# Patient Record
Sex: Male | Born: 1975 | Race: White | Hispanic: No | Marital: Single | Smoking: Current every day smoker
Health system: Southern US, Community
[De-identification: ages and names within clinical notes are randomized; demographics above are authoritative.]

## PROBLEM LIST (undated history)

## (undated) DIAGNOSIS — B1920 Unspecified viral hepatitis C without hepatic coma: Secondary | ICD-10-CM

## (undated) DIAGNOSIS — N280 Ischemia and infarction of kidney: Secondary | ICD-10-CM

## (undated) DIAGNOSIS — I7410 Embolism and thrombosis of unspecified parts of aorta: Secondary | ICD-10-CM

## (undated) DIAGNOSIS — I330 Acute and subacute infective endocarditis: Secondary | ICD-10-CM

## (undated) DIAGNOSIS — B1910 Unspecified viral hepatitis B without hepatic coma: Secondary | ICD-10-CM

## (undated) DIAGNOSIS — D735 Infarction of spleen: Secondary | ICD-10-CM

## (undated) DIAGNOSIS — B955 Unspecified streptococcus as the cause of diseases classified elsewhere: Secondary | ICD-10-CM

## (undated) HISTORY — PX: APPENDECTOMY: SHX54

---

## 2015-04-30 ENCOUNTER — Emergency Department (HOSPITAL_COMMUNITY)
Admission: EM | Admit: 2015-04-30 | Discharge: 2015-04-30 | Disposition: A | Discharge status: Home / Self Care | Payer: Medicaid Other | Attending: Emergency Medicine | Admitting: Emergency Medicine

## 2015-04-30 ENCOUNTER — Encounter (HOSPITAL_COMMUNITY): Payer: Self-pay | Admitting: Emergency Medicine

## 2015-04-30 DIAGNOSIS — Z720 Tobacco use: Secondary | ICD-10-CM | POA: Diagnosis not present

## 2015-04-30 DIAGNOSIS — S3992XD Unspecified injury of lower back, subsequent encounter: Secondary | ICD-10-CM | POA: Insufficient documentation

## 2015-04-30 DIAGNOSIS — M545 Low back pain: Secondary | ICD-10-CM

## 2015-04-30 DIAGNOSIS — G8929 Other chronic pain: Secondary | ICD-10-CM | POA: Insufficient documentation

## 2015-04-30 DIAGNOSIS — W1839XD Other fall on same level, subsequent encounter: Secondary | ICD-10-CM | POA: Insufficient documentation

## 2015-04-30 DIAGNOSIS — Z8619 Personal history of other infectious and parasitic diseases: Secondary | ICD-10-CM | POA: Insufficient documentation

## 2015-04-30 DIAGNOSIS — M5440 Lumbago with sciatica, unspecified side: Secondary | ICD-10-CM | POA: Diagnosis not present

## 2015-04-30 DIAGNOSIS — W19XXXD Unspecified fall, subsequent encounter: Secondary | ICD-10-CM

## 2015-04-30 DIAGNOSIS — M549 Dorsalgia, unspecified: Secondary | ICD-10-CM

## 2015-04-30 HISTORY — DX: Unspecified viral hepatitis C without hepatic coma: B19.20

## 2015-04-30 MED ORDER — IBUPROFEN 800 MG PO TABS: 800.0000 mg | ORAL_TABLET | Freq: Three times a day (TID) | ORAL | Status: DC | PRN

## 2015-04-30 MED ORDER — KETOROLAC TROMETHAMINE 60 MG/2ML IM SOLN
60.0000 mg | Freq: Once | INTRAMUSCULAR | Status: AC
  Administered 2015-04-30: 60 mg via INTRAMUSCULAR
  Filled 2015-04-30: qty 2

## 2015-04-30 MED ORDER — METHOCARBAMOL 500 MG PO TABS: 500.0000 mg | ORAL_TABLET | Freq: Four times a day (QID) | ORAL | Status: DC | PRN

## 2015-04-30 MED ORDER — NAPROXEN 500 MG PO TABS: 500.0000 mg | ORAL_TABLET | Freq: Two times a day (BID) | ORAL | Status: DC

## 2015-04-30 NOTE — ED Notes (Signed)
Pt c/o right lower back pain after falling down stairs; pt sts chronic issues x 6 months

## 2015-04-30 NOTE — ED Notes (Signed)
Declined W/C at D/C and was escorted to lobby by RN. 

## 2015-04-30 NOTE — ED Notes (Signed)
Pt informed the medication toradol burns and recommended Pt receive the injection in G/V location instead of arm. Pt stated he did not like needles in his arm. Once again PT was recommended to receive injection in V/G region. Pt agreed to receive injection in V/G area.

## 2015-04-30 NOTE — ED Notes (Signed)
Patient states mechanical fall on Thursday, but states has chronic back pain.   Patient states he has had non stop pain for 6 months.  Patient states he was seen at [facility] and he had lumbar xrays done then.   Patient states he received roxicodone there, but he still can't sleep.

## 2015-04-30 NOTE — Discharge Instructions (Signed)
Read the information below.  Use the prescribed medication as directed.  Please discuss all new medications with your pharmacist.  You may return to the Emergency Department at any time for worsening condition or any new symptoms that concern you.   Do not take additional ibuprofen while taking naproxen.   If you develop fevers, loss of control of bowel or bladder, weakness or numbness in your legs, or are unable to walk, return to the ER for a recheck.    Back Pain, Adult Low back pain is very common. About 1 in 5 people have back pain.The cause of low back pain is rarely dangerous. The pain often gets better over time.About half of people with a sudden onset of back pain feel better in just 2 weeks. About 8 in 10 people feel better by 6 weeks.  CAUSES Some common causes of back pain include:  Strain of the muscles or ligaments supporting the spine.  Wear and tear (degeneration) of the spinal discs.  Arthritis.  Direct injury to the back. DIAGNOSIS Most of the time, the direct cause of low back pain is not known.However, back pain can be treated effectively even when the exact cause of the pain is unknown.Answering your caregiver's questions about your overall health and symptoms is one of the most accurate ways to make sure the cause of your pain is not dangerous. If your caregiver needs more information, he or she may order lab work or imaging tests (X-rays or MRIs).However, even if imaging tests show changes in your back, this usually does not require surgery. HOME CARE INSTRUCTIONS For many people, back pain returns.Since low back pain is rarely dangerous, it is often a condition that people can learn to [facility] their own.   Remain active. It is stressful on the back to sit or stand in one place. Do not sit, drive, or stand in one place for more than 30 minutes at a time. Take short walks on level surfaces as soon as pain allows.Try to increase the length of time you walk each  day.  Do not stay in bed.Resting more than 1 or 2 days can delay your recovery.  Do not avoid exercise or work.Your body is made to move.It is not dangerous to be active, even though your back may hurt.Your back will likely heal faster if you return to being active before your pain is gone.  Pay attention to your body when you bend and lift. Many people have less discomfortwhen lifting if they bend their knees, keep the load close to their bodies,and avoid twisting. Often, the most comfortable positions are those that put less stress on your recovering back.  Find a comfortable position to sleep. Use a firm mattress and lie on your side with your knees slightly bent. If you lie on your back, put a pillow under your knees.  Only take over-the-counter or prescription medicines as directed by your caregiver. Over-the-counter medicines to reduce pain and inflammation are often the most helpful.Your caregiver may prescribe muscle relaxant drugs.These medicines help dull your pain so you can more quickly return to your normal activities and healthy exercise.  Put ice on the injured area.  Put ice in a plastic bag.  Place a towel between your skin and the bag.  Leave the ice on for 15-20 minutes, 03-04 times a day for the first 2 to 3 days. After that, ice and heat may be alternated to reduce pain and spasms.  Ask your caregiver about trying  back exercises and gentle massage. This may be of some benefit.  Avoid feeling anxious or stressed.Stress increases muscle tension and can worsen back pain.It is important to recognize when you are anxious or stressed and learn ways to manage it.Exercise is a great option. SEEK MEDICAL CARE IF:  You have pain that is not relieved with rest or medicine.  You have pain that does not improve in 1 week.  You have new symptoms.  You are generally not feeling well. SEEK IMMEDIATE MEDICAL CARE IF:   You have pain that radiates from your back into  your legs.  You develop new bowel or bladder control problems.  You have unusual weakness or numbness in your arms or legs.  You develop nausea or vomiting.  You develop abdominal pain.  You feel faint. Document Released: 08/18/2005 Document Revised: 02/17/2012 Document Reviewed: 12/20/2013 [facility] Patient Information 2015 [place], [place]. This information is not intended to replace advice given to you by your health care provider. Make sure you discuss any questions you have with your health care provider.    Emergency Department Resource Guide 1) Find a Doctor and Pay Out of Pocket Although you won't have to find out who is covered by your insurance plan, it is a good idea to ask around and get recommendations. You will then need to call the office and see if the doctor you have chosen will accept you as a new patient and what types of options they offer for patients who are self-pay. Some doctors offer discounts or will set up payment plans for their patients who do not have insurance, but you will need to ask so you aren't surprised when you get to your appointment.  2) Contact Your Local Health Department Not all health departments have doctors that can see patients for sick visits, but many do, so it is worth a call to see if yours does. If you don't know where your local health department is, you can check in your phone book. The CDC also has a tool to help you locate your state's health department, and many state websites also have listings of all of their local health departments.  3) Find a Walk-in Clinic If your illness is not likely to be very severe or complicated, you may want to try a walk in clinic. These are popping up all over the country in pharmacies, drugstores, and shopping centers. They're usually staffed by nurse practitioners or physician assistants that have been trained to treat common illnesses and complaints. They're usually fairly quick and inexpensive. However,  if you have serious medical issues or chronic medical problems, these are probably not your best option.  No Primary Care Doctor: - Call Health Connect at  [phone] - they can help you locate a primary care doctor that  accepts your insurance, provides certain services, etc. - Physician Referral Service- [phone]  Chronic Pain Problems: Organization         Address  Phone   Notes  [facility]  [phone] Patients need to be referred by their primary care doctor.   Medication Assistance: Organization         Address  Phone   Notes  [facility] Medication [facility] [address] [place] [place], [place] [address] [phone] --Must be a resident of [facility] -- Must have NO insurance coverage whatsoever (no Medicaid/ Medicare, etc.) -- The pt. MUST have a primary care doctor that directs their care regularly and  follows them in the community   [facility]ist  [phone]   [employer]  [phone]    Agencies that provide inexpensive medical care: Organization         Address  Phone   Notes  [facility]  [phone]   [facility]    [phone]   [facility] [address] [place], [place] [address] [phone]   [facility] [address] [place] [place] [phone]   Planned Parenthood    [phone]   [facility]    [phone]   [facility]  [address], [place] Phone:  [phone], Fax:  [phone] Hours of Operation:  9 am - 6 pm, M-F.  Also accepts Medicaid/Medicare and self-pay.  [facility]  [address], [place] Phone: [phone], Fax: [phone]. Hours of Operation:  8:30 am - 5:30 pm, M-F.  Also accepts Medicaid and self-pay.  [facility] [place] [address] [place] Phone: [phone]   [facility] [address] Natasha Bence [place], [place] [phone], Ext. [phone] Mondays & Thursdays: 7-9 AM.  First 15 patients are seen on a first come, first serve basis.    Medicaid-accepting [facility] Providers:  Organization         Address  Phone   Notes  [facility] [address], [place] [phone] Also accepts self-pay patients.  [facility] [address] Laurell Josephs [place], [place]  [phone]   [facility] [address], [place] [phone]   [facility] [address] [place] [phone]   Renaye Rakers [address], [place]   [phone] Only accepts [place] Access [place] patients after they have their name applied to their card.   Self-Pay (no insurance) in [facility]:  Organization         Address  Phone   Notes  Sickle Cell Patients, [facility] Internal [facility] [address] [place], [place] [phone]   [facility] [address] [place], [place] [phone]   [facility] [place]  [address], [place] [phone]   [facility]/Dr. Osei-Bonsu  [address], [place] or [address], [place] [phone] Phone number for both [place] and [place] locations is the same.  [facility] [address] [place] [phone]   [facility] [address] [place] or [address] [phone]   [facility] [address] [place] [phone], phone; [phone], fax Sees patients 1st and 3rd Saturday of every month.  Must not qualify for public or private insurance (i.e. Medicaid, Medicare, [place] Health Choice, Veterans' Benefits)  Household income should be no more than 200% of the poverty level The clinic cannot treat you if you are pregnant or think you are  pregnant  Sexually transmitted diseases are not treated at the clinic.    Dental Care: Organization         Address  Phone  Notes  [facility] Department of [facility] [address] [place], [place] [phone] Accepts children up to age 23 who are  enrolled in Medicaid or [place] Health Choice; pregnant women with a Medicaid card; and children who have applied for Medicaid or [place] Health Choice, but were declined, whose parents can pay a reduced fee at time of service.  [facility] of [facility]  [address], [place] [phone] Accepts children up to age 39 who are enrolled in [place] or [place] Health Choice; pregnant women with a Medicaid card; and children who have applied for Medicaid or [place] Health Choice, but were declined, whose parents can pay a reduced fee at time of service.  [facility]  [address] [place], [place] [phone] Patients are seen by appointment only. Walk-ins are not accepted. [facility] will see patients 32 years of age and older. Monday - Tuesday (8am-5pm) Most Wednesdays (8:30-5pm) $30 per visit, cash only  [facility] Adult Dental Access PROGRAM  [address], [facility] [phone] Patients are seen by appointment only. Walk-ins are not accepted. [facility] will see patients 5 years of age and older. One Wednesday Evening (Monthly: Volunteer Based).  $30 per visit, cash only  [employer]  [phone] for adults; Children under age 89, call Graduate Pediatric Dentistry at [phone]. Children aged 44-14, please call [phone] to request a pediatric application.  Dental services are provided in all areas of dental care including fillings, crowns and bridges, complete and partial dentures, implants, gum treatment, root canals, and extractions. Preventive care is also provided. Treatment is provided to both adults and  children. Patients are selected via a lottery and there is often a waiting list.   [facility] [address], [place]  [phone] [email or web address]   [facility] [address] [place], [place] [phone], Ext. [phone] Second and Fourth Thursday of each month, opens at 6:30 AM; Clinic ends at 9 AM.  Patients are seen on a first-come first-served basis, and a limited number are seen during each clinic.   [facility]  [address] Ether Griffins [place], [place] [phone]   Eligibility Requirements You must have lived in [place], [place], or [place] counties for at least the last three months.   You cannot be eligible for state or federal sponsored [place], including [employer], [place], or [employer].   You generally cannot be eligible for healthcare insurance through your employer.    How to apply: Eligibility screenings are held every Tuesday and Wednesday afternoon from 1:00 pm until 4:00 pm. You do not need an appointment for the interview!  [facility] [address] [place], [place] [phone]   [facility] Health Department  [phone]   [facility]  [phone]   [facility] Health Department  [phone]    Behavioral Health Resources in the Community: Intensive Outpatient Programs Organization         Address  Phone  Notes  [facility] Services [address] [place], [place] [phone]   [facility] Outpatient [address], [place], [place] [phone]   [facility] [address] [place], [place]  [phone]   [facility] Mental Health [address]  [place], [place] [phone] or [phone]   Substance Abuse Resources Organization         Address  Phone  Notes  [facility] Services  [phone]   [facility]  [phone]  [facility]  [phone]    Floydene Flock  [phone]   Residential & Outpatient Substance Abuse Program  [phone]   Psychological Services Organization         Address  Phone  Notes  [facility] Behavioral Health  [phone]   [facility] Services  [phone]   [facility] Mental Health [address] [place] [phone] or [phone]    Mobile Crisis Teams Organization         Address  Phone  Notes  [facility], Mobile Crisis Care Unit  [phone]   [facility]  [address] [place], [place] [phone]   Doristine Locks [address] [place] [phone]    Self-Help/Support Groups Organization         Address  Phone             Notes  Mental Health Assoc. of  - variety of support groups  336- I7437963 Call for more information  Narcotics Anonymous (NA), Caring [facility] [address], [employer] Social Circle  2 meetings at this location   Statistician         Address  Phone  Notes  [facility] [address],    [place]  [phone]   [facility]  [address] [place] [address], [place], [place] [phone]   [facility] [address] [place], [place] [phone] Admissions: 8am-3pm M-F  [facility] [address],    [place], [place] [phone]   [facility] [address] [place],  [place], [place] [phone]   [facility] [address]  [place], [place] [phone]   [facility] - Intensive Outpatient [address]ure[address], [place], [place] [phone]   [facility] (Addiction Recovery Care [facility]oc.) [address] [place]  [place], [place] [phone] or [phone]   [facility] ([facility]) [address] [place], [place] [phone] Accepts Medicaid  [employer] [address]  [place] [phone] Substance Abuse/Addiction Treatment   [facility] Organization         Address  Phone  Notes  [facility]  [phone]   Angie Fava, PhD [address] Ervin Knack [employer], [place]   [phone] or 6696083858   [facility] Behavioral   [address] [place], [place] [phone]   [facility] [address], [place], [place] [phone] Insurance/Medicaid/sponsorship through [facility] [address]                                    [place], [place] [phone] Therapy/tele-psych/case  [facility] [address] [place], [place] [phone]    Dr. Lolly Mustache  [phone]   [facility]  [employer] [facility] Dept. 1) [address] [place] 2) [address], [place] 3)  [address], [place] [phone]  [phone]   [facility] Child Abuse Hotline 828-342-6656 or [phone] (After Hours)

## 2015-04-30 NOTE — ED Notes (Signed)
AS Pt was  Leaving the exam room Pt reports I feel worse than when I came in. Pt then reports I did not know that shot would burn. I feel swelling where I got the med. . On observation no swelling or change in color at injection site.

## 2015-04-30 NOTE — ED Provider Notes (Signed)
CSN: [id number]     Arrival date & time 04/30/15  1348 History  This chart was scribed for non-physician practitioner Trixie Dredge, PA-C working with Marily Memos, MD by Leone Payor, ED Scribe. This patient was seen in room TR06C/TR06C and the patient's care was started at 3:13 PM.    Chief Complaint  Patient presents with  . Back Pain   The history is provided by the patient and the spouse. No language interpreter was used.     HPI Comments: Marcus [name] is a 39 y.o. male who presents to the Emergency Department complaining of 6 months of chronic, constant right lower back pain that worsened after a fall 3 days ago. He reports a slip and fall, striking his lower back on the steps and falling down 5-6 steps. He denies LOC or head injury during the fall. He reports having a back injury in February 2016 while snowboarding which initially caused his back pain. He also reports intermittent numbness to lower extremities since the initial injury in February. He describes the pain as sharp and shooting with certain movements. He has been taking ibuprofen at home without relief. He was seen in the [place] ED 04/28/15 and had a negative lumbar xray; he was diagnosed with a contusion, given oxycodone and flexeril in the ED and discharged home with oxycodone 5 mg. He denies weakness.    Past Medical History  Diagnosis Date  . Hepatitis C    History reviewed. No pertinent past surgical history. History reviewed. No pertinent family history. Social History  Substance Use Topics  . Smoking status: Current Every Day Smoker  . Smokeless tobacco: None  . Alcohol Use: Yes    Review of Systems  Constitutional: Negative for fever.  Cardiovascular: Negative for leg swelling.  Musculoskeletal: Positive for back pain. Negative for gait problem and neck pain.  Skin: Negative for color change and wound.  Neurological: Positive for numbness. Negative for syncope, weakness and headaches.  Hematological: Does  not bruise/bleed easily.  Psychiatric/Behavioral: Negative for self-injury (accidental).   Allergies  Tylenol  Home Medications   Prior to Admission medications   Not on File   BP 120/71 mmHg  Pulse 98  Temp(Src) 98.8 F (37.1 C) (Oral)  Resp 18  SpO2 99% Physical Exam  Constitutional: He appears well-developed and well-nourished. No distress.  HENT:  Head: Normocephalic and atraumatic.  Neck: Neck supple.  Pulmonary/Chest: Effort normal.  Abdominal: Soft. He exhibits no distension. There is no tenderness. There is no rebound and no guarding.  Musculoskeletal: He exhibits tenderness.  Tenderness to lumbar spine and tenderness to right lumbar paraspinal muscles. SLR + No crepitus, or stepoffs. Lower extremities:  Strength 5/5, sensation intact, distal pulses intact.     Neurological: He is alert.  Skin: He is not diaphoretic.  Nursing note and vitals reviewed.   ED Course  Procedures (including critical care time)  DIAGNOSTIC STUDIES: Oxygen Saturation is 99% on RA, normal by my interpretation.    COORDINATION OF CARE: 3:20 PM Discussed treatment plan with pt at bedside and pt agreed to plan.   Labs Review Labs Reviewed - No data to display  Imaging Review No results found. I have personally reviewed and evaluated these images and lab results as part of my medical decision-making.   EKG Interpretation None      MDM   Final diagnoses:  Right low back pain, with sciatica presence unspecified  Fall, subsequent encounter  Chronic back pain greater than 3 months duration  Afebrile, nontoxic patient with exacerbation of chronic back pain after fall a few days ago.  No red flags with history or exam.  Neurovascularly intact.  Xray performed two days ago after fall.  Emergent imaging not indicated at this time.  Pt prescribed oxycodone by outside ED two days ago.  D/C home with motrin, robaxin, neurosurgery follow up.  PCP follow up.  Discussed result, findings,  treatment, and follow up  with patient.  Pt given return precautions.  Pt verbalizes understanding and agrees with plan.        I personally performed the services described in this documentation, which was scribed in my presence. The recorded information has been reviewed and is accurate.   Trixie Dredge, PA-C 04/30/15 1725  Marily Memos, MD 05/03/15 [phone]

## 2015-05-23 ENCOUNTER — Ambulatory Visit: Payer: Medicaid Other | Admitting: Rehabilitation

## 2016-07-02 ENCOUNTER — Encounter (HOSPITAL_COMMUNITY): Payer: Self-pay | Admitting: *Deleted

## 2016-07-02 ENCOUNTER — Inpatient Hospital Stay (HOSPITAL_COMMUNITY)
Admission: RE | Admit: 2016-07-02 | Discharge: 2016-07-09 | DRG: 881 | Disposition: A | Discharge status: Home / Self Care | Payer: Medicaid Other | Attending: Psychiatry | Admitting: Psychiatry

## 2016-07-02 ENCOUNTER — Encounter (HOSPITAL_COMMUNITY): Payer: Self-pay

## 2016-07-02 ENCOUNTER — Emergency Department (HOSPITAL_COMMUNITY)
Admission: EM | Admit: 2016-07-02 | Discharge: 2016-07-02 | Disposition: A | Discharge status: Other Acute Inpatient Hospital | Payer: Medicaid Other | Attending: Emergency Medicine | Admitting: Emergency Medicine

## 2016-07-02 ENCOUNTER — Encounter (HOSPITAL_COMMUNITY): Payer: Self-pay | Admitting: Behavioral Health

## 2016-07-02 DIAGNOSIS — F419 Anxiety disorder, unspecified: Secondary | ICD-10-CM | POA: Diagnosis present

## 2016-07-02 DIAGNOSIS — F1920 Other psychoactive substance dependence, uncomplicated: Secondary | ICD-10-CM | POA: Diagnosis present

## 2016-07-02 DIAGNOSIS — F17210 Nicotine dependence, cigarettes, uncomplicated: Secondary | ICD-10-CM | POA: Diagnosis not present

## 2016-07-02 DIAGNOSIS — G8929 Other chronic pain: Secondary | ICD-10-CM | POA: Diagnosis present

## 2016-07-02 DIAGNOSIS — F17200 Nicotine dependence, unspecified, uncomplicated: Secondary | ICD-10-CM | POA: Diagnosis present

## 2016-07-02 DIAGNOSIS — R110 Nausea: Secondary | ICD-10-CM

## 2016-07-02 DIAGNOSIS — Z79899 Other long term (current) drug therapy: Secondary | ICD-10-CM | POA: Diagnosis not present

## 2016-07-02 DIAGNOSIS — F329 Major depressive disorder, single episode, unspecified: Secondary | ICD-10-CM | POA: Diagnosis present

## 2016-07-02 DIAGNOSIS — R197 Diarrhea, unspecified: Secondary | ICD-10-CM | POA: Diagnosis present

## 2016-07-02 DIAGNOSIS — Z5181 Encounter for therapeutic drug level monitoring: Secondary | ICD-10-CM | POA: Insufficient documentation

## 2016-07-02 DIAGNOSIS — F1120 Opioid dependence, uncomplicated: Secondary | ICD-10-CM | POA: Diagnosis present

## 2016-07-02 DIAGNOSIS — F1590 Other stimulant use, unspecified, uncomplicated: Secondary | ICD-10-CM | POA: Diagnosis present

## 2016-07-02 DIAGNOSIS — R1110 Vomiting, unspecified: Secondary | ICD-10-CM

## 2016-07-02 DIAGNOSIS — L299 Pruritus, unspecified: Secondary | ICD-10-CM | POA: Diagnosis present

## 2016-07-02 DIAGNOSIS — R45851 Suicidal ideations: Secondary | ICD-10-CM | POA: Diagnosis present

## 2016-07-02 DIAGNOSIS — Z23 Encounter for immunization: Secondary | ICD-10-CM | POA: Diagnosis not present

## 2016-07-02 DIAGNOSIS — R112 Nausea with vomiting, unspecified: Secondary | ICD-10-CM | POA: Insufficient documentation

## 2016-07-02 DIAGNOSIS — B1910 Unspecified viral hepatitis B without hepatic coma: Secondary | ICD-10-CM | POA: Diagnosis present

## 2016-07-02 DIAGNOSIS — F11220 Opioid dependence with intoxication, uncomplicated: Secondary | ICD-10-CM | POA: Diagnosis not present

## 2016-07-02 LAB — COMPREHENSIVE METABOLIC PANEL
ALT: 26 U/L (ref 17–63)
AST: 21 U/L (ref 15–41)
Albumin: 3.7 g/dL (ref 3.5–5.0)
Alkaline Phosphatase: 69 U/L (ref 38–126)
Anion gap: 5 (ref 5–15)
BILIRUBIN TOTAL: 0.6 mg/dL (ref 0.3–1.2)
BUN: 7 mg/dL (ref 6–20)
CHLORIDE: 110 mmol/L (ref 101–111)
CO2: 24 mmol/L (ref 22–32)
Calcium: 8.8 mg/dL — ABNORMAL LOW (ref 8.9–10.3)
Creatinine, Ser: 0.8 mg/dL (ref 0.61–1.24)
Glucose, Bld: 94 mg/dL (ref 65–99)
POTASSIUM: 3.9 mmol/L (ref 3.5–5.1)
Sodium: 139 mmol/L (ref 135–145)
TOTAL PROTEIN: 6.8 g/dL (ref 6.5–8.1)

## 2016-07-02 LAB — RAPID URINE DRUG SCREEN, HOSP PERFORMED
Amphetamines: NOT DETECTED
Barbiturates: NOT DETECTED
Benzodiazepines: NOT DETECTED
Cocaine: NOT DETECTED
OPIATES: NOT DETECTED
Tetrahydrocannabinol: POSITIVE — AB

## 2016-07-02 LAB — CBC
HCT: 40.7 % (ref 39.0–52.0)
Hemoglobin: 13.7 g/dL (ref 13.0–17.0)
MCH: 30.6 pg (ref 26.0–34.0)
MCHC: 33.7 g/dL (ref 30.0–36.0)
MCV: 90.8 fL (ref 78.0–100.0)
PLATELETS: 282 10*3/uL (ref 150–400)
RBC: 4.48 MIL/uL (ref 4.22–5.81)
RDW: 13.6 % (ref 11.5–15.5)
WBC: 10.4 10*3/uL (ref 4.0–10.5)

## 2016-07-02 LAB — ETHANOL

## 2016-07-02 LAB — SALICYLATE LEVEL

## 2016-07-02 LAB — ACETAMINOPHEN LEVEL: Acetaminophen (Tylenol), Serum: <10 ug/mL — ABNORMAL LOW (ref 10–30)

## 2016-07-02 MED ORDER — ONDANSETRON HCL 4 MG PO TABS: 4.0000 mg | ORAL_TABLET | Freq: Four times a day (QID) | ORAL | 0 refills | Status: DC

## 2016-07-02 MED ORDER — HYDROXYZINE HCL 25 MG PO TABS: 25.0000 mg | ORAL_TABLET | Freq: Four times a day (QID) | ORAL | Status: DC | PRN

## 2016-07-02 MED ORDER — TRAZODONE HCL 50 MG PO TABS
50.0000 mg | ORAL_TABLET | Freq: Every evening | ORAL | Status: DC | PRN
  Administered 2016-07-02 – 2016-07-08 (×7): 50 mg via ORAL
  Filled 2016-07-02 (×2): qty 1
  Filled 2016-07-02: qty 21
  Filled 2016-07-02 (×5): qty 1

## 2016-07-02 MED ORDER — MAGNESIUM HYDROXIDE 400 MG/5ML PO SUSP: 30.0000 mL | Freq: Every day | ORAL | Status: DC | PRN

## 2016-07-02 MED ORDER — ALUM & MAG HYDROXIDE-SIMETH 200-200-20 MG/5ML PO SUSP
30.0000 mL | ORAL | Status: DC | PRN
  Administered 2016-07-04 (×2): 30 mL via ORAL
  Filled 2016-07-02 (×2): qty 30

## 2016-07-02 MED ORDER — NICOTINE POLACRILEX 2 MG MT GUM
2.0000 mg | CHEWING_GUM | OROMUCOSAL | Status: DC | PRN
  Administered 2016-07-02: 2 mg via ORAL

## 2016-07-02 NOTE — ED Notes (Signed)
EMTALA completed, [facility].  [place] here to transport patient.

## 2016-07-02 NOTE — B[facility] Counselor (Signed)
[facility]  Accepting is L. Arville CareParks, NP; attending is Dr. Mckinley Jewelates.

## 2016-07-02 NOTE — ED Triage Notes (Signed)
[facility]. Pt complains of abdominal pain and diarrhea for the past 48 hours. Pt states he has been eating food out of cans that were "questionable".   [facility] suicidal ideation that became worse recently since being laid off recently.

## 2016-07-02 NOTE — ED Notes (Signed)
Bed: WA27 Expected date:  Expected time:  Means of arrival:  Comments: 

## 2016-07-02 NOTE — Discharge Instructions (Signed)
There does not appear to be an emergent cause for your symptoms at this time. Your exam, labs are very reassuring. Please remember to drink at least 8, 8 ounce glasses of water per day or other electrolyte balance solution. You may take your Zofran as needed for nausea. Follow-up with your doctor next week as needed. Return to ED for any new or worsening symptoms as we discussed
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
## 2016-07-02 NOTE — H&P (Signed)
Behavioral Health Medical Screening Exam  Marcus [name] is an 40 y.o. male.  Total Time spent with patient: 20 minutes  Psychiatric Specialty Exam: Physical Exam  Constitutional: He is oriented to person, place, and time. He appears well-developed and well-nourished.  HENT:  Head: Normocephalic.  Eyes: Pupils are equal, round, and reactive to light.  Neck: Normal range of motion.  Cardiovascular: Normal rate, regular rhythm and normal heart sounds.   Respiratory: Effort normal and breath sounds normal.  GI: Soft. Bowel sounds are normal.  Musculoskeletal: Normal range of motion.  Neurological: He is alert and oriented to person, place, and time.  Skin: Skin is warm and dry.    Review of Systems  Psychiatric/Behavioral: Positive for depression, hallucinations, substance abuse (methamphetamines, heroin) and suicidal ideas. Negative for memory loss. The patient is nervous/anxious and has insomnia.     BP 121/78 (BP Location: Left Arm)   Pulse 100   Temp 98.5 F (36.9 C) (Oral)   Resp 18   SpO2 100%    General Appearance: Disheveled  Eye Contact:  Good  Speech:  Clear and Coherent and Normal Rate  Volume:  Decreased  Mood:  Anxious, Depressed, Hopeless and Worthless  Affect:  Congruent, Depressed and Flat  Thought Process:  Coherent, Goal Directed and Linear  Orientation:  Full (Time, Place, and Person)  Thought Content:  Logical and Hallucinations: Auditory Visual hears people talking to each other, see shadows  Suicidal Thoughts:  Yes.  without intent/plan two weeks ago formulated a plan to jump off a bridge    Homicidal Thoughts:  No  Memory:  Immediate;   Good Recent;   Fair Remote;   Fair  Judgement:  Fair  Insight:  Good  Psychomotor Activity:  Normal  Concentration: Concentration: Good and Attention Span: Good  Recall:  Good  Fund of Knowledge:Good  Language: Good  Akathisia:  No  Handed:  Right  AIMS (if indicated):     Assets:  Communication Skills Desire  for Improvement Financial Resources/Insurance Physical Health Resilience Vocational/Educational  Sleep:       Musculoskeletal: Strength & Muscle Tone: within normal limits Gait & Station: normal Patient leans: N/A BP 121/78 (BP Location: Left Arm)   Pulse 100   Temp 98.5 F (36.9 C) (Oral)   Resp 18   SpO2 100%    Recommendations:  Based on my evaluation the patient appears to have an emergency medical condition for which I recommend the patient be transferred to the emergency department for further evaluation.    Laveda AbbeLaurie Britton Parks, NP 07/02/2016, 2:53 PM

## 2016-07-02 NOTE — ED Notes (Signed)
Pt signed Consent for Transfer.

## 2016-07-02 NOTE — ED Notes (Signed)
Pelahm here [facility].  1 personal belonging bag given to Pelham.

## 2016-07-02 NOTE — ED Notes (Signed)
Bed: [id number] Expected date:  Expected time:  Means of arrival:  Comments: [facility]

## 2016-07-02 NOTE — ED Provider Notes (Signed)
[facility]-EMERGENCY DEPT Provider Note   CSN: [id number] Arrival date & time: 07/02/16  1511     History   Chief Complaint Chief Complaint  Patient presents with  . Suicidal  . Abdominal Pain  . Diarrhea    HPI Marcus [name] is a 40 y.o. male.  HPI patient here from behavioral health for evaluation of abdominal discomfort with nausea, vomiting and diarrhea. Patient reports he checked into behavioral yesterday for suicidal ideations. He denies any suicidal or homicidal ideations now, since he is feeling much better. No auditory or visual hallucinations. He reports intermittently over the past 3 months he has had intense nausea, vomiting with diarrhea. Most recent episode started 48 hours ago after eating "some questionable can food". He denies any fevers, chills, urinary symptoms, recent hospitalizations, travel or antibiotic use. No nausea or vomiting now.  Past Medical History:  Diagnosis Date  . Hepatitis C     There are no active problems to display for this patient.   History reviewed. No pertinent surgical history.     Home Medications    Prior to Admission medications   Not on File    Family History No family history on file.  Social History Social History  Substance Use Topics  . Smoking status: Current Every Day Smoker  . Smokeless tobacco: Never Used  . Alcohol use No     Allergies   Bee venom; Grapefruit oil; Naproxen; and Tylenol [acetaminophen]   Review of Systems Review of Systems A 10 point review of systems was completed and was negative except for pertinent positives and negatives as mentioned in the history of present illness    Physical Exam Updated Vital Signs BP 122/77 (BP Location: Right Arm)   Pulse 87   Temp 98.4 F (36.9 C) (Oral)   Resp 18   SpO2 99%   Physical Exam  Constitutional: He appears well-developed. No distress.  Awake, alert and nontoxic in appearance  HENT:  Head: Normocephalic and atraumatic.  Right Ear:  External ear normal.  Left Ear: External ear normal.  Mouth/Throat: Oropharynx is clear and moist.  Eyes: Conjunctivae and EOM are normal. Pupils are equal, round, and reactive to light.  Neck: Normal range of motion. No JVD present.  Cardiovascular: Normal rate, regular rhythm and normal heart sounds.   Pulmonary/Chest: Effort normal and breath sounds normal. No stridor.  Abdominal: Soft. There is no tenderness.  Musculoskeletal: Normal range of motion.  Neurological:  Awake, alert, cooperative and aware of situation; motor strength bilaterally; sensation normal to light touch bilaterally; no facial asymmetry; tongue midline; major cranial nerves appear intact;  baseline gait without new ataxia.  Skin: No rash noted. He is not diaphoretic.  Psychiatric: He has a normal mood and affect. His behavior is normal. Thought content normal.  Nursing note and vitals reviewed.    ED Treatments / Results  Labs (all labs ordered are listed, but only abnormal results are displayed) Labs Reviewed  COMPREHENSIVE METABOLIC PANEL  ETHANOL  SALICYLATE LEVEL  ACETAMINOPHEN LEVEL  CBC  RAPID URINE DRUG SCREEN, HOSP PERFORMED    EKG  EKG Interpretation None       Radiology No results found.  Procedures Procedures (including critical care time)  Medications Ordered in ED Medications - No data to display   Initial Impression / Assessment and Plan / ED Course  I have reviewed the triage vital signs and the nursing notes.  Pertinent labs & imaging results that were available during my care of the  patient were reviewed by me and considered in my medical decision making (see chart for details).  Clinical Course    Patient with nonspecific nausea, vomiting and diarrhea. No risk factors for C. difficile. Exam was reassuring. He is hemodynamically stable and afebrile. Overall appears well. Pending screening labs. Anticipate discharge with prescription for Zofran Discussed aggressive oral  rehydration at home. Return precautions discussed. Upon discharge, patient reports he is suicidal to nursing staff. Maintains no plan. TTS consulted. Patient is medically cleared for their evaluation. Disposition per their recommendations.  Final Clinical Impressions(s) / ED Diagnoses   Final diagnoses:  Nausea  Diarrhea, unspecified type  Non-intractable vomiting, presence of nausea not specified, unspecified vomiting type  Suicidal ideation    New Prescriptions New Prescriptions   No medications on file     Joycie PeekBenjamin Jarrett Albor, PA-C 07/02/16 1942    Rolland PorterMark James, MD 07/12/16 [phone]

## 2016-07-02 NOTE — BH Assessment (Signed)
Assessment Note  Marcus [name] is a 40 y.o. male who presented as a voluntary walk-in to [facility] with complaint of suicidal ideation and other depressive symptoms.  Pt reported as follows:  He stated that recently he has been in 'a downward spiral' of substance use (most recently heroin, meth, opioids, but "I've used everything") and depression.  He endorsed suicidal ideation without current plan (but recently had a plan to jump off a bridge), despondency, decreased grooming and staying in bed, isolation, anxiety, fair to poor sleep, and a history of auditory hallucination (hearing voices when he is alone) and visual hallucination (seeing shadows on the wall).  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  Pt endorsed IV use.  Pt also endorsed significant psychosocial stressors, including loss of work about a week ago, homelessness, and social isolation.  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  Pt said he is positive for Hepatitis C and has stomach issues.  During assessment, Pt was calm and cooperative.  He had good eye contact.  Pt was dressed in street clothes and appeared somewhat disheveled.  Pt endorsed suicidal ideation and other depressive symptoms (see above).  Pt also endorsed ongoing substance use (denied current withdrawal symptoms).  Pt denied homicidal ideation or self-injury.  Pt's speech was normal in rate, rhythm, and volume.  Memory and concentration were intact.  Thought processes were within normal limits; thought content was goal-oriented.  Insight and judgment were fair; impulse control was poor as evidenced by ongoing substance use and suicidal ideation.  Consulted with Irving Burton, NP, who recommended Pt be treated inpatient.  Pt to be sent to [facility] for medical clearance.   Diagnosis: Major Depressive Disorder, Recurrent, Severe; Polysubstance Use  Disorder; r/o Substance-induced mood disorder  Past Medical History:  Past Medical History:  Diagnosis Date  . Hepatitis C     No past surgical history on file.  Family History: No family history on file.  Social History:  reports that he has been smoking.  He does not have any smokeless tobacco history on file. He reports that he uses drugs, including Methamphetamines and Heroin, about 7 times per week. He reports that he does not drink alcohol.  Additional Social History:  Alcohol / Drug Use Pain Medications: See PTA Prescriptions: See PTA Over the Counter: See PTA History of alcohol / drug use?: Yes Substance #1 Name of Substance 1: Meth 1 - Frequency: Daily 1 - Duration: Ongoing 1 - Last Use / Amount: 06/23/16 Substance #2 Name of Substance 2: Heroin 2 - Amount (size/oz): Varied 2 - Frequency: Daily where possible 2 - Duration: Ongoing 2 - Last Use / Amount: 06/01/16 Substance #3 Name of Substance 3: Opioids  CIWA:   COWS:    Allergies:  Allergies  Allergen Reactions  . Naproxen Nausea And Vomiting    PT refused to take med .  Marland Kitchen Tylenol [Acetaminophen]     Home Medications:  (Not in a hospital admission)  OB/GYN Status:  No LMP for male patient.  General Assessment Data Location of Assessment: [facility] Assessment Services TTS Assessment: In system Is this a Tele or Face-to-Face Assessment?: Face-to-Face Is this an Initial Assessment or a Re-assessment for this encounter?: Initial Assessment Marital status: Single Is patient pregnant?: No Pregnancy Status: No Living Arrangements: Alone (No fixed address) Can pt return to current  living arrangement?: Yes Admission Status: Voluntary Is patient capable of signing voluntary admission?: Yes Referral Source: Self/Family/Friend  Medical Screening Exam [facility] Walk[facility] ONLY) Medical Exam completed: Yes  Crisis Care Plan Living Arrangements: Alone (No fixed address) Name of Psychiatrist: None Name of Therapist:  None  Education Status Is patient currently in school?: No  Risk to self with the past 6 months Suicidal Ideation: Yes-Currently Present Has patient been a risk to self within the past 6 months prior to admission? : No Suicidal Intent: No Has patient had any suicidal intent within the past 6 months prior to admission? : No Is patient at risk for suicide?: Yes Suicidal Plan?: No Has patient had any suicidal plan within the past 6 months prior to admission? : No Access to Means: No What has been your use of drugs/alcohol within the last 12 months?: Methamphetamine, heroin, opioids ("I've used everything") Previous Attempts/Gestures: No (But Pt endorsed very reckless behavior w/o caring if lived) Other Self Harm Risks: Drug use Intentional Self Injurious Behavior: None Family Suicide History: Unknown Recent stressful life event(s): Other (Comment) (Kicked out of home, fired) Persecutory voices/beliefs?: No Depression: Yes Depression Symptoms: Despondent, Insomnia, Isolating, Fatigue, Feeling worthless/self pity Substance abuse history and/or treatment for substance abuse?: Yes Suicide prevention information given to non-admitted patients: Not applicable  Risk to Others within the past 6 months Homicidal Ideation: No Does patient have any lifetime risk of violence toward others beyond the six months prior to admission? : No Thoughts of Harm to Others: No Current Homicidal Intent: No Current Homicidal Plan: No Access to Homicidal Means: No History of harm to others?: No Assessment of Violence: None Noted Does patient have access to weapons?: No Criminal Charges Pending?: No Does patient have a court date: No Is patient on probation?: Unknown  Psychosis Hallucinations: Auditory, Visual (Pt endorsed hx of auditory/visual hallucination) Delusions: None noted  Mental Status Report Appearance/Hygiene: Disheveled, Poor hygiene, Other (Comment) (Street clothes) Eye Contact:  Good Motor Activity: Freedom of movement, Unremarkable Speech: Unremarkable Level of Consciousness: Alert Mood: Sad Affect: Blunted Anxiety Level: Moderate Thought Processes: Coherent, Circumstantial Judgement: Partial Orientation: Person, Place, Time, Situation Obsessive Compulsive Thoughts/Behaviors: None  Cognitive Functioning Concentration: Good Memory: Remote Intact, Recent Intact IQ: Average Insight: Fair Impulse Control: Poor Appetite: Fair Sleep: Decreased Total Hours of Sleep: 4 Vegetative Symptoms: Staying in bed, Decreased grooming  ADLScreening [facility] Assessment Services) Patient's cognitive ability adequate to safely complete daily activities?: Yes Patient able to express need for assistance with ADLs?: Yes Independently performs ADLs?: Yes (appropriate for developmental age)  Prior Inpatient Therapy Prior Inpatient Therapy: No  Prior Outpatient Therapy Prior Outpatient Therapy: No Does patient have an ACCT team?: No Does patient have Intensive In-House Services?  : No Does patient have [facility] services? : No Does patient have P4CC services?: No  ADL Screening (condition at time of admission) Patient's cognitive ability adequate to safely complete daily activities?: Yes Is the patient deaf or have difficulty hearing?: No Does the patient have difficulty seeing, even when wearing glasses/contacts?: No Does the patient have difficulty concentrating, remembering, or making decisions?: No Patient able to express need for assistance with ADLs?: Yes Does the patient have difficulty dressing or bathing?: No Independently performs ADLs?: Yes (appropriate for developmental age) Does the patient have difficulty walking or climbing stairs?: No Weakness of Legs: None Weakness of Arms/Hands: None  Home Assistive Devices/Equipment Home Assistive Devices/Equipment: None  Therapy Consults (therapy consults require a physician order) PT Evaluation Needed: No OT  Evalulation Needed: No SLP Evaluation Needed: No Abuse/Neglect Assessment (Assessment to be complete while patient is alone) Physical Abuse: Denies Verbal Abuse: Denies Sexual Abuse: Denies Exploitation of patient/patient's resources: Denies Self-Neglect: Denies Values / Beliefs Cultural Requests During Hospitalization: None Spiritual Requests During Hospitalization: None Consults Spiritual Care Consult Needed: No Social Work Consult Needed: No Merchant navy officerAdvance Directives (For Healthcare) Does patient have an advance directive?: No Would patient like information on creating an advanced directive?: No - patient declined information    Additional Information 1:1 In Past 12 Months?: No CIRT Risk: No Elopement Risk: No Does patient have medical clearance?: Yes     Disposition:  Disposition Initial Assessment Completed for this Encounter: Yes Disposition of Patient: Inpatient treatment program Type of inpatient treatment program: Adult (Per L. Arville CareParks, NP, Pt meets inpt criteria pending med clearan)  On Site Evaluation by:   Reviewed with Physician:    Dorris FetchEugene T Sunita Demond 07/02/2016 2:49 PM

## 2016-07-02 NOTE — Tx Team (Signed)
Initial Treatment Plan 07/02/2016 10:21 PM Marcus ReidDana [name] ZOX:[id number]    PATIENT STRESSORS: Financial difficulties Marital or family conflict Occupational concerns Substance abuse   PATIENT STRENGTHS: Ability for insight Communication skills Motivation for treatment/growth Supportive family/friends   PATIENT IDENTIFIED PROBLEMS: At risk for suicide  Substance Abuse  "Social skills"  "My health"               DISCHARGE CRITERIA:  Ability to meet basic life and health needs Improved stabilization in mood, thinking, and/or behavior Motivation to continue treatment in a less acute level of care Need for constant or close observation no longer present Withdrawal symptoms are absent or subacute and managed without 24-hour nursing intervention  PRELIMINARY DISCHARGE PLAN: Attend 12-step recovery group Outpatient therapy  PATIENT/FAMILY INVOLVEMENT: This treatment plan has been presented to and reviewed with the patient, Marcus [name].  The patient and family have been given the opportunity to ask questions and make suggestions.  Carleene OverlieMiddleton, Simone Tuckey P, RN 07/02/2016, 10:21 PM

## 2016-07-03 MED ORDER — HYDROXYZINE HCL 25 MG PO TABS
25.0000 mg | ORAL_TABLET | ORAL | Status: DC | PRN
  Administered 2016-07-03 – 2016-07-05 (×6): 25 mg via ORAL
  Filled 2016-07-03 (×6): qty 1

## 2016-07-03 MED ORDER — NICOTINE 21 MG/24HR TD PT24
21.0000 mg | MEDICATED_PATCH | Freq: Every day | TRANSDERMAL | Status: DC
  Administered 2016-07-03 – 2016-07-09 (×7): 21 mg via TRANSDERMAL
  Filled 2016-07-03 (×8): qty 1

## 2016-07-03 MED ORDER — IBUPROFEN 400 MG PO TABS
400.0000 mg | ORAL_TABLET | Freq: Four times a day (QID) | ORAL | Status: DC | PRN
  Administered 2016-07-03 – 2016-07-09 (×14): 400 mg via ORAL
  Filled 2016-07-03 (×13): qty 1

## 2016-07-03 MED ORDER — INFLUENZA VAC SPLIT QUAD 0.5 ML IM SUSY
0.5000 mL | PREFILLED_SYRINGE | INTRAMUSCULAR | Status: AC
  Administered 2016-07-06: 0.5 mL via INTRAMUSCULAR
  Filled 2016-07-03: qty 0.5

## 2016-07-03 NOTE — Progress Notes (Signed)
Patient ID: Marcus ReidDana Nethery, male   DOB: July 24, 1976, 40 y.o.   MRN: [id number]  Pt currently presents with a flat affect and cooperative behavior. Pt reports to writer that their goal is to "go to bed early." Pt attends group tonight. Pt reports good sleep with current medication regimen. Pt states that he has had increased anxiety today but that the medications have been effective.   Pt provided with medications per providers orders. Pt's labs and vitals were monitored throughout the night. Pt supported emotionally and encouraged to express concerns and questions. Pt educated on medications and anxiety.   Pt's safety ensured with 15 minute and environmental checks. Pt endorses some AVH today, does not specify. Pt currently denies SI/HI. Pt verbally agrees to seek staff if SI/HI occurs and to consult with staff before acting on any harmful thoughts. Will continue POC.

## 2016-07-03 NOTE — Progress Notes (Signed)
DAR NOTE: Pt present with flat affect and depressed mood in the unit. Pt has been isolating himself and has been bed most of the time. Pt complained of headache, took all his meds as scheduled. As per self inventory, pt had a good night sleep, good appetite, low energy, and good concentration. Pt rate depression at 6, hopeless ness at 4, and anxiety a 5. Pt's safety ensured with 15 minute and environmental checks. Pt currently denies SI/HI and A/V hallucinations. Pt verbally agrees to seek staff if SI/HI or A/VH occurs and to consult with staff before acting on these thoughts. Will continue POC.

## 2016-07-03 NOTE — [facility] Group Notes (Signed)
[facility] LCSW Group Therapy  07/03/2016 3:48 PM  Type of Therapy:  Group Therapy  Participation Level:  Active  Participation Quality:  Attentive  Affect:  Blunted  Cognitive:  Alert  Insight:  Engaged  Engagement in Therapy:  Engaged  Modes of Intervention:  Confrontation, Discussion, Education, Exploration, Problem-solving, Rapport Building, Socialization and Support  Summary of Progress/Problems: Emotion Regulation: This group focused on both positive and negative emotion identification and allowed group members to process ways to identify feelings, regulate negative emotions, and find healthy ways to manage internal/external emotions. Group members were asked to reflect on a time when their reaction to an emotion led to a negative outcome and explored how alternative responses using emotion regulation would have benefited them. Group members were also asked to discuss a time when emotion regulation was utilized when a negative emotion was experienced. Marcus [name] was attentive and engaged during today's processing group.He shared that he struggles with anxiety. "I use drugs to help me cope with it but I've lost so much in the that process." Marcus [name] shared that he wants a combination of effective coping skills and medication to help.   Ledell PeoplesHeather N Smart 07/03/2016, 3:48 PM
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
## 2016-07-03 NOTE — Tx Team (Signed)
Interdisciplinary Treatment and Diagnostic Plan Update  07/03/2016 Time of Session: 9:30AM Lorrene ReidDana Penafiel MRN: [id number]  Principal Diagnosis: Polysubstance dependence Secondary Diagnoses: Active Problems:   Polysubstance dependence including opioid type drug without complication, episodic abuse ([facility])   Current Medications:  Current Facility-Administered Medications  Medication Dose Route Frequency Provider Last Rate Last Dose  . alum & mag hydroxide-simeth (MAALOX/MYLANTA) 200-200-20 MG/5ML suspension 30 mL  30 mL Oral Q4H PRN Laveda AbbeLaurie Britton Parks, NP      . hydrOXYzine (ATARAX/VISTARIL) tablet 25 mg  25 mg Oral Q6H PRN Laveda AbbeLaurie Britton Parks, NP      . magnesium hydroxide (MILK OF MAGNESIA) suspension 30 mL  30 mL Oral Daily PRN Laveda AbbeLaurie Britton Parks, NP      . nicotine (NICODERM CQ - dosed in mg/24 hours) patch 21 mg  21 mg Transdermal Daily Kerry HoughSpencer E Simon, PA-C   21 mg at 07/03/16 1037  . traZODone (DESYREL) tablet 50 mg  50 mg Oral QHS PRN Laveda AbbeLaurie Britton Parks, NP   50 mg at 07/02/16 2250   PTA Medications: No prescriptions prior to admission.    Patient Stressors: Financial difficulties Marital or family conflict Occupational concerns Substance abuse  Patient Strengths: Ability for Warden/rangerinsight Communication skills Motivation for treatment/growth Supportive family/friends  Treatment Modalities: Medication Management, Group therapy, Case management,  1 to 1 session with clinician, Psychoeducation, Recreational therapy.   Physician Treatment Plan for Primary Diagnosis: Polysubstance dependence Long Term Goal(s):     Short Term Goals:    Medication Management: Evaluate patient's response, side effects, and tolerance of medication regimen.  Therapeutic Interventions: 1 to 1 sessions, Unit Group sessions and Medication administration.  Evaluation of Outcomes: Progressing  Physician Treatment Plan for Secondary Diagnosis: Active Problems:   Polysubstance dependence including  opioid type drug without complication, episodic abuse ([facility])  Long Term Goal(s):     Short Term Goals:       Medication Management: Evaluate patient's response, side effects, and tolerance of medication regimen.  Therapeutic Interventions: 1 to 1 sessions, Unit Group sessions and Medication administration.  Evaluation of Outcomes: Progressing   RN Treatment Plan for Primary Diagnosis: Polysubstance dependence Long Term Goal(s): Knowledge of disease and therapeutic regimen to maintain health will improve  Short Term Goals: Ability to remain free from injury will improve, Ability to disclose and discuss suicidal ideas and Ability to identify and develop effective coping behaviors will improve  Medication Management: RN will administer medications as ordered by provider, will assess and evaluate patient's response and provide education to patient for prescribed medication. RN will report any adverse and/or side effects to prescribing provider.  Therapeutic Interventions: 1 on 1 counseling sessions, Psychoeducation, Medication administration, Evaluate responses to treatment, Monitor vital signs and CBGs as ordered, Perform/monitor CIWA, COWS, AIMS and Fall Risk screenings as ordered, Perform wound care treatments as ordered.  Evaluation of Outcomes: Progressing   LCSW Treatment Plan for Primary Diagnosis: Polysubstance dependence Long Term Goal(s): Safe transition to appropriate next level of care at discharge, Engage patient in therapeutic group addressing interpersonal concerns.  Short Term Goals: Engage patient in aftercare planning with referrals and resources, Facilitate patient progression through stages of change regarding substance use diagnoses and concerns and Identify triggers associated with mental health/substance abuse issues  Therapeutic Interventions: Assess for all discharge needs, 1 to 1 time with Social worker, Explore available resources and support systems, Assess for  adequacy in community support network, Educate family and significant other(s) on suicide prevention, Complete Psychosocial Assessment, Interpersonal group  therapy.  Evaluation of Outcomes: Progressing   Progress in Treatment: Attending groups: No. Participating in groups: No. new to unit. Continuing to assess.  Taking medication as prescribed: Yes. Toleration medication: Yes. Family/Significant other contact made: No, will contact:  family member if patient consents Patient understands diagnosis: Yes. Discussing patient identified problems/goals with staff: Yes. Medical problems stabilized or resolved: Yes. Denies suicidal/homicidal ideation: Yes, per self report.  Issues/concerns per patient self-inventory: No.  Other: n/a   New problem(s) identified: No, Describe:  n/a  New Short Term/Long Term Goal(s): medication stabilization, detox, referral to appropriate aftercare.   Discharge Plan or Barriers: CSW assessing for appropriate referrals. No current providers per pt.   Reason for Continuation of Hospitalization: Depression Medication stabilization Withdrawal symptoms  Estimated Length of Stay: 3-5 days   Attendees: Patient: 07/03/2016 2:34 PM  Physician: Dr. Vallery RidgeElizabeth Oates MD 07/03/2016 2:34 PM  Nursing: Ralph LeydenJan, Jane RN 07/03/2016 2:34 PM  RN Care Manager:Jennifer Chestine Sporelark CM 07/03/2016 2:34 PM  Social Worker: Trula SladeHeather Smart, LCSW 07/03/2016 2:34 PM  Recreational Therapist:  07/03/2016 2:34 PM  Other:  07/03/2016 2:34 PM  Other:  07/03/2016 2:34 PM  Other: 07/03/2016 2:34 PM    Scribe for Treatment Team: Ledell PeoplesHeather N Smart, LCSW 07/03/2016 2:34 PM

## 2016-07-03 NOTE — Progress Notes (Addendum)
Patient ID: Marcus [name], male [name]  DOB: 1976-04-11, 40 y.o.   MRN: [id number]    Report accepted from admitting nurse, Otto Herbreka RN. Pt currently presents with a blunted affect and depressed behavior. Pt supported emotionally and encouraged to express concerns and questions. Pt's safety ensured with 15 minute and environmental checks. Pt currently denies SI/HI and A/V hallucinations. Pt verbally agrees to seek staff if SI/HI or A/VH occurs and to consult with staff before acting on any harmful thoughts. Pt given medications, see MAR. Pt reports generalized pain with a concentration in his lower back 8/10. Refuses any interventions. Will continue POC.  Pt denies being allergic to Naproxen.
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
## 2016-07-03 NOTE — [facility] Suicide Risk Assessment (Signed)
[facility] INPATIENT:  Family/Significant Other Suicide Prevention Education  Suicide Prevention Education:  Patient Refusal for Family/Significant Other Suicide Prevention Education: The patient Lorrene ReidDana Biskup has refused to provide written consent for family/significant other to be provided Family/Significant Other Suicide Prevention Education during admission and/or prior to discharge.  Physician notified.  Baldo DaubJolan Bran Aldridge 07/03/2016, 11:07 AM
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
## 2016-07-03 NOTE — [facility] Group Notes (Signed)
[facility] Group Notes:  (Nursing/MHT/Case Management/Adjunct)  Date:  07/03/2016  Time:  4:03 PM   Type of Therapy:  Psychoeducational Skills  Participation Level:  Did Not Attend  Participation Quality:  DID NOT ATTEND  Affect:  DID NOT ATTEND  Cognitive:  DID NOT ATTEND  Insight:  None  Engagement in Group:  DID NOT ATTEND  Modes of Intervention:  DID NOT ATTEND  Summary of Progress/Problems: Pt did not attend patient self inventory group.   Mareta Chesnut O Lokelani Lutes 07/03/2016, 4:03 PM 

## 2016-07-04 MED ORDER — ONDANSETRON 4 MG PO TBDP
4.0000 mg | ORAL_TABLET | Freq: Three times a day (TID) | ORAL | Status: DC | PRN
  Administered 2016-07-04: 4 mg via ORAL
  Filled 2016-07-04: qty 1

## 2016-07-04 NOTE — Progress Notes (Signed)
D Marcus [name] has been pretty much uncomfortable most of the day today. To begin with he demonstrates a depressed, flat and blunted affect. He says " I guess I'm ok..ibuprofen don't really know...." e completes his daily assessment and on it he writes he deneis SI today and he rates his depression, hopelessness and  Anxiety  " 5/3/8", respectively. AHe has endorsed multiple complaints of abdominal discomfort / distress /  Nausea and heartburn. When questioned by this Clinical research associatewriter, he reports he has been experiencing these symtoms for " over 2 months now", he deneis knowing the etiology and states " guess I'm going to have  ti have it looked at when I lleave". A HE is seen watching TV in the day room. He sleeps off and on through out the day  And is lowly motivated and / or engaged in his recovery. R Safety in place.

## 2016-07-04 NOTE — Progress Notes (Signed)
Pt attended the evening AA speaker meeting. Pt was engaged and participated. 07/04/16 10:01 PM Ashden Sonnenberg, Iziah C, NT 

## 2016-07-04 NOTE — Progress Notes (Signed)
[facility] MD Progress Note  07/04/2016 1:22 PM  Patient Active Problem List   Diagnosis Date Noted  . Polysubstance dependence including opioid type drug without complication, episodic abuse ([place]) 07/02/2016    Diagnosis: Polysubstance use disorder  Subjective: Patient reports stomach is a bit upset and will take Maalox today denies any current suicidal or homicidal ideation, plan or intent denies any psychotic symptoms. Plan is to monitor patient over the weekend for stability and patient is in agreement with this plan.  Objective: Well-developed well-nourished slightly disheveled man in no apparent distress pleasant and appropriate speech and motor within normal limits mood is "alright" affect is unremarkable thought processes linear and goal-directed thought content denies current suicidal or homicidal ideation, plan or intent, alert and oriented 3 insight and judgment are somewhat limited IQ appears in average range         Current Facility-Administered Medications (Analgesics):  .  ibuprofen (ADVIL,MOTRIN) tablet 400 mg     Current Facility-Administered Medications (Other):  .  alum & mag hydroxide-simeth (MAALOX/MYLANTA) 200-200-20 MG/5ML suspension 30 mL .  hydrOXYzine (ATARAX/VISTARIL) tablet 25 mg .  Influenza vac split quadrivalent PF (FLUARIX) injection 0.5 mL .  magnesium hydroxide (MILK OF MAGNESIA) suspension 30 mL .  nicotine (NICODERM CQ - dosed in mg/24 hours) patch 21 mg .  traZODone (DESYREL) tablet 50 mg  No current outpatient prescriptions on file.  Vital Signs:Blood pressure 108/69, pulse 92, temperature 97.8 F (36.6 C), temperature source Oral, resp. rate 20, height _0  (1.803 m), weight 70.8 kg (156 lb), SpO2 100 %.    Lab Results:  Results for orders placed or performed during the hospital encounter of 07/02/16 (from the past 48 hour(s))  Comprehensive metabolic panel     Status: Abnormal   Collection Time: 07/02/16  4:16 PM  Result Value Ref  Range   Sodium 139 135 - 145 mmol/L   Potassium 3.9 3.5 - 5.1 mmol/L   Chloride 110 101 - 111 mmol/L   CO2 24 22 - 32 mmol/L   Glucose, Bld 94 65 - 99 mg/dL   BUN 7 6 - 20 mg/dL   Creatinine, Ser 0.80 0.61 - 1.24 mg/dL   Calcium 8.8 (L) 8.9 - 10.3 mg/dL   Total Protein 6.8 6.5 - 8.1 g/dL   Albumin 3.7 3.5 - 5.0 g/dL   AST 21 15 - 41 U/L   ALT 26 17 - 63 U/L   Alkaline Phosphatase 69 38 - 126 U/L   Total Bilirubin 0.6 0.3 - 1.2 mg/dL   GFR calc non Af Amer >60 >60 mL/min   GFR calc Af Amer >60 >60 mL/min    Comment: (NOTE) The eGFR has been calculated using the CKD EPI equation. This calculation has not been validated in all clinical situations. eGFR's persistently <60 mL/min signify possible Chronic Kidney Disease.    Anion gap 5 5 - 15  Ethanol     Status: None   Collection Time: 07/02/16  4:16 PM  Result Value Ref Range   Alcohol, Ethyl (B) <5 <5 mg/dL    Comment:        LOWEST DETECTABLE LIMIT FOR SERUM ALCOHOL IS 5 mg/dL FOR MEDICAL PURPOSES ONLY   Salicylate level     Status: None   Collection Time: 07/02/16  4:16 PM  Result Value Ref Range   Salicylate Lvl <7.8 2.8 - 30.0 mg/dL  Acetaminophen level     Status: Abnormal   Collection Time: 07/02/16  4:16 PM  Result  Value Ref Range   Acetaminophen (Tylenol), Serum <10 (L) 10 - 30 ug/mL    Comment:        THERAPEUTIC CONCENTRATIONS VARY SIGNIFICANTLY. A RANGE OF 10-30 ug/mL MAY BE AN EFFECTIVE CONCENTRATION FOR MANY PATIENTS. HOWEVER, SOME ARE BEST TREATED AT CONCENTRATIONS OUTSIDE THIS RANGE. ACETAMINOPHEN CONCENTRATIONS >150 ug/mL AT 4 HOURS AFTER INGESTION AND >50 ug/mL AT 12 HOURS AFTER INGESTION ARE OFTEN ASSOCIATED WITH TOXIC REACTIONS.   cbc     Status: None   Collection Time: 07/02/16  4:16 PM  Result Value Ref Range   WBC 10.4 4.0 - 10.5 K/uL   RBC 4.48 4.22 - 5.81 MIL/uL   Hemoglobin 13.7 13.0 - 17.0 g/dL   HCT 40.7 39.0 - 52.0 %   MCV 90.8 78.0 - 100.0 fL   MCH 30.6 26.0 - 34.0 pg   MCHC  33.7 30.0 - 36.0 g/dL   RDW 13.6 11.5 - 15.5 %   Platelets 282 150 - 400 K/uL  Rapid urine drug screen (hospital performed)     Status: Abnormal   Collection Time: 07/02/16  4:16 PM  Result Value Ref Range   Opiates NONE DETECTED NONE DETECTED   Cocaine NONE DETECTED NONE DETECTED   Benzodiazepines NONE DETECTED NONE DETECTED   Amphetamines NONE DETECTED NONE DETECTED   Tetrahydrocannabinol POSITIVE (A) NONE DETECTED   Barbiturates NONE DETECTED NONE DETECTED    Comment:        DRUG SCREEN FOR MEDICAL PURPOSES ONLY.  IF CONFIRMATION IS NEEDED FOR ANY PURPOSE, NOTIFY LAB WITHIN 5 DAYS.        LOWEST DETECTABLE LIMITS FOR URINE DRUG SCREEN Drug Class       Cutoff (ng/mL) Amphetamine      1000 Barbiturate      200 Benzodiazepine   528 Tricyclics       413 Opiates          300 Cocaine          300 THC              50     Physical Findings: AIMS: Facial and Oral Movements Muscles of Facial Expression: None, normal Lips and Perioral Area: None, normal Jaw: None, normal Tongue: None, normal,Extremity Movements Upper (arms, wrists, hands, fingers): None, normal Lower (legs, knees, ankles, toes): None, normal, Trunk Movements Neck, shoulders, hips: None, normal, Overall Severity Severity of abnormal movements (highest score from questions above): None, normal Incapacitation due to abnormal movements: None, normal Patient's awareness of abnormal movements (rate only patient's report): No Awareness, Dental Status Current problems with teeth and/or dentures?: No Does patient usually wear dentures?: No  CIWA:    COWS:      Assessment/Plan: Patient does not appear to be experiencing any significant withdrawal symptoms set he does complain of some mild GI distress and will take Maalox. He is using Vistaril when necessary for complaints of itching and/or anxiety as well. We will continue to monitor.  Linard Millers, MD 07/04/2016, 1:22 PM

## 2016-07-04 NOTE — Progress Notes (Signed)
Patient ID: Lorrene ReidDana Pusey, male   DOB: [birth year], 40 y.o.   MRN: [id number]  Pt currently presents with an increased anxious affect and behavior. Pt reports to writer that their goal is to "go to sleep early so I can not be tired tomorrow." Pt forwards little to Albertson'swriter tonight. Pt reports good sleep with current medication regimen.   Pt provided with medications per providers orders. Pt's labs and vitals were monitored throughout the night. Pt supported emotionally and encouraged to express concerns and questions. Pt educated on medications.  Pt's safety ensured with 15 minute and environmental checks. Pt currently denies SI/HI and A/V hallucinations. Pt verbally agrees to seek staff if SI/HI or A/VH occurs and to consult with staff before acting on any harmful thoughts. Will continue POC.

## 2016-07-04 NOTE — Progress Notes (Signed)
Recreation Therapy Notes  Date: 07/04/16 Time: 0930 Location: 300 Hall Group Room  Group Topic: Stress Management  Goal Area(s) Addresses:  Patient will verbalize importance of using healthy stress management.  Patient will identify positive emotions associated with healthy stress management.   Intervention: Stress Management  Activity :  Progressive Muscle Relaxation. LRT introduced that stress management technique of progressive muscle relaxation.  LRT read a script to allow patients to engage in the activity.  Patients were to follow along as LRT read script.  Education:  Stress Management, Discharge Planning.   Education Outcome: Acknowledges edcuation/In group clarification offered/Needs additional education  Clinical Observations/Feedback: Pt did not attend group.   Caroll RancherMarjette Siham Bucaro, LRT/CTRS       Caroll RancherLindsay, Zaylin Runco A 07/04/2016 11:55 AM

## 2016-07-05 MED ORDER — HYDROXYZINE HCL 50 MG PO TABS
50.0000 mg | ORAL_TABLET | ORAL | Status: DC | PRN
  Administered 2016-07-05 – 2016-07-08 (×6): 50 mg via ORAL
  Filled 2016-07-05 (×6): qty 1

## 2016-07-05 MED ORDER — ARIPIPRAZOLE 2 MG PO TABS
2.0000 mg | ORAL_TABLET | Freq: Every day | ORAL | Status: DC
  Administered 2016-07-05 – 2016-07-09 (×5): 2 mg via ORAL
  Filled 2016-07-05 (×5): qty 1
  Filled 2016-07-05: qty 21
  Filled 2016-07-05 (×2): qty 1

## 2016-07-05 MED ORDER — CITALOPRAM HYDROBROMIDE 10 MG PO TABS
10.0000 mg | ORAL_TABLET | Freq: Every day | ORAL | Status: DC
  Administered 2016-07-05 – 2016-07-08 (×4): 10 mg via ORAL
  Filled 2016-07-05 (×6): qty 1

## 2016-07-05 MED ORDER — GABAPENTIN 100 MG PO CAPS
200.0000 mg | ORAL_CAPSULE | Freq: Three times a day (TID) | ORAL | Status: DC
  Administered 2016-07-05 – 2016-07-08 (×8): 200 mg via ORAL
  Filled 2016-07-05 (×12): qty 2

## 2016-07-05 MED ORDER — GABAPENTIN 100 MG PO CAPS: 100.0000 mg | ORAL_CAPSULE | Freq: Three times a day (TID) | ORAL | Status: DC

## 2016-07-05 NOTE — Progress Notes (Deleted)
[facility]  07/05/2016 12:40 PM  Patient Active Problem List   Diagnosis Date Noted  . Polysubstance dependence including opioid type drug without complication, episodic abuse (HCC) 07/02/2016    Diagnosis: Polysubstance use disorder  Subjective: Denies any current suicidal or homicidal ideation, plan or intent denies any psychotic symptoms. Plan is to monitor patient over the weekend for stability and patient is in agreement with this plan.  He is interested in seeking longer detox rehab program.  Objective:  He is pleasant, calm and cooperative.  He recently lost his job and felt "down and out".  [place] he states that he can remember being depressed since he was 40 years old.  He had never been on prescription psychotropics but admitted to using Zoloft and Prozac back in the late 1990's ( a friend's RX, "made me feel numb").    Vital Signs:Blood pressure 120/84, pulse (!) 113, temperature 97.8 F (36.6 C), temperature source Oral, resp. rate 18, height 5\' 11"  (1.803 m), weight 70.8 kg (156 lb), SpO2 100 %.    Lab Results:  No results found for this or any previous visit (from the past 48 hour(s)).  Physical Findings: AIMS: Facial and Oral Movements Muscles of Facial Expression: None, normal Lips and Perioral Area: None, normal Jaw: None, normal Tongue: None, normal,Extremity Movements Upper (arms, wrists, hands, fingers): None, normal Lower (legs, knees, ankles, toes): None, normal, Trunk Movements Neck, shoulders, hips: None, normal, Overall Severity Severity of abnormal movements (highest score from questions above): None, normal Incapacitation due to abnormal movements: None, normal Patient's awareness of abnormal movements (rate only patient's report): No Awareness, Dental Status Current problems with teeth and/or dentures?: No Does patient usually wear dentures?: No  CIWA:  CIWA-Ar Total: 3 COWS:  COWS Total Score: 0   Assessment/Plan: Patient does  not appear to be experiencing any significant withdrawal symptoms set he does complain of some mild GI distress and will take Maalox. He is using Vistaril but is not effective.  Start Abilify trial 2 mg for anxiety and depression.  Celexa 10 mg depression.  We will continue to monitor.  Lindwood QuaSheila May Ronnika Collett, NP [facility] 07/05/2016, 12:40 PM

## 2016-07-05 NOTE — Plan of Care (Signed)
Problem: Activity: Goal: Sleeping patterns will improve Outcome: Progressing Pt. had 6.75 hrs. of sleep last night.

## 2016-07-05 NOTE — [facility] Group Notes (Signed)
Adult Group Therapy Note  Date:  07/05/2016  Time: 10:00AM-11:15AM  Group Topic/Focus: Today's group focused on the topic of fear and healthy coping skills.  An exercise was performed which elicited sources of fear that various patients feel, giving an opportunity for other patients to identify with that fear.  After a discussion of each, a coping skill was named that could be attempted in the future when such a fear arises.    Participation Level:  Active  Participation Quality:  Attentive and Sharing  Affect:  Anxious and Blunted  Cognitive:  Appropriate  Insight: Good  Engagement in Group:  Engaged  Modes of Intervention:  Activity, Discussion and Support  Additional Comments:  The patient expressed support for others throughout group, and shared that he had fears in common with them.  He did not talk very much, but when he did it was cogent and appropriate.  Carloyn JaegerMareida J Grossman-Orr 07/05/2016 , 1:09 PM

## 2016-07-05 NOTE — [facility] Group Notes (Signed)
[facility] Group Notes:  (Nursing/MHT/Case Management/Adjunct)  Date:  07/05/2016  Time:  1315  Type of Therapy:  Nurse Education  /  Life SKills:  The group focuses on teaching patients how to identify their needs and then how to identify their needs in a healthy way.  Participation Level:  Minimal  Participation Quality:  Appropriate  Affect:  Appropriate  Cognitive:  Alert  Insight:  Improving  Engagement in Group:  Engaged  Modes of Intervention:  Education  Summary of Progress/Problems:  Marcus [name], Marcus [name] 07/05/2016, 6:31 PM

## 2016-07-05 NOTE — Progress Notes (Signed)
Pt attended the evening AA speaker meeting. Pt was engaged.  Marcus [name], Ransome C, NT 07/05/16 8:28 PM  

## 2016-07-05 NOTE — Progress Notes (Signed)
D: Pt. is up and visible in the milieu, seen watching TV. Denies having any SI/HI/AVH at this time. Rates pain 4/10, headache. Pt. presents with an anxious affect and mood. Pt. states " I had a good day". Pt. was cooperative and pleasant with interaction.   A: Encouragement and support given. Meds. ordered and given. PRNs Ibuprofen and Trazodone requested and given. Will re-eval as necessary.   R: 1:1 interaction in private. Safety maintained with Q 15 checks. Continues to follow treatment plan and will monitor closely. No questions/concerns at this time.

## 2016-07-05 NOTE — [facility] Group Notes (Signed)
Late entry from 07/04/16:      [facility]  1:15 PM   Type of Therapy: Group Therapy  Participation Level: Did Not Attend. Patient invited to participate but declined.   Samuella BruinKristin Jameel Quant, MSW, LCSW Clinical Social Worker [facility] [phone]

## 2016-07-06 DIAGNOSIS — F17210 Nicotine dependence, cigarettes, uncomplicated: Secondary | ICD-10-CM

## 2016-07-06 DIAGNOSIS — Z79899 Other long term (current) drug therapy: Secondary | ICD-10-CM

## 2016-07-06 DIAGNOSIS — F11220 Opioid dependence with intoxication, uncomplicated: Secondary | ICD-10-CM

## 2016-07-06 LAB — TSH: TSH: 1.213 u[IU]/mL (ref 0.350–4.500)

## 2016-07-06 LAB — HEPATIC FUNCTION PANEL
ALBUMIN: 3.9 g/dL (ref 3.5–5.0)
ALT: 49 U/L (ref 17–63)
AST: 42 U/L — ABNORMAL HIGH (ref 15–41)
Alkaline Phosphatase: 59 U/L (ref 38–126)
Bilirubin, Direct: <0.1 mg/dL — ABNORMAL LOW (ref 0.1–0.5)
TOTAL PROTEIN: 7.3 g/dL (ref 6.5–8.1)
Total Bilirubin: 0.5 mg/dL (ref 0.3–1.2)

## 2016-07-06 LAB — LIPID PANEL
CHOL/HDL RATIO: 2.7 ratio
Cholesterol: 162 mg/dL (ref 0–200)
HDL: 59 mg/dL (ref >40)
LDL Cholesterol: 87 mg/dL (ref 0–99)
Triglycerides: 82 mg/dL (ref <150)
VLDL: 16 mg/dL (ref 0–40)

## 2016-07-06 MED ORDER — ENSURE ENLIVE PO LIQD
237.0000 mL | Freq: Two times a day (BID) | ORAL | Status: DC
  Administered 2016-07-06 – 2016-07-08 (×4): 237 mL via ORAL

## 2016-07-06 NOTE — Progress Notes (Signed)
D) Pt has attended the groups and interacts with his peers appropriately. Pt denies SI and HI. Rates his depression at a 4 hoplessness at a 3 and his anxiety at a 3. States that the new regime that he is on with his medications are "really working for me. I haven't heard any voices at all since I started on them. Pt. Has been positive and upbeat all shift. A) given support, reassurance and praise. Provided with a 1:1.  R) given support reassurance and praise. Denies SI and HI.

## 2016-07-06 NOTE — [facility] Group Notes (Signed)
Adult Therapy Group Note  Date: 07/06/2016  Time:  10:00-11:00AM  Group Topic/Focus: Healthy Press photographerupport Systems   Building Self Esteem:   The focus of this group was to assist patients in identifying their current healthy supports as well as to list and discuss other supports that can be put in place to help with achieving life goals.  An activity allowed small groups to discuss different supports, their pros and cons, and how to get started with that type of support.  These items included supports such as 12-step groups, individual therapy, psychiatrists, children, faith activities, accountability partner, sponsor, group therapy, support groups, classes on mental health, phone apps/YouTube, gym/exercise, and more.  Songs were  played at the beginning and at the end of group with a short discussion about the emotions evoked as well as how to use music as an additional support/coping mechanism.   Participation Level:  Active  Participation Quality:  Attentive and Sharing  Affect:  Appropriate  Cognitive:  Appropriate  Insight: Good  Engagement in Group:  Engaged  Modes of Intervention:  Activity, Discussion and Support  Additional Comments:  The patient expressed that a current health support is not something he is sure he has currently, but stated he is actively trying to figure that out now.  He participated fully in the small group and large group discussions.  Lynnell ChadMareida J Grossman-Orr, LCSW 07/06/2016  1:03 PM

## 2016-07-06 NOTE — Progress Notes (Signed)
D: Pt denies SI/HI. Pt is pleasant and cooperative. Pt goal for today is to work on getting through the program. A: Pt was offered support and encouragement. Pt was given scheduled medications. Pt was encourage to attend groups. Q 15 minute checks were done for safety.  R:Pt attends groups and interacts well with peers and staff. Pt is taking medication. Pt has no complaints.Pt receptive to treatment and safety maintained on unit.

## 2016-07-06 NOTE — Progress Notes (Signed)
Pt attended the evening AA speaker meeting. Pt was appropriate and engaged.  Allicia Culley, Haralambos C, NT 07/06/16 8:27 PM 

## 2016-07-06 NOTE — Progress Notes (Signed)
[facility]  07/06/2016 9:22 AM Marcus [name]  MRN:  [id number] Subjective:  Patient reports " I am feeling better than yesterday after my medication adjustment."  Objective: Marcus [name] is awake, alert and oriented *4 Seen resting in dayroom interacting with peers and staff.  Denies suicidal or homicidal ideation. Denies auditory or visual hallucination and does not appear to be responding to internal stimuli.  Patient reports he is medication compliant without mediation side effects. Report a recent medication adjustment states" I know the medication hasn't had time to be effective but, I feel better than yesterday." Patient denies depression or depressive symptoms 0/10 Reports good appetite states he was experiencing some GI symptoms on yesterday, however symptoms has resolved.  And reports  resting well. Support, encouragement and reassurance was provided.   Principal Problem: Polysubstance dependence including opioid type drug without complication, episodic abuse (HCC) Diagnosis:   Patient Active Problem List   Diagnosis Date Noted  . Polysubstance dependence including opioid type drug without complication, episodic abuse [facility](HCC) [F11.220] 07/02/2016   Total Time spent with patient: 30 minutes  Past Psychiatric History:   Past Medical History:  Past Medical History:  Diagnosis Date  . Hepatitis C    History reviewed. No pertinent surgical history. Family History: History reviewed. No pertinent family history. Family Psychiatric  History:  Social History:  History  Alcohol Use No     History  Drug Use  . Frequency: 7.0 times per week  . Types: Methamphetamines, Heroin    Comment: Daily use where possible    Social History   Social History  . Marital status: Married    Spouse name: N/A  . Number of children: N/A  . Years of education: N/A   Social History Main Topics  . Smoking status: Current Every Day Smoker  . Smokeless tobacco: Never Used  . Alcohol use No   . Drug use:     Frequency: 7.0 times per week    Types: Methamphetamines, Heroin     Comment: Daily use where possible  . Sexual activity: Not Currently   Other Topics Concern  . None   Social History Narrative  . None   Additional Social History:    Pain Medications: See PTA Prescriptions: See PTA Over the Counter: See PTA History of alcohol / drug use?: Yes Name of Substance 1: Meth 1 - Frequency: Daily 1 - Duration: Ongoing 1 - Last Use / Amount: 06/23/16 Name of Substance 2: Heroin 2 - Amount (size/oz): Varied 2 - Frequency: Daily where possible 2 - Duration: Ongoing 2 - Last Use / Amount: 06/01/16 Name of Substance 3: Opioids              Sleep: Fair  Appetite:  Fair  Current Medications: Current Facility-Administered Medications  Medication Dose Route Frequency Provider Last Rate Last Dose  . alum & mag hydroxide-simeth (MAALOX/MYLANTA) 200-200-20 MG/5ML suspension 30 mL  30 mL Oral Q4H PRN Laveda AbbeLaurie Britton Parks, NP   30 mL at 07/04/16 1829  . ARIPiprazole (ABILIFY) tablet 2 mg  2 mg Oral Daily Adonis BrookSheila Agustin, NP   2 mg at 07/06/16 0748  . citalopram (CELEXA) tablet 10 mg  10 mg Oral Daily Adonis BrookSheila Agustin, NP   10 mg at 07/06/16 0748  . gabapentin (NEURONTIN) capsule 200 mg  200 mg Oral TID Adonis BrookSheila Agustin, NP   200 mg at 07/06/16 0749  . hydrOXYzine (ATARAX/VISTARIL) tablet 50 mg  50 mg Oral Q4H PRN Adonis BrookSheila Agustin, NP  50 mg at 07/05/16 1557  . ibuprofen (ADVIL,MOTRIN) tablet 400 mg  400 mg Oral Q6H PRN Acquanetta SitElizabeth Woods Oates, MD   400 mg at 07/05/16 2133  . Influenza vac split quadrivalent PF (FLUARIX) injection 0.5 mL  0.5 mL Intramuscular Tomorrow-1000 Jackelyn PolingJason A Berry, NP      . magnesium hydroxide (MILK OF MAGNESIA) suspension 30 mL  30 mL Oral Daily PRN Laveda AbbeLaurie Britton Parks, NP      . nicotine (NICODERM CQ - dosed in mg/24 hours) patch 21 mg  21 mg Transdermal Daily Kerry HoughSpencer E Simon, PA-C   21 mg at 07/06/16 0749  . ondansetron (ZOFRAN-ODT) disintegrating  tablet 4 mg  4 mg Oral Q8H PRN Acquanetta SitElizabeth Woods Oates, MD   4 mg at 07/04/16 1558  . traZODone (DESYREL) tablet 50 mg  50 mg Oral QHS PRN Laveda AbbeLaurie Britton Parks, NP   50 mg at 07/05/16 2133    Lab Results:  Results for orders placed or performed during the hospital encounter of 07/02/16 (from the past 48 hour(s))  Hepatic function panel     Status: Abnormal   Collection Time: 07/06/16  6:41 AM  Result Value Ref Range   Total Protein 7.3 6.5 - 8.1 g/dL   Albumin 3.9 3.5 - 5.0 g/dL   AST 42 (H) 15 - 41 U/L   ALT 49 17 - 63 U/L   Alkaline Phosphatase 59 38 - 126 U/L   Total Bilirubin 0.5 0.3 - 1.2 mg/dL   Bilirubin, Direct <1.6<0.1 (L) 0.1 - 0.5 mg/dL   Indirect Bilirubin NOT CALCULATED 0.3 - 0.9 mg/dL    Comment: [facility] at [facility]  TSH     Status: None   Collection Time: 07/06/16  6:41 AM  Result Value Ref Range   TSH 1.213 0.350 - 4.500 uIU/mL    Comment: Performed by a 3rd Generation assay with a functional sensitivity of <=0.01 uIU/mL. [facility]     Blood Alcohol level:  Lab Results  Component Value Date   [facility] 07/[facility]2016    Metabolic Disorder Labs: No results found for: HGBA1C, MPG No results found for: PROLACTIN No results found for: CHOL, TRIG, HDL, CHOLHDL, VLDL, LDLCALC  Physical Findings: AIMS: Facial and Oral Movements Muscles of Facial Expression: None, normal Lips and Perioral Area: None, normal Jaw: None, normal Tongue: None, normal,Extremity Movements Upper (arms, wrists, hands, fingers): None, normal Lower (legs, knees, ankles, toes): None, normal, Trunk Movements Neck, shoulders, hips: None, normal, Overall Severity Severity of abnormal movements (highest score from questions above): None, normal Incapacitation due to abnormal movements: None, normal Patient's awareness of abnormal movements (rate only patient's report): No Awareness, Dental Status Current problems with teeth and/or dentures?:  No Does patient usually wear dentures?: No  CIWA:  CIWA-Ar Total: 3 COWS:  COWS Total Score: 1  Musculoskeletal: Strength & Muscle Tone: within normal limits Gait & Station: normal Patient leans: N/A  Psychiatric Specialty Exam: Physical Exam  Vitals reviewed. Constitutional: He appears well-nourished.  Cardiovascular: Regular rhythm.   Skin: Skin is warm.    Review of Systems  Psychiatric/Behavioral: Positive for depression, substance abuse and suicidal ideas. The patient is nervous/anxious.     Blood pressure 122/64, pulse 87, temperature 97.8 F (36.6 C), resp. rate 16, height 5\' 11"  (1.803 m), weight 70.8 kg (156 lb), SpO2 100 %.Body mass index is 21.76 kg/m.  General Appearance: Casual  Eye Contact:  Fair  Speech:  Clear and Coherent  Volume:  Normal  Mood:  Anxious and Euphoric  Affect:  Congruent  Thought Process:  Coherent  Orientation:  Full (Time, Place, and Person)  Thought Content:  Hallucinations: None  Suicidal Thoughts:  No  Homicidal Thoughts:  No  Memory:  Immediate;   Fair Recent;   Fair Remote;   Fair  Judgement:  Fair  Insight:  Fair  Psychomotor Activity:  Normal  Concentration:  Concentration: Fair  Recall:  [employer] of Knowledge:  Fair  Language:  Fair  Akathisia:  No  Handed:  Right  AIMS (if indicated):     Assets:  Communication Skills Intimacy Social Support Talents/Skills  ADL's:  Intact  Cognition:  WNL  Sleep:  Number of Hours: 7.75     I agree with current treatment plan on 07/06/2016, Patient seen face-to-face for psychiatric evaluation follow-up, chart reviewed. Reviewed the information documented and agree with the treatment plan.  Treatment Plan Summary: Daily contact with patient to assess and evaluate symptoms and progress in treatment and Medication management   Continue with Celexa 10 mg and Abilify 2 mg, Neurontin 200 mg for mood stabilization. Continue with Trazodone 50 mg for insomnia Will continue to monitor  vitals ,medication compliance and treatment side effects while patient is here.  Reviewed labs: elevated ,BAL - , UDS - positive for [facility] CSW will start working on disposition.  Patient to participate in therapeutic milieu   Oneta Rack, NP 07/06/2016, 9:22 AM

## 2016-07-07 LAB — HCV RNA QUANT
HCV QUANT LOG: 5.36 {Log_IU}/mL (ref >1.70)
HCV QUANT: 229000 [IU]/mL (ref >50)

## 2016-07-07 LAB — HEMOGLOBIN A1C
Hgb A1c MFr Bld: 5 % (ref 4.8–5.6)
MEAN PLASMA GLUCOSE: 97 mg/dL

## 2016-07-07 LAB — PROLACTIN: Prolactin: 4.2 ng/mL (ref 4.0–15.2)

## 2016-07-07 NOTE — [facility] Group Notes (Signed)
[facility] LCSW Group Therapy  07/07/2016 4:35 PM  Type of Therapy:  Group Therapy  Participation Level:  Active  Participation Quality:  Attentive  Affect:  Appropriate  Cognitive:  Alert and Oriented  Insight:  Improving  Engagement in Therapy:  Improving  Modes of Intervention:  Confrontation, Discussion, Education, Problem-solving, Rapport Building, Socialization and Support  Summary of Progress/Problems: Today's Topic: Overcoming Obstacles. Patients identified one short term goal and potential obstacles in reaching this goal. Patients processed barriers involved in overcoming these obstacles. Patients identified steps necessary for overcoming these obstacles and explored motivation (internal and external) for facing these difficulties head on. [name] was attentive and engaged during today's processing group. He shared that his biggest obstacle is finding a safe discharge plan "[facility] Patient was open to learning about [facility] and [facility] from CSW and other patients. He reported "[facility]'m really [facility] than next [facility]."   Pulte HomesHeather N Smart LCSW 07/07/2016, 4:35 PM

## 2016-07-07 NOTE — Plan of Care (Signed)
Problem: Education: Goal: Ability to make informed decisions regarding treatment will improve Outcome: Progressing Nurse discussed suicidal thoughts/depression/coping skills with patient.        

## 2016-07-07 NOTE — Progress Notes (Signed)
Patient attended AA group meeting tonight.  

## 2016-07-07 NOTE — Progress Notes (Signed)
Recreation Therapy Notes  Date: 07/07/16 Time: 0930 Location: [address] Group   Group Topic: Stress Management  Goal Area(s) Addresses:  Patient will verbalize importance of using healthy stress management.  Patient will identify positive emotions associated with healthy stress management.   Behavioral Response: Engaged  Intervention: Stress Management  Activity :  Imagery to Connect with Feelings.  LRT introduced the stress management technique of guided imagery.  LRT read a script to allow the patients to examine and connect with their inner feelings.  Patients were to follow along and engaged as LRT read script.  Education:  Stress Management, Discharge Planning.   Education Outcome: Acknowledges edcuation/In group clarification offered/Needs additional education  Clinical Observations/Feedback: Pt attended group.    Caroll RancherMarjette Eman Morimoto, LRT/CTRS     Lillia AbedLindsay, Violet Seabury A 07/07/2016 11:59 AM

## 2016-07-07 NOTE — Progress Notes (Signed)
D:  Patient's self inventory sheet, patient sleeps good, sleep medication is helpful.  Good appetite, normal energy level, good concentration.  Rated depression  4, hopeless 2, anxiety 5.  Denied withdrawals.  Denied, sometimes SI.  Physical problems, headaches, pain, worst pain  6-7  In past 24 hours, head, back, neck, joints.  Pain medication is helpful.  Goal is to stick to program, not smoking.  Plans to work with staff and work on self.   A:  Medications administered per MD orders.  Emotional support and encouragement given patient. R:  Denied SI and HI, contracts for safety.  Denied A/V hallucinations.  Safety maintained with 15 minute checks.

## 2016-07-07 NOTE — Progress Notes (Signed)
[facility]  07/07/2016 2:11 PM  Patient Active Problem List   Diagnosis Date Noted  . Polysubstance dependence including opioid type drug without complication, episodic abuse (HCC) 07/02/2016    Diagnosis: Polysubstance dependence  Subjective: Patient was take placed on several medications over the weekend and states they are helpful. He denies any current side effects he denies any current suicidal or homicidal ideation, plan or intent. Patient was informed that he should be discharging in the next day or so and hopefully will have some follow-up plans available for him. Patient is in agreement with this plan  Objective: Well-developed well-nourished man in no apparent distress pleasant and cooperative mood is okay affect is congruent speech and motor are within normal limits thought processes linear and goal-directed thought content denies current auditory hallucinations suicidal or homicidal ideation, plan or intent alert and oriented 3 IQ appears an average range insight and judgment are fair         Current Facility-Administered Medications (Analgesics):  .  ibuprofen (ADVIL,MOTRIN) tablet 400 mg     Current Facility-Administered Medications (Other):  .  alum & mag hydroxide-simeth (MAALOX/MYLANTA) 200-200-20 MG/5ML suspension 30 mL .  ARIPiprazole (ABILIFY) tablet 2 mg .  citalopram (CELEXA) tablet 10 mg .  feeding supplement (ENSURE ENLIVE) (ENSURE ENLIVE) liquid 237 mL .  gabapentin (NEURONTIN) capsule 200 mg .  hydrOXYzine (ATARAX/VISTARIL) tablet 50 mg .  magnesium hydroxide (MILK OF MAGNESIA) suspension 30 mL .  nicotine (NICODERM CQ - dosed in mg/24 hours) patch 21 mg .  ondansetron (ZOFRAN-ODT) disintegrating tablet 4 mg .  traZODone (DESYREL) tablet 50 mg  No current outpatient prescriptions on file.  Vital Signs:Blood pressure 101/63, pulse 91, temperature 99.1 F (37.3 C), temperature source Oral, resp. rate 18, height 5\' 11"  (1.803 m), weight 70.8  kg (156 lb), SpO2 100 %.    Lab Results:  Results for orders placed or performed during the hospital encounter of 07/02/16 (from the past 48 hour(s))  Hemoglobin A1c     Status: None   Collection Time: 07/06/16  6:41 AM  Result Value Ref Range   Hgb A1c MFr Bld 5.0 4.8 - 5.6 %    Comment: (NOTE)         Pre-diabetes: 5.7 - 6.4         Diabetes: >6.4         Glycemic control for adults with diabetes: <7.0    Mean Plasma Glucose 97 mg/dL    Comment: (NOTE) Performed At: [facility] [address] [place], [place] [id number] Mila HomerHancock William F MD [id number]:[phone] Performed at [facility]   Prolactin     Status: None   Collection Time: 07/06/16  6:41 AM  Result Value Ref Range   Prolactin 4.2 4.0 - 15.2 ng/mL    Comment: (NOTE) Performed At: [facility] [address] [place], [place] [id number] Mila HomerHancock William F MD [id number]:[phone] Performed at [facility]   Lipid panel     Status: None   Collection Time: 07/06/16  6:41 AM  Result Value Ref Range   Cholesterol 162 0 - 200 mg/dL   Triglycerides 82 <413<150 mg/dL   HDL 59 >24>40 mg/dL   Total CHOL/HDL Ratio 2.7 RATIO   VLDL 16 0 - 40 mg/dL   LDL Cholesterol 87 0 - 99 mg/dL    Comment:        Total Cholesterol/HDL:CHD Risk Coronary Heart Disease Risk Table  Men   Women  1/2 Average Risk   3.4   3.3  Average Risk       5.0   4.4  2 X Average Risk   9.6   7.1  3 X Average Risk  23.4   11.0        Use the calculated Patient Ratio above and the CHD Risk Table to determine the patient's CHD Risk.        ATP III CLASSIFICATION (LDL):  <100     mg/dL   Optimal  [id number]  mg/dL   Near or Above                    Optimal  130-159  mg/dL   Borderline  [phone]  mg/dL   High  >956>190     mg/dL   Very High Performed at [facility]   Hepatic function panel     Status: Abnormal   Collection Time: 07/06/16  6:41 AM  Result Value Ref Range   Total Protein 7.3  6.5 - 8.1 g/dL   Albumin 3.9 3.5 - 5.0 g/dL   AST 42 (H) 15 - 41 U/L   ALT 49 17 - 63 U/L   Alkaline Phosphatase 59 38 - 126 U/L   Total Bilirubin 0.5 0.3 - 1.2 mg/dL   Bilirubin, Direct <2.1<0.1 (L) 0.1 - 0.5 mg/dL   Indirect Bilirubin NOT CALCULATED 0.3 - 0.9 mg/dL    Comment: Performed at [facility]  TSH     Status: None   Collection Time: 07/06/16  6:41 AM  Result Value Ref Range   TSH 1.213 0.350 - 4.500 uIU/mL    Comment: Performed by a 3rd Generation assay with a functional sensitivity of <=0.01 uIU/mL. Performed at [facility]     Physical Findings: AIMS: Facial and Oral Movements Muscles of Facial Expression: None, normal Lips and Perioral Area: None, normal Jaw: None, normal Tongue: None, normal,Extremity Movements Upper (arms, wrists, hands, fingers): None, normal Lower (legs, knees, ankles, toes): None, normal, Trunk Movements Neck, shoulders, hips: None, normal, Overall Severity Severity of abnormal movements (highest score from questions above): None, normal Incapacitation due to abnormal movements: None, normal Patient's awareness of abnormal movements (rate only patient's report): No Awareness, Dental Status Current problems with teeth and/or dentures?: No Does patient usually wear dentures?: No  CIWA:  CIWA-Ar Total: 3 COWS:  COWS Total Score: 1   Assessment/Plan: Patient has not shown any issues with any detoxification or decompensation and denies any suicidal or homicidal ideation, plan or intent. He was started on some medications over the weekend and feels that they are helpful so we will continue them. Plan is for patient to discharge in the next 1-2 days if present course continuous.  Acquanetta SitElizabeth Woods Oates, MD 07/07/2016, 2:11 PM

## 2016-07-08 MED ORDER — GABAPENTIN 400 MG PO CAPS
400.0000 mg | ORAL_CAPSULE | Freq: Three times a day (TID) | ORAL | Status: DC
  Administered 2016-07-08 – 2016-07-09 (×3): 400 mg via ORAL
  Filled 2016-07-08: qty 1
  Filled 2016-07-08: qty 63
  Filled 2016-07-08 (×5): qty 1
  Filled 2016-07-08 (×2): qty 63

## 2016-07-08 MED ORDER — TRAZODONE HCL 50 MG PO TABS: 50.0000 mg | ORAL_TABLET | Freq: Every evening | ORAL | 0 refills | Status: DC | PRN

## 2016-07-08 MED ORDER — NICOTINE 21 MG/24HR TD PT24: 21.0000 mg | MEDICATED_PATCH | Freq: Every day | TRANSDERMAL | 0 refills | Status: DC

## 2016-07-08 MED ORDER — CITALOPRAM HYDROBROMIDE 20 MG PO TABS
20.0000 mg | ORAL_TABLET | Freq: Every day | ORAL | Status: DC
  Administered 2016-07-09: 20 mg via ORAL
  Filled 2016-07-08: qty 1
  Filled 2016-07-08: qty 21
  Filled 2016-07-08: qty 1

## 2016-07-08 MED ORDER — GABAPENTIN 400 MG PO CAPS: 400.0000 mg | ORAL_CAPSULE | Freq: Three times a day (TID) | ORAL | 0 refills | Status: DC

## 2016-07-08 MED ORDER — CITALOPRAM HYDROBROMIDE 20 MG PO TABS: 20.0000 mg | ORAL_TABLET | Freq: Every day | ORAL | 0 refills | Status: DC

## 2016-07-08 MED ORDER — ENSURE ENLIVE PO LIQD
237.0000 mL | Freq: Three times a day (TID) | ORAL | Status: DC
  Administered 2016-07-08 – 2016-07-09 (×4): 237 mL via ORAL

## 2016-07-08 MED ORDER — ARIPIPRAZOLE 2 MG PO TABS: 2.0000 mg | ORAL_TABLET | Freq: Every day | ORAL | 0 refills | Status: DC

## 2016-07-08 NOTE — Discharge Summary (Signed)
Physician Discharge Summary Note  Patient:  Marcus [name] is an 40 y.o., male MRN:  [id number] DOB:  11/14/1975 Patient phone:  [phone] (home)  Patient address:   [address] [place] [id number],  Total Time spent with patient: 30 minutes  Date of Admission:  07/02/2016 Date of Discharge: 07/09/2016  Reason for Admission:  Suicidal ideation  Principal Problem: Polysubstance dependence including opioid type drug without complication, episodic abuse [facility](HCC) Discharge Diagnoses: Patient Active Problem List   Diagnosis Date Noted  . Polysubstance dependence including opioid type drug without complication, episodic abuse [facility](HCC) [F11.220] 07/02/2016    Past Psychiatric History: see HPI  Past Medical History:  Past Medical History:  Diagnosis Date  . Hepatitis C    History reviewed. No pertinent surgical history. Family History: History reviewed. No pertinent family history. Family Psychiatric  History: swee HPI Social History:  History  Alcohol Use No     History  Drug Use  . Frequency: 7.0 times per week  . Types: Methamphetamines, Heroin    Comment: Daily use where possible    Social History   Social History  . Marital status: Married    Spouse name: N/A  . Number of children: N/A  . Years of education: N/A   Social History Main Topics  . Smoking status: Current Every Day Smoker  . Smokeless tobacco: Never Used  . Alcohol use No  . Drug use:     Frequency: 7.0 times per week    Types: Methamphetamines, Heroin     Comment: Daily use where possible  . Sexual activity: Not Currently   Other Topics Concern  . None   Social History Narrative  . None    Hospital Course:  Marcus [name] is a 40 y.o. male who [facility] with complaint of suicidal ideation and other depressive symptoms.  Pt reported as follows:  He stated that recently he has been in 'a downward spiral' of substance use (most recently heroin, meth, opioids, but "I've  used everything") and depression.  He endorsed suicidal ideation without current plan.    Marcus [name] was admitted for Polysubstance dependence including opioid type drug without complication, episodic abuse (HCC) and crisis management.  Patient was treated with medications with their indications listed below in detail under Medication List.  Medical problems were identified and treated as needed.  Home medications were restarted as appropriate.  Improvement was monitored by observation and Marcus [name] daily report of symptom reduction.  Emotional and mental status was monitored by daily self inventory reports completed by Marcus [name] and clinical staff.  Patient reported continued improvement, denied any new concerns.  Patient had been compliant on medications and denied side effects.  Support and encouragement was provided.    Patient encouraged to attend groups to help with recognizing triggers of emotional crises and de-stabilizations.  Patient encouraged to attend group to help identify the positive things in life that would help in dealing with feelings of loss, depression and unhealthy or abusive tendencies.         Marcus [name] was evaluated by the treatment team for stability and plans for continued recovery upon discharge.  Patient was offered further treatment options upon discharge including Residential, Intensive Outpatient and Outpatient treatment. Patient will follow up with agency listed below for medication management and counseling.  Encouraged patient to maintain satisfactory support network and home environment.  Advised to adhere to medication compliance and outpatient treatment follow up.  Prescriptions  provided.       Upon completion of this admission the patient was both mentally and medically stable for discharge denying suicidal/homicidal ideation, auditory/visual/tactile hallucinations, delusional thoughts and paranoia.      Physical Findings: AIMS: Facial and Oral  Movements Muscles of Facial Expression: None, normal Lips and Perioral Area: None, normal Jaw: None, normal Tongue: None, normal,Extremity Movements Upper (arms, wrists, hands, fingers): None, normal Lower (legs, knees, ankles, toes): None, normal, Trunk Movements Neck, shoulders, hips: None, normal, Overall Severity Severity of abnormal movements (highest score from questions above): None, normal Incapacitation due to abnormal movements: None, normal Patient's awareness of abnormal movements (rate only patient's report): No Awareness, Dental Status Current problems with teeth and/or dentures?: No Does patient usually wear dentures?: No  CIWA:  CIWA-Ar Total: 1 COWS:  COWS Total Score: 2  Musculoskeletal: Strength & Muscle Tone: within normal limits Gait & Station: normal Patient leans: N/A  Psychiatric Specialty Exam: Physical Exam  Nursing note and vitals reviewed. Psychiatric: He has a normal mood and affect. His behavior is normal. Judgment and thought content normal. Thought content is not paranoid. He expresses no homicidal and no suicidal ideation.    Review of Systems  All other systems reviewed and are negative.   Blood pressure 107/74, pulse 86, temperature 98.6 F (37 C), temperature source Oral, resp. rate 18, height 5\' 11"  (1.803 m), weight 70.8 kg (156 lb), SpO2 100 %.Body mass index is 21.76 kg/m.    Have you used any form of tobacco in the last 30 days? (Cigarettes, Smokeless Tobacco, Cigars, and/or Pipes): Yes  Has this patient used any form of tobacco in the last 30 days? (Cigarettes, Smokeless Tobacco, Cigars, and/or Pipes) Yes, N/A  Blood Alcohol level:  Lab Results  Component Value Date   ETH <5 07/02/2016    Metabolic Disorder Labs:  Lab Results  Component Value Date   HGBA1C 5.0 07/06/2016   MPG 97 07/06/2016   Lab Results  Component Value Date   PROLACTIN 4.2 07/06/2016   Lab Results  Component Value Date   CHOL 162 07/06/2016   TRIG 82  07/06/2016   HDL 59 07/06/2016   CHOLHDL 2.7 07/06/2016   VLDL 16 07/06/2016   LDLCALC 87 07/06/2016    See Psychiatric Specialty Exam and Suicide Risk Assessment completed by Attending Physician prior to discharge.  Discharge destination:  Home  Is patient on multiple antipsychotic therapies at discharge:  No   Has Patient had three or more failed trials of antipsychotic monotherapy by history:  No  Recommended Plan for Multiple Antipsychotic Therapies: NA     Medication List    TAKE these medications     Indication  ARIPiprazole 2 MG tablet Commonly known as:  ABILIFY Take 1 tablet (2 mg total) by mouth daily. Start taking on:  07/09/2016  Indication:  mood stabilization   citalopram 20 MG tablet Commonly known as:  CELEXA Take 1 tablet (20 mg total) by mouth daily. Start taking on:  07/09/2016  Indication:  Depression   gabapentin 400 MG capsule Commonly known as:  NEURONTIN Take 1 capsule (400 mg total) by mouth 3 (three) times daily.  Indication:  Agitation   nicotine 21 mg/24hr patch Commonly known as:  NICODERM CQ - dosed in mg/24 hours Place 1 patch (21 mg total) onto the skin daily. Start taking on:  07/09/2016  Indication:  Nicotine Addiction   traZODone 50 MG tablet Commonly known as:  DESYREL Take 1 tablet (50 mg total)  by mouth at bedtime as needed for sleep.  Indication:  Trouble Sleeping      Follow-up Information    [facility] Follow up.   Specialty:  Behavioral Health Why:  Walk in between 8am-9am Monday through Friday for hospital follow-up/medication management/assessment for counseling services.  Contact information: [address] [place] [address] [phone]        [facility] Follow up on 07/14/2016.   Why:  Screening for possible admission on this date at 11:45AM. Please bring proof of [place] residency/Medicaid card to this appt. Thank you.  Contact information: [address] [place]  [address] [phone]        [facility] Follow up.   Why:  You have been accepted for admission on this date. Driver from [place] will pick you up at 9:00-9:15AM to transport you directly to facility. 21 day supply needed. Thank you.  Contact information: [address] [place], [place] [address] Phone: [phone] Fax: [phone]; [phone]          Follow-up recommendations:  Activity:  as tol Diet:  as tol  Comments:  1.  Take all your medications as prescribed.   2.  Report any adverse side effects to outpatient provider. 3.  Patient instructed to not use alcohol or illegal drugs while on prescription medicines. 4.  In the event of worsening symptoms, instructed patient to call 911, the crisis hotline or go to nearest emergency room for evaluation of symptoms.  Signed: Lindwood Qua, NP [facility] 07/08/2016, 4:12 PM

## 2016-07-08 NOTE — Progress Notes (Signed)
Pt has been observed in the dayroom most of the evening playing cards with peers.  He reports he has had a good day.  He denies SI/HI/AVH.  [facility].  He makes his needs known to staff.  He voices no needs or concerns at this time, other than being able to get Ibuprofen for his chronic joint pain when it is next available.  Support and encouragement offered.  Discharge plans are in process.  Safety maintained with q15 minute checks.

## 2016-07-08 NOTE — Progress Notes (Signed)
Adult Psychoeducational Group Note  Date:  07/08/2016 Time:  9:38 PM  Group Topic/Focus:  Wrap-Up Group:   The focus of this group is to help patients review their daily goal of treatment and discuss progress on daily workbooks.   Participation Level:  Active  Participation Quality:  Appropriate  Affect:  Appropriate  Cognitive:  Alert  Insight: Appropriate  Engagement in Group:  Engaged  Modes of Intervention:  Discussion  Additional Comments:  Patient states, "I had a good day". On a scale between 1-10, (1=worse, 10=best) patient rated his day a 10.  Karinna Beadles L Kabrina Christiano 07/08/2016, 9:38 PM

## 2016-07-08 NOTE — Plan of Care (Signed)
Problem: Activity: Goal: Interest or engagement in leisure activities will improve Outcome: Progressing Nurse discussed anxiety/depression/coping skills with patient.

## 2016-07-08 NOTE — BHH Group Notes (Signed)
[facility] Group Notes:  (Nursing/MHT/Case Management/Adjunct)  Date:  07/08/2016  Time:  10:46 AM  Type of Therapy:  Psychoeducational Skills  Participation Level:  Active  Participation Quality:  Appropriate  Affect:  Blunted  Cognitive:  Appropriate  Insight:  Appropriate  Engagement in Group:  Engaged  Modes of Intervention:  Discussion and Education  Summary of Progress/Problems: [name] attended group.   Marzetta BoardDopson, Zaida Reiland E 07/08/2016, 10:46 AM

## 2016-07-08 NOTE — [facility] Suicide Risk Assessment (Signed)
[facility]   Principal Problem: Polysubstance dependence including opioid type drug without complication, episodic abuse [facility](HCC) Discharge Diagnoses:  Patient Active Problem List   Diagnosis Date Noted  . Polysubstance dependence including opioid type drug without complication, episodic abuse [facility](HCC) [F11.220] 07/02/2016    Total Time spent with patient: 15 minutes  Musculoskeletal: Strength & Muscle Tone: within normal limits Gait & Station: normal Patient leans: N/A  Psychiatric Specialty Exam: ROS  Blood pressure 107/74, pulse 86, temperature 98.6 F (37 C), temperature source Oral, resp. rate 18, height 5\' 11"  (1.803 m), weight 70.8 kg (156 lb), SpO2 100 %.Body mass index is 21.76 kg/m.  General Appearance: Casual  Eye Contact::  Good  Speech:  Clear and Coherent409  Volume:  Normal  Mood:  Anxious  Affect:  Congruent  Thought Process:  Coherent  Orientation:  Full (Time, Place, and Person)  Thought Content:  Negative  Suicidal Thoughts:  No  Homicidal Thoughts:  No  Memory:  Negative  Judgement:  Fair  Insight:  Fair  Psychomotor Activity:  Normal  Concentration:  Good  Recall:  Good  Fund of Knowledge:Good  Language: Good  Akathisia:  No  Handed:  Right  AIMS (if indicated):     Assets:  Resilience  Sleep:  Number of Hours: 6.75  Cognition: WNL  ADL's:  Intact   Mental Status Per Nursing Assessment::   On Admission:  Suicidal ideation indicated by patient, Suicide plan, Plan includes specific time, place, or method, Self-harm thoughts, Self-harm behaviors  Demographic Factors:  Male, Caucasian and Unemployed  Loss Factors: Decrease in vocational status and Loss of significant relationship  Historical Factors: NA  Risk Reduction Factors:   Positive coping skills or problem solving skills  Continued Clinical Symptoms:  Alcohol/Substance Abuse/Dependencies  Cognitive Features That Contribute To Risk:  None    Suicide  Risk:  Mild:  Suicidal ideation of limited frequency, intensity, duration, and specificity.  There are no identifiable plans, no associated intent, mild dysphoria and related symptoms, good self-control (both objective and subjective assessment), few other risk factors, and identifiable protective factors, including available and accessible social support.  Follow-up Information    [facility] .   Specialty:  Behavioral Health Why:  Walk in between 8am-9am Monday through Friday for hospital follow-up/medication management/assessment for counseling services.  Contact information: [address] [place] [address] [phone]        [facility] Follow up on 07/14/2016.   Why:  Screening for possible admission on this date at 11:45AM. Please bring proof of [place] residency/Medicaid card to this appt. Thank you.  Contact information: [place] [place] [address] [phone]        [facility] .   Contact information: [address] [place], [place] [address] Phone: [phone] Fax: [phone]          Plan Of Care/Follow-up recommendations:  Other:  PT denies any acute suicidal or homicidal ideation and or intent at time of discharge  Marcus SitElizabeth Woods Shaunna Rosetti, MD 07/08/2016, 3:23 PM

## 2016-07-08 NOTE — Progress Notes (Signed)
Recreation Therapy Notes  Animal-Assisted Activity (AAA) Program Checklist/Progress Notes Patient Eligibility Criteria Checklist & Daily Group note for Rec TxIntervention  Date: 11.07.2017 Time: 2:45pm [address]    AAA/T Program Assumption of Risk Form signed by Patient/ or Parent Legal Guardian Yes  Patient is free of allergies or sever asthma Yes  Patient reports no fear of animals Yes  Patient reports no history of cruelty to animals Yes  Patient understands his/her participation is voluntary Yes  Patient washes hands before animal contact Yes  Patient washes hands after animal contact Yes  Behavioral Response: Engaged, Attentive.   Education:Hand Washing, Appropriate Animal Interaction   Education Outcome: Acknowledges education.   Clinical Observations/Feedback: Patient attended session and interacted appropriately with therapy dog and peers. Patient asked appropriate questions about therapy dog and his training. Patient shared stories about their pets at home with group.   Marykay Lexenise L Katisha Shimizu, LRT/CTRS        Telesforo Brosnahan L 07/08/2016 3:00 PM

## 2016-07-08 NOTE — Progress Notes (Signed)
D:  Patient's self inventory sheet, patient sleeps good, sleep medication is helpful.  Good appetite, normal energy level, poor concentration.  Rated depression 5, hopeless 7, anxiety  4.  Denied withdrawals.  Denied SI.  Physical problems, neck, back, headaches. Worst pain   #8 in past 24 hours.  Pain medication is not helpful.  Goal is no smoking, group, me.  Plans not to smoke, do  What is recommended by staff.  Needs clothes.  Does have discharge plans. A:  Medications administered per MD order.  Emotional support and encouragement given patient. R:  Denied SI and HI.  Contracts for safety.  Denied A/V hallucinations.  Safety maintained with 15 minute checks.

## 2016-07-08 NOTE — Progress Notes (Signed)
[facility] Progress [facility]  07/08/2016 12:11 PM  Patient Active Problem List   Diagnosis Date Noted  . Polysubstance dependence including opioid type drug without complication, episodic abuse (HCC) 07/02/2016    Diagnosis: Polysubstance use disorder  Subjective: Patient reports he is not sure the medications he was started on are working. He states that typically SSRIs do not work for him but after discussion he agrees to go ahead and raise the Celexa at this time. We will also increase the gabapentin for complaints of chronic pain to 400 mg by mouth 3 times a day. At this time we will continue the Abilify as before. Patient reports he has received a list of [facility] and has not yet called them although he has underlined or selected the ones that he will call tonight. The patient would wish to stay in the hospital to transition directly to a program but he has declined some programs that it was explained to him that is not always possible to be housed here in total program becomes available.  Objective: Well-developed well-nourished male in no apparent distress he is pleasant but a bit intrusive and tends to move rather close to  personal space, mood is described as somewhat depressed and anxious and affect is mildly anxious, speech and motor are within normal limits, thought processes linear and goal directed, but content denies any current homicidal or suicidal ideation, plan or intent, alert and oriented 3 IQ appears an average range insight and judgment are fair         Current Facility-Administered Medications (Analgesics):  .  ibuprofen (ADVIL,MOTRIN) tablet 400 mg     Current Facility-Administered Medications (Other):  .  alum & mag hydroxide-simeth (MAALOX/MYLANTA) 200-200-20 MG/5ML suspension 30 mL .  ARIPiprazole (ABILIFY) tablet 2 mg .  [START ON 07/09/2016] citalopram (CELEXA) tablet 20 mg .  feeding supplement (ENSURE ENLIVE) (ENSURE ENLIVE) liquid 237 mL .  gabapentin  (NEURONTIN) capsule 400 mg .  hydrOXYzine (ATARAX/VISTARIL) tablet 50 mg .  magnesium hydroxide (MILK OF MAGNESIA) suspension 30 mL .  nicotine (NICODERM CQ - dosed in mg/24 hours) patch 21 mg .  ondansetron (ZOFRAN-ODT) disintegrating tablet 4 mg .  traZODone (DESYREL) tablet 50 mg  No current outpatient prescriptions on file.  Vital Signs:Blood pressure 107/74, pulse 86, temperature 98.6 F (37 C), temperature source Oral, resp. rate 18, height 5\' 11"  (1.803 m), weight 70.8 kg (156 lb), SpO2 100 %.    Lab Results: No results found for this or any previous visit (from the past 48 hour(s)).  Physical Findings: AIMS: Facial and Oral Movements Muscles of Facial Expression: None, normal Lips and Perioral Area: None, normal Jaw: None, normal Tongue: None, normal,Extremity Movements Upper (arms, wrists, hands, fingers): None, normal Lower (legs, knees, ankles, toes): None, normal, Trunk Movements Neck, shoulders, hips: None, normal, Overall Severity Severity of abnormal movements (highest score from questions above): None, normal Incapacitation due to abnormal movements: None, normal Patient's awareness of abnormal movements (rate only patient's report): No Awareness, Dental Status Current problems with teeth and/or dentures?: No Does patient usually wear dentures?: No  CIWA:  CIWA-Ar Total: 1 COWS:  COWS Total Score: 2   Assessment/Plan: Patient's medications adjusted as per above. Patient expresses anxiety about moving out of the hospital and we are working with him to try to make sure there are some supports in place rather than him returning to the same environment which would probably not be conducive to recovery. Plan is for patient to discharge  tomorrow after having a chance to further work on housing and follow-up options and he will be encouraged to stay with outpatient psychiatric follow-up to further adjust his medications.  Acquanetta SitElizabeth Woods Atasha Colebank, MD 07/08/2016, 12:11 PM

## 2016-07-08 NOTE — Progress Notes (Signed)
  [facility] :  Will you be returning to the same living situation after discharge:  No. Pt accepted to [facility].  At discharge, do you have transportation home?: Yes,  [facility] driver will pick him up at 10-10:15AM on Wed 11/8 Do you have the ability to pay for your medications: Yes,  [facility]  Release of information consent forms completed and submitted to medical records by CSW.  Patient to Follow up at: Follow-up Information    [facility] Follow up.   Specialty:  Behavioral Health Why:  Walk in between 8am-9am Monday through Friday for hospital follow-up/medication management/assessment for counseling services.  Contact information: [address] [place] [address] [phone]        [facility] Follow up on 07/14/2016.   Why:  Screening for possible admission on this date at 11:45AM. Please bring proof of [place] residency/Medicaid card to this appt. Thank you.  Contact information: [address] [place] [address] [phone]        [facility] Follow up.   Why:  You have been accepted for admission on this date. [facility] 10:15AM to transport you directly to facility. 21 day supply needed. Thank you.  Contact information: [address] [place], [place] [address] Phone: [phone] Fax: [phone]; [phone]          Next level of care provider has [facility] to [facility] Link:no  Safety Planning and Suicide Prevention discussed: Yes,  SPE completed with pt; pt declined to consent to family contact.   Have you used any form of tobacco in the last 30 days? (Cigarettes, Smokeless Tobacco, Cigars, and/or Pipes): Yes  Has patient been referred to the Quitline?: Patient refused referral  Patient has been referred for addiction treatment: Yes  Larosa Rhines N Smart LCSW 07/08/2016, 4:28 PM

## 2016-07-08 NOTE — Progress Notes (Signed)
[facility]. [facility]. Possible bed available for tomorrow.   Trula SladeHeather Smart, MSW, LCSW Clinical Social Worker 07/08/2016 3:56 PM

## 2016-07-08 NOTE — BHH Group Notes (Signed)
[facility] LCSW Group Therapy  07/08/2016 2:18 PM  Type of Therapy:  Group Therapy  Participation Level:  Active  Participation Quality:  Attentive  Affect:  Appropriate  Cognitive:  Alert and Oriented  Insight:  Improving  Engagement in Therapy:  Improving  Modes of Intervention:  Discussion, Education, Rapport Building, Socialization and Support  Summary of Progress/Problems: [facility] Speaker came to talk about his personal journey with substance abuse and addiction. The pt processed ways by which to relate to the speaker. [facility]HA speaker provided handouts and educational information pertaining to groups and services offered by the [facility].   Abri Vacca N Smart LCSW 07/08/2016, 2:18 PM

## 2016-07-08 NOTE — Tx Team (Signed)
Interdisciplinary Treatment and Diagnostic Plan Update  07/08/2016 Time of Session: 9:30AM Marcus [name] MRN: [id number]  Principal Diagnosis: Polysubstance dependence Secondary Diagnoses: Principal Problem:   Polysubstance dependence including opioid type drug without complication, episodic abuse ([place])   Current Medications:  Current Facility-Administered Medications  Medication Dose Route Frequency Provider Last Rate Last Dose  . alum & mag hydroxide-simeth (MAALOX/MYLANTA) 200-200-20 MG/5ML suspension 30 mL  30 mL Oral Q4H PRN Ethelene Hal, NP   30 mL at 07/04/16 1829  . ARIPiprazole (ABILIFY) tablet 2 mg  2 mg Oral Daily Kerrie Buffalo, NP   2 mg at 07/08/16 0749  . [START ON 07/09/2016] citalopram (CELEXA) tablet 20 mg  20 mg Oral Daily Linard Millers, MD      . feeding supplement (ENSURE ENLIVE) (ENSURE ENLIVE) liquid 237 mL  237 mL Oral TID BM Linard Millers, MD      . gabapentin (NEURONTIN) capsule 400 mg  400 mg Oral TID Linard Millers, MD      . hydrOXYzine (ATARAX/VISTARIL) tablet 50 mg  50 mg Oral Q4H PRN Kerrie Buffalo, NP   50 mg at 07/07/16 2133  . ibuprofen (ADVIL,MOTRIN) tablet 400 mg  400 mg Oral Q6H PRN Linard Millers, MD   400 mg at 07/08/16 0759  . magnesium hydroxide (MILK OF MAGNESIA) suspension 30 mL  30 mL Oral Daily PRN Ethelene Hal, NP      . nicotine (NICODERM CQ - dosed in mg/24 hours) patch 21 mg  21 mg Transdermal Daily Laverle Hobby, PA-C   21 mg at 07/08/16 0750  . ondansetron (ZOFRAN-ODT) disintegrating tablet 4 mg  4 mg Oral Q8H PRN Linard Millers, MD   4 mg at 07/04/16 1558  . traZODone (DESYREL) tablet 50 mg  50 mg Oral QHS PRN Ethelene Hal, NP   50 mg at 07/07/16 2133   PTA Medications: No prescriptions prior to admission.    Patient Stressors: Financial difficulties Marital or family conflict Occupational concerns Substance abuse  Patient Strengths: Ability for Chiropodist for treatment/growth Supportive family/friends  Treatment Modalities: Medication Management, Group therapy, Case management,  1 to 1 session with clinician, Psychoeducation, Recreational therapy.   Physician Treatment Plan for Primary Diagnosis: Polysubstance dependence Long Term Goal(s):     Short Term Goals:    Medication Management: Evaluate patient's response, side effects, and tolerance of medication regimen.  Therapeutic Interventions: 1 to 1 sessions, Unit Group sessions and Medication administration.  Evaluation of Outcomes: Met  Physician Treatment Plan for Secondary Diagnosis: Principal Problem:   Polysubstance dependence including opioid type drug without complication, episodic abuse ([place])  Long Term Goal(s):     Short Term Goals:       Medication Management: Evaluate patient's response, side effects, and tolerance of medication regimen.  Therapeutic Interventions: 1 to 1 sessions, Unit Group sessions and Medication administration.  Evaluation of Outcomes: Met   RN Treatment Plan for Primary Diagnosis: Polysubstance dependence Long Term Goal(s): Knowledge of disease and therapeutic regimen to maintain health will improve  Short Term Goals: Ability to remain free from injury will improve, Ability to disclose and discuss suicidal ideas and Ability to identify and develop effective coping behaviors will improve  Medication Management: RN will administer medications as ordered by provider, will assess and evaluate patient's response and provide education to patient for prescribed medication. RN will report any adverse and/or side effects to prescribing provider.  Therapeutic Interventions: 1 on 1 counseling  sessions, Psychoeducation, Medication administration, Evaluate responses to treatment, Monitor vital signs and CBGs as ordered, Perform/monitor CIWA, COWS, AIMS and Fall Risk screenings as ordered, Perform wound care treatments as  ordered.  Evaluation of Outcomes: Met   LCSW Treatment Plan for Primary Diagnosis: Polysubstance dependence Long Term Goal(s): Safe transition to appropriate next level of care at discharge, Engage patient in therapeutic group addressing interpersonal concerns.  Short Term Goals: Engage patient in aftercare planning with referrals and resources, Facilitate patient progression through stages of change regarding substance use diagnoses and concerns and Identify triggers associated with mental health/substance abuse issues  Therapeutic Interventions: Assess for all discharge needs, 1 to 1 time with Social worker, Explore available resources and support systems, Assess for adequacy in community support network, Educate family and significant other(s) on suicide prevention, Complete Psychosocial Assessment, Interpersonal group therapy.  Evaluation of Outcomes: Met  Progress in Treatment: Attending groups: Yes Participating in groups: Yes Taking medication as prescribed: Yes. Toleration medication: Yes. Family/Significant other contact made: SPE completed with pt; pt declined to consent to family contact.  Patient understands diagnosis: Yes. Discussing patient identified problems/goals with staff: Yes. Medical problems stabilized or resolved: Yes. Denies suicidal/homicidal ideation: Yes, per self report.  Issues/concerns per patient self-inventory: No.  Other: n/a   New problem(s) identified: No, Describe:  n/a  New Short Term/Long Term Goal(s): medication stabilization, detox, referral to appropriate aftercare.   Discharge Plan or Barriers: Pt accepted to [facility] for Wed 11/8 at 10:15AM. 21 day supply of medications required.   Reason for Continuation of Hospitalization: Medication management   Estimated Length of Stay: d/c scheduled for Wed morning. [facility] pickup at 10:15AM.   Attendees: Patient: 07/08/2016 10:05 AM  Physician: Dr. Odelia Gage MD 07/08/2016 10:05 AM  Nursing:  Quintin Alto RN 07/08/2016 10:05 AM  RN Care Manager: 07/08/2016 10:05 AM  Social Worker: Maxie Better, LCSW 07/08/2016 10:05 AM  Recreational Therapist:  07/08/2016 10:05 AM  Other: Catalina Pizza NP; May Augustin NP 07/08/2016 10:05 AM  Other:  07/08/2016 10:05 AM  Other: 07/08/2016 10:05 AM    Scribe for Treatment Team: [place], LCSW 07/08/2016 10:05 AM

## 2016-07-09 NOTE — Progress Notes (Signed)
Received discharge orders.  Reviewed medications, discharge instructions, and follow up with patient.  Patient verbalized understanding.  Denies SI, HI, and AVH.  Agrees to contact someone or 911 if she has thoughts/intent to harm self or others.    Medication samples (21 day supply) and scripts handed to patient.  Paperwork, AVS, SRA, and transition record handed to patient.   Escorted off of unit at 1013. Belongings returned per belongings form.  Discharged to lobby where he was met by transport to take him to [facility].  To follow up per AVS.

## 2016-07-09 NOTE — Progress Notes (Signed)
Recreation Therapy Notes  Date:  07/09/16 Time: 0930 Location: 300 Hall Dayroom  Group Topic: Stress Management  Goal Area(s) Addresses:  Patient will verbalize importance of using healthy stress management.  Patient will identify positive emotions associated with healthy stress management.   Behavioral Response: Engaged  Intervention: Calm App  Activity :  Forgiveness of Self Imagery.  LRT introduced the concept of guided imagery.  LRT played an imagery meditation from the Calm app so patients could engage and participate in the activity.  Patients were to follow along with the recording to participate in the activity.    Education:  Stress Management, Discharge Planning.   Education Outcome: Acknowledges edcuation/In group clarification offered/Needs additional education  Clinical Observations/Feedback: Pt attended group.    Caroll RancherMarjette Jadwiga Faidley, LRT/CTRS         Caroll RancherLindsay, Quantia Grullon A 07/09/2016 11:45 AM

## 2016-07-09 NOTE — Progress Notes (Signed)
[facility]  07/09/2016 2:16 PM Marcus ReidDana [name]  MRN:  [id number]   Subjective: Denies any current suicidal or homicidal ideation, plan or intent denies any psychotic symptoms. Plan is to monitor patient over the weekend for stability and patient is in agreement with this plan.  He is interested in seeking longer detox rehab program.  Objective:  He is pleasant, calm and cooperative.  He recently lost his job and felt "down and out".  [place] he states that he can remember being depressed since he was 40 years old.  He had never been on prescription psychotropics but admitted to using Zoloft and Prozac back in the late 1990's ( a friend's RX, "made me feel numb").    Principal Problem: Polysubstance dependence including opioid type drug without complication, episodic abuse (H[facility]) Diagnosis:   Patient Active Problem List   Diagnosis Date Noted  . Polysubstance dependence including opioid type drug without complication, episodic abuse [facility](HCC) [F11.220] 07/02/2016   Total Time spent with patient: 30 minutes  Past Psychiatric History:   Past Medical History:  Past Medical History:  Diagnosis Date  . Hepatitis C    History reviewed. No pertinent surgical history. Family History: History reviewed. No pertinent family history. Family Psychiatric  History:  Social History:  History  Alcohol Use No     History  Drug Use  . Frequency: 7.0 times per week  . Types: Methamphetamines, Heroin    Comment: Daily use where possible    Social History   Social History  . Marital status: Married    Spouse name: N/A  . Number of children: N/A  . Years of education: N/A   Social History Main Topics  . Smoking status: Current Every Day Smoker  . Smokeless tobacco: Never Used  . Alcohol use No  . Drug use:     Frequency: 7.0 times per week    Types: Methamphetamines, Heroin     Comment: Daily use where possible  . Sexual activity: Not Currently   Other Topics Concern  . None    Social History Narrative  . None   Additional Social History:  Specify valuables returned: see belongings record Pain Medications: See PTA Prescriptions: See PTA Over the Counter: See PTA History of alcohol / drug use?: Yes Name of Substance 1: Meth 1 - Frequency: Daily 1 - Duration: Ongoing 1 - Last Use / Amount: 06/23/16 Name of Substance 2: Heroin 2 - Amount (size/oz): Varied 2 - Frequency: Daily where possible 2 - Duration: Ongoing 2 - Last Use / Amount: 06/01/16 Name of Substance 3: Opioids    Sleep: Fair  Appetite:  Fair  Current Medications: No current facility-administered medications for this encounter.    Current Outpatient Prescriptions  Medication Sig Dispense Refill  . ARIPiprazole (ABILIFY) 2 MG tablet Take 1 tablet (2 mg total) by mouth daily. 30 tablet 0  . citalopram (CELEXA) 20 MG tablet Take 1 tablet (20 mg total) by mouth daily. 30 tablet 0  . gabapentin (NEURONTIN) 400 MG capsule Take 1 capsule (400 mg total) by mouth 3 (three) times daily. 90 capsule 0  . nicotine (NICODERM CQ - DOSED IN MG/24 HOURS) 21 mg/24hr patch Place 1 patch (21 mg total) onto the skin daily. 28 patch 0  . traZODone (DESYREL) 50 MG tablet Take 1 tablet (50 mg total) by mouth at bedtime as needed for sleep. 30 tablet 0    Lab Results:  No results found for this or any previous  visit (from the past 48 hour(s)).  Blood Alcohol level:  Lab Results  Component Value Date   ETH <5 07/02/2016    Metabolic Disorder Labs: Lab Results  Component Value Date   HGBA1C 5.0 07/06/2016   MPG 97 07/06/2016   Lab Results  Component Value Date   PROLACTIN 4.2 07/06/2016   Lab Results  Component Value Date   CHOL 162 07/06/2016   TRIG 82 07/06/2016   HDL 59 07/06/2016   CHOLHDL 2.7 07/06/2016   VLDL 16 07/06/2016   LDLCALC 87 07/06/2016    Physical Findings: AIMS: Facial and Oral Movements Muscles of Facial Expression: None, normal Lips and Perioral Area: None,  normal Jaw: None, normal Tongue: None, normal,Extremity Movements Upper (arms, wrists, hands, fingers): None, normal Lower (legs, knees, ankles, toes): None, normal, Trunk Movements Neck, shoulders, hips: None, normal, Overall Severity Severity of abnormal movements (highest score from questions above): None, normal Incapacitation due to abnormal movements: None, normal Patient's awareness of abnormal movements (rate only patient's report): No Awareness, Dental Status Current problems with teeth and/or dentures?: No Does patient usually wear dentures?: No  CIWA:  CIWA-Ar Total: 1 COWS:  COWS Total Score: 2  Musculoskeletal: Strength & Muscle Tone: within normal limits Gait & Station: normal Patient leans: N/A  Psychiatric Specialty Exam: Physical Exam  Nursing note and vitals reviewed. Constitutional: He appears well-nourished.  Cardiovascular: Regular rhythm.   Skin: Skin is warm.  Psychiatric: He has a normal mood and affect. His behavior is normal. Judgment and thought content normal.    Review of Systems  Psychiatric/Behavioral: Positive for depression. Negative for substance abuse and suicidal ideas. The patient is nervous/anxious.     Blood pressure 110/74, pulse 77, temperature 98 F (36.7 C), temperature source Oral, resp. rate 16, height 5\' 11"  (1.803 m), weight 70.8 kg (156 lb), SpO2 100 %.Body mass index is 21.76 kg/m.  General Appearance: Casual  Eye Contact:  Fair  Speech:  Clear and Coherent  Volume:  Normal  Mood:  Anxious and Euphoric  Affect:  Congruent  Thought Process:  Coherent  Orientation:  Full (Time, Place, and Person)  Thought Content:  Hallucinations: None  Suicidal Thoughts:  No  Homicidal Thoughts:  No  Memory:  Immediate;   Fair Recent;   Fair Remote;   Fair  Judgement:  Fair  Insight:  Fair  Psychomotor Activity:  Normal  Concentration:  Concentration: Fair  Recall:  [name]  Fund of Knowledge:  Fair  Language:  Fair  Akathisia:  No   Handed:  Right  AIMS (if indicated):     Assets:  Communication Skills Intimacy Social Support Talents/Skills  ADL's:  Intact  Cognition:  WNL  Sleep:  Number of Hours: 6.75   I agree with current treatment plan on 07/06/2016, Patient seen face-to-face for psychiatric evaluation follow-up, chart reviewed. Reviewed the information documented and agree with the treatment plan.  Treatment Plan Summary: Daily contact with patient to assess and evaluate symptoms and progress in treatment and Medication management Start Abilify trial 2 mg for anxiety and depression.  Celexa 10 mg depression.  We will continue to monitor. Continue with Trazodone 50 mg for insomnia Will continue to monitor vitals ,medication compliance and treatment side effects while patient is here.  Reviewed labs: elevated ,BAL - , UDS - [facility] will start working on disposition.  Patient to participate in therapeutic milieu  Lindwood QuaSheila May Winslow Verrill, NP [facility] 07/09/2016, 2:16 PM

## 2016-07-09 NOTE — Progress Notes (Signed)
Pt reports he had a good day.  He denies SI/HI/AVH.  He denies any withdrawal symptoms at this time.  He reports that the Ibuprofen is sufficient for his arthritic pain at this time.  [facility].  He has been pleasant and cooperative.  He interacts appropriately with his peers and spends most of his time in the dayroom.  Support and encouragement offered.  Pt makes his needs known to staff.  Discharge plans are in process.  Safety maintained with q15 minute checks.

## 2016-07-11 ENCOUNTER — Encounter: Payer: Self-pay | Admitting: Psychiatry
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
## 2017-01-29 ENCOUNTER — Encounter (HOSPITAL_COMMUNITY): Payer: Self-pay | Admitting: Emergency Medicine

## 2017-01-29 ENCOUNTER — Emergency Department (HOSPITAL_COMMUNITY)
Admission: EM | Admit: 2017-01-29 | Discharge: 2017-01-30 | Disposition: A | Discharge status: Home / Self Care | Payer: Medicaid Other | Attending: Emergency Medicine | Admitting: Emergency Medicine

## 2017-01-29 DIAGNOSIS — R45851 Suicidal ideations: Secondary | ICD-10-CM

## 2017-01-29 DIAGNOSIS — F17200 Nicotine dependence, unspecified, uncomplicated: Secondary | ICD-10-CM | POA: Insufficient documentation

## 2017-01-29 DIAGNOSIS — F1120 Opioid dependence, uncomplicated: Secondary | ICD-10-CM | POA: Insufficient documentation

## 2017-01-29 DIAGNOSIS — F1910 Other psychoactive substance abuse, uncomplicated: Secondary | ICD-10-CM

## 2017-01-29 DIAGNOSIS — F1920 Other psychoactive substance dependence, uncomplicated: Secondary | ICD-10-CM | POA: Diagnosis present

## 2017-01-29 DIAGNOSIS — Z79899 Other long term (current) drug therapy: Secondary | ICD-10-CM | POA: Insufficient documentation

## 2017-01-29 DIAGNOSIS — F1210 Cannabis abuse, uncomplicated: Secondary | ICD-10-CM | POA: Insufficient documentation

## 2017-01-29 DIAGNOSIS — F1190 Opioid use, unspecified, uncomplicated: Secondary | ICD-10-CM | POA: Diagnosis not present

## 2017-01-29 DIAGNOSIS — F1590 Other stimulant use, unspecified, uncomplicated: Secondary | ICD-10-CM | POA: Diagnosis not present

## 2017-01-29 DIAGNOSIS — F1510 Other stimulant abuse, uncomplicated: Secondary | ICD-10-CM | POA: Insufficient documentation

## 2017-01-29 DIAGNOSIS — F17210 Nicotine dependence, cigarettes, uncomplicated: Secondary | ICD-10-CM | POA: Diagnosis not present

## 2017-01-29 LAB — CBC WITH DIFFERENTIAL/PLATELET
BASOS ABS: 0 10*3/uL (ref 0.0–0.1)
BASOS PCT: 0 %
EOS ABS: 0.2 10*3/uL (ref 0.0–0.7)
Eosinophils Relative: 2 %
HCT: 38 % — ABNORMAL LOW (ref 39.0–52.0)
HEMOGLOBIN: 12.7 g/dL — AB (ref 13.0–17.0)
LYMPHS ABS: 2.5 10*3/uL (ref 0.7–4.0)
Lymphocytes Relative: 35 %
MCH: 30.2 pg (ref 26.0–34.0)
MCHC: 33.4 g/dL (ref 30.0–36.0)
MCV: 90.5 fL (ref 78.0–100.0)
Monocytes Absolute: 0.3 10*3/uL (ref 0.1–1.0)
Monocytes Relative: 4 %
NEUTROS PCT: 59 %
Neutro Abs: 4.3 10*3/uL (ref 1.7–7.7)
PLATELETS: 234 10*3/uL (ref 150–400)
RBC: 4.2 MIL/uL — AB (ref 4.22–5.81)
RDW: 13.1 % (ref 11.5–15.5)
WBC: 7.3 10*3/uL (ref 4.0–10.5)

## 2017-01-29 LAB — RAPID URINE DRUG SCREEN, HOSP PERFORMED
AMPHETAMINES: POSITIVE — AB
BARBITURATES: NOT DETECTED
Benzodiazepines: NOT DETECTED
Cocaine: NOT DETECTED
Opiates: POSITIVE — AB
TETRAHYDROCANNABINOL: POSITIVE — AB

## 2017-01-29 LAB — COMPREHENSIVE METABOLIC PANEL
ALT: 22 U/L (ref 17–63)
AST: 19 U/L (ref 15–41)
Albumin: 3.8 g/dL (ref 3.5–5.0)
Alkaline Phosphatase: 61 U/L (ref 38–126)
Anion gap: 6 (ref 5–15)
BUN: 14 mg/dL (ref 6–20)
CHLORIDE: 108 mmol/L (ref 101–111)
CO2: 26 mmol/L (ref 22–32)
CREATININE: 0.86 mg/dL (ref 0.61–1.24)
Calcium: 8.9 mg/dL (ref 8.9–10.3)
GFR calc Af Amer: >60 mL/min (ref >60)
GLUCOSE: 104 mg/dL — AB (ref 65–99)
Potassium: 4 mmol/L (ref 3.5–5.1)
Sodium: 140 mmol/L (ref 135–145)
Total Bilirubin: 0.5 mg/dL (ref 0.3–1.2)
Total Protein: 6.9 g/dL (ref 6.5–8.1)

## 2017-01-29 LAB — ETHANOL

## 2017-01-29 MED ORDER — ARIPIPRAZOLE 2 MG PO TABS
2.0000 mg | ORAL_TABLET | Freq: Every day | ORAL | Status: DC
  Administered 2017-01-29 – 2017-01-30 (×2): 2 mg via ORAL
  Filled 2017-01-29 (×2): qty 1

## 2017-01-29 MED ORDER — GABAPENTIN 400 MG PO CAPS
400.0000 mg | ORAL_CAPSULE | Freq: Three times a day (TID) | ORAL | Status: DC
  Administered 2017-01-29 – 2017-01-30 (×2): 400 mg via ORAL
  Filled 2017-01-29 (×2): qty 1

## 2017-01-29 MED ORDER — IBUPROFEN 200 MG PO TABS: 600.0000 mg | ORAL_TABLET | Freq: Three times a day (TID) | ORAL | Status: DC | PRN

## 2017-01-29 MED ORDER — ZOLPIDEM TARTRATE 5 MG PO TABS: 5.0000 mg | ORAL_TABLET | Freq: Every evening | ORAL | Status: DC | PRN

## 2017-01-29 MED ORDER — NICOTINE 21 MG/24HR TD PT24
21.0000 mg | MEDICATED_PATCH | Freq: Every day | TRANSDERMAL | Status: DC
Start: 2017-01-29 — End: 2017-01-30
  Administered 2017-01-29 – 2017-01-30 (×2): 21 mg via TRANSDERMAL
  Filled 2017-01-29 (×2): qty 1

## 2017-01-29 MED ORDER — CITALOPRAM HYDROBROMIDE 10 MG PO TABS
20.0000 mg | ORAL_TABLET | Freq: Every day | ORAL | Status: DC
  Administered 2017-01-29 – 2017-01-30 (×2): 20 mg via ORAL
  Filled 2017-01-29 (×2): qty 2

## 2017-01-29 MED ORDER — ONDANSETRON HCL 4 MG PO TABS: 4.0000 mg | ORAL_TABLET | Freq: Three times a day (TID) | ORAL | Status: DC | PRN

## 2017-01-29 MED ORDER — TRAZODONE HCL 50 MG PO TABS: 50.0000 mg | ORAL_TABLET | Freq: Every evening | ORAL | Status: DC | PRN

## 2017-01-29 NOTE — ED Notes (Signed)
Gave report to Raschel, RN. Patient will be wanded by security and escorted to room 42. [facility]
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
## 2017-01-29 NOTE — ED Notes (Signed)
Patient educated about search process and term "contraband " and routine search performed. No contraband found. 

## 2017-01-29 NOTE — ED Notes (Signed)
Pt has been wanded, belongings have been wanded and are placed in tcu locker 29

## 2017-01-29 NOTE — ED Triage Notes (Signed)
Per pt, states he has been using heroin and meth on and off for 20 years-states he has been active in a recovery program before-[facility] but states he might not return-states multiple stressors-has been having thoughts of suicide but no plan other than dying from drugs

## 2017-01-29 NOTE — ED Notes (Signed)
Patient reports SI and denies HI and AVH. Plan of care discussed with patient. Encouragement and support provided and safety maintained. Encouragement and support provided and safety maintain. Q 15 min safety checks in place and video monitoring.

## 2017-01-29 NOTE — ED Provider Notes (Signed)
[facility]-EMERGENCY DEPT Provider Note   CSN: [id number] Arrival date & time: 01/29/17  1818     History   Chief Complaint Chief Complaint  Patient presents with  . Suicidal    HPI Marcus [name] is a 41 y.o. male.  HPI Pt comes in with cc of suicidal thoughts and rehab request. Pt has hx of polysubstance abuse, iv heroine / meth. Pt reports that he had to leave the home he was living in because the person living their found out about his addiction. He has been having suicidal thoughts lately, and now wants to seriously stop. He last was in rehab in Oct and stayed sober for 3 months. Pt denies nausea, emesis, fevers, chills, chest pains, shortness of breath, headaches, abdominal pain, uti like symptoms.   Past Medical History:  Diagnosis Date  . Hepatitis C     Patient Active Problem List   Diagnosis Date Noted  . Polysubstance dependence including opioid type drug without complication, episodic abuse (HCC) 07/02/2016    History reviewed. No pertinent surgical history.     Home Medications    Prior to Admission medications   Medication Sig Start Date End Date Taking? Authorizing Provider  ARIPiprazole (ABILIFY) 2 MG tablet Take 1 tablet (2 mg total) by mouth daily. 07/09/16   Adonis BrookAgustin, Sheila, NP  citalopram (CELEXA) 20 MG tablet Take 1 tablet (20 mg total) by mouth daily. 07/09/16   Adonis BrookAgustin, Sheila, NP  gabapentin (NEURONTIN) 400 MG capsule Take 1 capsule (400 mg total) by mouth 3 (three) times daily. 07/08/16   Adonis BrookAgustin, Sheila, NP  nicotine (NICODERM CQ - DOSED IN MG/24 HOURS) 21 mg/24hr patch Place 1 patch (21 mg total) onto the skin daily. 07/09/16   Adonis BrookAgustin, Sheila, NP  traZODone (DESYREL) 50 MG tablet Take 1 tablet (50 mg total) by mouth at bedtime as needed for sleep. 07/08/16   Adonis BrookAgustin, Sheila, NP    Family History No family history on file.  Social History Social History  Substance Use Topics  . Smoking status: Current Every Day Smoker  . Smokeless tobacco: Never  Used  . Alcohol use No     Allergies   Bee venom; Grapefruit oil; Naproxen; and Tylenol [acetaminophen]   Review of Systems Review of Systems  Constitutional: Negative for activity change.  Respiratory: Negative for cough and shortness of breath.   Cardiovascular: Negative for chest pain.  Gastrointestinal: Negative for abdominal pain.  Genitourinary: Negative for dysuria.  Psychiatric/Behavioral: Positive for suicidal ideas. Negative for self-injury.     Physical Exam Updated Vital Signs BP 107/67 (BP Location: Right Arm)   Pulse 80   Temp 98.7 F (37.1 C) (Oral)   Resp 20   Ht 5\' 11"  (1.803 m)   Wt 71.6 kg (157 lb 14.4 oz)   SpO2 98%   BMI 22.02 kg/m   Physical Exam  Constitutional: He is oriented to person, place, and time. He appears well-developed.  HENT:  Head: Atraumatic.  Neck: Neck supple.  Cardiovascular: Normal rate.   Pulmonary/Chest: Effort normal.  Abdominal: Soft.  Neurological: He is alert and oriented to person, place, and time.  Skin: Skin is warm.  Psychiatric: He has a normal mood and affect. His behavior is normal.  Nursing note and vitals reviewed.    ED Treatments / Results  Labs (all labs ordered are listed, but only abnormal results are displayed) Labs Reviewed  COMPREHENSIVE METABOLIC PANEL - Abnormal; Notable for the following:       Result Value  Glucose, Bld 104 (*)    All other components within normal limits  RAPID URINE DRUG SCREEN, HOSP PERFORMED - Abnormal; Notable for the following:    Opiates POSITIVE (*)    Amphetamines POSITIVE (*)    Tetrahydrocannabinol POSITIVE (*)    All other components within normal limits  CBC WITH DIFFERENTIAL/PLATELET - Abnormal; Notable for the following:    RBC 4.20 (*)    Hemoglobin 12.7 (*)    HCT 38.0 (*)    All other components within normal limits  ETHANOL    EKG  EKG Interpretation None       Radiology No results found.  Procedures Procedures (including critical  care time)  Medications Ordered in ED Medications  ondansetron (ZOFRAN) tablet 4 mg (not administered)  ibuprofen (ADVIL,MOTRIN) tablet 600 mg (not administered)  zolpidem (AMBIEN) tablet 5 mg (not administered)  ARIPiprazole (ABILIFY) tablet 2 mg (not administered)  citalopram (CELEXA) tablet 20 mg (not administered)  gabapentin (NEURONTIN) capsule 400 mg (not administered)  nicotine (NICODERM CQ - dosed in mg/24 hours) patch 21 mg (not administered)  traZODone (DESYREL) tablet 50 mg (not administered)     Initial Impression / Assessment and Plan / ED Course  I have reviewed the triage vital signs and the nursing notes.  Pertinent labs & imaging results that were available during my care of the patient were reviewed by me and considered in my medical decision making (see chart for details).     Pt comes in with suicidal thoughts and seeking rehab from polysubstance abuse.  SI - no plans, just thoughts. Rehab: Pt has polysubstance abuse. He is not a frequent  Utilizer, and I think he might benefit from possible inpatient rehab if he qualifies.  TTS consulted. If TTS deems that patient is stable for d/c, I would be fine with that plan. Pt has no serious SI thoughts, or plans.  Final Clinical Impressions(s) / ED Diagnoses   Final diagnoses:  Suicidal ideation  Polysubstance abuse    New Prescriptions New Prescriptions   No medications on file     Derwood Kaplan, MD 01/29/17 2049

## 2017-01-30 DIAGNOSIS — F1190 Opioid use, unspecified, uncomplicated: Secondary | ICD-10-CM | POA: Diagnosis not present

## 2017-01-30 DIAGNOSIS — G4700 Insomnia, unspecified: Secondary | ICD-10-CM | POA: Diagnosis not present

## 2017-01-30 DIAGNOSIS — F1590 Other stimulant use, unspecified, uncomplicated: Secondary | ICD-10-CM | POA: Diagnosis not present

## 2017-01-30 DIAGNOSIS — F17210 Nicotine dependence, cigarettes, uncomplicated: Secondary | ICD-10-CM | POA: Diagnosis not present

## 2017-01-30 DIAGNOSIS — F1920 Other psychoactive substance dependence, uncomplicated: Secondary | ICD-10-CM | POA: Diagnosis not present

## 2017-01-30 NOTE — Discharge Instructions (Signed)
To help you maintain a sober lifestyle, a substance abuse treatment program may be beneficial to you.  Contact one of the following facilities at your earliest opportunity to ask about enrolling: ° °RESIDENTIAL PROGRAMS: ° °     [facility] °     [address] °     [place], [place] [address] °     [phone] ° °     [facility] °     [address] °     [place], [place] [address] °     [phone] ° °     [facility] °     [address] °     [place], [place] [address] °     [phone] ° °OUTPATIENT PROGRAMS: ° °     [facility] °     [address] °     [place], [place] [address] °     [phone] °     New patients are seen at the walk-in clinic every Tuesday from 9:00 am - 12:00 pm. °

## 2017-01-30 NOTE — [facility] Suicide Risk Assessment (Signed)
Suicide Risk Assessment  Discharge Assessment   [facility]   Principal Problem: Polysubstance dependence including opioid type drug without complication, episodic abuse [facility](HCC) Discharge Diagnoses:  Patient Active Problem List   Diagnosis Date Noted  . Polysubstance dependence including opioid type drug without complication, episodic abuse [facility](HCC) [F19.20] 07/02/2016    Priority: High    Total Time spent with patient: 45 minutes   Musculoskeletal: Strength & Muscle Tone: within normal limits Gait & Station: normal Patient leans: N/A  Psychiatric Specialty Exam: Physical Exam  Constitutional: He is oriented to person, place, and time. He appears well-developed and well-nourished.  HENT:  Head: Normocephalic.  Neck: Normal range of motion.  Respiratory: Effort normal.  Musculoskeletal: Normal range of motion.  Neurological: He is alert and oriented to person, place, and time.  Psychiatric: He has a normal mood and affect. His speech is normal and behavior is normal. Judgment and thought content normal. Cognition and memory are normal.    Review of Systems  Psychiatric/Behavioral: Positive for substance abuse.  All other systems reviewed and are negative.   Blood pressure 120/72, pulse (!) 59, temperature 97.7 F (36.5 C), temperature source Oral, resp. rate 16, height 5\' 11"  (1.803 m), weight 71.6 kg (157 lb 14.4 oz), SpO2 100 %.Body mass index is 22.02 kg/m.  General Appearance: Casual  Eye Contact:  Good  Speech:  Normal Rate  Volume:  Normal  Mood:  Euthymic  Affect:  Congruent  Thought Process:  Coherent and Descriptions of Associations: Intact  Orientation:  Full (Time, Place, and Person)  Thought Content:  WDL and Logical  Suicidal Thoughts:  No  Homicidal Thoughts:  No  Memory:  Immediate;   Good Recent;   Good Remote;   Good  Judgement:  Fair  Insight:  Fair  Psychomotor Activity:  Normal  Concentration:  Concentration: Good and  Attention Span: Good  Recall:  Good  Fund of Knowledge:  Fair  Language:  Good  Akathisia:  No  Handed:  Right  AIMS (if indicated):     Assets:  Leisure Time Physical Health Resilience  ADL's:  Intact  Cognition:  WNL  Sleep:       Mental Status Per Nursing Assessment::   On Admission:   substance abuse with suicidal ideations  Demographic Factors:  Male and Caucasian  Loss Factors: NA  Historical Factors: NA  Risk Reduction Factors:   Sense of responsibility to family and Positive social support  Continued Clinical Symptoms:  None  Cognitive Features That Contribute To Risk:  None    Suicide Risk:  Minimal: No identifiable suicidal ideation.  Patients presenting with no risk factors but with morbid ruminations; may be classified as minimal risk based on the severity of the depressive symptoms    Plan Of Care/Follow-up recommendations:  Activity:  as tolerated Diet:  heart healthy diet  Calirose Mccance, NP 01/30/2017, 11:53 AM

## 2017-01-30 NOTE — Consult Note (Signed)
[facility] Face-to-Face Psychiatry Consult   Reason for Consult:  Substance abuse with suicidal ideations Referring Physician:  EDP Patient Identification: Rudie Sermons MRN:  [id number] Principal Diagnosis: Polysubstance dependence including opioid type drug without complication, episodic abuse [facility]) Diagnosis:   Patient Active Problem List   Diagnosis Date Noted  . Polysubstance dependence including opioid type drug without complication, episodic abuse [facility]) [F19.20] 07/02/2016    Priority: High    Total Time spent with patient: 45 minutes  Subjective:   Marcus [name] is a 41 y.o. male patient does not warrant admission.  HPI:  41 yo male who presented to the ED wanting rehab with suicidal ideations initially.  Today on assessment, he denies suicidal ideations or homicidal ideations, hallucinations, or withdrawal symptoms.  He is interested in rehab as he was clean for 3 months after his last detox when he went to [facility] and then an [employer], resources provided.  Stable for discharge.  Past Psychiatric History: substance abuse, depression  Risk to Self:NOne Risk to Others: NOne Prior Inpatient Therapy: Prior Inpatient Therapy: Yes Prior Therapy Dates: 2017 Prior Therapy Facilty/Provider(s): [facility] and [facility] Reason for Treatment: SI and substance use. Prior Outpatient Therapy: Prior Outpatient Therapy: No Prior Therapy Dates: NA Prior Therapy Facilty/Provider(s): NA Reason for Treatment: NA Does patient have an ACCT team?: No Does patient have Intensive In-House Services?  : No Does patient have [facility] services? : No Does patient have P4CC services?: No  Past Medical History:  Past Medical History:  Diagnosis Date  . Hepatitis C    History reviewed. No pertinent surgical history. Family History: No family history on file. Family Psychiatric  History: unknown2 Social History:  History  Alcohol Use No     History  Drug Use  . Frequency: 7.0 times per week  . Types:  Methamphetamines, Heroin    Comment: Daily use where possible    Social History   Social History  . Marital status: Married    Spouse name: N/A  . Number of children: N/A  . Years of education: N/A   Social History Main Topics  . Smoking status: Current Every Day Smoker  . Smokeless tobacco: Never Used  . Alcohol use No  . Drug use: Yes    Frequency: 7.0 times per week    Types: Methamphetamines, Heroin     Comment: Daily use where possible  . Sexual activity: Not Currently   Other Topics Concern  . None   Social History Narrative  . None   Additional Social History:    Allergies:   Allergies  Allergen Reactions  . Bee Venom Anaphylaxis  . Food Hives and Other (See Comments)    Pt is allergic to grapefruit.  . Naproxen Nausea And Vomiting  . Tylenol [Acetaminophen] Other (See Comments)    Pt states that he is not supposed to take this medication because of his Hep C.     Labs:  Results for orders placed or performed during the hospital encounter of 01/29/17 (from the past 48 hour(s))  Urine rapid drug screen (hosp performed)     Status: Abnormal   Collection Time: 01/29/17  6:42 PM  Result Value Ref Range   Opiates POSITIVE (A) NONE DETECTED   Cocaine NONE DETECTED NONE DETECTED   Benzodiazepines NONE DETECTED NONE DETECTED   Amphetamines POSITIVE (A) NONE DETECTED   Tetrahydrocannabinol POSITIVE (A) NONE DETECTED   Barbiturates NONE DETECTED NONE DETECTED    Comment:  DRUG SCREEN FOR MEDICAL PURPOSES ONLY.  IF CONFIRMATION IS NEEDED FOR ANY PURPOSE, NOTIFY LAB WITHIN 5 DAYS.        LOWEST DETECTABLE LIMITS FOR URINE DRUG SCREEN Drug Class       Cutoff (ng/mL) Amphetamine      1000 Barbiturate      200 Benzodiazepine   676 Tricyclics       720 Opiates          300 Cocaine          300 THC              50   Comprehensive metabolic panel     Status: Abnormal   Collection Time: 01/29/17  7:03 PM  Result Value Ref Range   Sodium 140 135 - 145  mmol/L   Potassium 4.0 3.5 - 5.1 mmol/L   Chloride 108 101 - 111 mmol/L   CO2 26 22 - 32 mmol/L   Glucose, Bld 104 (H) 65 - 99 mg/dL   BUN 14 6 - 20 mg/dL   Creatinine, Ser 0.86 0.61 - 1.24 mg/dL   Calcium 8.9 8.9 - 10.3 mg/dL   Total Protein 6.9 6.5 - 8.1 g/dL   Albumin 3.8 3.5 - 5.0 g/dL   AST 19 15 - 41 U/L   ALT 22 17 - 63 U/L   Alkaline Phosphatase 61 38 - 126 U/L   Total Bilirubin 0.5 0.3 - 1.2 mg/dL   GFR calc non Af Amer >60 >60 mL/min   GFR calc Af Amer >60 >60 mL/min    Comment: (NOTE) The eGFR has been calculated using the CKD EPI equation. This calculation has not been validated in all clinical situations. eGFR's persistently <60 mL/min signify possible Chronic Kidney Disease.    Anion gap 6 5 - 15  Ethanol     Status: None   Collection Time: 01/29/17  7:03 PM  Result Value Ref Range   Alcohol, Ethyl (B) <5 <5 mg/dL    Comment:        LOWEST DETECTABLE LIMIT FOR SERUM ALCOHOL IS 5 mg/dL FOR MEDICAL PURPOSES ONLY   CBC with Diff     Status: Abnormal   Collection Time: 01/29/17  7:03 PM  Result Value Ref Range   WBC 7.3 4.0 - 10.5 K/uL   RBC 4.20 (L) 4.22 - 5.81 MIL/uL   Hemoglobin 12.7 (L) 13.0 - 17.0 g/dL   HCT 38.0 (L) 39.0 - 52.0 %   MCV 90.5 78.0 - 100.0 fL   MCH 30.2 26.0 - 34.0 pg   MCHC 33.4 30.0 - 36.0 g/dL   RDW 13.1 11.5 - 15.5 %   Platelets 234 150 - 400 K/uL   Neutrophils Relative % 59 %   Neutro Abs 4.3 1.7 - 7.7 K/uL   Lymphocytes Relative 35 %   Lymphs Abs 2.5 0.7 - 4.0 K/uL   Monocytes Relative 4 %   Monocytes Absolute 0.3 0.1 - 1.0 K/uL   Eosinophils Relative 2 %   Eosinophils Absolute 0.2 0.0 - 0.7 K/uL   Basophils Relative 0 %   Basophils Absolute 0.0 0.0 - 0.1 K/uL    Current Facility-Administered Medications  Medication Dose Route Frequency Provider Last Rate Last Dose  . ARIPiprazole (ABILIFY) tablet 2 mg  2 mg Oral Daily Varney Biles, MD   2 mg at 01/30/17 0928  . citalopram (CELEXA) tablet 20 mg  20 mg Oral Daily  Varney Biles, MD   20 mg at 01/30/17 0928  .  gabapentin (NEURONTIN) capsule 400 mg  400 mg Oral TID Varney Biles, MD   400 mg at 01/30/17 0928  . ibuprofen (ADVIL,MOTRIN) tablet 600 mg  600 mg Oral Q8H PRN Nanavati, Ankit, MD      . nicotine (NICODERM CQ - dosed in mg/24 hours) patch 21 mg  21 mg Transdermal Daily Kathrynn Humble, Ankit, MD   21 mg at 01/30/17 0931  . ondansetron (ZOFRAN) tablet 4 mg  4 mg Oral Q8H PRN Nanavati, Ankit, MD      . traZODone (DESYREL) tablet 50 mg  50 mg Oral QHS PRN Varney Biles, MD       Current Outpatient Prescriptions  Medication Sig Dispense Refill  . ibuprofen (ADVIL,MOTRIN) 200 MG tablet Take 200-400 mg by mouth every 6 (six) hours as needed for headache, mild pain or moderate pain.      Musculoskeletal: Strength & Muscle Tone: within normal limits Gait & Station: normal Patient leans: N/A  Psychiatric Specialty Exam: Physical Exam  Constitutional: He is oriented to person, place, and time. He appears well-developed and well-nourished.  HENT:  Head: Normocephalic.  Neck: Normal range of motion.  Respiratory: Effort normal.  Musculoskeletal: Normal range of motion.  Neurological: He is alert and oriented to person, place, and time.  Psychiatric: He has a normal mood and affect. His speech is normal and behavior is normal. Judgment and thought content normal. Cognition and memory are normal.    Review of Systems  Psychiatric/Behavioral: Positive for substance abuse.  All other systems reviewed and are negative.   Blood pressure 120/72, pulse (!) 59, temperature 97.7 F (36.5 C), temperature source Oral, resp. rate 16, height 5' 11"  (1.803 m), weight 71.6 kg (157 lb 14.4 oz), SpO2 100 %.Body mass index is 22.02 kg/m.  General Appearance: Casual  Eye Contact:  Good  Speech:  Normal Rate  Volume:  Normal  Mood:  Euthymic  Affect:  Congruent  Thought Process:  Coherent and Descriptions of Associations: Intact  Orientation:  Full (Time,  Place, and Person)  Thought Content:  WDL and Logical  Suicidal Thoughts:  No  Homicidal Thoughts:  No  Memory:  Immediate;   Good Recent;   Good Remote;   Good  Judgement:  Fair  Insight:  Fair  Psychomotor Activity:  Normal  Concentration:  Concentration: Good and Attention Span: Good  Recall:  Good  Fund of Knowledge:  Fair  Language:  Good  Akathisia:  No  Handed:  Right  AIMS (if indicated):     Assets:  Leisure Time Physical Health Resilience  ADL's:  Intact  Cognition:  WNL  Sleep:        Treatment Plan Summary: Daily contact with patient to assess and evaluate symptoms and progress in treatment, Medication management and Plan Polysubstance dependence including opioid type drug without complication, episodic abuse ([facility])  -Crisis stabilization -Medication management: Restarted Abilify 2 mg daily for mood stabilization along with Celexa 20 mg daily for depression, Gabapentin 400 mg TID for withdrawal symptoms, and Trazodone 50 mg at bedtime PRN sleep. -Individual and substance abuse counseling  Disposition: No evidence of imminent risk to self or others at present.    Waylan Boga, NP 01/30/2017 11:50 AM  Patient seen face-to-face for psychiatric evaluation, chart reviewed and case discussed with the physician extender and developed treatment plan. Reviewed the information documented and agree with the treatment plan. Corena Pilgrim, MD

## 2017-01-30 NOTE — BH Assessment (Signed)
[facility] Assessment Progress Note  Per Thedore MinsMojeed Akintayo, MD, this pt does not require psychiatric hospitalization at this time.  [facility] information for area substance abuse treatment providers.  This has been included in pt's discharge instructions.  Pt's nurse, Lincoln MaxinOlivette, has been notified.  Doylene Canninghomas Kiara Mcdowell, MA Triage Specialist [phone]

## 2017-06-28 ENCOUNTER — Emergency Department (HOSPITAL_COMMUNITY)
Admission: EM | Admit: 2017-06-28 | Discharge: 2017-06-29 | Disposition: A | Discharge status: Home / Self Care | Payer: Self-pay | Attending: Emergency Medicine | Admitting: Emergency Medicine

## 2017-06-28 ENCOUNTER — Encounter (HOSPITAL_COMMUNITY): Payer: Self-pay | Admitting: Emergency Medicine

## 2017-06-28 DIAGNOSIS — F1920 Other psychoactive substance dependence, uncomplicated: Secondary | ICD-10-CM | POA: Insufficient documentation

## 2017-06-28 DIAGNOSIS — R45851 Suicidal ideations: Secondary | ICD-10-CM | POA: Insufficient documentation

## 2017-06-28 DIAGNOSIS — F332 Major depressive disorder, recurrent severe without psychotic features: Secondary | ICD-10-CM | POA: Insufficient documentation

## 2017-06-28 DIAGNOSIS — F17200 Nicotine dependence, unspecified, uncomplicated: Secondary | ICD-10-CM | POA: Insufficient documentation

## 2017-06-28 LAB — COMPREHENSIVE METABOLIC PANEL
ALBUMIN: 3.9 g/dL (ref 3.5–5.0)
ALK PHOS: 73 U/L (ref 38–126)
ALT: 64 U/L — ABNORMAL HIGH (ref 17–63)
ANION GAP: 10 (ref 5–15)
AST: 41 U/L (ref 15–41)
BILIRUBIN TOTAL: 0.5 mg/dL (ref 0.3–1.2)
BUN: 16 mg/dL (ref 6–20)
CALCIUM: 9.6 mg/dL (ref 8.9–10.3)
CO2: 27 mmol/L (ref 22–32)
Chloride: 102 mmol/L (ref 101–111)
Creatinine, Ser: 0.8 mg/dL (ref 0.61–1.24)
Glucose, Bld: 107 mg/dL — ABNORMAL HIGH (ref 65–99)
POTASSIUM: 3.5 mmol/L (ref 3.5–5.1)
Sodium: 139 mmol/L (ref 135–145)
TOTAL PROTEIN: 8 g/dL (ref 6.5–8.1)

## 2017-06-28 LAB — SALICYLATE LEVEL

## 2017-06-28 LAB — RAPID URINE DRUG SCREEN, HOSP PERFORMED
Amphetamines: NOT DETECTED
Barbiturates: NOT DETECTED
Benzodiazepines: NOT DETECTED
COCAINE: NOT DETECTED
OPIATES: NOT DETECTED
Tetrahydrocannabinol: POSITIVE — AB

## 2017-06-28 LAB — CBC
HCT: 42.5 % (ref 39.0–52.0)
Hemoglobin: 14.2 g/dL (ref 13.0–17.0)
MCH: 31 pg (ref 26.0–34.0)
MCHC: 33.4 g/dL (ref 30.0–36.0)
MCV: 92.8 fL (ref 78.0–100.0)
Platelets: 345 10*3/uL (ref 150–400)
RBC: 4.58 MIL/uL (ref 4.22–5.81)
RDW: 12.8 % (ref 11.5–15.5)
WBC: 13.1 10*3/uL — AB (ref 4.0–10.5)

## 2017-06-28 LAB — ACETAMINOPHEN LEVEL

## 2017-06-28 LAB — ETHANOL

## 2017-06-28 NOTE — ED Notes (Signed)
Staffing notified about sitter need. Scrubs given for pt to change, pt oriented that he will be wand by security.

## 2017-06-28 NOTE — ED Triage Notes (Signed)
Pt states he been having SI with a plan of jumping in front of a car, he states that he is been working a lot and find out today that he will not get pay and this stress him out.

## 2017-06-28 NOTE — ED Notes (Signed)
Pt on BH scrub and wanded by security.  No sitter available at this time.

## 2017-06-28 NOTE — ED Notes (Signed)
Pt given a Malaw[place]urkey sandwich, apple sauce, short bread cookies, lemon cookies and caff free coke for snack

## 2017-06-29 ENCOUNTER — Emergency Department (HOSPITAL_COMMUNITY): Payer: Self-pay

## 2017-06-29 ENCOUNTER — Inpatient Hospital Stay (HOSPITAL_COMMUNITY)
Admission: AD | Admit: 2017-06-29 | Discharge: 2017-07-15 | DRG: 885 | Disposition: A | Discharge status: Home / Self Care | Payer: Medicaid Other | Source: Intra-hospital | Attending: Psychiatry | Admitting: Psychiatry

## 2017-06-29 ENCOUNTER — Encounter (HOSPITAL_COMMUNITY): Payer: Self-pay | Admitting: *Deleted

## 2017-06-29 DIAGNOSIS — G8929 Other chronic pain: Secondary | ICD-10-CM | POA: Diagnosis present

## 2017-06-29 DIAGNOSIS — Z91018 Allergy to other foods: Secondary | ICD-10-CM | POA: Diagnosis not present

## 2017-06-29 DIAGNOSIS — R45851 Suicidal ideations: Secondary | ICD-10-CM | POA: Diagnosis present

## 2017-06-29 DIAGNOSIS — R197 Diarrhea, unspecified: Secondary | ICD-10-CM | POA: Diagnosis not present

## 2017-06-29 DIAGNOSIS — R112 Nausea with vomiting, unspecified: Secondary | ICD-10-CM | POA: Diagnosis not present

## 2017-06-29 DIAGNOSIS — R443 Hallucinations, unspecified: Secondary | ICD-10-CM | POA: Diagnosis not present

## 2017-06-29 DIAGNOSIS — Z598 Other problems related to housing and economic circumstances: Secondary | ICD-10-CM | POA: Diagnosis not present

## 2017-06-29 DIAGNOSIS — F17210 Nicotine dependence, cigarettes, uncomplicated: Secondary | ICD-10-CM | POA: Diagnosis present

## 2017-06-29 DIAGNOSIS — Z880 Allergy status to penicillin: Secondary | ICD-10-CM

## 2017-06-29 DIAGNOSIS — R45850 Homicidal ideations: Secondary | ICD-10-CM | POA: Diagnosis present

## 2017-06-29 DIAGNOSIS — Z814 Family history of other substance abuse and dependence: Secondary | ICD-10-CM | POA: Diagnosis not present

## 2017-06-29 DIAGNOSIS — Z560 Unemployment, unspecified: Secondary | ICD-10-CM

## 2017-06-29 DIAGNOSIS — F1120 Opioid dependence, uncomplicated: Secondary | ICD-10-CM | POA: Diagnosis not present

## 2017-06-29 DIAGNOSIS — Z91030 Bee allergy status: Secondary | ICD-10-CM | POA: Diagnosis not present

## 2017-06-29 DIAGNOSIS — F1210 Cannabis abuse, uncomplicated: Secondary | ICD-10-CM | POA: Diagnosis present

## 2017-06-29 DIAGNOSIS — Z8619 Personal history of other infectious and parasitic diseases: Secondary | ICD-10-CM | POA: Diagnosis not present

## 2017-06-29 DIAGNOSIS — F1920 Other psychoactive substance dependence, uncomplicated: Secondary | ICD-10-CM | POA: Diagnosis not present

## 2017-06-29 DIAGNOSIS — R4582 Worries: Secondary | ICD-10-CM | POA: Diagnosis not present

## 2017-06-29 DIAGNOSIS — Z818 Family history of other mental and behavioral disorders: Secondary | ICD-10-CM | POA: Diagnosis not present

## 2017-06-29 DIAGNOSIS — R451 Restlessness and agitation: Secondary | ICD-10-CM | POA: Diagnosis not present

## 2017-06-29 DIAGNOSIS — F332 Major depressive disorder, recurrent severe without psychotic features: Secondary | ICD-10-CM | POA: Diagnosis not present

## 2017-06-29 DIAGNOSIS — G4700 Insomnia, unspecified: Secondary | ICD-10-CM | POA: Diagnosis present

## 2017-06-29 DIAGNOSIS — R440 Auditory hallucinations: Secondary | ICD-10-CM | POA: Diagnosis not present

## 2017-06-29 DIAGNOSIS — Z886 Allergy status to analgesic agent status: Secondary | ICD-10-CM | POA: Diagnosis not present

## 2017-06-29 DIAGNOSIS — F419 Anxiety disorder, unspecified: Secondary | ICD-10-CM | POA: Diagnosis present

## 2017-06-29 DIAGNOSIS — F4010 Social phobia, unspecified: Secondary | ICD-10-CM | POA: Diagnosis present

## 2017-06-29 DIAGNOSIS — Z62811 Personal history of psychological abuse in childhood: Secondary | ICD-10-CM | POA: Diagnosis not present

## 2017-06-29 DIAGNOSIS — Z23 Encounter for immunization: Secondary | ICD-10-CM | POA: Diagnosis not present

## 2017-06-29 DIAGNOSIS — F3162 Bipolar disorder, current episode mixed, moderate: Secondary | ICD-10-CM | POA: Diagnosis present

## 2017-06-29 DIAGNOSIS — Z564 Discord with boss and workmates: Secondary | ICD-10-CM | POA: Diagnosis not present

## 2017-06-29 DIAGNOSIS — F1910 Other psychoactive substance abuse, uncomplicated: Secondary | ICD-10-CM | POA: Diagnosis not present

## 2017-06-29 DIAGNOSIS — F333 Major depressive disorder, recurrent, severe with psychotic symptoms: Secondary | ICD-10-CM | POA: Diagnosis not present

## 2017-06-29 DIAGNOSIS — M545 Low back pain: Secondary | ICD-10-CM | POA: Diagnosis not present

## 2017-06-29 DIAGNOSIS — F1520 Other stimulant dependence, uncomplicated: Secondary | ICD-10-CM | POA: Diagnosis not present

## 2017-06-29 DIAGNOSIS — F4310 Post-traumatic stress disorder, unspecified: Secondary | ICD-10-CM | POA: Diagnosis not present

## 2017-06-29 DIAGNOSIS — R450 Nervousness: Secondary | ICD-10-CM | POA: Diagnosis not present

## 2017-06-29 DIAGNOSIS — Z62810 Personal history of physical and sexual abuse in childhood: Secondary | ICD-10-CM | POA: Diagnosis not present

## 2017-06-29 MED ORDER — ALBUTEROL SULFATE HFA 108 (90 BASE) MCG/ACT IN AERS: 1.0000 | INHALATION_SPRAY | Freq: Four times a day (QID) | RESPIRATORY_TRACT | Status: DC | PRN

## 2017-06-29 MED ORDER — HYDROXYZINE HCL 25 MG PO TABS
25.0000 mg | ORAL_TABLET | Freq: Three times a day (TID) | ORAL | Status: DC | PRN
  Administered 2017-06-30 – 2017-07-14 (×15): 25 mg via ORAL
  Filled 2017-06-29 (×2): qty 1
  Filled 2017-06-29: qty 20
  Filled 2017-06-29 (×13): qty 1

## 2017-06-29 MED ORDER — ADULT MULTIVITAMIN W/MINERALS CH: 1.0000 | ORAL_TABLET | Freq: Every day | ORAL | Status: DC

## 2017-06-29 MED ORDER — ENSURE ENLIVE PO LIQD
237.0000 mL | Freq: Two times a day (BID) | ORAL | Status: DC
  Administered 2017-06-30 – 2017-07-14 (×27): 237 mL via ORAL

## 2017-06-29 MED ORDER — NICOTINE 21 MG/24HR TD PT24
21.0000 mg | MEDICATED_PATCH | Freq: Every day | TRANSDERMAL | Status: DC
  Administered 2017-06-29: 21 mg via TRANSDERMAL
  Filled 2017-06-29: qty 1

## 2017-06-29 MED ORDER — IBUPROFEN 400 MG PO TABS
400.0000 mg | ORAL_TABLET | Freq: Four times a day (QID) | ORAL | Status: DC | PRN
  Administered 2017-06-29: 400 mg via ORAL
  Filled 2017-06-29: qty 1

## 2017-06-29 MED ORDER — ALUM & MAG HYDROXIDE-SIMETH 200-200-20 MG/5ML PO SUSP: 30.0000 mL | Freq: Four times a day (QID) | ORAL | Status: DC | PRN

## 2017-06-29 MED ORDER — MAGNESIUM HYDROXIDE 400 MG/5ML PO SUSP
30.0000 mL | Freq: Every day | ORAL | Status: DC | PRN
Start: 2017-06-29 — End: 2017-07-15

## 2017-06-29 MED ORDER — INFLUENZA VAC SPLIT QUAD 0.5 ML IM SUSY
0.5000 mL | PREFILLED_SYRINGE | INTRAMUSCULAR | Status: AC
  Administered 2017-06-30: 0.5 mL via INTRAMUSCULAR
  Filled 2017-06-29: qty 0.5

## 2017-06-29 MED ORDER — QUETIAPINE FUMARATE 100 MG PO TABS
100.0000 mg | ORAL_TABLET | Freq: Every evening | ORAL | Status: DC | PRN
  Administered 2017-06-29: 100 mg via ORAL
  Filled 2017-06-29 (×7): qty 1

## 2017-06-29 MED ORDER — GABAPENTIN 400 MG PO CAPS
400.0000 mg | ORAL_CAPSULE | Freq: Three times a day (TID) | ORAL | Status: DC
  Administered 2017-06-29 (×2): 400 mg via ORAL
  Filled 2017-06-29 (×2): qty 1

## 2017-06-29 MED ORDER — IBUPROFEN 200 MG PO TABS
600.0000 mg | ORAL_TABLET | Freq: Four times a day (QID) | ORAL | Status: DC | PRN
  Administered 2017-06-29 – 2017-07-08 (×19): 600 mg via ORAL
  Filled 2017-06-29 (×21): qty 3

## 2017-06-29 MED ORDER — QUETIAPINE FUMARATE ER 50 MG PO TB24: 100.0000 mg | ORAL_TABLET | Freq: Every evening | ORAL | Status: DC | PRN

## 2017-06-29 MED ORDER — TRAZODONE HCL 50 MG PO TABS: 50.0000 mg | ORAL_TABLET | Freq: Every evening | ORAL | Status: DC | PRN

## 2017-06-29 MED ORDER — PNEUMOCOCCAL VAC POLYVALENT 25 MCG/0.5ML IJ INJ
0.5000 mL | INJECTION | INTRAMUSCULAR | Status: AC
  Administered 2017-06-30: 0.5 mL via INTRAMUSCULAR

## 2017-06-29 MED ORDER — ALUM & MAG HYDROXIDE-SIMETH 200-200-20 MG/5ML PO SUSP: 30.0000 mL | ORAL | Status: DC | PRN

## 2017-06-29 MED ORDER — NICOTINE POLACRILEX 2 MG MT GUM
2.0000 mg | CHEWING_GUM | OROMUCOSAL | Status: DC | PRN
  Administered 2017-06-29 – 2017-07-14 (×25): 2 mg via ORAL
  Filled 2017-06-29 (×6): qty 1

## 2017-06-29 MED ORDER — GABAPENTIN 400 MG PO CAPS
400.0000 mg | ORAL_CAPSULE | Freq: Two times a day (BID) | ORAL | Status: DC
  Administered 2017-06-29 – 2017-06-30 (×2): 400 mg via ORAL
  Filled 2017-06-29 (×6): qty 1

## 2017-06-29 MED ORDER — ONDANSETRON HCL 4 MG PO TABS: 4.0000 mg | ORAL_TABLET | Freq: Three times a day (TID) | ORAL | Status: DC | PRN

## 2017-06-29 MED ORDER — NICOTINE 21 MG/24HR TD PT24
21.0000 mg | MEDICATED_PATCH | Freq: Every day | TRANSDERMAL | Status: DC
  Filled 2017-06-29: qty 1

## 2017-06-29 NOTE — ED Notes (Signed)
Declined W/C at D/C and was escorted to lobby by RN. 
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
## 2017-06-29 NOTE — Progress Notes (Signed)
Per Berneice Heinrichina Tate , [facility], [facility] ; Accepting provider is Hillery Jacksanika Lewis, NP; Attending provider is Dr. Jama Flavorsobos.  Patient can arrive at 3:00pm. Number for report is [phone].   Celine AhrAudrey McKeown, RN notified.   Baldo DaubJolan Falisa Lamora MSW, LCSWA CSW Disposition [phone]

## 2017-06-29 NOTE — Tx Team (Signed)
Initial Treatment Plan 06/29/2017 5:44 PM Marcus ReidDana Streight ZOX:[id number]    PATIENT STRESSORS: Financial difficulties Health problems Medication change or noncompliance Occupational concerns Substance abuse   PATIENT STRENGTHS: Average or above average intelligence Communication skills General fund of knowledge Motivation for treatment/growth Work skills   PATIENT IDENTIFIED PROBLEMS:     " I need to get my living situation under control. 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  The patient and family have been given the opportunity to ask questions and make suggestions.  Lawrence MarseillesFriedman, Kainalu Heggs Eakes, RN 06/29/2017, 5:44 PM

## 2017-06-29 NOTE — ED Notes (Signed)
Pt belongings placed on locker #2

## 2017-06-29 NOTE — Progress Notes (Signed)
Patient vol admitted [facility]. Patient presents with SI to jump in front of a car. Also reports AVH however is vague with details and does not appear to be responding to internal stimuli. States he was triggered by his boss who was unable/unwilling to pay him for the work he did. 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 Patient recently at [facility] weeks ago for similar presentation. States while laughing, "yeah, I liked it over there. I had 3 or 4 girlfriends." UDS is +THC though patient states he binges on meth and heroin with last use 3 weeks ago. Reports weight loss of a 50 lbs over the last month. Patient's report incongruent with presentation at times. Patient also manipulative telling this Clinical research associatewriter, while asking for nicorette, he had taken off his nicotine patch that was placed this AM in the ED however upon inspection, patch was in place on R arm. Patient attempted to hide patch under shirt sleeve. PMH includes asthma, Hep C, chronic back pain.   Patient's skin assessed and belongings searched and secured. Level 3 obs initiated. Emotional support offered and orientation given to unit. Medicated per orders. Nicorette prn and prn advil given for pain. Fall prevention plan in place as patient reports hx of frequent falls and currently has a fractured R toe with orthopedic shoe in place. Informed patient of expectation to respect boundaries of other patients and to remain focused on treatment.   Patient verbalizes understanding of POC, fall prevention plan and treatment agreement. Denies SI/HI "now that I am here" but verbally contracts for safety while on unit. Observed to be social with peers. "I was here before. I know the routine."
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
## 2017-06-29 NOTE — ED Triage Notes (Signed)
Pt left via [place] 

## 2017-06-29 NOTE — ED Provider Notes (Signed)
[facility] Provider Note   CSN: [id number] Arrival date & time: 06/28/17  2123     History   Chief Complaint Chief Complaint  Patient presents with  . Suicidal    HPI Marcus [name] is a 41 y.o. male with past medical history of hepatitis C, and polysubstance abuse, presenting to the ED voluntarily for suicidal ideation that began acutely today.  Patient states he had some stressors at work and began feeling suicidal and homicidal.  He states that he had thoughts of harming people at work, and thoughts of harming himself by jumping out in front of a car.  He states instead of it escalating he reported to the ED for some help and "balance."  Denies auditory or visual hallucinations.  Denies any ingestions.  Warts using marijuana yesterday.  Denies alcohol or other illicit drug use recently.  States he used to take medications for depression and anxiety, is unable to list them, however states he has not had them in 2 days. Patient reports that he injured his right foot about 3 weeks ago, with negative x-ray done.  Patient states pain has been improving, however consistent and states that he believes it is fractured even though the x-ray was negative.  Patient requesting follow-up x-ray today.  Reports pain is worse with palpation and certain movement okayed at the base of the third and fourth toes. No Other medical complaints today.  The history is provided by the patient.    Past Medical History:  Diagnosis Date  . Hepatitis C     Patient Active Problem List   Diagnosis Date Noted  . Polysubstance dependence including opioid type drug without complication, episodic abuse (HCC) 07/02/2016    History reviewed. No pertinent surgical history.     Home Medications    Prior to Admission medications   Medication Sig Start Date End Date Taking? Authorizing Provider  ibuprofen (ADVIL,MOTRIN) 200 MG tablet Take 200-400 mg by mouth every 6 (six)  hours as needed for headache, mild pain or moderate pain.    [provider]    Family History No family history on file.  Social History Social History  Substance Use Topics  . Smoking status: Current Every Day Smoker  . Smokeless tobacco: Never Used  . Alcohol use No     Allergies   Bee venom; Food; Naproxen; and Tylenol [acetaminophen]   Review of Systems Review of Systems  Musculoskeletal: Positive for arthralgias.  Psychiatric/Behavioral: Positive for dysphoric mood and suicidal ideas. Negative for hallucinations.  All other systems reviewed and are negative.    Physical Exam Updated Vital Signs BP 124/79 (BP Location: Left Arm)   Pulse 85   Temp 97.8 F (36.6 C) (Oral)   Resp 16   Ht 5\' 11"  (1.803 m)   Wt 77.1 kg (170 lb)   SpO2 100%   BMI 23.71 kg/m   Physical Exam  Constitutional: He is oriented to person, place, and time. He appears well-developed and well-nourished. No distress.  HENT:  Head: Normocephalic and atraumatic.  Eyes: Conjunctivae are normal.  Cardiovascular: Normal rate, regular rhythm, normal heart sounds and intact distal pulses.   Pulmonary/Chest: Effort normal and breath sounds normal.  Abdominal: Soft. Bowel sounds are normal.  Musculoskeletal:  Right foot without edema, ecchymosis, or deformity.  Some tenderness at the base of the third and fourth toes.  Normal range of motion.  NV intact.  Neurological: He is alert and oriented to person, place, and  time.  Mental Status:  Alert, oriented, thought content appropriate, able to give a coherent history. Speech fluent without evidence of aphasia. Able to follow 2 step commands without difficulty.  Cranial Nerves:  II:  Peripheral visual fields grossly normal, pupils equal, round, reactive to light III,IV, VI: ptosis not present, extra-ocular motions intact bilaterally  V,VII: smile symmetric, facial light touch sensation equal VIII: hearing grossly normal to voice  X: uvula  elevates symmetrically  XI: bilateral shoulder shrug symmetric and strong XII: midline tongue extension without fassiculations Motor:  Normal tone. 5/5 in upper and lower extremities bilaterally including strong and equal grip strength and dorsiflexion/plantar flexion Sensory: Pinprick and light touch normal in all extremities.  Deep Tendon Reflexes: 2+ and symmetric in the biceps and patella Cerebellar: normal finger-to-nose with bilateral upper extremities Gait: normal gait and balance CV: distal pulses palpable throughout    Skin: Skin is warm.  Psychiatric: He has a normal mood and affect. His behavior is normal. His speech is rapid and/or pressured. He is not actively hallucinating. He expresses homicidal and suicidal ideation. He expresses suicidal plans. He expresses no homicidal plans.  Nursing note and vitals reviewed.    ED Treatments / Results  Labs (all labs ordered are listed, but only abnormal results are displayed) Labs Reviewed  COMPREHENSIVE METABOLIC PANEL - Abnormal; Notable for the following:       Result Value   Glucose, Bld 107 (*)    ALT 64 (*)    All other components within normal limits  ACETAMINOPHEN LEVEL - Abnormal; Notable for the following:    Acetaminophen (Tylenol), Serum <10 (*)    All other components within normal limits  CBC - Abnormal; Notable for the following:    WBC 13.1 (*)    All other components within normal limits  RAPID URINE DRUG SCREEN, HOSP PERFORMED - Abnormal; Notable for the following:    Tetrahydrocannabinol POSITIVE (*)    All other components within normal limits  ETHANOL  SALICYLATE LEVEL    EKG  EKG Interpretation None       Radiology No results found.  Procedures Procedures (including critical care time)  Medications Ordered in ED Medications - No data to display   Initial Impression / Assessment and Plan / ED Course  I have reviewed the triage vital signs and the nursing notes.  Pertinent labs &  imaging results that were available during my care of the patient were reviewed by me and considered in my medical decision making (see chart for details).     Patient presenting with SI with plan to jump out in front of a car or jump off a bridge and HI, recent stressor at work today.  Denies hallucinations.  Reports recent use of marijuana, however denies alcohol or other illicit drugs.  States he has not taken any antidepressants in 2 days, however is unable to state which medications they are.  Patient complaining of right foot pain after injury 3 weeks ago and believes it is broken.  Exam unremarkable, ssuring, will get xray. Xray showing healing 2nd metatarsal neck fracture, nondisplaced. NV intact; will place in post-op boot for comfort.Labs unremarkable.  Vital signs stable.  Patient is not distress, is calm and cooperative.  Patient is medically cleared for psychiatric admission.  TTS consulted.  Final Clinical Impressions(s) / ED Diagnoses   Final diagnoses:  Suicidal ideation    New Prescriptions New Prescriptions   No medications on file     Lorisa Scheid, [place] N,  PA-C 06/29/17 [id number]    Devoria Albe, MD 06/29/17 [phone]

## 2017-06-30 DIAGNOSIS — R450 Nervousness: Secondary | ICD-10-CM

## 2017-06-30 DIAGNOSIS — R451 Restlessness and agitation: Secondary | ICD-10-CM

## 2017-06-30 DIAGNOSIS — F419 Anxiety disorder, unspecified: Secondary | ICD-10-CM

## 2017-06-30 DIAGNOSIS — F4010 Social phobia, unspecified: Secondary | ICD-10-CM

## 2017-06-30 DIAGNOSIS — G4700 Insomnia, unspecified: Secondary | ICD-10-CM

## 2017-06-30 DIAGNOSIS — Z62811 Personal history of psychological abuse in childhood: Secondary | ICD-10-CM

## 2017-06-30 DIAGNOSIS — F332 Major depressive disorder, recurrent severe without psychotic features: Secondary | ICD-10-CM

## 2017-06-30 DIAGNOSIS — R4582 Worries: Secondary | ICD-10-CM

## 2017-06-30 DIAGNOSIS — F1920 Other psychoactive substance dependence, uncomplicated: Secondary | ICD-10-CM

## 2017-06-30 DIAGNOSIS — F4310 Post-traumatic stress disorder, unspecified: Secondary | ICD-10-CM

## 2017-06-30 DIAGNOSIS — F17210 Nicotine dependence, cigarettes, uncomplicated: Secondary | ICD-10-CM

## 2017-06-30 DIAGNOSIS — R45851 Suicidal ideations: Secondary | ICD-10-CM

## 2017-06-30 DIAGNOSIS — R440 Auditory hallucinations: Secondary | ICD-10-CM

## 2017-06-30 DIAGNOSIS — Z62810 Personal history of physical and sexual abuse in childhood: Secondary | ICD-10-CM

## 2017-06-30 MED ORDER — QUETIAPINE FUMARATE 100 MG PO TABS
100.0000 mg | ORAL_TABLET | Freq: Every day | ORAL | Status: DC
  Administered 2017-06-30: 100 mg via ORAL
  Filled 2017-06-30 (×2): qty 1

## 2017-06-30 MED ORDER — QUETIAPINE FUMARATE 50 MG PO TABS
50.0000 mg | ORAL_TABLET | Freq: Two times a day (BID) | ORAL | Status: DC
  Administered 2017-06-30: 50 mg via ORAL
  Filled 2017-06-30 (×4): qty 1

## 2017-06-30 MED ORDER — GABAPENTIN 400 MG PO CAPS
400.0000 mg | ORAL_CAPSULE | Freq: Three times a day (TID) | ORAL | Status: DC
  Administered 2017-06-30 – 2017-07-14 (×43): 400 mg via ORAL
  Filled 2017-06-30 (×10): qty 1
  Filled 2017-06-30: qty 42
  Filled 2017-06-30 (×27): qty 1
  Filled 2017-06-30: qty 42
  Filled 2017-06-30 (×3): qty 1
  Filled 2017-06-30: qty 42
  Filled 2017-06-30 (×10): qty 1

## 2017-06-30 NOTE — BHH Group Notes (Addendum)
LCSW Group Therapy Note   06/30/2017 1:15pm   Type of Therapy and Topic:  Group Therapy:  Overcoming Obstacles   Participation Level: Pt invited. Did not attend.  Jonathon JordanLynn B Rehan Holness, MSW, LCSWA 06/30/2017 2:35 PM
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
## 2017-06-30 NOTE — Progress Notes (Signed)
Pt has been in the dayroom all evening socializing with his peers.  He denies HI/AVH at this time. but still has some passive suicidal thoughts.  He denies having any withdrawal symptoms.  He voices no needs or concerns to Clinical research associatewriter.  We discussed available meds for tonight.  He has been taking Motrin for his chronic back pain.  Pt makes his needs known to staff.  Support and encouragement offered.  Discharge plans are in process.  Safety maintained with q15 minute checks.

## 2017-06-30 NOTE — Progress Notes (Signed)
DAR NOTE: Patient presents with anxious affect and depressed mood. Pt has been observed in the milieu interacting with both peers and staff. Pt stated he is used to taking street drugs, and constantly been asking for good staff. Pt stated " just give the whole pixes and I promise you, you will not be in trouble "  Pt complained of generalised  pain, auditory and visual hallucinations, ans SI with no plan. Reports good sleep, good appetite, hyper and good concentration. Rates depression at 8, hopelessness at 8, and anxiety at 9.  Maintained on routine safety checks.  Medications given as prescribed.  Support and encouragement offered as needed.  Attended group and participated.  States goal for today is "my living situation."  Patient observed socializing with peers in the dayroom, will continue to monitor.

## 2017-06-30 NOTE — Plan of Care (Signed)
Problem: Medication: Goal: Compliance with prescribed medication regimen will improve Outcome: Progressing Pt is compliant with his medication regimen.

## 2017-06-30 NOTE — [facility] Suicide Risk Assessment (Signed)
[facility]   Nursing information obtained from:    Demographic factors:    Current Mental Status:    Loss Factors:    Historical Factors:    Risk Reduction Factors:     Total Time spent with patient: 30 minutes Principal Problem: MDD (major depressive disorder) Diagnosis:   Patient Active Problem List   Diagnosis Date Noted  . MDD (major depressive disorder) [F32.9] 06/29/2017  . Polysubstance dependence including opioid type drug without complication, episodic abuse [facility]) [F19.20] 07/02/2016   Subjective Data:   -Marcus [name] is a 41 y/o M with history of MDD and polysubstance dependence who was admitted with worsening depression, SI with plan to jump in front of traffic, and worsened, use of multiple illicit substances. Pt explains his reasons for presenting to the ED, stating, "I got into a spot where I didn't get paid, and it was me deciding to break something over his head or hurt myself or go to the hospital - so I called 911." Pt shares that he had SI with plan to jump into traffic but he [facility] be [facility]. He denies HI. He denies AH/VH. Pt endorses PTSD symptoms of hypervigilance, hyperarousal, and flashbacks. He would like to get restarted on medications after recent discharge from inpatient hospitalization at [facility] about 2 weeks ago. Pt shares that seroquel has been helpful for him and he is in agreement to increase nighttime dose and attempt trial of daytime dose of seroquel in addition to his other medications. Pt perseverates on chronic low back pain, and he notes, "I've been on street drugs for this for a long time, so nothing you give me is going to touch it - except maybe Lyrica." Discussed with patient that we would attempt to manage some of his chronic pain with gabapentin and her verbalized good understanding. Pt had no further questions, comments, or concerns.   Continued Clinical Symptoms:  Alcohol Use  Disorder Identification Test Final Score (AUDIT): 1 The "Alcohol Use Disorders Identification Test", Guidelines for Use in Primary Care, Second Edition.  World Science writerHealth Organization [facility](WHO). Score between 0-7:  no or low risk or alcohol related problems. Score between 8-15:  moderate risk of alcohol related problems. Score between 16-19:  high risk of alcohol related problems. Score 20 or above:  warrants further diagnostic evaluation for alcohol dependence and treatment.   CLINICAL FACTORS:   Severe Anxiety and/or Agitation Depression:   Comorbid alcohol abuse/dependence Alcohol/Substance Abuse/Dependencies   Musculoskeletal: Strength & Muscle Tone: within normal limits Gait & Station: normal Patient leans: N/A  Psychiatric Specialty Exam: Physical Exam  Nursing note and vitals reviewed.   Review of Systems  Constitutional: Negative for chills and fever.  Respiratory: Negative for cough and shortness of breath.   Cardiovascular: Negative for chest pain.  Gastrointestinal: Negative for abdominal pain, heartburn, nausea and vomiting.  Musculoskeletal: Positive for back pain.  Psychiatric/Behavioral: Positive for depression. The patient is nervous/anxious.     Blood pressure 108/70, pulse 69, temperature 98.4 F (36.9 C), resp. rate 16, height 5\' 11"  (1.803 m), weight 70.5 kg (155 lb 8 oz).Body mass index is 21.69 kg/m.  General Appearance: Casual  Eye Contact:  Fair  Speech:  Clear and Coherent  Volume:  Normal  Mood:  Anxious and Irritable  Affect:  Congruent and Labile  Thought Process:  Goal Directed  Orientation:  Full (Time, Place, and Person)  Thought Content:  Logical and Rumination  Suicidal Thoughts:  Yes.  with intent/plan  Homicidal Thoughts:  No  Memory:  Immediate;   Good Recent;   Good Remote;   Good  Judgement:  Fair  Insight:  Lacking  Psychomotor Activity:  Normal  Concentration:  Concentration: Good  Recall:  Good  Fund of Knowledge:  Good  Language:   Good  Akathisia:  No  Handed:    AIMS (if indicated):     Assets:  Communication Skills Resilience Social Support  ADL's:  Intact  Cognition:  WNL  Sleep:  Number of Hours: 6.5      COGNITIVE FEATURES THAT CONTRIBUTE TO RISK:  Closed-mindedness, Polarized thinking and Thought constriction (tunnel vision)    SUICIDE RISK:   Moderate:  Frequent suicidal ideation with limited intensity, and duration, some specificity in terms of plans, no associated intent, good self-control, limited dysphoria/symptomatology, some risk factors present, and identifiable protective factors, including available and accessible social support.  PLAN OF CARE:  - Admit to inpatient psychiatry - Major Depressive disorder  -Change seroquel 50mg  qhs to seroquel 100mg  qhs  - Start seroquel 50mg  BID (0800 and 1700) - Chronic pain  - Start gabapentin 400mg  TID - Encourage participation in groups and the therapeutic milieu - Discharge planning will be ongoing  I certify that inpatient services furnished can reasonably be expected to improve the patient's condition.   Micheal Likens, MD 06/30/2017, 3:29 PM
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
## 2017-06-30 NOTE — Progress Notes (Signed)
Pt was up to the NS at the beginning of the shift asking for nic gum, Motrin, and "anything else I can get".  He stated "just leave the pyxis open so I can help myself".  Writer told pt that we give prn medications related to the symptoms they have, but pt should also try to use coping skills instead of relying on medications.  Pt was not interested in writer's advice.  He states he is still having pssive suicidal thoughts, but can contract for safety.  He denies HI/AVH.  He spends his time in the dayroom talking with peers and socializing.  He talks a lot about drugs and alcohol.  He makes his needs known to staff.  Support and encouragement offered.  Discharge plans are in process.  sAfety maintained with q15 minute checks.

## 2017-06-30 NOTE — Progress Notes (Signed)
Nutrition Brief Note  Patient identified on the Malnutrition Screening Tool (MST) Report  Patient reports 50 lb weight loss which is not reflected in chart. Pt [facility]  Wt Readings from Last 15 Encounters:  06/29/17 155 lb 8 oz (70.5 kg)  06/28/17 170 lb (77.1 kg)  01/29/17 157 lb 14.4 oz (71.6 kg)  07/02/16 156 lb (70.8 kg)    Body mass index is 21.69 kg/m. Patient meets criteria for normal based on current BMI.  Labs and medications reviewed.   No nutrition interventions warranted at this time. If nutrition issues arise, please consult RD.   Tilda FrancoLindsey Kura Bethards, MS, RD, LDN [facility] Clinical Dietitian Pager: [phone] After Hours Pager: [phone]

## 2017-06-30 NOTE — Progress Notes (Signed)
Recreation Therapy Notes  Animal-Assisted Activity (AAA) Program Checklist/Progress Notes Patient Eligibility Criteria Checklist & Daily Group note for Rec TxIntervention  Date: 10.30.2018 Time: 2:45pm [facility] [address]   AAA/T Program Assumption of Risk Form signed by Patient/ or Parent Legal Guardian Yes  Patient is free of allergies or sever asthma Yes  Patient reports no fear of animals Yes  Patient reports no history of cruelty to animals Yes  Patient understands his/her participation is voluntary Yes  Behavioral Response: Did not attend.    Marykay Lexenise L Yazmina Pareja, LRT/CTRS         Persephonie Hegwood L 06/30/2017 2:56 PM

## 2017-07-01 MED ORDER — ASENAPINE MALEATE 5 MG SL SUBL
5.0000 mg | SUBLINGUAL_TABLET | Freq: Two times a day (BID) | SUBLINGUAL | Status: DC
  Administered 2017-07-01 – 2017-07-02 (×3): 5 mg via SUBLINGUAL
  Filled 2017-07-01 (×8): qty 1

## 2017-07-01 MED ORDER — TRAZODONE HCL 50 MG PO TABS
50.0000 mg | ORAL_TABLET | Freq: Every evening | ORAL | Status: DC | PRN
  Administered 2017-07-01: 50 mg via ORAL
  Filled 2017-07-01: qty 1

## 2017-07-01 NOTE — [facility] Group Notes (Signed)
LCSW Group Therapy Note  07/01/2017 1:15pm  Type of Therapy/Topic:  Group Therapy:  Emotion Regulation  Participation Level: Pt invited. Did not attend.   Marcus [name], MSW, LCSWA 07/01/2017 2:52 PM  

## 2017-07-01 NOTE — Plan of Care (Signed)
Problem: Education: Goal: Ability to make informed decisions regarding treatment will improve Outcome: Progressing Nurse discussed depression/anxiety/coping skills with patient.    
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
## 2017-07-01 NOTE — [facility] Group Notes (Addendum)
Adult Psychoeducational Group Note  Date:  07/01/2017 Time:  3:52 AM  Group Topic/Focus:  Wrap-Up Group:   The focus of this group is to help patients review their daily goal of treatment and discuss progress on daily workbooks.  Participation Level:  Active  Participation Quality:  Appropriate and Attentive  Affect:  Appropriate  Cognitive:  Alert and Appropriate  Insight: Appropriate  Engagement in Group:  Engaged  Modes of Intervention:  Discussion  Additional Comments: Pt attended and participated in group. Pt rated their day a 7/10. Pt goal was to talk to the Dr and SW about going home. A positive for the pt was that they met someone cool.  Cristi Loron 07/01/2017, 3:52 AM
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
## 2017-07-01 NOTE — Progress Notes (Signed)
D:   Patient stated he does have SI thoughts, contracts for safety.  Denied HI.  Patient stated he does hear voices and has visual hallucinations. A:  Patient refused seroquel this morning.  Emotional support and encouragement given patient. R:  Safety maintained with 15 minute checks. Early this morning, patient stated he needs stronger pain medicine.   Patient refused to fill out self inventory form this morning.

## 2017-07-01 NOTE — Progress Notes (Signed)
Pt up and irritable this morning.  When MHT asked if she could get his VS, he cursed at her, saying Y'all don't give me enough f---g meds to get to sleep, then you don't want to give me enough f---g food to eat!!".  He had asked her for snacks which are not given out in the mornings, but coffee is available to patients before breakfast.  MHT told him that breakfast would be at 0645, and he told her, "I don't f---g care; I want some f---g food!!". Pt rude and disrespectful to staff and then boastful to peers about his behavior.   MHT reported pt's behavior to Clinical research associatewriter.

## 2017-07-02 MED ORDER — LITHIUM CARBONATE ER 300 MG PO TBCR
300.0000 mg | EXTENDED_RELEASE_TABLET | Freq: Two times a day (BID) | ORAL | Status: DC
  Administered 2017-07-02 – 2017-07-05 (×6): 300 mg via ORAL
  Filled 2017-07-02 (×10): qty 1

## 2017-07-02 MED ORDER — TRAZODONE HCL 100 MG PO TABS
100.0000 mg | ORAL_TABLET | Freq: Every evening | ORAL | Status: DC | PRN
  Administered 2017-07-02 – 2017-07-14 (×9): 100 mg via ORAL
  Filled 2017-07-02: qty 2
  Filled 2017-07-02 (×7): qty 1
  Filled 2017-07-02: qty 14
  Filled 2017-07-02: qty 1

## 2017-07-02 NOTE — Progress Notes (Signed)
Pt has been up at the NS arguing with staff about wanting to watch YouTube over the TV this morning.  Staff told pt that the TV is put on the News in the mornings, and we do not allow patients to watch YouTube but he was argumentative with the MHT cursing and swearing at her.  He is being intrusive with staff this morning as staff is at the NS discussing the routine of the morning, and he is hanging around the NS making comments that are disruptive and do not pertain to him.  He is difficult to redirect as he wants to argue and disrupt the milieu.

## 2017-07-02 NOTE — Progress Notes (Signed)
Pt's attitude and behaviors have not changed since yesterday.  He continues irritable and verbally aggressive toward staff.  He is frequently up at the NS demanding meds before they are due.  He also is frequently asking for snacks and meals outside of scheduled times.  He can be demanding and rude when asking for things.  He continues to say he has passive suicidal thoughts.  He also says he has thoughts to hurt the man who cheated him out of money.  Staff tries to redirect when pt's behavior is inappropriate.  Support and encouragement offered.  Discharge plans are in process.  Safety maintained with q15 minute checks.

## 2017-07-02 NOTE — Plan of Care (Signed)
Problem: Safety: Goal: Periods of time without injury will increase Outcome: Progressing Pt has not harmed self or others tonight.  He denies SI/HI and verbally contracts for safety.   

## 2017-07-02 NOTE — Progress Notes (Signed)
[facility] Group Notes:  (Nursing/MHT/Case Management/Adjunct)  Date:  07/02/2017  Time:  2045  Type of Therapy:  wrap up group  Participation Level:  Active  Participation Quality:  Appropriate, Attentive, Sharing and Supportive  Affect:  Appropriate  Cognitive:  Appropriate  Insight:  Improving  Engagement in Group:  Engaged  Modes of Intervention:  Clarification, Education and Support  Summary of Progress/Problems: Pt reported being happy with how his medications were adjusted today. Pt shared that he wants and needs long term treatment and [facility]. Pt shared that he wouldn't change anything in his life and he is grateful for his family.  Marcille BuffyMcNeil, Jonnette Nuon S 07/02/2017, 10:26 PM

## 2017-07-02 NOTE — Progress Notes (Signed)
D: Pt presents irritated and easily agitated on approach. During medication pass, when asked what is the pt's name, pt stated "just call me asshole". Pt stated that he's suicidal with a plan to jump in front of a car. Pt verbally contracts for safety. Pt verbalized to writer that he's depressed, anxious and experiencing AVH. Pt would not elaborate. Staff have reported that pt has been calling the MHT on the hall a racist, being very rude and disrespectful. Pt asked to be respectful of staff but has a hard time doing so.  A: Medications reviewed with pt. Medications administered as ordered per MD. Verbal support provided. Pt encouraged to attend groups. 15 minute checks performed for safety. R: Pt non compliant with tx.

## 2017-07-02 NOTE — Progress Notes (Signed)
[facility] MD Progress Note  07/02/2017 3:33 PM Marcus [name]  MRN:  [id number] Subjective:   Marcus [name] is a 41 y/o M with history of MDD and polysubstance dependence who was admitted with worsening depression, SI, HI towards his boss, psychosis, and illicit substance use. He was started initially on seroquel, which caused daytime sedation and yesterday he was switched to saphris. Today pt reports that he had trouble tolerating saphris as well because it caused him to feel "doped up." Discussed with patient that there is typically a period of adjustment to new medications; however, pt was persistent that he wanted a change to something besides saphris. Pt reports AH of "music" and VH of seeing "patterns." He endorses SI with plan to cut his wrist if discharged and HI towards his boss if pt were to see him now, but pt contracts for safety while at [facility]. Discussed with patient about medication options, and pt suggested option of lithium. Pt also noted some difficulty with initial insomnia and we agreed to increase dose of trazodone at bedtime. Pt was referred for an intake screening at [facility] on next Monday (4 days from now), and he was in agreement with this referral. He had no further questions, comments, or concerns.  Principal Problem: MDD (major depressive disorder) Diagnosis:   Patient Active Problem List   Diagnosis Date Noted  . MDD (major depressive disorder) [F32.9] 06/29/2017  . Polysubstance dependence including opioid type drug without complication, episodic abuse (HCC) [F19.20] 07/02/2016   Total Time spent with patient: 30 minutes  Past Psychiatric History: see H&P  Past Medical History:  Past Medical History:  Diagnosis Date  . Hepatitis C    History reviewed. No pertinent surgical history. Family History: History reviewed. No pertinent family history. Family Psychiatric  History: see H&P Social History:  History  Alcohol Use No     History  Drug Use  . Frequency: 7.0 times per  week  . Types: Methamphetamines, Heroin    Comment: Daily use where possible    Social History   Social History  . Marital status: Married    Spouse name: N/A  . Number of children: N/A  . Years of education: N/A   Social History Main Topics  . Smoking status: Current Every Day Smoker  . Smokeless tobacco: Never Used  . Alcohol use No  . Drug use: Yes    Frequency: 7.0 times per week    Types: Methamphetamines, Heroin     Comment: Daily use where possible  . Sexual activity: Not Currently   Other Topics Concern  . None   Social History Narrative  . None   Additional Social History:                         Sleep: Good  Appetite:  Good  Current Medications: Current Facility-Administered Medications  Medication Dose Route Frequency Provider Last Rate Last Dose  . alum & mag hydroxide-simeth (MAALOX/MYLANTA) 200-200-20 MG/5ML suspension 30 mL  30 mL Oral Q4H PRN Oneta Rack, NP      . feeding supplement (ENSURE ENLIVE) (ENSURE ENLIVE) liquid 237 mL  237 mL Oral BID BM Cobos, Fernando A, MD   237 mL at 07/02/17 1410  . gabapentin (NEURONTIN) capsule 400 mg  400 mg Oral TID Micheal Likens, MD   400 mg at 07/02/17 1214  . hydrOXYzine (ATARAX/VISTARIL) tablet 25 mg  25 mg Oral TID PRN Oneta Rack, NP   25  mg at 07/01/17 2124  . ibuprofen (ADVIL,MOTRIN) tablet 600 mg  600 mg Oral Q6H PRN Oneta Rack, NP   600 mg at 07/02/17 1518  . lithium carbonate (LITHOBID) CR tablet 300 mg  300 mg Oral Q12H Micheal Likens, MD   300 mg at 07/02/17 1517  . magnesium hydroxide (MILK OF MAGNESIA) suspension 30 mL  30 mL Oral Daily PRN Oneta Rack, NP      . nicotine polacrilex (NICORETTE) gum 2 mg  2 mg Oral PRN Cobos, Rockey Situ, MD   2 mg at 07/02/17 1518  . traZODone (DESYREL) tablet 100 mg  100 mg Oral QHS PRN Micheal Likens, MD        Lab Results: No results found for this or any previous visit (from the past 48 hour(s)).  Blood  Alcohol level:  Lab Results  Component Value Date   ETH <10 06/28/2017   ETH <5 01/29/2017    Metabolic Disorder Labs: Lab Results  Component Value Date   HGBA1C 5.0 07/06/2016   MPG 97 07/06/2016   Lab Results  Component Value Date   PROLACTIN 4.2 07/06/2016   Lab Results  Component Value Date   CHOL 162 07/06/2016   TRIG 82 07/06/2016   HDL 59 07/06/2016   CHOLHDL 2.7 07/06/2016   VLDL 16 07/06/2016   LDLCALC 87 07/06/2016    Physical Findings: AIMS: Facial and Oral Movements Muscles of Facial Expression: None, normal Lips and Perioral Area: None, normal Jaw: None, normal Tongue: None, normal,Extremity Movements Upper (arms, wrists, hands, fingers): None, normal Lower (legs, knees, ankles, toes): None, normal, Trunk Movements Neck, shoulders, hips: None, normal, Overall Severity Severity of abnormal movements (highest score from questions above): None, normal Incapacitation due to abnormal movements: None, normal Patient's awareness of abnormal movements (rate only patient's report): No Awareness, Dental Status Current problems with teeth and/or dentures?: No Does patient usually wear dentures?: No  CIWA:  CIWA-Ar Total: 1 COWS:  COWS Total Score: 1  Musculoskeletal: Strength & Muscle Tone: within normal limits Gait & Station: normal Patient leans: N/A  Psychiatric Specialty Exam: Physical Exam  Nursing note and vitals reviewed.   Review of Systems  Constitutional: Negative for chills and fever.  Respiratory: Negative for cough and shortness of breath.   Cardiovascular: Negative for chest pain.  Gastrointestinal: Negative for abdominal pain, heartburn, nausea and vomiting.  Musculoskeletal: Positive for back pain.  Psychiatric/Behavioral: Positive for depression, hallucinations and suicidal ideas. The patient is nervous/anxious.     Blood pressure 109/63, pulse 72, temperature 98.1 F (36.7 C), temperature source Oral, resp. rate 12, height 5\' 11"   (1.803 m), weight 70.5 kg (155 lb 8 oz).Body mass index is 21.69 kg/m.  General Appearance: Casual and Fairly Groomed  Eye Contact:  Good  Speech:  Clear and Coherent and Normal Rate  Volume:  Normal  Mood:  Dysphoric and Irritable  Affect:  Congruent and Constricted  Thought Process:  Coherent  Orientation:  Full (Time, Place, and Person)  Thought Content:  Hallucinations: Auditory Visual  Suicidal Thoughts:  Yes.  with intent/plan  Homicidal Thoughts:  Yes.  with intent/plan  Memory:  Immediate;   Good Recent;   Good Remote;   Good  Judgement:  Impaired  Insight:  Lacking  Psychomotor Activity:  Normal  Concentration:  Concentration: Good  Recall:  Good  Fund of Knowledge:  Good  Language:  Good  Akathisia:  No  Handed:    AIMS (if indicated):  Assets:  Communication Skills Desire for Improvement Leisure Time Physical Health Resilience Social Support  ADL's:  Intact  Cognition:  WNL  Sleep:  Number of Hours: 4.25     Treatment Plan Summary: Daily contact with patient to assess and evaluate symptoms and progress in treatment and Medication management. Pt reports some ongoing anxiety, SI, HI, AH, and VH. He did not tolerate saphris and he agrees to change to lithium. [facility] Monday 07/06[facility]17. - Continue inpatient hospitalization -MDD with psychotic features             - Discontinue saphris             - Start lithium CR 300mg  po BID - Anxiety  - Continue atarax 25mg  q6h prn anxiety  - Continue gabapentin 400mg  TID - Insomnia  - Change trazodone 50mg  qhs to trazodone 100mg  qhs  -Encourage participation in groups and therapeutic milieu -Discharge planning will be ongoing  Micheal Likenshristopher T Hilarie Sinha, MD 07/02/2017, 3:33 PM

## 2017-07-02 NOTE — Progress Notes (Signed)
D: Pt was at the nurse's station upon initial approach.  Pt presents with anxious affect and mood.  He is hyperactive and intrusive with staff and peers.  His goal today was to "adjust my meds correctly, I got that done, put me on Lithium and it is straight up working."  Pt denies SI/HI, reports hallucinations of "seeing patterns whirling around, checkers back and forth" and hearing "light music," complains of R foot pain of 8/10.  Pt attended evening group.    A: Introduced self to pt.  Actively listened to pt and offered support and encouragement.   Pt requested PRN medication before it could be given again.  Medication education provided and pt verbalized understanding.  PRN medication administered for anxiety, pain, and sleep.  Q15 minute safety checks maintained.    R: Pt is safe on the unit.  Pt is compliant with medications.  Pt verbally contracts for safety.  Will continue to monitor and assess.

## 2017-07-02 NOTE — [facility] Group Notes (Signed)
The focus of this group is to educate the patient on the purpose and policies of crisis stabilization and provide a format to answer questions about their admission.  The group details unit policies and expectations of patients while admitted.  Patient attended 0900 nurse education orientation group this morning.  Patient actively participated and had appropriate affect.  Patient was alert.  Patient had appropriate insight and appropriate engagements.  Today patient will work on 3 goals for discharge.   

## 2017-07-03 DIAGNOSIS — F333 Major depressive disorder, recurrent, severe with psychotic symptoms: Secondary | ICD-10-CM

## 2017-07-03 MED ORDER — HYDROCORTISONE 1 % EX CREA
TOPICAL_CREAM | CUTANEOUS | Status: AC
  Filled 2017-07-03: qty 28

## 2017-07-03 NOTE — [facility] Suicide Risk Assessment (Signed)
[facility] INPATIENT:  Family/Significant Other Suicide Prevention Education  Suicide Prevention Education:  Patient Refusal for Family/Significant Other Suicide Prevention Education: The patient Marcus ReidDana Mesenbrink has refused to provide written consent for family/significant other to be provided Family/Significant Other Suicide Prevention Education during admission and/or prior to discharge.  Physician notified.  Pt identifies his mother as his support, however, pt does not have the number for her. SPE discussed with pt.  Jonathon JordanLynn B Jakyria Bleau, MSW, LCSWA  07/03/2017, 4:41 PM

## 2017-07-03 NOTE — Progress Notes (Signed)
[facility] MD Progress Note  07/03/2017 2:40 PM Marcus [name]  MRN:  [id number]  Subjective: Marcus [name] reports, "How do you think I'm doing? I'm doing. I need some joint, blunt, weed. That will make me feel better. The medicines are helping, but not as a joint or weed would. Yes, I'm depressed & suicidal, I'm coping okay. Yes, I still hear voices, but I'm dealing with it".  Objective: Marcus [name] is a 41 y/o M with history of MDD and polysubstance dependence who was admitted with worsening depression, SI, HI towards his boss, psychosis, and illicit substance use. He was started initially on seroquel, which caused daytime sedation and yesterday he was switched to saphris. Today pt reports that he had trouble tolerating saphris as well because it caused him to feel "doped up." Discussed with patient that there is typically a period of adjustment to new medications; however, pt was persistent that he wanted a change to something besides saphris. Pt reports AH of "music" and VH of seeing "patterns." He endorses SI with plan to cut his wrist if discharged and HI towards his boss if pt were to see him now, but pt contracts for safety while at [facility]. Discussed with patient about medication options, and pt suggested option of lithium. Pt also noted some difficulty with initial insomnia and we agreed to increase dose of trazodone at bedtime. Pt was referred for an intake screening at [facility] on next Monday (3 days from now), and he was in agreement with this referral. He had no further questions, comments, or concerns. He gets along with his peers. He does not appear to be res[ponding to any internal stimuli. Staff continue to provide support.  Principal Problem: MDD (major depressive disorder) Diagnosis:   Patient Active Problem List   Diagnosis Date Noted  . MDD (major depressive disorder) [F32.9] 06/29/2017  . Polysubstance dependence including opioid type drug without complication, episodic abuse (HCC) [F19.20] 07/02/2016    Total Time spent with patient: 15 minutes  Past Psychiatric History: See H&P  Past Medical History:  Past Medical History:  Diagnosis Date  . Hepatitis C    History reviewed. No pertinent surgical history.  Family History: History reviewed. No pertinent family history.  Family Psychiatric  History: See H&P  Social History:  History  Alcohol Use No     History  Drug Use  . Frequency: 7.0 times per week  . Types: Methamphetamines, Heroin    Comment: Daily use where possible    Social History   Social History  . Marital status: Married    Spouse name: N/A  . Number of children: N/A  . Years of education: N/A   Social History Main Topics  . Smoking status: Current Every Day Smoker  . Smokeless tobacco: Never Used  . Alcohol use No  . Drug use: Yes    Frequency: 7.0 times per week    Types: Methamphetamines, Heroin     Comment: Daily use where possible  . Sexual activity: Not Currently   Other Topics Concern  . None   Social History Narrative  . None   Additional Social History:   Sleep: Good  Appetite:  Good  Current Medications: Current Facility-Administered Medications  Medication Dose Route Frequency Provider Last Rate Last Dose  . alum & mag hydroxide-simeth (MAALOX/MYLANTA) 200-200-20 MG/5ML suspension 30 mL  30 mL Oral Q4H PRN Oneta Rack, NP      . feeding supplement (ENSURE ENLIVE) (ENSURE ENLIVE) liquid 237 mL  237 mL Oral  BID BM Cobos, Rockey SituFernando A, MD   237 mL at 07/03/17 0759  . gabapentin (NEURONTIN) capsule 400 mg  400 mg Oral TID Jolyne Loaainville, Christopher T, MD   400 mg at 07/03/17 1152  . hydrOXYzine (ATARAX/VISTARIL) tablet 25 mg  25 mg Oral TID PRN Oneta RackLewis, Tanika N, NP   25 mg at 07/02/17 2117  . ibuprofen (ADVIL,MOTRIN) tablet 600 mg  600 mg Oral Q6H PRN Oneta RackLewis, Tanika N, NP   600 mg at 07/03/17 0759  . lithium carbonate (LITHOBID) CR tablet 300 mg  300 mg Oral Q12H Micheal Likensainville, Christopher T, MD   300 mg at 07/03/17 0759  . magnesium  hydroxide (MILK OF MAGNESIA) suspension 30 mL  30 mL Oral Daily PRN Oneta RackLewis, Tanika N, NP      . nicotine polacrilex (NICORETTE) gum 2 mg  2 mg Oral PRN Cobos, Rockey SituFernando A, MD   2 mg at 07/03/17 0759  . traZODone (DESYREL) tablet 100 mg  100 mg Oral QHS PRN Micheal Likensainville, Christopher T, MD   100 mg at 07/02/17 2117    Lab Results: No results found for this or any previous visit (from the past 48 hour(s)).  Blood Alcohol level:  Lab Results  Component Value Date   ETH <10 06/28/2017   ETH <5 01/29/2017    Metabolic Disorder Labs: Lab Results  Component Value Date   HGBA1C 5.0 07/06/2016   MPG 97 07/06/2016   Lab Results  Component Value Date   PROLACTIN 4.2 07/06/2016   Lab Results  Component Value Date   CHOL 162 07/06/2016   TRIG 82 07/06/2016   HDL 59 07/06/2016   CHOLHDL 2.7 07/06/2016   VLDL 16 07/06/2016   LDLCALC 87 07/06/2016    Physical Findings: AIMS: Facial and Oral Movements Muscles of Facial Expression: None, normal Lips and Perioral Area: None, normal Jaw: None, normal Tongue: None, normal,Extremity Movements Upper (arms, wrists, hands, fingers): None, normal Lower (legs, knees, ankles, toes): None, normal, Trunk Movements Neck, shoulders, hips: None, normal, Overall Severity Severity of abnormal movements (highest score from questions above): None, normal Incapacitation due to abnormal movements: None, normal Patient's awareness of abnormal movements (rate only patient's report): No Awareness, Dental Status Current problems with teeth and/or dentures?: No Does patient usually wear dentures?: No  CIWA:  CIWA-Ar Total: 1 COWS:  COWS Total Score: 1  Musculoskeletal: Strength & Muscle Tone: within normal limits Gait & Station: normal Patient leans: N/A  Psychiatric Specialty Exam: Physical Exam  Nursing note and vitals reviewed.   Review of Systems  Constitutional: Negative for chills and fever.  Respiratory: Negative for cough and shortness of breath.    Cardiovascular: Negative for chest pain.  Gastrointestinal: Negative for abdominal pain, heartburn, nausea and vomiting.  Musculoskeletal: Positive for back pain.  Psychiatric/Behavioral: Positive for depression, hallucinations and suicidal ideas. The patient is nervous/anxious.     Blood pressure 130/71, pulse 82, temperature 98.1 F (36.7 C), temperature source Oral, resp. rate 12, height 5\' 11"  (1.803 m), weight 70.5 kg (155 lb 8 oz).Body mass index is 21.69 kg/m.  General Appearance: Casual and Fairly Groomed  Eye Contact:  Good  Speech:  Clear and Coherent and Normal Rate  Volume:  Normal  Mood:  Dysphoric and Irritable  Affect:  Congruent and Constricted  Thought Process:  Coherent  Orientation:  Full (Time, Place, and Person)  Thought Content:  Hallucinations: Auditory Visual  Suicidal Thoughts:  Yes.  with intent/plan  Homicidal Thoughts:  Yes.  with intent/plan  Memory:  Immediate;   Good Recent;   Good Remote;   Good  Judgement:  Impaired  Insight:  Lacking  Psychomotor Activity:  Normal  Concentration:  Concentration: Good  Recall:  Good  Fund of Knowledge:  Good  Language:  Good  Akathisia:  No  Handed:    AIMS (if indicated):     Assets:  Communication Skills Desire for Improvement Leisure Time Physical Health Resilience Social Support  ADL's:  Intact  Cognition:  WNL  Sleep:  Number of Hours: 6   Treatment Plan Summary: Daily contact with patient to assess and evaluate symptoms and progress in treatment and Medication management. Pt reports some ongoing anxiety, SI, HI, AH, and VH. He did not tolerate saphris and he agrees to change to lithium. He was referred to [facility] for substance use intake on Monday 07/06/17.  Will continue today 07/03/17 plan as below except where it is noted.  - Continue inpatient hospitalization   -MDD with psychotic features             - Discontinue saphris             - Continue lithium CR 300mg  po BID.             -  Obtain Lithium level on 11- - Anxiety  - Continue atarax 25mg  q6h prn anxiety  - Continue gabapentin 400mg  TID on Sunday am 07-05-17 - Insomnia  - Continue trazodone 100mg  qhs  -Encourage participation in groups and therapeutic milieu -Discharge planning will be ongoing  Sanjuana Kava, NP, PMHNP, FNP-BC 07/03/2017, 2:40 PMPatient ID: [name], male   DOB: 1975/10/11, 41 y.o.   MRN: [id number]

## 2017-07-03 NOTE — Plan of Care (Signed)
Problem: Activity: Goal: Interest or engagement in activities will improve Outcome: Progressing Patuient is participating in unit activities and unit milieu. Goal: Sleeping patterns will improve Outcome: Not Progressing Reports continued tiredness, even with sleeping, "Well", last evening.  Problem: Education: Goal: Emotional status will improve Outcome: Not Progressing Continues to report SI, but is able to verbally contract for safety.

## 2017-07-03 NOTE — [facility] Group Notes (Signed)
[facility] 07/03/2017 1:15pm  Type of Therapy: Mental Health Association Presentation  Participation Level: Active  Participation Quality: Attentive  Affect: Appropriate  Cognitive: Oriented  Insight: Developing/Improving  Engagement in Therapy: Engaged  Modes of Intervention: Discussion, Education and Socialization  Summary of Progress/Problems: Mental Health Association (MHA) Speaker came to talk about his personal journey with mental health. The pt processed ways by which to relate to the speaker. MHA speaker provided handouts and educational information pertaining to groups and services offered by the [facility]. Pt was engaged in speaker's presentation and was receptive to resources provided.    Pulte HomesHeather N Smart, LCSW 07/03/2017 10:08 AM

## 2017-07-03 NOTE — Tx Team (Signed)
Interdisciplinary Treatment and Diagnostic Plan Update 07/03/2017 Time of Session: 9:30am  Marcus [name]: [id number]  Principal Diagnosis: MDD (major depressive disorder)  Secondary Diagnoses: Principal Problem:   MDD (major depressive disorder)   Current Medications:  Current Facility-Administered Medications  Medication Dose Route Frequency Provider Last Rate Last Dose  . alum & mag hydroxide-simeth (MAALOX/MYLANTA) 200-200-20 MG/5ML suspension 30 mL  30 mL Oral Q4H PRN Oneta RackLewis, Tanika N, NP      . feeding supplement (ENSURE ENLIVE) (ENSURE ENLIVE) liquid 237 mL  237 mL Oral BID BM Cobos, Rockey SituFernando A, MD   237 mL at 07/03/17 0759  . gabapentin (NEURONTIN) capsule 400 mg  400 mg Oral TID Micheal Likensainville, Christopher T, MD   400 mg at 07/03/17 0755  . hydrOXYzine (ATARAX/VISTARIL) tablet 25 mg  25 mg Oral TID PRN Oneta RackLewis, Tanika N, NP   25 mg at 07/02/17 2117  . ibuprofen (ADVIL,MOTRIN) tablet 600 mg  600 mg Oral Q6H PRN Oneta RackLewis, Tanika N, NP   600 mg at 07/03/17 0759  . lithium carbonate (LITHOBID) CR tablet 300 mg  300 mg Oral Q12H Micheal Likensainville, Christopher T, MD   300 mg at 07/03/17 0759  . magnesium hydroxide (MILK OF MAGNESIA) suspension 30 mL  30 mL Oral Daily PRN Oneta RackLewis, Tanika N, NP      . nicotine polacrilex (NICORETTE) gum 2 mg  2 mg Oral PRN Cobos, Rockey SituFernando A, MD   2 mg at 07/03/17 0759  . traZODone (DESYREL) tablet 100 mg  100 mg Oral QHS PRN Micheal Likensainville, Christopher T, MD   100 mg at 07/02/17 2117    PTA Medications: Prescriptions Prior to Admission  Medication Sig Dispense Refill Last Dose  . albuterol (PROVENTIL HFA;VENTOLIN HFA) 108 (90 Base) MCG/ACT inhaler Inhale 1 puff into the lungs every 6 (six) hours as needed for wheezing or shortness of breath.   rescue at rescue  . gabapentin (NEURONTIN) 400 MG capsule Take 400 mg by mouth 3 (three) times daily.   Past Month at Unknown time  . Multiple Vitamin (MULTIVITAMIN) tablet Take 1 tablet by mouth daily.   Past Month at Unknown time     Treatment Modalities: Medication Management, Group therapy, Case management,  1 to 1 session with clinician, Psychoeducation, Recreational therapy.  Patient Stressors: Financial difficulties Health problems Medication change or noncompliance Occupational concerns Substance abuse Patient Strengths: Average or above average intelligence Wellsite geologistCommunication skills General fund of knowledge Motivation for treatment/growth Work Firefighterskills  Physician Treatment Plan for Primary Diagnosis: MDD (major depressive disorder) Long Term Goal(s): Improvement in symptoms so as ready for discharge Short Term Goals: Ability to identify changes in lifestyle to reduce recurrence of condition will improve Ability to disclose and discuss suicidal ideas Ability to demonstrate self-control will improve Ability to disclose and discuss suicidal ideas Ability to identify and develop effective coping behaviors will improve Compliance with prescribed medications will improve  Medication Management: Evaluate patient's response, side effects, and tolerance of medication regimen.  Therapeutic Interventions: 1 to 1 sessions, Unit Group sessions and Medication administration.  Evaluation of Outcomes: Not Progressing  Physician Treatment Plan for Secondary Diagnosis: Principal Problem:   MDD (major depressive disorder)  Long Term Goal(s): Improvement in symptoms so as ready for discharge  Short Term Goals: Ability to identify changes in lifestyle to reduce recurrence of condition will improve Ability to disclose and discuss suicidal ideas Ability to demonstrate self-control will improve Ability to disclose and discuss suicidal ideas Ability to identify and develop effective coping behaviors  will improve Compliance with prescribed medications will improve  Medication Management: Evaluate patient's response, side effects, and tolerance of medication regimen.  Therapeutic Interventions: 1 to 1 sessions, Unit Group  sessions and Medication administration.  Evaluation of Outcomes: Not Progressing  RN Treatment Plan for Primary Diagnosis: MDD (major depressive disorder) Long Term Goal(s): Knowledge of disease and therapeutic regimen to maintain health will improve  Short Term Goals: Ability to identify and develop effective coping behaviors will improve and Compliance with prescribed medications will improve  Medication Management: RN will administer medications as ordered by provider, will assess and evaluate patient's response and provide education to patient for prescribed medication. RN will report any adverse and/or side effects to prescribing provider.  Therapeutic Interventions: 1 on 1 counseling sessions, Psychoeducation, Medication administration, Evaluate responses to treatment, Monitor vital signs and CBGs as ordered, Perform/monitor CIWA, COWS, AIMS and Fall Risk screenings as ordered, Perform wound care treatments as ordered.  Evaluation of Outcomes: Progressing  LCSW Treatment Plan for Primary Diagnosis: MDD (major depressive disorder) Long Term Goal(s): Safe transition to appropriate next level of care at discharge, Engage patient in therapeutic group addressing interpersonal concerns. Short Term Goals: Engage patient in aftercare planning with referrals and resources, Facilitate patient progression through stages of change regarding substance use diagnoses and concerns, Identify triggers associated with mental health/substance abuse issues and Increase skills for wellness and recovery  Therapeutic Interventions: Assess for all discharge needs, 1 to 1 time with Social worker, Explore available resources and support systems, Assess for adequacy in community support network, Educate family and significant other(s) on suicide prevention, Complete Psychosocial Assessment, Interpersonal group therapy.  Evaluation of Outcomes: Progressing  Progress in Treatment: Attending groups: Intermittently    Participating in groups: Yes, when he attends. Taking medication as prescribed: Yes, MD continues to assess for medication changes as needed Toleration medication: Yes, no side effects reported at this time Family/Significant other contact made: No, CSW assessing for appropriate contact Patient understands diagnosis: Limited insight  Discussing patient identified problems/goals with staff: Yes Medical problems stabilized or resolved: Yes Denies suicidal/homicidal ideation: Yes  Issues/concerns per patient self-inventory: None Other: N/A  New problem(s) identified: Pt continues to present with limited insight and minimal engagement in treatment. Pt also remains agitated on the unit.  New Short Term/Long Term Goal(s): "I want to get on meds that work"   Discharge Plan or Barriers: Pt will discharge to a residential treatment facility.   Reason for Continuation of Hospitalization:  Depression Medication stabilization Suicidal ideation Withdrawal symptoms  Estimated Length of Stay: 1-3 days; Estimated discharge date 07/06/17  Attendees: Patient:  07/03/2017 9:46 AM  Physician: Dr. Altamese Waveland 07/03/2017 9:46 AM  Nursing: Boyd Kerbs, RN 07/03/2017 9:46 AM  RN Care Manager:  07/03/2017 9:46 AM  Social Worker: Donnelly Stager, LCSWA 07/03/2017 9:46 AM  Recreational Therapist:  07/03/2017 9:46 AM  Other: Reola Calkins, NP 07/03/2017 9:46 AM  Other:  07/03/2017 9:46 AM  Other: 07/03/2017 9:46 AM  Scribe for Treatment Team: Jonathon Jordan, MSW,LCSWA 07/03/2017 9:46 AM

## 2017-07-03 NOTE — Progress Notes (Signed)
Data.Patient endorses SI, with a plan of "Cutting myself up and just watching the blood flow." When asked how he would achieve that on the unit, patient stated, "See that plastic wrapper? I bet I could put that to good use." Patient also reports AVH. "I see swirls and hear music. It sounds like Satan's music." Patient verbally contracts for safety, is able to verbally contract for safety on the unit and to come to staff if SI becomes overwhelming. Patient told nurse to, "Beg me to contract for safety. I only will if you beg me." Nurse replied that if he was not able to verbally contract for safety, without the nurse, "Begging", then he would have to go one a 1:1 for his safety. Patient  Stated to nurse, "I have found a way to make money and not doing anything. I am not going to tell you what it is, however, 'cause then I will be out of a job." On his self assessment he reports 3/ 10 for depression, anxiety and hopelessness. Action. Emotional support and encouragement offered. Education provided on medication, indications and side effect. Q 15 minute checks done for safety. Response. Safety on the unit maintained through 15 minute checks.  Medications taken as prescribed. Attended groups. Remained calm through out shift.

## 2017-07-03 NOTE — Progress Notes (Signed)
Nursing Progress Note: 7p-7a D: Pt currently presents with a flirtatious/silly/childlike affect and behavior. Pt states "I have off and on hallucinations. I see waves and hear the devil's music sometimes." Interacting appropriately with the milieu. Pt reports fair sleep during the previous night with current medication regimen. Pt did attend wrap-up group.  A: Pt provided with medications per providers orders. Pt's labs and vitals were monitored throughout the night. Pt supported emotionally and encouraged to express concerns and questions. Pt educated on medications.  R: Pt's safety ensured with 15 minute and environmental checks. Pt currently denies SI and HI and endorses AVH. Pt verbally contracts to seek staff if SI,HI, or AVH occurs and to consult with staff before acting on any harmful thoughts. Will continue to monitor.

## 2017-07-03 NOTE — [facility] Group Notes (Signed)
LCSW Group Therapy Note  07/03/2017 1:15pm  Type of Therapy and Topic:  Group Therapy:  Feelings around Relapse and Recovery  Participation Level:  Did Not Attend-pt invited. Chose to remain in bed.    Description of Group:    Patients in this group will discuss emotions they experience before and after a relapse. They will process how experiencing these feelings, or avoidance of experiencing them, relates to having a relapse. Facilitator will guide patients to explore emotions they have related to recovery. Patients will be encouraged to process which emotions are more powerful. They will be guided to discuss the emotional reaction significant others in their lives may have to their relapse or recovery. Patients will be assisted in exploring ways to respond to the emotions of others without this contributing to a relapse.  Therapeutic Goals: 1. Patient will identify two or more emotions that lead to a relapse for them 2. Patient will identify two emotions that result when they relapse 3. Patient will identify two emotions related to recovery 4. Patient will demonstrate ability to communicate their needs through discussion and/or role plays   Summary of Patient Progress:     Therapeutic Modalities:   Cognitive Behavioral Therapy Solution-Focused Therapy Assertiveness Training Relapse Prevention Therapy   Ledell PeoplesHeather N Smart, LCSW 07/03/2017 2:52 PM

## 2017-07-04 ENCOUNTER — Encounter (HOSPITAL_COMMUNITY): Payer: Self-pay | Admitting: Behavioral Health

## 2017-07-04 ENCOUNTER — Other Ambulatory Visit: Payer: Self-pay

## 2017-07-04 DIAGNOSIS — Z564 Discord with boss and workmates: Secondary | ICD-10-CM

## 2017-07-04 MED ORDER — QUETIAPINE FUMARATE 50 MG PO TABS
50.0000 mg | ORAL_TABLET | Freq: Once | ORAL | Status: AC
  Administered 2017-07-04: 50 mg via ORAL
  Filled 2017-07-04 (×2): qty 1

## 2017-07-04 MED ORDER — LORAZEPAM 1 MG PO TABS
1.0000 mg | ORAL_TABLET | Freq: Four times a day (QID) | ORAL | Status: DC | PRN
  Administered 2017-07-04 – 2017-07-06 (×4): 1 mg via ORAL
  Filled 2017-07-04 (×4): qty 1

## 2017-07-04 MED ORDER — QUETIAPINE FUMARATE ER 50 MG PO TB24
50.0000 mg | ORAL_TABLET | Freq: Every day | ORAL | Status: DC
  Filled 2017-07-04 (×2): qty 1

## 2017-07-04 MED ORDER — QUETIAPINE FUMARATE 100 MG PO TABS
100.0000 mg | ORAL_TABLET | Freq: Every day | ORAL | Status: DC
  Administered 2017-07-04: 100 mg via ORAL
  Filled 2017-07-04 (×2): qty 1

## 2017-07-04 NOTE — Plan of Care (Signed)
Problem: Activity: Goal: Interest or engagement in activities will improve Outcome: Progressing Patient is participating in all unit activities and is active in the ,milieu Goal: Sleeping patterns will improve Outcome: Progressing Patient reports he is getting better sleep at night.  Problem: Education: Goal: Emotional status will improve Outcome: Progressing Patient is observed acting silly with peers and staff and smiling and laughing spontaneously.  Problem: Safety: Goal: Periods of time without injury will increase Outcome: Progressing No injury sustained, self inflicted, since admit.

## 2017-07-04 NOTE — Progress Notes (Signed)
Patient ID: Marcus ReidDana Hallmark, male   DOB: Feb 03, 1976, 41 y.o.   MRN: [id number]   EKG completed, and placed on chart. Jacquelyne BalintShalita Alayna Mabe RN

## 2017-07-04 NOTE — Progress Notes (Signed)
[facility] MD Progress Note  07/04/2017 9:32 AM Marcus [name]  MRN:  [id number] Subjective:  " things are going good."  Evaluation on the unit: Face to face evaluation completed and chart reviewed 07/04/2017.  This is a  41 y/o M with history of MDD and polysubstance dependence who was admitted with worsening depression, SI, HI towards his boss, psychosis, and illicit substance use. During this evaluation, patient is alert and oriented x4, calm and cooperative. He presents as very childlike and silly during the evaluation and does not appear to be taking the evaluation seriously. He endorses HI towards boss once discharged and this seems to be an ongoing pattern. Denies any active or passive SI with plan or intent today. Endorses seeing, " turtles" although he does not appear to be internally preoccupied and jokes when discussing the question. He remains very inappropriate with male staff as he is very flirtatious. Denies AH, self-harming urges or passive death wishes. Denies any withdrawal symptoms. Reports no concerns with appetite, sleeping pattern or medications. Remains receptive to discharge plans to [facility] next Monday. At this time, he contracts for safety on the unit.     Principal Problem: MDD (major depressive disorder) Diagnosis:   Patient Active Problem List   Diagnosis Date Noted  . MDD (major depressive disorder) [F32.9] 06/29/2017  . Polysubstance dependence including opioid type drug without complication, episodic abuse (HCC) [F19.20] 07/02/2016   Total Time spent with patient: 30 minutes  Past Psychiatric History: see H&P  Past Medical History:  Past Medical History:  Diagnosis Date  . Hepatitis C    History reviewed. No pertinent surgical history. Family History: History reviewed. No pertinent family history. Family Psychiatric  History: see H&P Social History:  History  Alcohol Use No     History  Drug Use  . Frequency: 7.0 times per week  . Types: Methamphetamines,  Heroin    Comment: Daily use where possible    Social History   Social History  . Marital status: Married    Spouse name: N/A  . Number of children: N/A  . Years of education: N/A   Social History Main Topics  . Smoking status: Current Every Day Smoker  . Smokeless tobacco: Never Used  . Alcohol use No  . Drug use: Yes    Frequency: 7.0 times per week    Types: Methamphetamines, Heroin     Comment: Daily use where possible  . Sexual activity: Not Currently   Other Topics Concern  . None   Social History Narrative  . None   Additional Social History:         Sleep: Good  Appetite:  Good  Current Medications: Current Facility-Administered Medications  Medication Dose Route Frequency Provider Last Rate Last Dose  . hydrocortisone cream 1 %           . alum & mag hydroxide-simeth (MAALOX/MYLANTA) 200-200-20 MG/5ML suspension 30 mL  30 mL Oral Q4H PRN Oneta Rack, NP      . feeding supplement (ENSURE ENLIVE) (ENSURE ENLIVE) liquid 237 mL  237 mL Oral BID BM Cobos, Rockey Situ, MD   237 mL at 07/04/17 4098  . gabapentin (NEURONTIN) capsule 400 mg  400 mg Oral TID Micheal Likens, MD   400 mg at 07/04/17 0809  . hydrOXYzine (ATARAX/VISTARIL) tablet 25 mg  25 mg Oral TID PRN Oneta Rack, NP   25 mg at 07/03/17 2118  . ibuprofen (ADVIL,MOTRIN) tablet 600 mg  600 mg Oral Q6H  PRN Oneta Rack, NP   600 mg at 07/03/17 1950  . lithium carbonate (LITHOBID) CR tablet 300 mg  300 mg Oral Q12H Micheal Likens, MD   300 mg at 07/04/17 0809  . magnesium hydroxide (MILK OF MAGNESIA) suspension 30 mL  30 mL Oral Daily PRN Oneta Rack, NP      . nicotine polacrilex (NICORETTE) gum 2 mg  2 mg Oral PRN Cobos, Rockey Situ, MD   2 mg at 07/03/17 0759  . traZODone (DESYREL) tablet 100 mg  100 mg Oral QHS PRN Micheal Likens, MD   100 mg at 07/03/17 2119    Lab Results: No results found for this or any previous visit (from the past 48 hour(s)).  Blood  Alcohol level:  Lab Results  Component Value Date   ETH <10 06/28/2017   ETH <5 01/29/2017    Metabolic Disorder Labs: Lab Results  Component Value Date   HGBA1C 5.0 07/06/2016   MPG 97 07/06/2016   Lab Results  Component Value Date   PROLACTIN 4.2 07/06/2016   Lab Results  Component Value Date   CHOL 162 07/06/2016   TRIG 82 07/06/2016   HDL 59 07/06/2016   CHOLHDL 2.7 07/06/2016   VLDL 16 07/06/2016   LDLCALC 87 07/06/2016    Physical Findings: AIMS: Facial and Oral Movements Muscles of Facial Expression: None, normal Lips and Perioral Area: None, normal Jaw: None, normal Tongue: None, normal,Extremity Movements Upper (arms, wrists, hands, fingers): None, normal Lower (legs, knees, ankles, toes): None, normal, Trunk Movements Neck, shoulders, hips: None, normal, Overall Severity Severity of abnormal movements (highest score from questions above): None, normal Incapacitation due to abnormal movements: None, normal Patient's awareness of abnormal movements (rate only patient's report): No Awareness, Dental Status Current problems with teeth and/or dentures?: No Does patient usually wear dentures?: No  CIWA:  CIWA-Ar Total: 1 COWS:  COWS Total Score: 1  Musculoskeletal: Strength & Muscle Tone: within normal limits Gait & Station: normal Patient leans: N/A  Psychiatric Specialty Exam: Physical Exam  Nursing note and vitals reviewed. Constitutional: He is oriented to person, place, and time.  Neurological: He is alert and oriented to person, place, and time.    Review of Systems  Constitutional: Negative for chills and fever.  Respiratory: Negative for cough and shortness of breath.   Cardiovascular: Negative for chest pain.  Gastrointestinal: Negative for abdominal pain, heartburn, nausea and vomiting.  Musculoskeletal: Positive for back pain.  Psychiatric/Behavioral: Positive for depression, hallucinations and suicidal ideas. The patient is nervous/anxious.      Blood pressure (!) 111/42, pulse 74, temperature 98.7 F (37.1 C), temperature source Oral, resp. rate 18, height 5\' 11"  (1.803 m), weight 155 lb 8 oz (70.5 kg).Body mass index is 21.69 kg/m.  General Appearance: Casual and Fairly Groomed  Eye Contact:  Good  Speech:  Clear and Coherent and Normal Rate  Volume:  Normal  Mood:  Dysphoric and Irritable  Affect:  Congruent and Constricted  Thought Process:  Coherent  Orientation:  Full (Time, Place, and Person)  Thought Content:  Hallucinations: Auditory Visual  Suicidal Thoughts:  denies any at this time  Homicidal Thoughts:  Yes.  with intent/plan  Memory:  Immediate;   Good Recent;   Good Remote;   Good  Judgement:  Impaired  Insight:  Lacking  Psychomotor Activity:  Normal  Concentration:  Concentration: Good  Recall:  Good  Fund of Knowledge:  Good  Language:  Good  Akathisia:  No  Handed:    AIMS (if indicated):     Assets:  Communication Skills Desire for Improvement Leisure Time Physical Health Resilience Social Support  ADL's:  Intact  Cognition:  WNL  Sleep:  Number of Hours: 6.5     Treatment Plan Summary: Reviewed current treatment plan. Will continue the dollowing without adjustments at this time;  Daily contact with patient to assess and evaluate symptoms and progress in treatment and Medication management. Pt reports some ongoing anxiety, SI, HI, AH, and VH. He did not tolerate saphris and he agrees to change to lithium. [facility]. - Continue inpatient hospitalization -MDD with psychotic features             - Continue lithium CR 300mg  po BID - Anxiety  - Continue atarax 25mg  q6h prn anxiety  - Continue gabapentin 400mg  TID - Insomnia  - Continue trazodone 100mg  qhs  -Encourage participation in groups and therapeutic milieu -Discharge planning will be ongoing -Lithium level ordered.  Marcus MagnusonLaShunda Thomas, NP 07/04/2017, 9:32 AM     Patient ID: Marcus [name], male   DOB: [birth year], 41 y.o.   MRN: [id number]  Agree with NP Progress Note

## 2017-07-04 NOTE — [facility] Group Notes (Addendum)
[facility] LCSW Group Therapy Note  07/04/2017 at  10:20 to 11:15 AM  Type of Therapy and Topic:  Group Therapy: Avoiding Self-Sabotaging and Enabling Behaviors  Participation Level:  Active   Description of Group The main focus of today's process group to discuss what "self-sabotage" means and use motivational iInterviewing to discuss what benefits, negative or positive, were involved in a self-identified self-sabotaging behavior. We then talked about reasons the patient may want to change the behavior and their current desire to change.   Summary of Patient Progress: Patient presented with nonchalant attention seeking behavior including intrusive comments and tangential remarks. Attempts to monopolize were avoided with redirection and ultimately askingh patient to not participate.    Therapeutic molalities: Cognitive Behavioral Therapy Person-Centered Therapy Motivational Interviewing  Therapeutic Goals: 1. Patients will demonstrate understanding of the concept of self sabotage 2. Patients will be able to identify pros and cons of their behaviors 3. Patients will be able to identify at least two motivating factors for l of their desire for change   Marcus Bernatherine C Shravya Wickwire, LCSW

## 2017-07-04 NOTE — Progress Notes (Addendum)
Patient continues to say that he has SI, today without a plan. He also states, "I see turtles on the wall and there is mucic from satan." Verbally contracts for safety on the unit and to come to staff before acting of any self harm thoughts/feelings/voices.  Patient is very inappropriate with both staff and other peers. He has asked every person on the 300 unit, staff and peers for their home numbers. He is sexually sugestive and he has been redirected multiple times to not touch peers, who he like to hug close. To meal time nurse went into patient''s room and smelled smoke. Room was searched per protocol. [facility] if he has cigarettes or a lighter. Patient did give over one lighter and a partial cigarette. Order was received form MD to move patient to 500 hall, restrict him to the unit and to restrict visitors for 24 hours. Report given to 500 hall nurse. Q 15 minute checks done for safety.

## 2017-07-04 NOTE — Progress Notes (Signed)
Adult Psychoeducational Group Note  Date:  07/04/2017 Time:  8:37 PM  Group Topic/Focus:  Wrap-Up Group:   The focus of this group is to help patients review their daily goal of treatment and discuss progress on daily workbooks.  Participation Level:  Active  Participation Quality:  Appropriate  Affect:  Appropriate  Cognitive:  Appropriate  Insight: Appropriate  Engagement in Group:  Engaged  Modes of Intervention:  Discussion  Additional Comments: The patient expressed that his issue with staff was not good today. The patient also said that having hair styled was a healthy coping skill for him.  Marcus [name], Marcus [name] 07/04/2017, 8:37 PM

## 2017-07-05 ENCOUNTER — Other Ambulatory Visit: Payer: Self-pay

## 2017-07-05 LAB — LITHIUM LEVEL: LITHIUM LVL: 0.27 mmol/L — AB (ref 0.60–1.20)

## 2017-07-05 MED ORDER — ZIPRASIDONE MESYLATE 20 MG IM SOLR
20.0000 mg | Freq: Once | INTRAMUSCULAR | Status: AC
  Administered 2017-07-05: 20 mg via INTRAMUSCULAR
  Filled 2017-07-05: qty 20

## 2017-07-05 MED ORDER — QUETIAPINE FUMARATE 50 MG PO TABS
50.0000 mg | ORAL_TABLET | Freq: Four times a day (QID) | ORAL | Status: DC | PRN
  Administered 2017-07-05 – 2017-07-12 (×8): 50 mg via ORAL
  Filled 2017-07-05: qty 30
  Filled 2017-07-05 (×11): qty 1

## 2017-07-05 MED ORDER — KETOROLAC TROMETHAMINE 10 MG PO TABS
ORAL_TABLET | ORAL | Status: AC
  Filled 2017-07-05: qty 2

## 2017-07-05 MED ORDER — DIPHENHYDRAMINE HCL 50 MG/ML IJ SOLN
INTRAMUSCULAR | Status: AC
  Administered 2017-07-05: 50 mg via INTRAVENOUS
  Filled 2017-07-05: qty 1

## 2017-07-05 MED ORDER — ZIPRASIDONE MESYLATE 20 MG IM SOLR
INTRAMUSCULAR | Status: AC
  Administered 2017-07-05: 20 mg via INTRAMUSCULAR
  Filled 2017-07-05: qty 20

## 2017-07-05 MED ORDER — LITHIUM CARBONATE ER 300 MG PO TBCR
300.0000 mg | EXTENDED_RELEASE_TABLET | Freq: Once | ORAL | Status: AC
  Administered 2017-07-05: 300 mg via ORAL
  Filled 2017-07-05: qty 1

## 2017-07-05 MED ORDER — ZIPRASIDONE MESYLATE 20 MG IM SOLR
10.0000 mg | Freq: Once | INTRAMUSCULAR | Status: DC
  Filled 2017-07-05: qty 20

## 2017-07-05 MED ORDER — LITHIUM CARBONATE ER 300 MG PO TBCR
600.0000 mg | EXTENDED_RELEASE_TABLET | Freq: Two times a day (BID) | ORAL | Status: DC
  Administered 2017-07-06 – 2017-07-14 (×19): 600 mg via ORAL
  Filled 2017-07-05 (×11): qty 2
  Filled 2017-07-05 (×2): qty 56
  Filled 2017-07-05 (×13): qty 2

## 2017-07-05 MED ORDER — LORAZEPAM 2 MG/ML IJ SOLN
2.0000 mg | INTRAMUSCULAR | Status: AC
  Administered 2017-07-05: 2 mg via INTRAMUSCULAR

## 2017-07-05 MED ORDER — ZIPRASIDONE MESYLATE 20 MG IM SOLR
20.0000 mg | Freq: Two times a day (BID) | INTRAMUSCULAR | Status: DC | PRN
  Filled 2017-07-05: qty 20

## 2017-07-05 MED ORDER — KETOROLAC TROMETHAMINE 10 MG PO TABS
20.0000 mg | ORAL_TABLET | Freq: Once | ORAL | Status: AC
  Administered 2017-07-05: 20 mg via ORAL
  Filled 2017-07-05: qty 2

## 2017-07-05 MED ORDER — LORAZEPAM 1 MG PO TABS: 1.0000 mg | ORAL_TABLET | ORAL | Status: DC | PRN

## 2017-07-05 MED ORDER — QUETIAPINE FUMARATE 200 MG PO TABS
200.0000 mg | ORAL_TABLET | Freq: Every day | ORAL | Status: DC
  Administered 2017-07-07 – 2017-07-14 (×9): 200 mg via ORAL
  Filled 2017-07-05 (×8): qty 1
  Filled 2017-07-05: qty 14
  Filled 2017-07-05 (×3): qty 1

## 2017-07-05 MED ORDER — DIPHENHYDRAMINE HCL 50 MG/ML IJ SOLN
50.0000 mg | INTRAMUSCULAR | Status: AC
  Administered 2017-07-05: 50 mg via INTRAVENOUS
  Filled 2017-07-05: qty 1

## 2017-07-05 MED ORDER — LORAZEPAM 2 MG/ML IJ SOLN
INTRAMUSCULAR | Status: AC
  Administered 2017-07-05: 2 mg via INTRAMUSCULAR
  Filled 2017-07-05: qty 1

## 2017-07-05 NOTE — Progress Notes (Signed)
Pt got up used the bathroom 2330 then went back to sleep

## 2017-07-05 NOTE — Progress Notes (Signed)
Patient ID: Marcus [name], male   DOB: 08-31-1976, 41 y.o.   MRN: [id number]   Pt continues to be very intrusive, disrespectful, and sexual inappropriate on the unit. He woke up asking to be discharge, staff told patient that they were waiting on the doctor in hopes that he could be discharged. Pt then stated to staff "I will not give you what you want I am not going anywhere I am going to stay here for a fucking month and make your life a living hell." He continued to repeat the same thing over and over again until staff asked him to go to the dayroom. Pt then started to communicate threats to staff saying that staff was going to get theirs. Pt also started going to other patients reporting that staff was racist, and that were out to get him. Security was called, patient then stated "I will go in the dayroom I will just go and talk to these sexy nurses." He went to the dayroom and started talking to the nursing students, he became very close to them and had to be redirected by staff. When redirected by staff he then stated "see I told ya'll she was racist." The pt then walked up to the nursing instructor and thanked her for bringing the nursing students to him, and proceeded to laugh. Pt has no insight, is not vested in treatment, and remains very disrespectful and sexually inappropriate.

## 2017-07-05 NOTE — Progress Notes (Signed)
Patient verbally aggressive with this writer, verbalizes "I don't want you down here on this hall, go back down there." Gestures toward 500 hall door. Offered support and encouragement, offered to speak with patient regarding specific concerns, patient declines.

## 2017-07-05 NOTE — BHH Counselor (Signed)
Patient requested CSW advocate for him to return to 300 hall; CSW encouraged need for patient to remain where he is due to visual hallucinations he is currently experiencing and focus on his treatment verses staff; patient refused to return to group therapy session which was in progress.   Carney Bernatherine C Veneda Kirksey, LCSW

## 2017-07-05 NOTE — [facility] Group Notes (Signed)
[facility]  Date/Time:  07/05/2017  11:00AM-12:00PM  Type of Therapy and Topic:  Group Therapy:  Music and Mood  Participation Level:  Active   Description of Group: In this process group, members listened to a variety of genres of music and identified that different types of music evoke different responses.  Patients were encouraged to identify music that was soothing for them and music that was energizing for them.  Patients discussed how this knowledge can help with wellness and recovery in various ways including managing depression and anxiety as well as encouraging healthy sleep habits.    Therapeutic Goals: 1. Patients will explore the impact of different varieties of music on mood 2. Patients will verbalize the thoughts they have when listening to different types of music 3. Patients will identify music that is soothing to them as well as music that is energizing to them 4. Patients will discuss how to use this knowledge to assist in maintaining wellness and recovery 5. Patients will explore the use of music as a coping skill  Summary of Patient Progress:  At the beginning of group, patient expressed feeling angry at "the blonde nurse" but he then threw himself into group and participated fully with a lot of dancing and what appeared to be positive feelings.  He then left group and did not return, was heard yelling in the hall.  Therapeutic Modalities: Solution Focused Brief Therapy Motivational Interviewing Activity   Ambrose MantleMareida Grossman-Orr, LCSW 07/05/2017 12:21 PM

## 2017-07-05 NOTE — Progress Notes (Signed)
Patient ID: [name], male   DOB: [birth year], 41 y.o.   MRN: [id number] Staff reports patient became increasingly agitated, belligerent,cursing at staff, making inappropriate sexual comments, walking into nursing station, threatening to hurt/kill staff , attempting to spit on staff members . He did not respond to verbal de-escalation efforts , required acute management due  level of agitation.  Of note, 11/3 EKG NSR QTc 455  Received Geodon 20 mgrs IM x 1, Ativan 2 mgrs IM x 1. Currently in seclusion with constant observation by staff   Pulse Oximetry 97, pulse 113, Temp 98.5, pulse 115/63.   F Urijah Arko,MD

## 2017-07-05 NOTE — Progress Notes (Signed)
Patient verbalizes "Can you please change your bitch face. I am leaving here, I don't give a shit."

## 2017-07-05 NOTE — Progress Notes (Signed)
Loud and demanding, labile.  Lunch time patient poured soda on ground.  Continues to claim he sees "turtles crawling."    Requesting to go off unit saying "Let me off the unit, if you don't let me go I'll be like a fire under your ass.  You don't want that, do you?"  Patient also threated RN saying "I Continues to be unit restricted.  Patient coming to desk, interrupting staff, insulting staff, picking nose and flicking it at Administrative Coordinator. Redirected and RN spoke with patient in private patient stated "is that what you and that blonde nurse do? Play a game called restriction? Is that a sexual game?"  RN walked away.  Patient continues to be insulting and loud with staff.
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
## 2017-07-05 NOTE — Progress Notes (Signed)
Pt has been given injections earlier, pt has been sleep the duration of the evening, pt wakes up for vital signs but immediately goes back to sleep . Pt appeared irritable when asking questions, pt appeared to still be upset. Pt did not want to answer writers questions
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
## 2017-07-05 NOTE — Progress Notes (Signed)
Writer has observed patient up in the dayroom interacting with peers and several times had to be redirected due to his behavior. He has been very animated and angry about being moved to 500 hall and requested that he speak to a patient advocate. [facility] made aware of the situation and came and spoke with him concerning this matter. He was compliant with his medications and requested several prns and wanted narcotics for his broken foot. Patient has not been wearing his shoe for his foot and stated that he could put it on if we wanted him to but he would end up falling by wearing it. Writer suggested that since he has not been wearing it to not do so now so that he won't fall. Patient has been walking fine and writer has observed no unsteady gait or pain related limping. He requested flexeril and hydrocodone for pain. He received motrin for his pain. Support given and safety maintained on unit with 15 min checks.

## 2017-07-05 NOTE — Progress Notes (Signed)
Patient approached desk, demanding to be moved back to 300 hall.  "Once you move me back there won't be any problems."  Demanding an extra breakfast tray "Y'all aren't feeding me properly," RN informed patient that there aren't any extra trays and offered to provide a snack.  RN said he was here to help patient to which patient responded "if you were you would have gotten me a breakfast tray.  Y'all are being nasty to me."  Patient received PRN ativan and ibuprofen to which he said "thank you" then he proceeded to go to another RN and say about this RN "he isn't the brightest light in the drawer, he's pretty dim."  Then proceeded to say "You are going to get a whole lot of trouble from me if you don't move me."   As patient was walking away and threatened this RN stating "Yours is coming."  RN notified security, spoke with head of security, and spoke with NeurosurgeonAdministrative Coordinator.  NP notified.  Reported passive SI today but agrees to come to staff before acting on it.  States "I don't know what y'all are going to do about these turtles, walking on the wall.  I hear them too."

## 2017-07-06 LAB — RAPID URINE DRUG SCREEN, HOSP PERFORMED
Amphetamines: NOT DETECTED
BARBITURATES: NOT DETECTED
Benzodiazepines: NOT DETECTED
Cocaine: NOT DETECTED
Opiates: NOT DETECTED
Tetrahydrocannabinol: NOT DETECTED

## 2017-07-06 MED ORDER — LORAZEPAM 2 MG/ML IJ SOLN
INTRAMUSCULAR | Status: AC
  Administered 2017-07-06: 18:00:00
  Filled 2017-07-06: qty 1

## 2017-07-06 MED ORDER — OLANZAPINE 10 MG IM SOLR
10.0000 mg | Freq: Once | INTRAMUSCULAR | Status: DC
  Filled 2017-07-06: qty 10

## 2017-07-06 MED ORDER — HALOPERIDOL LACTATE 5 MG/ML IJ SOLN
INTRAMUSCULAR | Status: AC
  Filled 2017-07-06: qty 2

## 2017-07-06 MED ORDER — LORAZEPAM 2 MG/ML IJ SOLN
2.0000 mg | Freq: Once | INTRAMUSCULAR | Status: AC
  Administered 2017-07-06: 2 mg via INTRAMUSCULAR

## 2017-07-06 MED ORDER — BENZTROPINE MESYLATE 1 MG/ML IJ SOLN
INTRAMUSCULAR | Status: AC
  Filled 2017-07-06: qty 2

## 2017-07-06 MED ORDER — LORAZEPAM 1 MG PO TABS
2.0000 mg | ORAL_TABLET | Freq: Three times a day (TID) | ORAL | Status: DC | PRN
  Administered 2017-07-07 – 2017-07-14 (×14): 2 mg via ORAL
  Filled 2017-07-06 (×16): qty 2

## 2017-07-06 MED ORDER — DIPHENHYDRAMINE HCL 50 MG/ML IJ SOLN
50.0000 mg | Freq: Once | INTRAMUSCULAR | Status: AC
  Administered 2017-07-06: 50 mg via INTRAMUSCULAR
  Filled 2017-07-06: qty 1

## 2017-07-06 MED ORDER — BENZTROPINE MESYLATE 1 MG/ML IJ SOLN
1.0000 mg | Freq: Once | INTRAMUSCULAR | Status: AC
  Administered 2017-07-06: 1 mg via INTRAMUSCULAR
  Filled 2017-07-06: qty 1

## 2017-07-06 MED ORDER — HALOPERIDOL LACTATE 5 MG/ML IJ SOLN
10.0000 mg | Freq: Once | INTRAMUSCULAR | Status: AC
  Administered 2017-07-06: 10 mg via INTRAMUSCULAR
  Filled 2017-07-06: qty 2

## 2017-07-06 MED ORDER — OLANZAPINE 10 MG IM SOLR
INTRAMUSCULAR | Status: AC
  Filled 2017-07-06: qty 10

## 2017-07-06 MED ORDER — LORAZEPAM 2 MG/ML IJ SOLN
INTRAMUSCULAR | Status: AC
  Administered 2017-07-06: 2 mg
  Filled 2017-07-06: qty 1

## 2017-07-06 MED ORDER — DIPHENHYDRAMINE HCL 50 MG/ML IJ SOLN
INTRAMUSCULAR | Status: AC
  Filled 2017-07-06: qty 1

## 2017-07-06 MED ORDER — LORAZEPAM 1 MG PO TABS: 2.0000 mg | ORAL_TABLET | Freq: Once | ORAL | Status: AC

## 2017-07-06 MED ORDER — HALOPERIDOL 5 MG PO TABS
10.0000 mg | ORAL_TABLET | Freq: Once | ORAL | Status: AC
  Filled 2017-07-06: qty 2

## 2017-07-06 MED ORDER — DIPHENHYDRAMINE HCL 50 MG PO CAPS
50.0000 mg | ORAL_CAPSULE | Freq: Once | ORAL | Status: AC
  Filled 2017-07-06: qty 1

## 2017-07-06 NOTE — Progress Notes (Signed)
Pt fell asleep at the medication window with his coffee in his hand , still requesting "DRUGS"

## 2017-07-06 NOTE — Progress Notes (Signed)
[facility]  07/06/2017 1:07 PM Marcus [name]  MRN:  [id number] Subjective:   Marcus [name] is a 41 y/o M who was admitted with worsening depression, SI, and HI towards his boss. He has demonstrated ongoing mood lability, irritability, and disruptive behaviors over the weekend, which necessitated being moved from the 300 hall to the 500 hall. Pt was agitated and unable to be redirected, and he required IM geodon to control his behaviors after continuing to threaten nursing staff. Pt was spitting and required multiple staff to go hands-on to maintain safety.  Today, he is irritable, demanding, and difficult to redirect. He continues to threaten nursing staff saying that he will kill them after he is discharged. He threatened this provider stating, "I'll kill you too, doc." He continued to endorse HI towards his boss stating that his plan is obtain his boss's gun and use it on his boss. Pt would not share the name of his boss. Pt endorses AH and VH, but he does not describe them. Pt began to escalate during the interview, and he was not able to be redirected. Pt required IM geodon to address his agitation, and the interview was concluded.  Principal Problem: Bipolar 1 disorder, mixed, moderate (HCC) Diagnosis:   Patient Active Problem List   Diagnosis Date Noted  . Bipolar 1 disorder, mixed, moderate (HCC) [F31.62] 06/29/2017  . Polysubstance dependence including opioid type drug without complication, episodic abuse (HCC) [F19.20] 07/02/2016   Total Time spent with patient: 30 minutes  Past Psychiatric History: see H&P  Past Medical History:  Past Medical History:  Diagnosis Date  . Hepatitis C    History reviewed. No pertinent surgical history. Family History: History reviewed. No pertinent family history. Family Psychiatric  History: see H&P Social History:  Social History   Substance and Sexual Activity  Alcohol Use No     Social History   Substance and Sexual Activity  Drug  Use Yes  . Frequency: 7.0 times per week  . Types: Methamphetamines, Heroin   Comment: Daily use where possible    Social History   Socioeconomic History  . Marital status: Married    Spouse name: None  . Number of children: None  . Years of education: None  . Highest education level: None  Social Needs  . Financial resource strain: None  . Food insecurity - worry: None  . Food insecurity - inability: None  . Transportation needs - medical: None  . Transportation needs - non-medical: None  Occupational History  . None  Tobacco Use  . Smoking status: Current Every Day Smoker  . Smokeless tobacco: Never Used  Substance and Sexual Activity  . Alcohol use: No  . Drug use: Yes    Frequency: 7.0 times per week    Types: Methamphetamines, Heroin    Comment: Daily use where possible  . Sexual activity: Not Currently  Other Topics Concern  . None  Social History Narrative  . None   Additional Social History:                         Sleep: Good  Appetite:  Good  Current Medications: Current Facility-Administered Medications  Medication Dose Route Frequency Provider Last Rate Last Dose  . alum & mag hydroxide-simeth (MAALOX/MYLANTA) 200-200-20 MG/5ML suspension 30 mL  30 mL Oral Q4H PRN Oneta RackLewis, Tanika N, NP      . diphenhydrAMINE (BENADRYL) 50 MG/ML injection           .  feeding supplement (ENSURE ENLIVE) (ENSURE ENLIVE) liquid 237 mL  237 mL Oral BID BM Cobos, Rockey Situ, MD   237 mL at 07/06/17 0935  . gabapentin (NEURONTIN) capsule 400 mg  400 mg Oral TID Jolyne Loa T, MD   400 mg at 07/06/17 1255  . hydrOXYzine (ATARAX/VISTARIL) tablet 25 mg  25 mg Oral TID PRN Oneta Rack, NP   25 mg at 07/05/17 1529  . ibuprofen (ADVIL,MOTRIN) tablet 600 mg  600 mg Oral Q6H PRN Oneta Rack, NP   600 mg at 07/06/17 0934  . lithium carbonate (LITHOBID) CR tablet 600 mg  600 mg Oral Q12H Oneta Rack, NP   600 mg at 07/06/17 0811  . LORazepam (ATIVAN)  tablet 1 mg  1 mg Oral Q6H PRN Denzil Magnuson, NP   1 mg at 07/06/17 0508  . ziprasidone (GEODON) injection 20 mg  20 mg Intramuscular Q12H PRN Cobos, Rockey Situ, MD       And  . LORazepam (ATIVAN) tablet 1 mg  1 mg Oral PRN Cobos, Rockey Situ, MD      . magnesium hydroxide (MILK OF MAGNESIA) suspension 30 mL  30 mL Oral Daily PRN Oneta Rack, NP      . nicotine polacrilex (NICORETTE) gum 2 mg  2 mg Oral PRN Cobos, Rockey Situ, MD   2 mg at 07/06/17 0934  . QUEtiapine (SEROQUEL) tablet 200 mg  200 mg Oral QHS Oneta Rack, NP      . QUEtiapine (SEROQUEL) tablet 50 mg  50 mg Oral Q6H PRN Cobos, Rockey Situ, MD   50 mg at 07/06/17 0508  . traZODone (DESYREL) tablet 100 mg  100 mg Oral QHS PRN Micheal Likens, MD   100 mg at 07/04/17 2131    Lab Results:  Results for orders placed or performed during the hospital encounter of 06/29/17 (from the past 48 hour(s))  Urine rapid drug screen (hosp performed)     Status: None   Collection Time: 07/04/17  2:18 PM  Result Value Ref Range   Opiates NONE DETECTED NONE DETECTED   Cocaine NONE DETECTED NONE DETECTED   Benzodiazepines NONE DETECTED NONE DETECTED   Amphetamines NONE DETECTED NONE DETECTED   Tetrahydrocannabinol NONE DETECTED NONE DETECTED   Barbiturates NONE DETECTED NONE DETECTED    Comment:        DRUG SCREEN FOR MEDICAL PURPOSES ONLY.  IF CONFIRMATION IS NEEDED FOR ANY PURPOSE, NOTIFY LAB WITHIN 5 DAYS.        LOWEST DETECTABLE LIMITS FOR URINE DRUG SCREEN Drug Class       Cutoff (ng/mL) Amphetamine      1000 Barbiturate      200 Benzodiazepine   200 Tricyclics       300 Opiates          300 Cocaine          300 THC              50 Performed at [facility], [address] [place], [place] [address]   Lithium level     Status: Abnormal   Collection Time: 07/05/17  8:14 AM  Result Value Ref Range   Lithium Lvl 0.27 (L) 0.60 - 1.20 mmol/L    Comment: Performed at [facility], [address] [place], [place] [address]    Blood Alcohol level:  Lab Results  Component Value Date   [facility] <10 06/28/2017   ETH <5  01/29/2017    Metabolic Disorder Labs: Lab Results  Component Value Date   HGBA1C 5.0 07/06/2016   MPG 97 07/06/2016   Lab Results  Component Value Date   PROLACTIN 4.2 07/06/2016   Lab Results  Component Value Date   CHOL 162 07/06/2016   TRIG 82 07/06/2016   HDL 59 07/06/2016   CHOLHDL 2.7 07/06/2016   VLDL 16 07/06/2016   LDLCALC 87 07/06/2016    Physical Findings: AIMS: Facial and Oral Movements Muscles of Facial Expression: None, normal Lips and Perioral Area: None, normal Jaw: None, normal Tongue: None, normal,Extremity Movements Upper (arms, wrists, hands, fingers): None, normal Lower (legs, knees, ankles, toes): None, normal, Trunk Movements Neck, shoulders, hips: None, normal, Overall Severity Severity of abnormal movements (highest score from questions above): None, normal Incapacitation due to abnormal movements: None, normal Patient's awareness of abnormal movements (rate only patient's report): No Awareness, Dental Status Current problems with teeth and/or dentures?: No Does patient usually wear dentures?: No  CIWA:  CIWA-Ar Total: 1 COWS:  COWS Total Score: 1  Musculoskeletal: Strength & Muscle Tone: within normal limits Gait & Station: normal Patient leans: N/A  Psychiatric Specialty Exam: Physical Exam  Nursing note and vitals reviewed.   Review of Systems  Constitutional: Negative for chills and fever.  Respiratory: Negative for cough.   Gastrointestinal: Negative for heartburn and nausea.  Psychiatric/Behavioral: Positive for depression, hallucinations and suicidal ideas.    Blood pressure 104/77, pulse 79, temperature 98.5 F (36.9 C), temperature source Oral, resp. rate 20, height 5\' 11"  (1.803 m), weight 70.5 kg (155 lb 8 oz), SpO2 100 %.Body mass index is 21.69 kg/m.  General Appearance:  Casual  Eye Contact:  Fair  Speech:  Pressured  Volume:  Increased  Mood:  Dysphoric and Irritable  Affect:  Blunt, Congruent and Labile  Thought Process:  Goal Directed  Orientation:  Full (Time, Place, and Person)  Thought Content:  Hallucinations: Auditory Visual  Suicidal Thoughts:  Yes.  without intent/plan  Homicidal Thoughts:  Yes.  with intent/plan  Memory:  Immediate;   Good Recent;   Good Remote;   Good  Judgement:  Poor  Insight:  Lacking  Psychomotor Activity:  Normal  Concentration:  Concentration: Poor  Recall:  Fair  Fund of Knowledge:  Fair  Language:  Fair  Akathisia:  No  Handed:    AIMS (if indicated):     Assets:  Resilience  ADL's:  Intact  Cognition:  WNL  Sleep:  Number of Hours: 9     Treatment Plan Summary: Daily contact with patient to assess and evaluate symptoms and progress in treatment and Medication management. Pt has worsening mood/irritability and agitation, requiring frequent redirection and he received IM PRN medications to manage his worsening agitation. He will be continued on current regimen with PRN's to address agitation as needed and he will be placed on IVC. -Continue inpatient hospitalization     - Bipolar disorder    -Continue Lithium to 600 mgrs BID for mood disorder     -Continue Seroquel 50 mgrs Q 6 hours PRN for agitation      - Continue Ativan 1 mgr Q 6 hours PRN for agitation    -Continue Neurontin 400 mgrs TID for pain, anxiety      -Continue Seroquel to 200 mgrs QHS for mood disorder  - Pt will be placed on IVC due to receiving IM medications - Discharge planning will be ongoing   Micheal Likens, MD 07/06/2017, 1:07  PM

## 2017-07-06 NOTE — Progress Notes (Signed)
Patient threatening toward staff including MD, patient threatens "I am a danger to you." Patient continues to lunge toward staff, verbalizes "I want pain meds now!" Patient offered support and encouragement.

## 2017-07-06 NOTE — Progress Notes (Signed)
Dar Note: Patient continues to be irritable, agitated and verbally aggressive and abusive towards staff on the unit.  Patient constantly at the nursing station demanding and asking for narcotics.  Patient continues to call staff fools, bitches and threatening to punch male staff on the face.  Patient offered Motrin for pain, Ativan/Seroquel for agitation but patient refused.  Behaviors reported to dr Lucianne MussKumar.  Received verbal order for Ativan 2 mg, Cogentin 1 mg and Haldol 10 mg IM. Placed placed on restraint chair due to physical threat and agitation.

## 2017-07-06 NOTE — Progress Notes (Signed)
Pt got up stumbling and seemed a little lethargic wanting to know where the bathroom was, pt was redirected to his bathroom in his room. Pt came out after he was finished demanding his Seroquel, pt was stumbling and slurring his words and writer informed pt that that would not be a good idea now so pt was given his 600 mg Lithium he did not get earlier in the evening and pt was informed if he was better in an hour he could get his Seroquel.

## 2017-07-06 NOTE — Progress Notes (Signed)
Pt stated "I tried to get him to suck my dick" , standing at the nursing station. Pt was continually tried to be redirected, pt continued to try to agitate staff

## 2017-07-06 NOTE — Progress Notes (Signed)
Pt came out of his room and stated he needed drugs . Pt was asked to come out of the dayroom so they could clean it. Pt continues to slur his words , stumble when he walks insisting on " DRUGS"

## 2017-07-06 NOTE — Progress Notes (Signed)
D: Pt has been sleep during the shift   A:will continue to monitor 15 min checks done for safety  R: pt safe at this time

## 2017-07-06 NOTE — Progress Notes (Signed)
Recreation Therapy Notes  Date: 07/06/17 Time: 1015 Location: 500 Hall Dayroom  Group Topic: Leisure Education  Goal Area(s) Addresses:  Patient will identify positive leisure activities.  Patient will identify one positive benefit of participation in leisure activities.   Intervention: Holiday representativeConstruction paper, markers, glue sticks, scissors  Activity: Leisure Science writerBrochure.  Patients were to create a brochure that highlighted their leisure interests.  Patients were to also give a brief description of why they chose the activities they did.  Education:  Leisure Education, Building control surveyorDischarge Planning  Education Outcome: Acknowledges education/In group clarification offered/Needs additional education  Clinical Observations/Feedback: Pt did not attend group.    Caroll RancherMarjette Ireoluwa Grant, LRT/CTRS         Caroll RancherLindsay, Hamna Asa A 07/06/2017 12:10 PM

## 2017-07-06 NOTE — Progress Notes (Signed)
Pt got up stating he was having knee and foot pain, pt was offered Ibuprofen , pt said " that don't do SHIT, I need something stronger, I need some GOOD drugs" pt would not tell pt pain level therefore pt pain level could not be documented due to pt not appearing to be in any distress at this time

## 2017-07-06 NOTE — [facility] Group Notes (Signed)
LCSW Group Therapy Note   07/06/2017 1:15pm   Type of Therapy and Topic:  Group Therapy:  Overcoming Obstacles   Participation Level:  Did Not Attend   Description of Group:    In this group patients will be encouraged to explore what they see as obstacles to their own wellness and recovery. They will be guided to discuss their thoughts, feelings, and behaviors related to these obstacles. The group will process together ways to cope with barriers, with attention given to specific choices patients can make. Each patient will be challenged to identify changes they are motivated to make in order to overcome their obstacles. This group will be process-oriented, with patients participating in exploration of their own experiences as well as giving and receiving support and challenge from other group members.   Therapeutic Goals: 1. Patient will identify personal and current obstacles as they relate to admission. 2. Patient will identify barriers that currently interfere with their wellness or overcoming obstacles.  3. Patient will identify feelings, thought process and behaviors related to these barriers. 4. Patient will identify two changes they are willing to make to overcome these obstacles:      Summary of Patient Progress      Therapeutic Modalities:   Cognitive Behavioral Therapy Solution Focused Therapy Motivational Interviewing Relapse Prevention Therapy  Ida RogueRodney B Georgena Weisheit, LCSW 07/06/2017 2:18 PM

## 2017-07-06 NOTE — Progress Notes (Signed)
Pt presents with med seeking behaviors, pt appeared to barely be able too hold his head up and could not keep his eyes open , but continued to ask for "DRUGS"

## 2017-07-07 DIAGNOSIS — F1910 Other psychoactive substance abuse, uncomplicated: Secondary | ICD-10-CM

## 2017-07-07 LAB — BASIC METABOLIC PANEL
ANION GAP: 9 (ref 5–15)
BUN: 22 mg/dL — ABNORMAL HIGH (ref 6–20)
CALCIUM: 9.2 mg/dL (ref 8.9–10.3)
CHLORIDE: 102 mmol/L (ref 101–111)
CO2: 29 mmol/L (ref 22–32)
Creatinine, Ser: 0.94 mg/dL (ref 0.61–1.24)
GFR calc Af Amer: >60 mL/min (ref >60)
GLUCOSE: 89 mg/dL (ref 65–99)
POTASSIUM: 4 mmol/L (ref 3.5–5.1)
Sodium: 140 mmol/L (ref 135–145)

## 2017-07-07 LAB — CBC WITH DIFFERENTIAL/PLATELET
BASOS ABS: 0 10*3/uL (ref 0.0–0.1)
Basophils Relative: 0 %
Eosinophils Absolute: 0.3 10*3/uL (ref 0.0–0.7)
Eosinophils Relative: 3 %
HEMATOCRIT: 39.8 % (ref 39.0–52.0)
HEMOGLOBIN: 13.1 g/dL (ref 13.0–17.0)
LYMPHS PCT: 39 %
Lymphs Abs: 3.3 10*3/uL (ref 0.7–4.0)
MCH: 30.8 pg (ref 26.0–34.0)
MCHC: 32.9 g/dL (ref 30.0–36.0)
MCV: 93.6 fL (ref 78.0–100.0)
Monocytes Absolute: 0.5 10*3/uL (ref 0.1–1.0)
Monocytes Relative: 5 %
NEUTROS ABS: 4.5 10*3/uL (ref 1.7–7.7)
Neutrophils Relative %: 53 %
Platelets: 266 10*3/uL (ref 150–400)
RBC: 4.25 MIL/uL (ref 4.22–5.81)
RDW: 12.7 % (ref 11.5–15.5)
WBC: 8.6 10*3/uL (ref 4.0–10.5)

## 2017-07-07 LAB — CK: Total CK: 1125 U/L — ABNORMAL HIGH (ref 49–397)

## 2017-07-07 NOTE — Progress Notes (Signed)
Pt did not attend group, pt is sleeping.

## 2017-07-07 NOTE — Progress Notes (Signed)
Recreation Therapy Notes  Date: 07/07/17 Time: 0940 Location: 500 Hall Dayroom  Group Topic: Self-Esteem  Goal Area(s) Addresses:  Patient will successfully identify positive attributes about themselves.  Patient will successfully identify benefit of improved self-esteem.   Behavioral Response: None  Intervention: Worksheet  Activity: Self-Exploration.  Patients were given worksheets with eleven sentences in which they had to fill in the blank to complete.  Patients were to identify things such as who they admire, how they feel, what they are struggling with, what makes them proud of themselves, etc.  Education:  Self-Esteem, Discharge Planning.   Education Outcome: Acknowledges education/In group clarification offered/Needs additional education  Clinical Observations/Feedback: Pt arrived late, came in the dayroom and went to sleep.   Caroll RancherMarjette Kelon Easom, LRT/CTRS         Caroll RancherLindsay, Jules Baty A 07/07/2017 11:44 AM

## 2017-07-07 NOTE — Progress Notes (Signed)
Pt came to the med window demanding medication, pt was given 2 mg Ativan 25 mg [facility]

## 2017-07-07 NOTE — Progress Notes (Signed)
Patient has been isolative to room this shift, resting for the majority of the shift. Patient has exhibited no aggressive or assaultive behaviors toward staff or peers. Patient has been compliant with medications.   Assess patient for safety, offer medications as prescribed, engage patient in 1:1 staff talks.   Continue to monitor as planned. Patient able to contract for safety   

## 2017-07-07 NOTE — [facility] Group Notes (Signed)
LCSW Group Therapy Note  07/07/2017 1:15pm  Type of Therapy/Topic:  Group Therapy:  Feelings about Diagnosis  Participation Level:  Did Not Attend   Description of Group:   This group will allow patients to explore their thoughts and feelings about diagnoses they have received. Patients will be guided to explore their level of understanding and acceptance of these diagnoses. Facilitator will encourage patients to process their thoughts and feelings about the reactions of others to their diagnosis and will guide patients in identifying ways to discuss their diagnosis with significant others in their lives. This group will be process-oriented, with patients participating in exploration of their own experiences, giving and receiving support, and processing challenge from other group members.   Therapeutic Goals: 1. Patient will demonstrate understanding of diagnosis as evidenced by identifying two or more symptoms of the disorder 2. Patient will be able to express two feelings regarding the diagnosis 3. Patient will demonstrate their ability to communicate their needs through discussion and/or role play  Summary of Patient Progress:       Therapeutic Modalities:   Cognitive Behavioral Therapy Brief Therapy Feelings Identification    Ida RogueRodney B Katonya Blecher, LCSW 07/07/2017 1:59 PM

## 2017-07-07 NOTE — Progress Notes (Signed)
Pt up at the nursing station cursing at staff , threatening "you're a fucking BITCH YOU FUCKING PRICK" , pt was informed that he would not be able to stand at the nursing station, pt began getting loud Glass blower/designercursing writer. Pt began demanding for more medication, pt demanded to see the doctor

## 2017-07-07 NOTE — Progress Notes (Signed)
[facility] MD Progress Note  07/07/2017 1:43 PM Marcus [name]  MRN:  [id number] Subjective:   Marcus [name] is a 41 y/o M who was admitted with worsening agitation, SI, HI, and anxiety. He has been having episodes of agitation which resulted in transfer from 300 unit to 500 unit. Last night he had an episode of agitation and threatening staff, and he received IM haldol 28m and ativan 274m Today upon interview, pt is significantly drowsy and he is lying in bed. Pt was met with SW team present. Pt reports that he is feeling drowsy today from the medications he received overnight. He continues to endorse SI stating he has them "always." He endorses HI towards his boss and he states, "I want to kill the mother fucker." He endorses both AH and VH but he cannot describe them. Discussed with patient about treatment options, and pt expressed that he was still interested in seeking substance use treatment for after discharge. Discussed with patient that he would need to control his agitation and threatening behaviors prior to being ready for discharge. Pt verbalized good understanding. Discussed with patient that we would continue his current regimen without changes as he has been receiving multiple PRN's for agitation in the past 3 days. Pt had no further questions, comments, or concerns.  Principal Problem: Bipolar 1 disorder, mixed, moderate (HC[place]looDiagnosis:   Patient Active Problem List   Diagnosis Date Noted  . Bipolar 1 disorder, mixed, moderate (HCAllison[F31.62] 06/29/2017  . Polysubstance dependence including opioid type drug without complication, episodic abuse (HCK[place][F19.20] 07/02/2016   Total Time spent with patient: 30 minutes  Past Psychiatric History: see H&P  Past Medical History:  Past Medical History:  Diagnosis Date  . Hepatitis C    History reviewed. No pertinent surgical history. Family History: History reviewed. No pertinent family history. Family Psychiatric  History: see H&P Social History:   Social History   Substance and Sexual Activity  Alcohol Use No     Social History   Substance and Sexual Activity  Drug Use Yes  . Frequency: 7.0 times per week  . Types: Methamphetamines, Heroin   Comment: Daily use where possible    Social History   Socioeconomic History  . Marital status: Married    Spouse name: None  . Number of children: None  . Years of education: None  . Highest education level: None  Social Needs  . Financial resource strain: None  . Food insecurity - worry: None  . Food insecurity - inability: None  . Transportation needs - medical: None  . Transportation needs - non-medical: None  Occupational History  . None  Tobacco Use  . Smoking status: Current Every Day Smoker  . Smokeless tobacco: Never Used  Substance and Sexual Activity  . Alcohol use: No  . Drug use: Yes    Frequency: 7.0 times per week    Types: Methamphetamines, Heroin    Comment: Daily use where possible  . Sexual activity: Not Currently  Other Topics Concern  . None  Social History Narrative  . None   Additional Social History:                         Sleep: Good  Appetite:  Fair  Current Medications: Current Facility-Administered Medications  Medication Dose Route Frequency Provider Last Rate Last Dose  . alum & mag hydroxide-simeth (MAALOX/MYLANTA) 200-200-20 MG/5ML suspension 30 mL  30 mL Oral Q4H PRN LeDerrill CenterNP      .  feeding supplement (ENSURE ENLIVE) (ENSURE ENLIVE) liquid 237 mL  237 mL Oral BID BM Cobos, Myer Peer, MD   237 mL at 07/07/17 1024  . gabapentin (NEURONTIN) capsule 400 mg  400 mg Oral TID Maris Berger T, MD   400 mg at 07/07/17 0829  . hydrOXYzine (ATARAX/VISTARIL) tablet 25 mg  25 mg Oral TID PRN Derrill Center, NP   25 mg at 07/07/17 0634  . ibuprofen (ADVIL,MOTRIN) tablet 600 mg  600 mg Oral Q6H PRN Derrill Center, NP   600 mg at 07/07/17 0049  . lithium carbonate (LITHOBID) CR tablet 600 mg  600 mg Oral Q12H  Derrill Center, NP   600 mg at 07/07/17 0829  . LORazepam (ATIVAN) tablet 2 mg  2 mg Oral Q8H PRN Cobos, Myer Peer, MD   2 mg at 07/07/17 0634  . magnesium hydroxide (MILK OF MAGNESIA) suspension 30 mL  30 mL Oral Daily PRN Derrill Center, NP      . nicotine polacrilex (NICORETTE) gum 2 mg  2 mg Oral PRN Cobos, Myer Peer, MD   2 mg at 07/06/17 1447  . QUEtiapine (SEROQUEL) tablet 200 mg  200 mg Oral QHS Derrill Center, NP   200 mg at 07/07/17 0047  . QUEtiapine (SEROQUEL) tablet 50 mg  50 mg Oral Q6H PRN Cobos, Myer Peer, MD   50 mg at 07/07/17 0829  . traZODone (DESYREL) tablet 100 mg  100 mg Oral QHS PRN Pennelope Bracken, MD   100 mg at 07/04/17 2131  . ziprasidone (GEODON) injection 20 mg  20 mg Intramuscular Q12H PRN Cobos, Myer Peer, MD        Lab Results:  Results for orders placed or performed during the hospital encounter of 06/29/17 (from the past 48 hour(s))  CBC with Differential/Platelet     Status: None   Collection Time: 07/07/17  6:55 AM  Result Value Ref Range   WBC 8.6 4.0 - 10.5 K/uL   RBC 4.25 4.22 - 5.81 MIL/uL   Hemoglobin 13.1 13.0 - 17.0 g/dL   HCT 39.8 39.0 - 52.0 %   MCV 93.6 78.0 - 100.0 fL   MCH 30.8 26.0 - 34.0 pg   MCHC 32.9 30.0 - 36.0 g/dL   RDW 12.7 11.5 - 15.5 %   Platelets 266 150 - 400 K/uL   Neutrophils Relative % 53 %   Neutro Abs 4.5 1.7 - 7.7 K/uL   Lymphocytes Relative 39 %   Lymphs Abs 3.3 0.7 - 4.0 K/uL   Monocytes Relative 5 %   Monocytes Absolute 0.5 0.1 - 1.0 K/uL   Eosinophils Relative 3 %   Eosinophils Absolute 0.3 0.0 - 0.7 K/uL   Basophils Relative 0 %   Basophils Absolute 0.0 0.0 - 0.1 K/uL    Comment: Performed at [facility], [place] [address] [place], [place] [address]  Basic metabolic panel     Status: Abnormal   Collection Time: 07/07/17  6:55 AM  Result Value Ref Range   Sodium 140 135 - 145 mmol/L   Potassium 4.0 3.5 - 5.1 mmol/L   Chloride 102 101 - 111 mmol/L   CO2 29 22 - 32 mmol/L    Glucose, Bld 89 65 - 99 mg/dL   BUN 22 (H) 6 - 20 mg/dL   Creatinine, Ser 0.94 0.61 - 1.24 mg/dL   Calcium 9.2 8.9 - 10.3 mg/dL   GFR calc non Af Amer >60 >60 mL/min  GFR calc Af Amer >60 >60 mL/min    Comment: (NOTE) The eGFR has been calculated using the CKD EPI equation. This calculation has not been validated in all clinical situations. eGFR's persistently <60 mL/min signify possible Chronic Kidney Disease.    Anion gap 9 5 - 15    Comment: Performed at [facility], [place] [address], [place], [place] [address]  CK     Status: Abnormal   Collection Time: 07/07/17  6:55 AM  Result Value Ref Range   Total CK 1,125 (H) 49 - 397 U/L    Comment: Performed at [facility], [place] [address] [place], [place] [address]    Blood Alcohol level:  Lab Results  Component Value Date   ETH <10 06/28/2017   ETH <5 47/42/5956    Metabolic Disorder Labs: Lab Results  Component Value Date   HGBA1C 5.0 07/06/2016   MPG 97 07/06/2016   Lab Results  Component Value Date   PROLACTIN 4.2 07/06/2016   Lab Results  Component Value Date   CHOL 162 07/06/2016   TRIG 82 07/06/2016   HDL 59 07/06/2016   CHOLHDL 2.7 07/06/2016   VLDL 16 07/06/2016   LDLCALC 87 07/06/2016    Physical Findings: AIMS: Facial and Oral Movements Muscles of Facial Expression: None, normal Lips and Perioral Area: None, normal Jaw: None, normal Tongue: None, normal,Extremity Movements Upper (arms, wrists, hands, fingers): None, normal Lower (legs, knees, ankles, toes): None, normal, Trunk Movements Neck, shoulders, hips: None, normal, Overall Severity Severity of abnormal movements (highest score from questions above): None, normal Incapacitation due to abnormal movements: None, normal Patient's awareness of abnormal movements (rate only patient's report): No Awareness, Dental Status Current problems with teeth and/or dentures?: No Does patient usually wear dentures?: No   CIWA:  CIWA-Ar Total: 1 COWS:  COWS Total Score: 1  Musculoskeletal: Strength & Muscle Tone: within normal limits Gait & Station: normal Patient leans: N/A  Psychiatric Specialty Exam: Physical Exam  Nursing note and vitals reviewed.   Review of Systems  Constitutional: Positive for malaise/fatigue. Negative for chills and fever.  Respiratory: Negative for cough and shortness of breath.   Cardiovascular: Negative for chest pain.  Gastrointestinal: Negative for abdominal pain, heartburn, nausea and vomiting.  Psychiatric/Behavioral: Positive for depression, hallucinations and suicidal ideas.    Blood pressure 115/60, pulse 81, temperature (!) 97 F (36.1 C), resp. rate 16, height _0  (1.803 m), weight 70.5 kg (155 lb 8 oz), SpO2 100 %.Body mass index is 21.69 kg/m.  General Appearance: Casual  Eye Contact:  Fair  Speech:  Slow  Volume:  Decreased  Mood:  Anxious and Depressed  Affect:  Congruent and Constricted  Thought Process:  Coherent and Goal Directed  Orientation:  Full (Time, Place, and Person)  Thought Content:  Hallucinations: Auditory Visual  Suicidal Thoughts:  Yes.  without intent/plan  Homicidal Thoughts:  Yes.  with intent/plan  Memory:  Immediate;   Good Recent;   Good Remote;   Good  Judgement:  Poor  Insight:  Lacking  Psychomotor Activity:  Normal  Concentration:  Concentration: Fair  Recall:  [employer] of Knowledge:  Fair  Language:  Fair  Akathisia:  No  Handed:    AIMS (if indicated):     Assets:  Resilience  ADL's:  Intact  Cognition:  WNL  Sleep:  Number of Hours: 6     Treatment Plan Summary: Daily contact with patient to assess and evaluate symptoms  and progress in treatment and Medication management. Pt continues to have episodes of agitation by threatening staff, and last night he received IM haldol and ativan to address his symptoms. Today, he is drowsy from PRN's overnight. Discussed with patient about maintaining his behaviors  as safe as a step towards being appropriate for discharge, and pt verbalized understanding. -Continue inpatient hospitalization                                     - Bipolar disorder                         -Continue Lithium to 600 mgrs BID for mood disorder                          -Continue Seroquel 50 mgrs Q 6 hours PRN for agitation                                     - Continue Ativan 1 mgr Q 6 hours PRN for agitation                         -Continue Neurontin 400 mgrs TID for pain, anxiety                                      -Continue Seroquel to 200 mgrs QHS for mood disorder  - Encourage participation in groups and the therapeutic milieu - Discharge planning will be ongoing   Pennelope Bracken, MD 07/07/2017, 1:43 PM

## 2017-07-07 NOTE — Progress Notes (Signed)
D   Pt has slurred speech and was posturing toward a male MHT when the MHT kindly asked him to go to his room since it was 11 pm and time for the dayroom to close down   Pt is irritable and disrespectful toward others and does not want to follow the rules   He received medication and snacks this evening and he still asked for more medication like narcotic pain meds A   Verbal support given   Medications administered and effectiveness monitored   Q 15 min checks  Attempt to redirect and set limits R   Pt is safe at present time

## 2017-07-08 MED ORDER — ACETAMINOPHEN 325 MG PO TABS
650.0000 mg | ORAL_TABLET | Freq: Four times a day (QID) | ORAL | Status: DC | PRN
  Administered 2017-07-08 – 2017-07-14 (×8): 650 mg via ORAL
  Filled 2017-07-08 (×8): qty 2

## 2017-07-08 MED ORDER — QUETIAPINE FUMARATE 50 MG PO TABS
50.0000 mg | ORAL_TABLET | Freq: Once | ORAL | Status: AC
  Administered 2017-07-08: 50 mg via ORAL
  Filled 2017-07-08 (×2): qty 1

## 2017-07-08 NOTE — Progress Notes (Signed)
Patient ID: Lorrene ReidDana Verbeek, male   DOB: [birth year], 41 y.o.   MRN: [id number]  D: Patient hanging around nurses station being intrusive. Pt speech is slurred and appears dazed but continue to ask for all medications that can be given. Pt denies SI/HI/AVH. Cooperative with assessment. No acute distressed noted at this time.   A: Medications administered as prescribed. Support and encouragement offered as needed. Pt encouraged to discuss feelings and come to staff with any question or concerns.   R: Patient remains safe on the unit.

## 2017-07-08 NOTE — Progress Notes (Signed)
Patient has been isolative to room this shift, resting for the majority of the shift. Patient has exhibited no aggressive or assaultive behaviors toward staff or peers. Patient has been compliant with medications.   Assess patient for safety, offer medications as prescribed, engage patient in 1:1 staff talks.   Continue to monitor as planned. Patient able to contract for safety   

## 2017-07-08 NOTE — [facility] Group Notes (Signed)
LCSW Group Therapy Note   07/08/2017 1:15pm   Type of Therapy and Topic:  Group Therapy:  Trust and Honesty  Participation Level:  Active  Description of Group:    In this group patients will be asked to explore the value of being honest.  Patients will be guided to discuss their thoughts, feelings, and behaviors related to honesty and trusting in others. Patients will process together how trust and honesty relate to forming relationships with peers, family members, and self. Each patient will be challenged to identify and express feelings of being vulnerable. Patients will discuss reasons why people are dishonest and identify alternative outcomes if one was truthful (to self or others). This group will be process-oriented, with patients participating in exploration of their own experiences, giving and receiving support, and processing challenge from other group members.   Therapeutic Goals: 1. Patient will identify why honesty is important to relationships and how honesty overall affects relationships.  2. Patient will identify a situation where they lied or were lied too and the  feelings, thought process, and behaviors surrounding the situation 3. Patient will identify the meaning of being vulnerable, how that feels, and how that correlates to being honest with self and others. 4. Patient will identify situations where they could have told the truth, but instead lied and explain reasons of dishonesty.   Summary of Patient Progress  "I only get in trouble when I am honmest, so I avoid it all costs.  Went on to tell a story of being dishonest with the cops but chose to take 2 days in jail rather than "ratting someone else out." [facility]ed about [name] and how he would prefer to be there "because they give me everything I need there, including Suboxone and Xanax."  When confronted about his ambivalence of addressing his addiction, he was unwilling to take responsibility.  Rather, wanted to talk  about a situation in an [name] in which he felt singled out and treated unfairly.  Was not angry nor threatening.  Therapeutic Modalities:   Cognitive Behavioral Therapy Solution Focused Therapy Motivational Interviewing Brief Therapy  [name], LCSW 07/08/2017 1:32 PM

## 2017-07-08 NOTE — Progress Notes (Signed)
[facility] MD Progress Note  07/08/2017 3:35 PM Marcus [name]  MRN:  [id number] Subjective:   Marcus [name] is a 41 y/o M who was admitted with worsening depression, SI, HI, substance use, and anxiety. He had increasingly agitated behavior after admission and he was transferred to [address] 500. Today upon evaluation, pt is drowsy and calm. He reports that his mood is "shit." He continues to endorse SI without specific plan stating, "I just want to be done." He continues to endorse HI towards his boss with specific plan to obtain his boss's gun, but pt will not reveal more information about his boss. Pt endorses AH of "voices" but he does not describe them further. He also endorses VH of seeing "shapes." Pt has been sleeping well and his appetite has been good. Attempted to engage with patient about working towards resolving some of his SI/HI and encouraged pt that he should attempt to evaluate with assistance from the treatment team methods which would allow him to maintain his own safety and the safety of others both inside the hospital and outside the hospital. When discussing that topic, pt states, "I don't think I'm safe right now." Encouraged pt to engage in the therapeutic milieu as well as to treat all staff members with dignity and respect, and pt stated he would try. Discussed with patient that due to multiple PRN medications he has received over past 2 days, that we will not make any medication adjustments today, and pt was in agreement.  He had no further questions, comments, or concerns.   Principal Problem: Bipolar 1 disorder, mixed, moderate ([place]) Diagnosis:   Patient Active Problem List   Diagnosis Date Noted  . Bipolar 1 disorder, mixed, moderate ([place]) [F31.62] 06/29/2017  . Polysubstance dependence including opioid type drug without complication, episodic abuse ([place]) [F19.20] 07/02/2016   Total Time spent with patient: 30 minutes  Past Psychiatric History: see H&P  Past Medical History:  Past  Medical History:  Diagnosis Date  . Hepatitis C    History reviewed. No pertinent surgical history. Family History: History reviewed. No pertinent family history. Family Psychiatric  History: see H&P Social History:  Social History   Substance and Sexual Activity  Alcohol Use No     Social History   Substance and Sexual Activity  Drug Use Yes  . Frequency: 7.0 times per week  . Types: Methamphetamines, Heroin   Comment: Daily use where possible    Social History   Socioeconomic History  . Marital status: Married    Spouse name: None  . Number of children: None  . Years of education: None  . Highest education level: None  Social Needs  . Financial resource strain: None  . Food insecurity - worry: None  . Food insecurity - inability: None  . Transportation needs - medical: None  . Transportation needs - non-medical: None  Occupational History  . None  Tobacco Use  . Smoking status: Current Every Day Smoker  . Smokeless tobacco: Never Used  Substance and Sexual Activity  . Alcohol use: No  . Drug use: Yes    Frequency: 7.0 times per week    Types: Methamphetamines, Heroin    Comment: Daily use where possible  . Sexual activity: Not Currently  Other Topics Concern  . None  Social History Narrative  . None   Additional Social History:  Sleep: Fair  Appetite:  Good  Current Medications: Current Facility-Administered Medications  Medication Dose Route Frequency Provider Last Rate Last Dose  . alum & mag hydroxide-simeth (MAALOX/MYLANTA) 200-200-20 MG/5ML suspension 30 mL  30 mL Oral Q4H PRN Derrill Center, NP      . feeding supplement (ENSURE ENLIVE) (ENSURE ENLIVE) liquid 237 mL  237 mL Oral BID BM Cobos, Myer Peer, MD   237 mL at 07/08/17 0810  . gabapentin (NEURONTIN) capsule 400 mg  400 mg Oral TID Maris Berger T, MD   400 mg at 07/08/17 1316  . hydrOXYzine (ATARAX/VISTARIL) tablet 25 mg  25 mg Oral TID PRN  Derrill Center, NP   25 mg at 07/08/17 0809  . ibuprofen (ADVIL,MOTRIN) tablet 600 mg  600 mg Oral Q6H PRN Derrill Center, NP   600 mg at 07/08/17 0809  . lithium carbonate (LITHOBID) CR tablet 600 mg  600 mg Oral Q12H Derrill Center, NP   600 mg at 07/08/17 0809  . LORazepam (ATIVAN) tablet 2 mg  2 mg Oral Q8H PRN Cobos, Myer Peer, MD   2 mg at 07/08/17 0809  . magnesium hydroxide (MILK OF MAGNESIA) suspension 30 mL  30 mL Oral Daily PRN Derrill Center, NP      . nicotine polacrilex (NICORETTE) gum 2 mg  2 mg Oral PRN Cobos, Myer Peer, MD   2 mg at 07/08/17 1315  . QUEtiapine (SEROQUEL) tablet 200 mg  200 mg Oral QHS Derrill Center, NP   200 mg at 07/07/17 2220  . QUEtiapine (SEROQUEL) tablet 50 mg  50 mg Oral Q6H PRN Cobos, Myer Peer, MD   50 mg at 07/08/17 1315  . traZODone (DESYREL) tablet 100 mg  100 mg Oral QHS PRN Pennelope Bracken, MD   100 mg at 07/07/17 2220  . ziprasidone (GEODON) injection 20 mg  20 mg Intramuscular Q12H PRN Cobos, Myer Peer, MD        Lab Results:  Results for orders placed or performed during the hospital encounter of 06/29/17 (from the past 48 hour(s))  CBC with Differential/Platelet     Status: None   Collection Time: 07/07/17  6:55 AM  Result Value Ref Range   WBC 8.6 4.0 - 10.5 K/uL   RBC 4.25 4.22 - 5.81 MIL/uL   Hemoglobin 13.1 13.0 - 17.0 g/dL   HCT 39.8 39.0 - 52.0 %   MCV 93.6 78.0 - 100.0 fL   MCH 30.8 26.0 - 34.0 pg   MCHC 32.9 30.0 - 36.0 g/dL   RDW 12.7 11.5 - 15.5 %   Platelets 266 150 - 400 K/uL   Neutrophils Relative % 53 %   Neutro Abs 4.5 1.7 - 7.7 K/uL   Lymphocytes Relative 39 %   Lymphs Abs 3.3 0.7 - 4.0 K/uL   Monocytes Relative 5 %   Monocytes Absolute 0.5 0.1 - 1.0 K/uL   Eosinophils Relative 3 %   Eosinophils Absolute 0.3 0.0 - 0.7 K/uL   Basophils Relative 0 %   Basophils Absolute 0.0 0.0 - 0.1 K/uL    Comment: Performed at [facility], [place] [address], [place], [place] [address]  Basic  metabolic panel     Status: Abnormal   Collection Time: 07/07/17  6:55 AM  Result Value Ref Range   Sodium 140 135 - 145 mmol/L   Potassium 4.0 3.5 - 5.1 mmol/L   Chloride 102 101 - 111 mmol/L   CO2 29  22 - 32 mmol/L   Glucose, Bld 89 65 - 99 mg/dL   BUN 22 (H) 6 - 20 mg/dL   Creatinine, Ser 0.94 0.61 - 1.24 mg/dL   Calcium 9.2 8.9 - 10.3 mg/dL   GFR calc non Af Amer >60 >60 mL/min   GFR calc Af Amer >60 >60 mL/min    Comment: (NOTE) The eGFR has been calculated using the CKD EPI equation. This calculation has not been validated in all clinical situations. eGFR's persistently <60 mL/min signify possible Chronic Kidney Disease.    Anion gap 9 5 - 15    Comment: Performed at [facility], [place] [address] [place], [place] [address]  CK     Status: Abnormal   Collection Time: 07/07/17  6:55 AM  Result Value Ref Range   Total CK 1,125 (H) 49 - 397 U/L    Comment: Performed at [facility], [place] [address], [place], [place] [address]    Blood Alcohol level:  Lab Results  Component Value Date   ETH <10 06/28/2017   ETH <5 09/09/3233    Metabolic Disorder Labs: Lab Results  Component Value Date   HGBA1C 5.0 07/06/2016   MPG 97 07/06/2016   Lab Results  Component Value Date   PROLACTIN 4.2 07/06/2016   Lab Results  Component Value Date   CHOL 162 07/06/2016   TRIG 82 07/06/2016   HDL 59 07/06/2016   CHOLHDL 2.7 07/06/2016   VLDL 16 07/06/2016   LDLCALC 87 07/06/2016    Physical Findings: AIMS: Facial and Oral Movements Muscles of Facial Expression: None, normal Lips and Perioral Area: None, normal Jaw: None, normal Tongue: None, normal,Extremity Movements Upper (arms, wrists, hands, fingers): None, normal Lower (legs, knees, ankles, toes): None, normal, Trunk Movements Neck, shoulders, hips: None, normal, Overall Severity Severity of abnormal movements (highest score from questions above): None, normal Incapacitation due to  abnormal movements: None, normal Patient's awareness of abnormal movements (rate only patient's report): No Awareness, Dental Status Current problems with teeth and/or dentures?: No Does patient usually wear dentures?: No  CIWA:  CIWA-Ar Total: 1 COWS:  COWS Total Score: 1  Musculoskeletal: Strength & Muscle Tone: within normal limits Gait & Station: normal Patient leans: N/A  Psychiatric Specialty Exam: Physical Exam  Nursing note and vitals reviewed.   Review of Systems  Constitutional: Negative for chills and fever.  Cardiovascular: Negative for chest pain and palpitations.  Gastrointestinal: Negative for abdominal pain, heartburn, nausea and vomiting.  Musculoskeletal: Positive for joint pain.  Psychiatric/Behavioral: Positive for depression, hallucinations and suicidal ideas. The patient is nervous/anxious.     Blood pressure 115/60, pulse 81, temperature (!) 97 F (36.1 C), resp. rate 16, height 5' 11"  (1.803 m), weight 70.5 kg (155 lb 8 oz), SpO2 100 %.Body mass index is 21.69 kg/m.  General Appearance: Casual and Disheveled  Eye Contact:  Fair  Speech:  Clear and Coherent  Volume:  Normal  Mood:  Anxious and Irritable  Affect:  Congruent and Constricted  Thought Process:  Coherent and Goal Directed  Orientation:  Full (Time, Place, and Person)  Thought Content:  Hallucinations: Auditory Visual and Paranoid Ideation  Suicidal Thoughts:  Yes.  without intent/plan  Homicidal Thoughts:  Yes.  with intent/plan  Memory:  Immediate;   Good Recent;   Good Remote;   Good  Judgement:  Impaired  Insight:  Lacking  Psychomotor Activity:  Normal  Concentration:  Concentration: Good  Recall:  Good  Fund  of Knowledge:  Good  Language:  Good  Akathisia:  No  Handed:    AIMS (if indicated):     Assets:  Physical Health Resilience Social Support  ADL's:  Intact  Cognition:  WNL  Sleep:  Number of Hours: 6     Treatment Plan Summary: Daily contact with patient to  assess and evaluate symptoms and progress in treatment and Medication management. Pt continues to have agitation, irritability, SI, and HI, but he is showing some minor improvement in episodes of agitation as he is more calm today. He agrees to continue same regimen without changes at this time as he has some ongoing drowsiness.  -Continue inpatient hospitalization - Bipolar disorder -ContinueLithium to 600 mgrs BID for mood disorder  -ContinueSeroquel 50 mgrs Q 6 hours PRN for agitation - Continue Ativan 1 mgr Q 6 hours PRN for agitation -Continue Neurontin 400 mgrs TID for pain, anxiety  -ContinueSeroquel to 200 mgrs QHS for mood disorder  - Encourage participation in groups and the therapeutic milieu - Discharge planning will be ongoing  Pennelope Bracken, MD 07/08/2017, 3:35 PM

## 2017-07-08 NOTE — Tx Team (Signed)
Interdisciplinary Treatment and Diagnostic Plan Update 07/08/2017 Time of Session: 9:30am  Marcus [name]  MRN: [id number]  Principal Diagnosis: Bipolar 1 disorder, mixed, moderate (H[facility])  Secondary Diagnoses: Principal Problem:   Bipolar 1 disorder, mixed, moderate (H[facility])   Current Medications:  Current Facility-Administered Medications  Medication Dose Route Frequency Provider Last Rate Last Dose  . alum & mag hydroxide-simeth (MAALOX/MYLANTA) 200-200-20 MG/5ML suspension 30 mL  30 mL Oral Q4H PRN Oneta Rack, NP      . feeding supplement (ENSURE ENLIVE) (ENSURE ENLIVE) liquid 237 mL  237 mL Oral BID BM Cobos, Rockey Situ, MD   237 mL at 07/08/17 0810  . gabapentin (NEURONTIN) capsule 400 mg  400 mg Oral TID Micheal Likens, MD   400 mg at 07/08/17 0809  . hydrOXYzine (ATARAX/VISTARIL) tablet 25 mg  25 mg Oral TID PRN Oneta Rack, NP   25 mg at 07/08/17 0809  . ibuprofen (ADVIL,MOTRIN) tablet 600 mg  600 mg Oral Q6H PRN Oneta Rack, NP   600 mg at 07/08/17 0809  . lithium carbonate (LITHOBID) CR tablet 600 mg  600 mg Oral Q12H Oneta Rack, NP   600 mg at 07/08/17 0809  . LORazepam (ATIVAN) tablet 2 mg  2 mg Oral Q8H PRN Cobos, Rockey Situ, MD   2 mg at 07/08/17 0809  . magnesium hydroxide (MILK OF MAGNESIA) suspension 30 mL  30 mL Oral Daily PRN Oneta Rack, NP      . nicotine polacrilex (NICORETTE) gum 2 mg  2 mg Oral PRN Cobos, Rockey Situ, MD   2 mg at 07/08/17 0809  . QUEtiapine (SEROQUEL) tablet 200 mg  200 mg Oral QHS Oneta Rack, NP   200 mg at 07/07/17 2220  . QUEtiapine (SEROQUEL) tablet 50 mg  50 mg Oral Q6H PRN Cobos, Rockey Situ, MD   50 mg at 07/07/17 1528  . traZODone (DESYREL) tablet 100 mg  100 mg Oral QHS PRN Micheal Likens, MD   100 mg at 07/07/17 2220  . ziprasidone (GEODON) injection 20 mg  20 mg Intramuscular Q12H PRN Cobos, Rockey Situ, MD        PTA Medications: Medications Prior to Admission  Medication Sig Dispense Refill  Last Dose  . albuterol (PROVENTIL HFA;VENTOLIN HFA) 108 (90 Base) MCG/ACT inhaler Inhale 1 puff into the lungs every 6 (six) hours as needed for wheezing or shortness of breath.   rescue at rescue  . gabapentin (NEURONTIN) 400 MG capsule Take 400 mg by mouth 3 (three) times daily.   Past Month at Unknown time  . Multiple Vitamin (MULTIVITAMIN) tablet Take 1 tablet by mouth daily.   Past Month at Unknown time    Treatment Modalities: Medication Management, Group therapy, Case management,  1 to 1 session with clinician, Psychoeducation, Recreational therapy.  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 Primary Diagnosis: Bipolar 1 disorder, mixed, moderate (HCC) Long Term Goal(s): Improvement in symptoms so as ready for discharge Short Term Goals: Ability to identify changes in lifestyle to reduce recurrence of condition will improve Ability to disclose and discuss suicidal ideas Ability to demonstrate self-control will improve Ability to disclose and discuss suicidal ideas Ability to identify and develop effective coping behaviors will improve Compliance with prescribed medications will improve  Medication Management: Evaluate patient's response,  side effects, and tolerance of medication regimen.  Therapeutic Interventions: 1 to 1 sessions, Unit Group sessions and Medication administration.  Evaluation of Outcomes: Progressing  Physician Treatment Plan for Secondary Diagnosis: Principal Problem:   Bipolar 1 disorder, mixed, moderate (HCC)  Long Term Goal(s): Improvement in symptoms so as ready for discharge  Short Term Goals: Ability to identify changes in lifestyle to reduce recurrence of condition will  improve Ability to disclose and discuss suicidal ideas Ability to demonstrate self-control will improve Ability to disclose and discuss suicidal ideas Ability to identify and develop effective coping behaviors will improve Compliance with prescribed medications will improve  Medication Management: Evaluate patient's response, side effects, and tolerance of medication regimen.  Therapeutic Interventions: 1 to 1 sessions, Unit Group sessions and Medication administration.  Evaluation of Outcomes: Progressing   11/7: Pt continues to have episodes of agitation by threatening staff, and last night he received IM haldol and ativan to address his symptoms. Today, he is drowsy from PRN's overnight. Discussed with patient about maintaining his behaviors as safe as a step towards being appropriate for discharge, and pt verbalized understanding. -Continue inpatient hospitalization - Bipolar disorder -ContinueLithium to 600 mgrs BID for mood disorder  -ContinueSeroquel 50 mgrs Q 6 hours PRN for agitation - Continue Ativan 1 mgr Q 6 hours PRN for agitation -Continue Neurontin 400 mgrs TID for pain, anxiety  -ContinueSeroquel to 200 mgrs QHS for mood disorder     RN Treatment Plan for Primary Diagnosis: Bipolar 1 disorder, mixed, moderate (HCC) Long Term Goal(s): Knowledge of disease and therapeutic regimen to maintain health will improve  Short Term Goals: Ability to identify and develop effective coping behaviors will improve and Compliance with prescribed medications will improve  Medication Management: RN will administer medications as ordered by provider, will assess and evaluate patient's response and provide education to patient for prescribed medication. RN will report any adverse and/or side effects to  prescribing provider.  Therapeutic Interventions: 1 on 1 counseling sessions, Psychoeducation, Medication administration, Evaluate responses to treatment, Monitor vital signs and CBGs as ordered, Perform/monitor CIWA, COWS, AIMS and Fall Risk screenings as ordered, Perform wound care treatments as ordered.  Evaluation of Outcomes: Progressing  LCSW Treatment Plan for Primary Diagnosis: Bipolar 1 disorder, mixed, moderate (HCC) Long Term Goal(s): Safe transition to appropriate next level of care at discharge, Engage patient in therapeutic group addressing interpersonal concerns. Short Term Goals: Engage patient in aftercare planning with referrals and resources, Facilitate patient progression through stages of change regarding substance use diagnoses and concerns, Identify triggers associated with mental health/substance abuse issues and Increase skills for wellness and recovery  Therapeutic Interventions: Assess for all discharge needs, 1 to 1 time with Social worker, Explore available resources and support systems, Assess for adequacy in community support network, Educate family and significant other(s) on suicide prevention, Complete Psychosocial Assessment, Interpersonal group therapy.  Evaluation of Outcomes: Progressing  Progress in Treatment: Attending groups: Intermittently   Participating in groups: Yes, when he attends. Taking medication as prescribed: Yes, MD continues to assess for medication changes as needed Toleration medication: Yes, no side effects reported at this time Family/Significant other contact made: Yes  Mother called Patient understands diagnosis: Limited insight  Discussing patient identified problems/goals with staff: Yes Medical problems stabilized or resolved: Yes Denies suicidal/homicidal ideation: Yes  Issues/concerns per patient self-inventory: None Other: N/A  New problem(s) identified: Pt continues to present with limited insight and minimal engagement in  treatment.   New Short Term/Long Term Goal(s): "  I want to get on meds that work"   Discharge Plan or Barriers: Pt states that he hopes to get into [facility] or [facility], though he is not presenting like a good candidate for rehab as he is in denial about drug use.  Reason for Continuation of Hospitalization:  Mood instability Medication stabilization    Estimated Length of Stay: 11/12  Attendees: Patient:  07/08/2017 9:17 AM  Physician: Dr. Altamese Carolina[place] 07/08/2017 9:17 AM  Nursing: Boyd KerbsPenny, RN 07/08/2017 9:17 AM  RN Care Manager:  07/08/2017 9:17 AM  Social Worker: Richelle Itood Andreka Stucki LCSW 07/08/2017 9:17 AM  Recreational Therapist:  07/08/2017 9:17 AM  Other: Reola Calkinsravis Money, NP 07/08/2017 9:17 AM  Other:  07/08/2017 9:17 AM  Other: 07/08/2017 9:17 AM  Scribe for Treatment Team:  Richelle Itood Averlee Swartz LCSW

## 2017-07-08 NOTE — Progress Notes (Signed)
Recreation Therapy Notes  Date: 07/08/17 Time: 1000 Location: 500 Hall Dayroom  Group Topic: Leisure Education, Goal Setting  Goal Area(s) Addresses:  Patient will be able to identify at least 3 goals for leisure participation.  Patient will be able to identify benefit of investing in leisure participation.  Patient will be able to identify benefit of setting leisure goals.   Behavioral Response:  None  Intervention: Worksheet  Activity: Life Goals.  Patients were given a worksheet that was broken down into 6 categories (family, friends, work/school, body, mental health and spirituality).  Patients were to identify what they were doing well, what they needed to improve and write a goal to make that improvement.  Education:  Discharge Planning, PharmacologistCoping Skills, Leisure Education   Education Outcome: Acknowledges Education/In Group Clarification Provided/Needs Additional Education  Clinical Observations:  Pt stated goals "give you structure".  Pt feel asleep and did not complete worksheet.   Caroll RancherMarjette Helem Reesor, LRT/CTRS         Caroll RancherLindsay, Suhaas Agena A 07/08/2017 12:31 PM

## 2017-07-08 NOTE — Progress Notes (Signed)
Adult Psychoeducational Group Note  Date:  07/08/2017 Time:  8:39 PM  Group Topic/Focus:  Wrap-Up Group:   The focus of this group is to help patients review their daily goal of treatment and discuss progress on daily workbooks.  Participation Level:  Active  Participation Quality:  Appropriate  Affect:  Appropriate  Cognitive:  Appropriate  Insight: Appropriate  Engagement in Group:  Engaged  Modes of Intervention:  Activity  Additional Comments:  Pt rated his day a 8. Goal is to have good communication with staff and others.  Natasha MeadKiara M Christyan Reger 07/08/2017, 8:39 PM

## 2017-07-09 DIAGNOSIS — F3162 Bipolar disorder, current episode mixed, moderate: Principal | ICD-10-CM

## 2017-07-09 NOTE — Progress Notes (Signed)
Patient has been isolative to room this shift, resting for the majority of the shift. Patient has exhibited no aggressive or assaultive behaviors toward staff or peers. Patient has been compliant with medications.   Assess patient for safety, offer medications as prescribed, engage patient in 1:1 staff talks.   Continue to monitor as planned. Patient able to contract for safety

## 2017-07-09 NOTE — [facility] Group Notes (Signed)
LCSW Group Therapy 07/09/2017 1:15pm  Type of Therapy and Topic:  Group Therapy:  Change and Accountability  Participation Level:  Did Not Attend  Description of Group In this group, patients discussed power and accountability for change.  The group identified the challenges related to accountability and the difficulty of accepting the outcomes of negative behaviors.  Patients were encouraged to openly discuss a challenge/change they could take responsibility for.  Patients discussed the use of "change talk" and positive thinking as ways to support achievement of personal goals.  The group discussed ways to give support and empowerment to peers.  Therapeutic Goals: 1. Patients will state the relationship between personal power and accountability in the change process 2. Patients will identify the positive and negative consequences of a personal choice they have made 3. Patients will identify one challenge/choice they will take responsibility for making 4. Patients will discuss the role of "change talk" and the impact of positive thinking as it supports successful personal change 5. Patients will verbalize support and affirmation of change efforts in peers  Summary of Patient Progress:    Therapeutic Modalities Solution Focused Brief Therapy Motivational Interviewing Cognitive Behavioral Therapy  Carlynn Heraldngel M Florene Brill, Student-Social Work 07/09/2017 12:58 PM

## 2017-07-09 NOTE — Progress Notes (Signed)
[facility]  07/09/2017 3:19 PM Marcus ReidDana Tabet  MRN:  [id number] Subjective:   Marcus ReidDana Henthorn is a 41 y/o M with history of Bipolar 1 and polysubstance use who was admitted with worsening depression, symptoms of mania, SI, HI, AH, and VH. Today, he reports that he did not sleep well overnight despite RN staff recording that he slept well. Pt reports initial and middle insomnia. He endorses SI without specific plan, HI which remain unchanged to attack his boss (pt will not provide additional details about his boss), AH of "voices in the background, and VH of "shapes." Pt is focused on substance use treatment during the interview. He is irritable that he was not [facility] and we discussed that pt was too agitated and threatening to be discharged at that time, but we could work towards that goal again. Pt requests for his medications to be adjusted to address his insomnia concerns and he agrees to increase of seroquel dose. Pt agrees to work with SW staff regarding evaluating available treatment options. He had no further questions, comments, or concerns.  Principal Problem: Bipolar 1 disorder, mixed, moderate (HCC) Diagnosis:   Patient Active Problem List   Diagnosis Date Noted  . Bipolar 1 disorder, mixed, moderate (HCC) [F31.62] 06/29/2017  . Polysubstance dependence including opioid type drug without complication, episodic abuse (HCC) [F19.20] 07/02/2016   Total Time spent with patient: 30 minutes  Past Psychiatric History: see H&P  Past Medical History:  Past Medical History:  Diagnosis Date  . Hepatitis C    History reviewed. No pertinent surgical history. Family History: History reviewed. No pertinent family history. Family Psychiatric  History: see H&P Social History:  Social History   Substance and Sexual Activity  Alcohol Use No     Social History   Substance and Sexual Activity  Drug Use Yes  . Frequency: 7.0 times per week  . Types: Methamphetamines, Heroin    Comment: Daily use where possible    Social History   Socioeconomic History  . Marital status: Married    Spouse name: None  . Number of children: None  . Years of education: None  . Highest education level: None  Social Needs  . Financial resource strain: None  . Food insecurity - worry: None  . Food insecurity - inability: None  . Transportation needs - medical: None  . Transportation needs - non-medical: None  Occupational History  . None  Tobacco Use  . Smoking status: Current Every Day Smoker  . Smokeless tobacco: Never Used  Substance and Sexual Activity  . Alcohol use: No  . Drug use: Yes    Frequency: 7.0 times per week    Types: Methamphetamines, Heroin    Comment: Daily use where possible  . Sexual activity: Not Currently  Other Topics Concern  . None  Social History Narrative  . None   Additional Social History:                         Sleep: Fair  Appetite:  Fair  Current Medications: Current Facility-Administered Medications  Medication Dose Route Frequency Provider Last Rate Last Dose  . acetaminophen (TYLENOL) tablet 650 mg  650 mg Oral Q6H PRN Micheal Likensainville, Lorre Opdahl T, MD   650 mg at 07/09/17 1427  . alum & mag hydroxide-simeth (MAALOX/MYLANTA) 200-200-20 MG/5ML suspension 30 mL  30 mL Oral Q4H PRN Oneta RackLewis, Tanika N, NP      . feeding supplement (ENSURE ENLIVE) (  ENSURE ENLIVE) liquid 237 mL  237 mL Oral BID BM Cobos, Rockey Situ, MD   237 mL at 07/09/17 1426  . gabapentin (NEURONTIN) capsule 400 mg  400 mg Oral TID Jolyne Loa T, MD   400 mg at 07/09/17 1213  . hydrOXYzine (ATARAX/VISTARIL) tablet 25 mg  25 mg Oral TID PRN Oneta Rack, NP   25 mg at 07/08/17 2000  . lithium carbonate (LITHOBID) CR tablet 600 mg  600 mg Oral Q12H Oneta Rack, NP   600 mg at 07/09/17 0819  . LORazepam (ATIVAN) tablet 2 mg  2 mg Oral Q8H PRN Cobos, Rockey Situ, MD   2 mg at 07/09/17 0819  . magnesium hydroxide (MILK OF MAGNESIA) suspension 30  mL  30 mL Oral Daily PRN Oneta Rack, NP      . nicotine polacrilex (NICORETTE) gum 2 mg  2 mg Oral PRN Cobos, Rockey Situ, MD   2 mg at 07/09/17 1426  . QUEtiapine (SEROQUEL) tablet 200 mg  200 mg Oral QHS Oneta Rack, NP   200 mg at 07/08/17 2103  . QUEtiapine (SEROQUEL) tablet 50 mg  50 mg Oral Q6H PRN Cobos, Rockey Situ, MD   50 mg at 07/09/17 0819  . traZODone (DESYREL) tablet 100 mg  100 mg Oral QHS PRN Micheal Likens, MD   100 mg at 07/08/17 2103  . ziprasidone (GEODON) injection 20 mg  20 mg Intramuscular Q12H PRN Cobos, Rockey Situ, MD        Lab Results: No results found for this or any previous visit (from the past 48 hour(s)).  Blood Alcohol level:  Lab Results  Component Value Date   ETH <10 06/28/2017   ETH <5 01/29/2017    Metabolic Disorder Labs: Lab Results  Component Value Date   HGBA1C 5.0 07/06/2016   MPG 97 07/06/2016   Lab Results  Component Value Date   PROLACTIN 4.2 07/06/2016   Lab Results  Component Value Date   CHOL 162 07/06/2016   TRIG 82 07/06/2016   HDL 59 07/06/2016   CHOLHDL 2.7 07/06/2016   VLDL 16 07/06/2016   LDLCALC 87 07/06/2016    Physical Findings: AIMS: Facial and Oral Movements Muscles of Facial Expression: None, normal Lips and Perioral Area: None, normal Jaw: None, normal Tongue: None, normal,Extremity Movements Upper (arms, wrists, hands, fingers): None, normal Lower (legs, knees, ankles, toes): None, normal, Trunk Movements Neck, shoulders, hips: None, normal, Overall Severity Severity of abnormal movements (highest score from questions above): None, normal Incapacitation due to abnormal movements: None, normal Patient's awareness of abnormal movements (rate only patient's report): No Awareness, Dental Status Current problems with teeth and/or dentures?: No Does patient usually wear dentures?: No  CIWA:  CIWA-Ar Total: 1 COWS:  COWS Total Score: 1  Musculoskeletal: Strength & Muscle Tone: within normal  limits Gait & Station: normal Patient leans: N/A  Psychiatric Specialty Exam: Physical Exam  Nursing note and vitals reviewed.   Review of Systems  Constitutional: Positive for malaise/fatigue. Negative for chills and fever.  Cardiovascular: Negative for chest pain.  Gastrointestinal: Negative for abdominal pain, heartburn, nausea and vomiting.  Psychiatric/Behavioral: Positive for suicidal ideas. Negative for depression.    Blood pressure 115/75, pulse (!) 104, temperature 97.7 F (36.5 C), resp. rate 14, height 5\' 11"  (1.803 m), weight 70.5 kg (155 lb 8 oz), SpO2 100 %.Body mass index is 21.69 kg/m.  General Appearance: Casual  Eye Contact:  Fair  Speech:  Clear and  Coherent and Normal Rate  Volume:  Decreased  Mood:  Depressed and Irritable  Affect:  Congruent and Constricted  Thought Process:  Coherent and Goal Directed  Orientation:  Full (Time, Place, and Person)  Thought Content:  Hallucinations: Auditory Visual and Paranoid Ideation  Suicidal Thoughts:  Yes.  without intent/plan  Homicidal Thoughts:  Yes.  without intent/plan  Memory:  Immediate;   Fair Recent;   Fair Remote;   Fair  Judgement:  Poor  Insight:  Lacking  Psychomotor Activity:  Normal  Concentration:  Concentration: Fair  Recall:  [name]  Fund of Knowledge:  Fair  Language:  Fair  Akathisia:  No  Handed:    AIMS (if indicated):     Assets:  Leisure Time Physical Health Resilience Social Support  ADL's:  Intact  Cognition:  WNL  Sleep:  Number of Hours: 6     Treatment Plan Summary: Daily contact with patient to assess and evaluate symptoms and progress in treatment and Medication management. Pt continues to demonstrate improvement of his agitation, irritability, and manic symptoms. He reports some difficulty with initial and middle insomnia and he agrees to increase dose of seroquel. He desires referral to substance use treatment programs. -Continue inpatient hospitalization - Bipolar  disorder     -ContinueLithium to 600 mgrs BID for mood disorder  -ContinueSeroquel 50 mgrs Q 6 hours PRN for agitation - Continue Ativan 1 mgr Q 6 hours PRN for agitation -Continue Neurontin 400 mgrs TID for pain, anxiety  -ChangeSeroquel to 200 mgrs QHS for mood disorderto Seroquel 250mg  qhs - Encourage participation in groups and the therapeutic milieu - Discharge planning will be ongoing  Micheal Likenshristopher T Aayush Gelpi, MD 07/09/2017, 3:19 PM

## 2017-07-09 NOTE — Progress Notes (Signed)
  DATA ACTION RESPONSE  Objective- Pt. is visible at the nurses station, seen interacting mostly with staff. Presents with an animated/anxious affect and mood. Pt appears needy with attention-seeking behaviors.C/o of anxiety this evening.  Subjective- Denies having any SI/HI/AVH/Pain at this time. Is cooperative and remain safe on the unit.  1:1 interaction in private to establish rapport. Encouragement, education, & support given from staff.  PRN Vistaril and Ativan requested and will re-eval accordingly.   Safety maintained with Q 15 checks. Continue with POC.

## 2017-07-09 NOTE — Progress Notes (Signed)
Recreation Therapy Notes  Date: 07/09/17 Time: 1015 Location: 500 Hall Dayroom  Group Topic: Communication  Goal Area(s) Addresses:  Patient will effectively communicate with peers in group.  Patient will verbalize benefit of healthy communication. Patient will verbalize positive effect of healthy communication on post d/c goals.  Patient will identify communication techniques that made activity effective for group.   Intervention:  Geometrical drawings, pencils, blank paper  Activity: Back to Back Drawings.  Patients were divided into groups of 2.  One person gave the instructions and the other drew the picture according to the instructions given.  The person drawing was not allowed to ask any questions of the person giving the directions.  Patients switched roles in the second round.   Education: Communication, Discharge Planning  Education Outcome: Acknowledges understanding/In group clarification offered/Needs additional education.   Clinical Observations/Feedback: Pt did not attend group.    Caroll RancherMarjette Levenia Skalicky, LRT/CTRS         Caroll RancherLindsay, Lawrence Roldan A 07/09/2017 11:58 AM

## 2017-07-10 MED ORDER — LOPERAMIDE HCL 2 MG PO CAPS
2.0000 mg | ORAL_CAPSULE | ORAL | Status: DC | PRN
  Administered 2017-07-10 – 2017-07-13 (×4): 2 mg via ORAL
  Filled 2017-07-10 (×3): qty 1

## 2017-07-10 MED ORDER — LOPERAMIDE HCL 2 MG PO CAPS
ORAL_CAPSULE | ORAL | Status: AC
  Filled 2017-07-10: qty 1

## 2017-07-10 NOTE — Plan of Care (Signed)
Patient is med compliant at this time.   Patient reports poor sleep. States he is experiencing fatigue and is asking to nap.

## 2017-07-10 NOTE — Progress Notes (Signed)
D: Patient observed up and visible in the milieu. Frequently at the nurses' station with requests. Asking for prns. Patient verbalizes he is experiencing AVH -- "I hear music and I see shapes". Patient's affect flat, anxious and preoccupied with congruent mood. Per self inventory and discussions with writer, rates depression at a 7/10, hopelessness at a 9/10 and anxiety at a 7/10. Rates sleep as fair, energy as normal and concentration as good.  Reporting toe pain on his right foot at an 8/10. Also complaining of anxiety and diarrhea and requests prns.   A: Medicated per orders, prn tylenol, imodium and ativan given for various complaints. Level III obs in place for safety. Emotional support offered and self inventory reviewed. Encouraged completion of Suicide Safety Plan and programming participation. Discussed POC with MD, SW.  Fall prevention plan in place and reviewed with patient as pt is a high fall risk due to hx of frequent falls PTA.   R: Patient verbalizes understanding of POC, falls prevention education. Patient endorsing passive SI and verbally contracts for safety. Patient remains safe on level III obs. Will continue to monitor closely and make verbal contact frequently.

## 2017-07-10 NOTE — Progress Notes (Signed)
Recreation Therapy Notes  Date: 07/10/17 Time: 0945 Location: 500 Hall Dayroom  Group Topic: Self-Expression  Goal Area(s) Addresses:  Patient will successfully identify positive attributes about themselves.  Patient will successfully identify benefit of improved self-expression.   Intervention: None  Activity: Show Your Talent.  Patients were encouraged to share a talent they have with the group.  Patients could sing, dance, do poetry, draw, etc.  Education:  Self-Expression, Discharge Planning.   Education Outcome: Acknowledges education/In group clarification offered/Needs additional education  Clinical Observations/Feedback: Pt did not attend group.   Darianny Momon, LRT/CTRS         Jen Eppinger A 07/10/2017 11:23 AM 

## 2017-07-10 NOTE — Progress Notes (Signed)
[facility]  07/10/2017 1:07 PM Marcus ReidDana Greenhalgh  MRN:  [id number] Subjective:   Marcus [name] is a 41 y/o M with history of Bipolar 1 with mixed features and polysubstance abuse who was admitted with worsening mood and psychotic symptoms, SI, and HI. Today he reports that he feels "not great," and he specifically mentions that he has been having some loose stool. He notes that his mood has been improving somewhat but he continues to have SI without a specific plan; however, pt notes that he feels he would be impulsive outside the hospital and attempt using method which is most accessible to him. He explains, "If I'm near a bridge I might jump off that but if I have access to a knife, I'd probably dig into my arm." He continues to endorse HI towards his boss but pt states that he would not seek out this individual, but there is a chance they would see each other at random outside the hospital. He continues to endorse AH of "voices" and VH of "patterns." He is tolerating his current medications, and he feels that seroquel has been helpful. He is focused on discharging to a substance use program and he has been working with SW staff on arranging opportunities. [facility] [place] and he indicates that he is motivated to discharge to that program if available. He had no further questions, comments, or concerns.  Principal Problem: Bipolar 1 disorder, mixed, moderate (HCC) Diagnosis:   Patient Active Problem List   Diagnosis Date Noted  . Bipolar 1 disorder, mixed, moderate (HCC) [F31.62] 06/29/2017  . Polysubstance dependence including opioid type drug without complication, episodic abuse (HCC) [F19.20] 07/02/2016   Total Time spent with patient: 30 minutes  Past Psychiatric History: see H&P  Past Medical History:  Past Medical History:  Diagnosis Date  . Hepatitis C    History reviewed. No pertinent surgical history. Family History: History reviewed. No pertinent family  history. Family Psychiatric  History: see H&P Social History:  Social History   Substance and Sexual Activity  Alcohol Use No     Social History   Substance and Sexual Activity  Drug Use Yes  . Frequency: 7.0 times per week  . Types: Methamphetamines, Heroin   Comment: Daily use where possible    Social History   Socioeconomic History  . Marital status: Married    Spouse name: None  . Number of children: None  . Years of education: None  . Highest education level: None  Social Needs  . Financial resource strain: None  . Food insecurity - worry: None  . Food insecurity - inability: None  . Transportation needs - medical: None  . Transportation needs - non-medical: None  Occupational History  . None  Tobacco Use  . Smoking status: Current Every Day Smoker  . Smokeless tobacco: Never Used  Substance and Sexual Activity  . Alcohol use: No  . Drug use: Yes    Frequency: 7.0 times per week    Types: Methamphetamines, Heroin    Comment: Daily use where possible  . Sexual activity: Not Currently  Other Topics Concern  . None  Social History Narrative  . None   Additional Social History:                         Sleep: Fair  Appetite:  Fair  Current Medications: Current Facility-Administered Medications  Medication Dose Route Frequency Provider Last Rate  Last Dose  . acetaminophen (TYLENOL) tablet 650 mg  650 mg Oral Q6H PRN Micheal Likens, MD   650 mg at 07/10/17 1115  . alum & mag hydroxide-simeth (MAALOX/MYLANTA) 200-200-20 MG/5ML suspension 30 mL  30 mL Oral Q4H PRN Oneta Rack, NP      . feeding supplement (ENSURE ENLIVE) (ENSURE ENLIVE) liquid 237 mL  237 mL Oral BID BM Cobos, Rockey Situ, MD   237 mL at 07/10/17 0832  . gabapentin (NEURONTIN) capsule 400 mg  400 mg Oral TID Jolyne Loa T, MD   400 mg at 07/10/17 1116  . hydrOXYzine (ATARAX/VISTARIL) tablet 25 mg  25 mg Oral TID PRN Oneta Rack, NP   25 mg at 07/09/17 2110   . lithium carbonate (LITHOBID) CR tablet 600 mg  600 mg Oral Q12H Oneta Rack, NP   600 mg at 07/10/17 0830  . loperamide (IMODIUM) capsule 2 mg  2 mg Oral PRN Nira Conn A, NP   2 mg at 07/10/17 1115  . LORazepam (ATIVAN) tablet 2 mg  2 mg Oral Q8H PRN Cobos, Rockey Situ, MD   2 mg at 07/10/17 1115  . magnesium hydroxide (MILK OF MAGNESIA) suspension 30 mL  30 mL Oral Daily PRN Oneta Rack, NP      . nicotine polacrilex (NICORETTE) gum 2 mg  2 mg Oral PRN Cobos, Rockey Situ, MD   2 mg at 07/09/17 1426  . QUEtiapine (SEROQUEL) tablet 200 mg  200 mg Oral QHS Oneta Rack, NP   200 mg at 07/09/17 2110  . QUEtiapine (SEROQUEL) tablet 50 mg  50 mg Oral Q6H PRN Cobos, Rockey Situ, MD   50 mg at 07/09/17 0819  . traZODone (DESYREL) tablet 100 mg  100 mg Oral QHS PRN Micheal Likens, MD   100 mg at 07/08/17 2103  . ziprasidone (GEODON) injection 20 mg  20 mg Intramuscular Q12H PRN Cobos, Rockey Situ, MD        Lab Results: No results found for this or any previous visit (from the past 48 hour(s)).  Blood Alcohol level:  Lab Results  Component Value Date   ETH <10 06/28/2017   ETH <5 01/29/2017    Metabolic Disorder Labs: Lab Results  Component Value Date   HGBA1C 5.0 07/06/2016   MPG 97 07/06/2016   Lab Results  Component Value Date   PROLACTIN 4.2 07/06/2016   Lab Results  Component Value Date   CHOL 162 07/06/2016   TRIG 82 07/06/2016   HDL 59 07/06/2016   CHOLHDL 2.7 07/06/2016   VLDL 16 07/06/2016   LDLCALC 87 07/06/2016    Physical Findings: AIMS: Facial and Oral Movements Muscles of Facial Expression: None, normal Lips and Perioral Area: None, normal Jaw: None, normal Tongue: None, normal,Extremity Movements Upper (arms, wrists, hands, fingers): None, normal Lower (legs, knees, ankles, toes): None, normal, Trunk Movements Neck, shoulders, hips: None, normal, Overall Severity Severity of abnormal movements (highest score from questions above): None,  normal Incapacitation due to abnormal movements: None, normal Patient's awareness of abnormal movements (rate only patient's report): No Awareness, Dental Status Current problems with teeth and/or dentures?: No Does patient usually wear dentures?: No  CIWA:  CIWA-Ar Total: 1 COWS:  COWS Total Score: 1  Musculoskeletal: Strength & Muscle Tone: within normal limits Gait & Station: normal Patient leans: N/A  Psychiatric Specialty Exam: Physical Exam  Nursing note and vitals reviewed.   Review of Systems  Constitutional: Negative for chills  and fever.  Respiratory: Negative for cough.   Cardiovascular: Negative for chest pain.  Gastrointestinal: Negative for abdominal pain, heartburn, nausea and vomiting.  Skin: Negative for rash.  Psychiatric/Behavioral: Positive for depression, hallucinations and suicidal ideas. The patient is nervous/anxious.     Blood pressure 123/79, pulse (!) 104, temperature 98.4 F (36.9 C), temperature source Oral, resp. rate 16, height 5\' 11"  (1.803 m), weight 70.5 kg (155 lb 8 oz), SpO2 100 %.Body mass index is 21.69 kg/m.  General Appearance: Casual and Fairly Groomed  Eye Contact:  Good  Speech:  Clear and Coherent and Normal Rate  Volume:  Normal  Mood:  Anxious and Depressed  Affect:  Congruent and Constricted  Thought Process:  Coherent and Goal Directed  Orientation:  Full (Time, Place, and Person)  Thought Content:  Hallucinations: Auditory Visual  Suicidal Thoughts:  Yes.  with intent/plan  Homicidal Thoughts:  Yes.  without intent/plan  Memory:  Immediate;   Fair Recent;   Fair Remote;   Fair  Judgement:  Impaired  Insight:  Lacking  Psychomotor Activity:  Normal  Concentration:  Concentration: Fair  Recall:  [name]  Fund of Knowledge:  Fair  Language:  Fair  Akathisia:  No  Handed:    AIMS (if indicated):     Assets:  Communication Skills Leisure Time Physical Health Resilience Social Support  ADL's:  Intact  Cognition:  WNL   Sleep:  Number of Hours: 5.75     Treatment Plan Summary: Daily contact with patient to assess and evaluate symptoms and progress in treatment and Medication management. Pt has some improvement of mood and agitation symptoms, but he continues to endorse SI, HI, AH, and VH. He is motivated towards seeking substance use treatment and voices that his preference would be [facility] in [place].  -Continue inpatient hospitalization - Bipolar disorder             -ContinueLithium to 600 mgrs BID for mood disorder  -ContinueSeroquel 50 mgrs Q 6 hours PRN for agitation - Continue Ativan 1 mgr Q 6 hours PRN for agitation -Continue Neurontin 400 mgrs TID for pain, anxiety  -Continue Seroquel 250mg  qhs - Encourage participation in groups and the therapeutic milieu - Discharge planning will be ongoing   Micheal Likenshristopher T Saige Busby, MD 07/10/2017, 1:07 PM

## 2017-07-10 NOTE — [facility] Group Notes (Signed)
LCSW Group Therapy Note   07/10/2017 1:15pm   Type of Therapy and Topic:  Group Therapy:  Positive Affirmations   Participation Level:  Did Not Attend  Description of Group: This group addressed positive affirmation toward self and others. Patients went around the room and identified two positive things about themselves and two positive things about a peer in the room. Patients reflected on how it felt to share something positive with others, to identify positive things about themselves, and to hear positive things from others. Patients were encouraged to have a daily reflection of positive characteristics or circumstances.  Therapeutic Goals 1. Patient will verbalize two of their positive qualities 2. Patient will demonstrate empathy for others by stating two positive qualities about a peer in the group 3. Patient will verbalize their feelings when voicing positive self affirmations and when voicing positive affirmations of others 4. Patients will discuss the potential positive impact on their wellness/recovery of focusing on positive traits of self and others. Summary of Patient Progress:  Marcus HarmanDana did not want to come to group but was forced to come into the room due to a fire drill.  After the drill he returned to his room.  Therapeutic Modalities Cognitive Behavioral Therapy Motivational Interviewing  Marcus [name], Student-Social Work 07/10/2017 1:27 PM

## 2017-07-10 NOTE — Progress Notes (Signed)
DATA ACTION RESPONSE  Objective- Pt. is visible at the dayroom, seen interacting with peers and watching TV. Presents with an animated affect and mood. Pt appears needy with attention-seeking behaviors.C/o of anxiety this evening.Pt has poor boundaries.  Subjective- Denies having any SI/HI/AVH/Pain at this time. Is cooperative and remain safe on the unit.  1:1 interaction in private to establish rapport. Encouragement, education, & support given from staff. PRN Vistaril and Ativan requested and will re-eval accordingly.   Safety maintained with Q 15 checks. Continue with POC.

## 2017-07-10 NOTE — Progress Notes (Signed)
Patient has rested at brief intervals. This afternoon has been speaking with a young male peer and appears to follow her to the areas she's in (dayroom and hall). Exhibits poor boundaries. Speaking to her about illegal drugs and how he previously sold them. Also suggesting they "meet up after we leave here." Patient's presentation incongruent with his reports of AVH with no evidence of internal stimuli. Conversation is linear and spontaneous. Patient's behavior redirected as needed. Reminded of appropriate boundaries. Will continue to monitor closely as male peer has commented she feels uncomfortable.

## 2017-07-11 DIAGNOSIS — F333 Major depressive disorder, recurrent, severe with psychotic symptoms: Secondary | ICD-10-CM

## 2017-07-11 NOTE — Progress Notes (Signed)
Dar Note: Patient presents with flat affect and depressed mood.  Reports suicidal thoughts, auditory and visual hallucinations but contracts for safety.  Medications given as prescribed.  Routine safety checks maintained.  Vistaril 25 mg and Ativan 2 mg given for complain of anxiety and agitation with good effect.  Patient visible in milieu for therapy.  Minimal interaction with staff and peers.  Patient is safe on the unit.

## 2017-07-11 NOTE — Plan of Care (Signed)
Patient is safe and free from injury.  Routine safety checks maintained every 15 minutes. 

## 2017-07-11 NOTE — [facility] Group Notes (Signed)
  [facility] LCSW Group Therapy Note  Date/Time:  07/11/2017 11:15AM-12:00PM  Type of Therapy and Topic:  Group Therapy:  Feelings About Hospitalization  Participation Level:  Active   Description of Group This process group involved patients discussing their feelings related to being hospitalized, as well as the benefits they see to being in the hospital.  These feelings and benefits were itemized.  The group then brainstormed specific ways in which they could seek those same benefits when they discharge and return home.  Therapeutic Goals 1. Patient will identify and describe positive and negative feelings related to hospitalization 2. Patient will verbalize benefits of hospitalization to themselves personally 3. Patients will brainstorm together ways they can obtain similar benefits in the outpatient setting, identify barriers to wellness and possible solutions  Summary of Patient Progress:  The patient expressed his primary feelings about being hospitalized are negative, and he said he could not think of any positives.  However, he was open about his concerns about his discharge plan.  When told that the social work handoff said that he would be able to go [facility] for an assessment for possible admission, he was very excited.  Therapeutic Modalities Cognitive Behavioral Therapy Motivational Interviewing    Ambrose MantleMareida Grossman-Orr, LCSW 07/11/2017, 12:01 PM

## 2017-07-11 NOTE — Progress Notes (Signed)
Adult Psychoeducational Group Note  Date:  07/11/2017 Time:  9:39 PM  Group Topic/Focus:  Wrap-Up Group:   The focus of this group is to help patients review their daily goal of treatment and discuss progress on daily workbooks.  Participation Level:  Active  Participation Quality:  Appropriate  Affect:  Appropriate  Cognitive:  Appropriate  Insight: Good  Engagement in Group:  Engaged  Modes of Intervention:  Activity  Additional Comments:   Pt rated his day a 10. Goal is to get ready for [facility] and stay sober and drug free.  Natasha MeadKiara M Diamond Martucci 07/11/2017, 9:39 PM

## 2017-07-11 NOTE — Progress Notes (Signed)
[facility] MD Progress Note  07/11/2017 1:45 PM Bleu Minerd  MRN:  [id number]  Subjective: Keagen reports, "I'm doing so so. I'm hurting on my back. It is bad. You guys are giving me just Tylenol, it is not helping. I'm still feeling like hurting some one, I just don't know who. The suicidal thoughts is off & on. I felt it this morning. I will know later on if my mood is better, right now it is not".  Objective: Alvar Malinoski is a 41 y/o M with history of Bipolar 1 with mixed features and polysubstance abuse who was admitted with worsening mood and psychotic symptoms, SI, and HI. Today he reports that he feels "not great," and he specifically mentions that he has been having some loose stool. He notes that his mood has been improving somewhat but he continues to have SI without a specific plan; however, pt notes that he feels he would be impulsive outside the hospital and attempt using method which is most accessible to him. He explains, "If I'm near a bridge I might jump off that but if I have access to a knife, I'd probably dig into my arm." He continues to endorse HI towards his boss but pt states that he would not seek out this individual, but there is a chance they would see each other at random outside the hospital. He continues to endorse AH of "voices" and VH of "patterns." He is tolerating his current medications, and he feels that seroquel has been helpful. He is focused on discharging to a substance use program and he has been working with SW staff on arranging opportunities. He would like to go to [facility] in [place] and he indicates that he is motivated to discharge to that program if available. He had no further questions, comments, or concerns. 07-11-17, he did attend group sessions this morning.  Principal Problem: Bipolar 1 disorder, mixed, moderate (HCC) Diagnosis:   Patient Active Problem List   Diagnosis Date Noted  . Bipolar 1 disorder, mixed, moderate (HCC) [F31.62] 06/29/2017  .  Polysubstance dependence including opioid type drug without complication, episodic abuse (HCC) [F19.20] 07/02/2016   Total Time spent with patient: 15 minutes  Past Psychiatric History: See H&P  Past Medical History:  Past Medical History:  Diagnosis Date  . Hepatitis C    History reviewed. No pertinent surgical history.  Family History: History reviewed. No pertinent family history.  Family Psychiatric  History: See H&P  Social History:  Social History   Substance and Sexual Activity  Alcohol Use No     Social History   Substance and Sexual Activity  Drug Use Yes  . Frequency: 7.0 times per week  . Types: Methamphetamines, Heroin   Comment: Daily use where possible    Social History   Socioeconomic History  . Marital status: Married    Spouse name: None  . Number of children: None  . Years of education: None  . Highest education level: None  Social Needs  . Financial resource strain: None  . Food insecurity - worry: None  . Food insecurity - inability: None  . Transportation needs - medical: None  . Transportation needs - non-medical: None  Occupational History  . None  Tobacco Use  . Smoking status: Current Every Day Smoker  . Smokeless tobacco: Never Used  Substance and Sexual Activity  . Alcohol use: No  . Drug use: Yes    Frequency: 7.0 times per week    Types: Methamphetamines, Heroin  Comment: Daily use where possible  . Sexual activity: Not Currently  Other Topics Concern  . None  Social History Narrative  . None   Additional Social History:   Sleep: Fair  Appetite:  Fair  Current Medications: Current Facility-Administered Medications  Medication Dose Route Frequency Provider Last Rate Last Dose  . acetaminophen (TYLENOL) tablet 650 mg  650 mg Oral Q6H PRN Micheal Likensainville, Christopher T, MD   650 mg at 07/10/17 2123  . alum & mag hydroxide-simeth (MAALOX/MYLANTA) 200-200-20 MG/5ML suspension 30 mL  30 mL Oral Q4H PRN Oneta RackLewis, Tanika N, NP       . feeding supplement (ENSURE ENLIVE) (ENSURE ENLIVE) liquid 237 mL  237 mL Oral BID BM Cobos, Rockey SituFernando A, MD   237 mL at 07/11/17 1112  . gabapentin (NEURONTIN) capsule 400 mg  400 mg Oral TID Jolyne Loaainville, Christopher T, MD   400 mg at 07/11/17 1113  . hydrOXYzine (ATARAX/VISTARIL) tablet 25 mg  25 mg Oral TID PRN Oneta RackLewis, Tanika N, NP   25 mg at 07/10/17 2120  . lithium carbonate (LITHOBID) CR tablet 600 mg  600 mg Oral Q12H Oneta RackLewis, Tanika N, NP   600 mg at 07/11/17 0807  . loperamide (IMODIUM) capsule 2 mg  2 mg Oral PRN Nira ConnBerry, Jason A, NP   2 mg at 07/10/17 1115  . LORazepam (ATIVAN) tablet 2 mg  2 mg Oral Q8H PRN Cobos, Rockey SituFernando A, MD   2 mg at 07/10/17 2120  . magnesium hydroxide (MILK OF MAGNESIA) suspension 30 mL  30 mL Oral Daily PRN Oneta RackLewis, Tanika N, NP      . nicotine polacrilex (NICORETTE) gum 2 mg  2 mg Oral PRN Cobos, Rockey SituFernando A, MD   2 mg at 07/09/17 1426  . QUEtiapine (SEROQUEL) tablet 200 mg  200 mg Oral QHS Oneta RackLewis, Tanika N, NP   200 mg at 07/10/17 2120  . QUEtiapine (SEROQUEL) tablet 50 mg  50 mg Oral Q6H PRN Cobos, Rockey SituFernando A, MD   50 mg at 07/11/17 0807  . traZODone (DESYREL) tablet 100 mg  100 mg Oral QHS PRN Micheal Likensainville, Christopher T, MD   100 mg at 07/08/17 2103  . ziprasidone (GEODON) injection 20 mg  20 mg Intramuscular Q12H PRN Cobos, Rockey SituFernando A, MD       Lab Results: No results found for this or any previous visit (from the past 48 hour(s)).  Blood Alcohol level:  Lab Results  Component Value Date   ETH <10 06/28/2017   ETH <5 01/29/2017   Metabolic Disorder Labs: Lab Results  Component Value Date   HGBA1C 5.0 07/06/2016   MPG 97 07/06/2016   Lab Results  Component Value Date   PROLACTIN 4.2 07/06/2016   Lab Results  Component Value Date   CHOL 162 07/06/2016   TRIG 82 07/06/2016   HDL 59 07/06/2016   CHOLHDL 2.7 07/06/2016   VLDL 16 07/06/2016   LDLCALC 87 07/06/2016   Physical Findings: AIMS: Facial and Oral Movements Muscles of Facial Expression:  None, normal Lips and Perioral Area: None, normal Jaw: None, normal Tongue: None, normal,Extremity Movements Upper (arms, wrists, hands, fingers): None, normal Lower (legs, knees, ankles, toes): None, normal, Trunk Movements Neck, shoulders, hips: None, normal, Overall Severity Severity of abnormal movements (highest score from questions above): None, normal Incapacitation due to abnormal movements: None, normal Patient's awareness of abnormal movements (rate only patient's report): No Awareness, Dental Status Current problems with teeth and/or dentures?: No Does patient usually wear dentures?: No  CIWA:  CIWA-Ar Total: 1 COWS:  COWS Total Score: 1  Musculoskeletal: Strength & Muscle Tone: within normal limits Gait & Station: normal Patient leans: N/A  Psychiatric Specialty Exam: Physical Exam  Nursing note and vitals reviewed.   Review of Systems  Constitutional: Negative for chills and fever.  Respiratory: Negative for cough.   Cardiovascular: Negative for chest pain.  Gastrointestinal: Negative for abdominal pain, heartburn, nausea and vomiting.  Skin: Negative for rash.  Psychiatric/Behavioral: Positive for depression, hallucinations and suicidal ideas. The patient is nervous/anxious.     Blood pressure 123/79, pulse (!) 104, temperature 98.4 F (36.9 C), temperature source Oral, resp. rate 16, height 5\' 11"  (1.803 m), weight 70.5 kg (155 lb 8 oz), SpO2 100 %.Body mass index is 21.69 kg/m.  General Appearance: Casual and Fairly Groomed  Eye Contact:  Good  Speech:  Clear and Coherent and Normal Rate  Volume:  Normal  Mood:  Anxious and Depressed  Affect:  Congruent and Constricted  Thought Process:  Coherent and Goal Directed  Orientation:  Full (Time, Place, and Person)  Thought Content:  Hallucinations: Auditory Visual  Suicidal Thoughts:  Yes.  with intent/plan  Homicidal Thoughts:  Yes.  without intent/plan  Memory:  Immediate;   Fair Recent;   Fair Remote;    Fair  Judgement:  Impaired  Insight:  Lacking  Psychomotor Activity:  Normal  Concentration:  Concentration: Fair  Recall:  [name]  Fund of Knowledge:  Fair  Language:  Fair  Akathisia:  No  Handed:    AIMS (if indicated):     Assets:  Communication Skills Leisure Time Physical Health Resilience Social Support  ADL's:  Intact  Cognition:  WNL  Sleep:  Number of Hours: 6.75   Treatment Plan Summary: Daily contact with patient to assess and evaluate symptoms and progress in treatment and Medication management. Pt has some improvement of mood and agitation symptoms, but he continues to endorse SI, HI, AH, and VH. He is motivated towards seeking substance use treatment and voices that his preference would be [facility] in [place].  -Continue inpatient hospitalization  Will continue today 07/11/2017 plan as below except where it is noted.  - Bipolar disorder             -ContinueLithium to 600 mgrs BID for mood disorder               -Lithium level on 07-05-17, low at 27. Will recheck in am. -ContinueSeroquel 50 mgrs Q 6 hours PRN for agitation - Continue Ativan 1 mgr Q 6 hours PRN for agitation -Continue Neurontin 400 mgrs TID for pain, anxiety  -Continue Seroquel 250mg  qhs - Encourage participation in groups and the therapeutic milieu - Discharge planning will be ongoing  Sanjuana KavaNwoko, Agnes I, NP, PMHNP, FNP-BC. 07/11/2017, 1:45 PMPatient ID: Lorrene Reidana Salonga, male   DOB: Sep 13, 1975, 41 y.o.   MRN: [id number]

## 2017-07-12 DIAGNOSIS — F39 Unspecified mood [affective] disorder: Secondary | ICD-10-CM

## 2017-07-12 DIAGNOSIS — R45850 Homicidal ideations: Secondary | ICD-10-CM

## 2017-07-12 LAB — LITHIUM LEVEL: Lithium Lvl: 0.77 mmol/L (ref 0.60–1.20)

## 2017-07-12 NOTE — Progress Notes (Signed)
[facility]  07/12/2017 2:25 PM Marcus ReidDana [name]  MRN:  [id number]  Subjective: Marcus HarmanDana reports, "I'm still feeling SIHI. I'm seeing stuff, green turtles all over the place. My mood is fair, but there is a loud mouth patient that is making my mood worse here. My right toe was broken a while ago. It is hurting pretty much, can I have a pull up brace?".  Objective: Marcus ReidDana Finkel is a 41 y/o M with history of Bipolar 1 with mixed features and polysubstance abuse who was admitted with worsening mood and psychotic symptoms, SI, and HI. Today he reports that he feels "not great," and he specifically mentions that he has been having some loose stool. He notes that his mood has been improving somewhat but he continues to have SI without a specific plan; however, pt notes that he feels he would be impulsive outside the hospital and attempt using method which is most accessible to him. He explains, "If I'm near a bridge I might jump off that but if I have access to a knife, I'd probably dig into my arm." He continues to endorse HI towards his boss but pt states that he would not seek out this individual, but there is a chance they would see each other at random outside the hospital. He continues to endorse AH of "voices" and VH of "patterns." He is tolerating his current medications, and he feels that seroquel has been helpful. He is focused on discharging to a substance use program and he has been working with SW staff on arranging opportunities. He would [facility] to [facility] [place] and he indicates that he is motivated to discharge to that program if available. He had no further questions, comments, or concerns. 07-12-17, Marcus HarmanDana is seen, chart reviewed. He is alert, oriented x 3 & aware of situation. He continues to endorse SIHI, AVH. He says he is seeing green turtles. He denies any plans or intent to himself or anyone else. He is able to contract for safety. He is also complaining about one of the patients he  says has a loud mouth that is irritating to him. He is reassured that the staff are monitoring every patient closely for safety. He complains of injuring his big right toe a while ago. Says it is hurting badly. He says Tylenol or Ibuprofen are the effective for his pain. He is requesting a pull up brace. He is asking for Tramadol. However, patient is seen a little while later in social work group, on his feet dancing. He appears in no distress as he is dancing. He is seen smiling and laughing as he is dancing.  Principal Problem: Bipolar 1 disorder, mixed, moderate (HCC) Diagnosis:   Patient Active Problem List   Diagnosis Date Noted  . 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 Bipolar 1 disorder, mixed, moderate (HCC) [F31.62] 06/29/2017  . Polysubstance dependence including opioid type drug without complication, episodic abuse (HCC) [F19.20] 07/02/2016   Total Time spent with patient: 15 minutes  Past Psychiatric History: See H&P  Past Medical History:  Past Medical History:  Diagnosis Date  . Hepatitis C    History reviewed. No pertinent surgical history.  Family History: History reviewed. No pertinent family history.  Family Psychiatric  History: See H&P  Social History:  Social History   Substance and Sexual Activity  Alcohol Use No     Social History   Substance and Sexual Activity  Drug Use Yes  . Frequency: 7.0 times per week  . Types: Methamphetamines, Heroin   Comment: Daily use where possible    Social History   Socioeconomic History  . Marital status: Married    Spouse name: None  . Number of children: None  . Years of education: None  . Highest education level: None  Social Needs  . Financial resource strain: None  . Food insecurity - worry: None  . Food insecurity - inability: None  . Transportation needs - medical: None  . Transportation needs - non-medical: None  Occupational History  . None   Tobacco Use  . Smoking status: Current Every Day Smoker  . Smokeless tobacco: Never Used  Substance and Sexual Activity  . Alcohol use: No  . Drug use: Yes    Frequency: 7.0 times per week    Types: Methamphetamines, Heroin    Comment: Daily use where possible  . Sexual activity: Not Currently  Other Topics Concern  . None  Social History Narrative  . None   Additional Social History:   Sleep: Fair  Appetite:  Fair  Current Medications: Current Facility-Administered Medications  Medication Dose Route Frequency Provider Last Rate Last Dose  . acetaminophen (TYLENOL) tablet 650 mg  650 mg Oral Q6H PRN Micheal Likens, MD   650 mg at 07/12/17 0925  . alum & mag hydroxide-simeth (MAALOX/MYLANTA) 200-200-20 MG/5ML suspension 30 mL  30 mL Oral Q4H PRN Oneta Rack, NP      . feeding supplement (ENSURE ENLIVE) (ENSURE ENLIVE) liquid 237 mL  237 mL Oral BID BM Cobos, Rockey Situ, MD   237 mL at 07/11/17 1650  . gabapentin (NEURONTIN) capsule 400 mg  400 mg Oral TID Micheal Likens, MD   400 mg at 07/12/17 1209  . hydrOXYzine (ATARAX/VISTARIL) tablet 25 mg  25 mg Oral TID PRN Oneta Rack, NP   25 mg at 07/11/17 2116  . lithium carbonate (LITHOBID) CR tablet 600 mg  600 mg Oral Q12H Oneta Rack, NP   600 mg at 07/12/17 0923  . loperamide (IMODIUM) capsule 2 mg  2 mg Oral PRN Nira Conn A, NP   2 mg at 07/10/17 1115  . LORazepam (ATIVAN) tablet 2 mg  2 mg Oral Q8H PRN Cobos, Rockey Situ, MD   2 mg at 07/11/17 1652  . magnesium hydroxide (MILK OF MAGNESIA) suspension 30 mL  30 mL Oral Daily PRN Oneta Rack, NP      . nicotine polacrilex (NICORETTE) gum 2 mg  2 mg Oral PRN Cobos, Rockey Situ, MD   2 mg at 07/12/17 0926  . QUEtiapine (SEROQUEL) tablet 200 mg  200 mg Oral QHS Oneta Rack, NP   200 mg at 07/11/17 2116  . QUEtiapine (SEROQUEL) tablet 50 mg  50 mg Oral Q6H PRN Cobos, Rockey Situ, MD   50 mg at 07/12/17 0925  . traZODone (DESYREL) tablet 100 mg   100 mg Oral QHS PRN Micheal Likens, MD   100 mg at 07/11/17 2116  . ziprasidone (GEODON) injection 20 mg  20 mg Intramuscular Q12H PRN Cobos, Rockey Situ, MD       Lab Results:  Results for orders placed or performed during the hospital encounter of 06/29/17 (from the past 48 hour(s))  Lithium level     Status: None   Collection Time: 07/12/17  7:01 AM  Result Value Ref Range   Lithium Lvl 0.77 0.60 - 1.20 mmol/L  Comment: Performed at [facility], [address] [place], [place] [id number]    Blood Alcohol level:  Lab Results  Component Value Date   ETH <10 06/28/2017   ETH <5 01/29/2017   Metabolic Disorder Labs: Lab Results  Component Value Date   HGBA1C 5.0 07/06/2016   MPG 97 07/06/2016   Lab Results  Component Value Date   PROLACTIN 4.2 07/06/2016   Lab Results  Component Value Date   CHOL 162 07/06/2016   TRIG 82 07/06/2016   HDL 59 07/06/2016   CHOLHDL 2.7 07/06/2016   VLDL 16 07/06/2016   LDLCALC 87 07/06/2016   Physical Findings: AIMS: Facial and Oral Movements Muscles of Facial Expression: None, normal Lips and Perioral Area: None, normal Jaw: None, normal Tongue: None, normal,Extremity Movements Upper (arms, wrists, hands, fingers): None, normal Lower (legs, knees, ankles, toes): None, normal, Trunk Movements Neck, shoulders, hips: None, normal, Overall Severity Severity of abnormal movements (highest score from questions above): None, normal Incapacitation due to abnormal movements: None, normal Patient's awareness of abnormal movements (rate only patient's report): No Awareness, Dental Status Current problems with teeth and/or dentures?: No Does patient usually wear dentures?: No  CIWA:  CIWA-Ar Total: 1 COWS:  COWS Total Score: 1  Musculoskeletal: Strength & Muscle Tone: within normal limits Gait & Station: normal Patient leans: N/A  Psychiatric Specialty Exam: Physical Exam  Nursing note and vitals reviewed.    Review of Systems  Constitutional: Negative for chills and fever.  Respiratory: Negative for cough.   Cardiovascular: Negative for chest pain.  Gastrointestinal: Negative for abdominal pain, heartburn, nausea and vomiting.  Skin: Negative for rash.  Psychiatric/Behavioral: Positive for depression, hallucinations and suicidal ideas. The patient is nervous/anxious.     Blood pressure 110/64, pulse 97, temperature 97.8 F (36.6 C), temperature source Oral, resp. rate 18, height 5\' 11"  (1.803 m), weight 70.5 kg (155 lb 8 oz), SpO2 100 %.Body mass index is 21.69 kg/m.  General Appearance: Casual and Fairly Groomed  Eye Contact:  Good  Speech:  Clear and Coherent and Normal Rate  Volume:  Normal  Mood:  Anxious and Depressed  Affect:  Non-Congruent  Thought Process:  Coherent and Goal Directed  Orientation:  Full (Time, Place, and Person)  Thought Content:  Hallucinations: Auditory Visual  Suicidal Thoughts:  Yes.  with intent/plan  Homicidal Thoughts:  Yes.  without intent/plan  Memory:  Immediate;   Fair Recent;   Fair Remote;   Fair  Judgement:  Impaired  Insight:  Lacking  Psychomotor Activity:  Normal  Concentration:  Concentration: Fair  Recall:  [name]  Fund of Knowledge:  Fair  Language:  Fair  Akathisia:  No  Handed:    AIMS (if indicated):     Assets:  Communication Skills Leisure Time Physical Health Resilience Social Support  ADL's:  Intact  Cognition:  WNL  Sleep:  Number of Hours: 6.75   Treatment Plan Summary: Daily contact with patient to assess and evaluate symptoms and progress in treatment and Medication management. Pt has some improvement of mood and agitation symptoms, but he continues to endorse SI, HI, AH, and VH. He is motivated towards seeking substance use treatment and voices that his preference would be [facility] in [place]. He is complaining of right toe pain. -Continue inpatient hospitalization  Will continue today 07/12/2017 plan as below  except where it is noted.  - Bipolar disorder             -ContinueLithium to  600 mgrs BID for mood disorder               -Lithium level for 07-12-17: 77, within therapeutic range. -ContinueSeroquel 50 mgrs Q 6 hours PRN for agitation - Continue Ativan 1 mgr Q 6 hours PRN for agitation -Continue Neurontin 400 mgrs TID for pain, anxiety  -Continue Seroquel 250mg  qhs - Encourage participation in groups and the therapeutic milieu - Discharge planning will be ongoing  Sanjuana Kava, NP, PMHNP, FNP-BC. 07/12/2017, 2:25 PMPatient ID: Marcus [name], male   DOB: [birth year], 41 y.o.   MRN: [id number]

## 2017-07-12 NOTE — [facility] Group Notes (Signed)
[facility]  Date/Time:  07/12/2017  11:00AM-12:00PM  Type of Therapy and Topic:  Group Therapy:  Music and Mood  Participation Level:  Active   Description of Group: In this process group, members listened to a variety of genres of music and identified that different types of music evoke different responses.  Patients were encouraged to identify music that was soothing for them and music that was energizing for them.  Patients discussed how this knowledge can help with wellness and recovery in various ways including managing depression and anxiety as well as encouraging healthy sleep habits.    Therapeutic Goals: 1. Patients will explore the impact of different varieties of music on mood 2. Patients will verbalize the thoughts they have when listening to different types of music 3. Patients will identify music that is soothing to them as well as music that is energizing to them 4. Patients will discuss how to use this knowledge to assist in maintaining wellness and recovery 5. Patients will explore the use of music as a coping skill  Summary of Patient Progress:  At the beginning of group, patient expressed that he felt "wonderful" and he interacted well, danced a bit, and was obviously enjoying the music.  He stated he misses the music in his life while in the hospital.  Therapeutic Modalities: Solution Focused Brief Therapy Motivational Interviewing Activity   Ambrose MantleMareida Grossman-Orr, LCSW 07/12/2017 8:24 AM

## 2017-07-12 NOTE — Progress Notes (Signed)
Patient ID: Marcus [name], male   DOB: 1976-06-04, 41 y.o.   MRN: [id number]  DAR: Pt. reports SI/HI and A/V Hallucinations currently. He states he sees "green turtles." Patient does not currently endorse any plans related to SI and HI. He reports that he is able to contract for safety. He reports that his sleep last night was fair, his appetite is fair, and his concentration is good. He rates his depression level 7/10, his hopelessness level 7/10, and his anxiety level is 8/10. Patient reports pain in his right foot which he claims is broken. He received PRN Tylenol which he reports provides no relief. However, patient is seen a little while later in social work group, on his feet dancing. He appears in no distress as he is dancing. He is seen smiling and laughing as he is dancing. Support and encouragement provided to the patient. Scheduled medications administered to patient per physician's orders. Patient presents as anxious and agitated this morning. He reports that a peer on the hall is irritating him with her "loud talking." Patient states, "I'd like to take care of that." Writer administered PRN Seroquel to patient which provided him with some relief. Q15 minute checks are maintained for safety.

## 2017-07-12 NOTE — Progress Notes (Signed)
Writer has observed patient up in the dayroom watching tv and interacting with select peers. He c/o feeling anxious and requested all his medications. He attended group and reported to Clinical research associatewriter that he plans to [facility] once [facility]. Support given and safety maintained on unit with 15 min checks.

## 2017-07-13 DIAGNOSIS — R197 Diarrhea, unspecified: Secondary | ICD-10-CM

## 2017-07-13 DIAGNOSIS — R443 Hallucinations, unspecified: Secondary | ICD-10-CM

## 2017-07-13 MED ORDER — ONDANSETRON 4 MG PO TBDP
ORAL_TABLET | ORAL | Status: AC
  Filled 2017-07-13: qty 1

## 2017-07-13 MED ORDER — ONDANSETRON 4 MG PO TBDP
4.0000 mg | ORAL_TABLET | Freq: Three times a day (TID) | ORAL | Status: DC | PRN
  Administered 2017-07-13 – 2017-07-14 (×2): 4 mg via ORAL
  Filled 2017-07-13: qty 1

## 2017-07-13 NOTE — Progress Notes (Signed)
[facility] MD Progress Note  07/13/2017 2:53 PM Marcus [name]  MRN:  [id number]  Subjective: Marcus [name] reports " I am having diarrhea"   Objective:Marcus [name] is awake, alert and oriented. Seen resting in bed. Patient presents with a flat and guarded affect. Reports he chronically suicidal and homicidal. Reports he is medication compliant. Patient states he has been experiencing bouts of diarrhea. Reports visual  hallucination.  Patient continues to reports concern with "broken foot"  Patient has discharge disposition for [facility] Thursday. Patient continues to have somatic complaints.Support, encouragement and reassurance was provided.    Principal Problem: Bipolar 1 disorder, mixed, moderate (HCC) Diagnosis:   Patient Active Problem List   Diagnosis Date Noted  . 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 Bipolar 1 disorder, mixed, moderate (HCC) [F31.62] 06/29/2017  . Polysubstance dependence including opioid type drug without complication, episodic abuse (HCC) [F19.20] 07/02/2016   Total Time spent with patient: 15 minutes  Past Psychiatric History: See H&P  Past Medical History:  Past Medical History:  Diagnosis Date  . Hepatitis C    History reviewed. No pertinent surgical history.  Family History: History reviewed. No pertinent family history.  Family Psychiatric  History: See H&P  Social History:  Social History   Substance and Sexual Activity  Alcohol Use No     Social History   Substance and Sexual Activity  Drug Use Yes  . Frequency: 7.0 times per week  . Types: Methamphetamines, Heroin   Comment: Daily use where possible    Social History   Socioeconomic History  . Marital status: Married    Spouse name: None  . Number of children: None  . Years of education: None  . Highest education level: None  Social Needs  . Financial resource strain: None  . Food insecurity - worry: None  . Food insecurity - inability:  None  . Transportation needs - medical: None  . Transportation needs - non-medical: None  Occupational History  . None  Tobacco Use  . Smoking status: Current Every Day Smoker  . Smokeless tobacco: Never Used  Substance and Sexual Activity  . Alcohol use: No  . Drug use: Yes    Frequency: 7.0 times per week    Types: Methamphetamines, Heroin    Comment: Daily use where possible  . Sexual activity: Not Currently  Other Topics Concern  . None  Social History Narrative  . None   Additional Social History:   Sleep: Fair  Appetite:  Fair  Current Medications: Current Facility-Administered Medications  Medication Dose Route Frequency Provider Last Rate Last Dose  . acetaminophen (TYLENOL) tablet 650 mg  650 mg Oral Q6H PRN Micheal Likens, MD   650 mg at 07/12/17 2128  . alum & mag hydroxide-simeth (MAALOX/MYLANTA) 200-200-20 MG/5ML suspension 30 mL  30 mL Oral Q4H PRN Oneta Rack, NP      . feeding supplement (ENSURE ENLIVE) (ENSURE ENLIVE) liquid 237 mL  237 mL Oral BID BM Cobos, Rockey Situ, MD   237 mL at 07/13/17 0757  . gabapentin (NEURONTIN) capsule 400 mg  400 mg Oral TID Jolyne Loa T, MD   400 mg at 07/13/17 1212  . hydrOXYzine (ATARAX/VISTARIL) tablet 25 mg  25 mg Oral TID PRN Oneta Rack, NP   25 mg at 07/11/17 2116  . lithium carbonate (LITHOBID) CR tablet 600 mg  600 mg Oral Q12H Oneta Rack, NP   600 mg at 07/13/17 0939  .  loperamide (IMODIUM) capsule 2 mg  2 mg Oral PRN Nira ConnBerry, Jason A, NP   2 mg at 07/13/17 1211  . LORazepam (ATIVAN) tablet 2 mg  2 mg Oral Q8H PRN Cobos, Rockey SituFernando A, MD   2 mg at 07/12/17 2126  . magnesium hydroxide (MILK OF MAGNESIA) suspension 30 mL  30 mL Oral Daily PRN Oneta RackLewis, Chelbi Herber N, NP      . nicotine polacrilex (NICORETTE) gum 2 mg  2 mg Oral PRN Cobos, Rockey SituFernando A, MD   2 mg at 07/12/17 2128  . QUEtiapine (SEROQUEL) tablet 200 mg  200 mg Oral QHS Oneta RackLewis, Lorene Samaan N, NP   200 mg at 07/12/17 2126  . QUEtiapine  (SEROQUEL) tablet 50 mg  50 mg Oral Q6H PRN Cobos, Rockey SituFernando A, MD   50 mg at 07/12/17 0925  . traZODone (DESYREL) tablet 100 mg  100 mg Oral QHS PRN Micheal Likensainville, Christopher T, MD   100 mg at 07/12/17 2127  . ziprasidone (GEODON) injection 20 mg  20 mg Intramuscular Q12H PRN Cobos, Rockey SituFernando A, MD       Lab Results:  Results for orders placed or performed during the hospital encounter of 06/29/17 (from the past 48 hour(s))  Lithium level     Status: None   Collection Time: 07/12/17  7:01 AM  Result Value Ref Range   Lithium Lvl 0.77 0.60 - 1.20 mmol/L    Comment: Performed at [facility], [address] [place], [place] [address]    Blood Alcohol level:  Lab Results  Component Value Date   ETH <10 06/28/2017   ETH <5 01/29/2017   Metabolic Disorder Labs: Lab Results  Component Value Date   HGBA1C 5.0 07/06/2016   MPG 97 07/06/2016   Lab Results  Component Value Date   PROLACTIN 4.2 07/06/2016   Lab Results  Component Value Date   CHOL 162 07/06/2016   TRIG 82 07/06/2016   HDL 59 07/06/2016   CHOLHDL 2.7 07/06/2016   VLDL 16 07/06/2016   LDLCALC 87 07/06/2016   Physical Findings: AIMS: Facial and Oral Movements Muscles of Facial Expression: None, normal Lips and Perioral Area: None, normal Jaw: None, normal Tongue: None, normal,Extremity Movements Upper (arms, wrists, hands, fingers): None, normal Lower (legs, knees, ankles, toes): None, normal, Trunk Movements Neck, shoulders, hips: None, normal, Overall Severity Severity of abnormal movements (highest score from questions above): None, normal Incapacitation due to abnormal movements: None, normal Patient's awareness of abnormal movements (rate only patient's report): No Awareness, Dental Status Current problems with teeth and/or dentures?: No Does patient usually wear dentures?: No  CIWA:  CIWA-Ar Total: 1 COWS:  COWS Total Score: 1  Musculoskeletal: Strength & Muscle Tone: within normal  limits Gait & Station: normal Patient leans: N/A  Psychiatric Specialty Exam: Physical Exam  Nursing note and vitals reviewed. Constitutional: He appears well-developed.  Cardiovascular: Normal rate.    Review of Systems  Gastrointestinal: Positive for diarrhea.  Musculoskeletal:       Right side foot pain, patient reported fx  Psychiatric/Behavioral: Positive for depression, hallucinations and suicidal ideas. The patient is nervous/anxious.     Blood pressure 133/81, pulse 100, temperature 98.3 F (36.8 C), temperature source Oral, resp. rate 16, height 5\' 11"  (1.803 m), weight 70.5 kg (155 lb 8 oz), SpO2 100 %.Body mass index is 21.69 kg/m.  General Appearance: Casual and Fairly Groomed  Eye Contact:  Good  Speech:  Clear and Coherent and Normal Rate  Volume:  Normal  Mood:  Anxious and Depressed  Affect:  Non-Congruent  Thought Process:  Coherent and Goal Directed  Orientation:  Full (Time, Place, and Person)  Thought Content:  Hallucinations: Auditory Visual  Suicidal Thoughts:  Yes.  with intent/plan reports chronic ideations with mild improvement    Homicidal Thoughts:  Yes.  without intent/plan  Memory:  Immediate;   Fair Recent;   Fair Remote;   Fair  Judgement:  Impaired  Insight:  Lacking  Psychomotor Activity:  Normal  Concentration:  Concentration: Fair  Recall:  [name]  Fund of Knowledge:  Fair  Language:  Fair  Akathisia:  No  Handed:    AIMS (if indicated):     Assets:  Communication Skills Leisure Time Physical Health Resilience Social Support  ADL's:  Intact  Cognition:  WNL  Sleep:  Number of Hours: 6.25   Treatment Plan Summary: Daily contact with patient to assess and evaluate symptoms and progress in treatment and Medication management.  Will continue today 07/13/2017 plan as below except where it is noted.  - Bipolar disorder             -ContinueLithium to 600 mgrs BID for mood disorder               -Lithium level for 07-12-17: 77,  within therapeutic range. -ContinueSeroquel 50 mgrs Q 6 hours PRN for agitation - Continue Ativan 1 mgr Q 6 hours PRN for agitation -Continue Neurontin 400 mgrs TID for pain, anxiety  -Continue Seroquel 250mg  qhs  - Encourage participation in groups and the therapeutic milieu - Discharge planning will be ongoing  Oneta Rackanika N Aleeya Veitch, NP, 07/13/2017, 2:53 PM

## 2017-07-13 NOTE — Progress Notes (Signed)
Writer has observed patient up in the dayroom watching a football game on tv, He has been to himself the majority of the time spent In the dayroom. He interacts with patients appropriately. He ask for numerous pain medications for his foot and request that writer give him all the good medicines. Safety maintained on unit with 15 min checks.

## 2017-07-13 NOTE — Progress Notes (Signed)
Recreation Therapy Notes  Date: 07/13/17 Time: 1000 Location: 500 Hall Dayroom  Group Topic: Coping Skills  Goal Area(s) Addresses:  Patients will be able to identify positive coping skills. Patients will be able to identify the benefits of using coping skills post d/c.  Intervention: Worksheet   Activity: Mind map.  LRT and patients filled out the first eight boxes together with friends, anger, anxiety, depression, sadness, racing thoughts, grief and communicate.  Patients were to then come up with three coping skills for each of the situations identified.   Education: PharmacologistCoping Skills, Building control surveyorDischarge Planning.   Education Outcome: Acknowledges understanding/In group clarification offered/Needs additional education.   Clinical Observations/Feedback: Pt did not attend group.   Caroll RancherMarjette Kijuan Gallicchio, LRT/CTRS         Caroll RancherLindsay, Ciin Brazzel A 07/13/2017 11:57 AM

## 2017-07-13 NOTE — Plan of Care (Signed)
  Coping: Ability to verbalize frustrations and anger appropriately will improve- Patient currently is able to verbalize his anger and frustrations while maintaining self control. Staff remain aware of patient's reported HI. Although no one specific reported and no current plan.  07/13/2017 1153 - Progressing by Lenord Fellersopson, Vola Beneke Elizabeth, RN

## 2017-07-13 NOTE — Tx Team (Signed)
Interdisciplinary Treatment and Diagnostic Plan Update 07/13/2017 Time of Session: 9:30am  Marcus ReidDana Ohair  MRN: [id number]  Principal Diagnosis: Bipolar 1 disorder, mixed, moderate (HCC)  Secondary Diagnoses: Principal Problem:   Bipolar 1 disorder, mixed, moderate (HCC) Active Problems:   Severe episode of recurrent major depressive disorder, with psychotic features (HCC)   Current Medications:  Current Facility-Administered Medications  Medication Dose Route Frequency Provider Last Rate Last Dose  . acetaminophen (TYLENOL) tablet 650 mg  650 mg Oral Q6H PRN Micheal Likensainville, Christopher T, MD   650 mg at 07/12/17 2128  . alum & mag hydroxide-simeth (MAALOX/MYLANTA) 200-200-20 MG/5ML suspension 30 mL  30 mL Oral Q4H PRN Oneta RackLewis, Tanika N, NP      . feeding supplement (ENSURE ENLIVE) (ENSURE ENLIVE) liquid 237 mL  237 mL Oral BID BM Cobos, Rockey SituFernando A, MD   237 mL at 07/13/17 0757  . gabapentin (NEURONTIN) capsule 400 mg  400 mg Oral TID Jolyne Loaainville, Christopher T, MD   400 mg at 07/12/17 1643  . hydrOXYzine (ATARAX/VISTARIL) tablet 25 mg  25 mg Oral TID PRN Oneta RackLewis, Tanika N, NP   25 mg at 07/11/17 2116  . lithium carbonate (LITHOBID) CR tablet 600 mg  600 mg Oral Q12H Oneta RackLewis, Tanika N, NP   600 mg at 07/12/17 1945  . loperamide (IMODIUM) capsule 2 mg  2 mg Oral PRN Nira ConnBerry, Jason A, NP   2 mg at 07/13/17 0757  . LORazepam (ATIVAN) tablet 2 mg  2 mg Oral Q8H PRN Cobos, Rockey SituFernando A, MD   2 mg at 07/12/17 2126  . magnesium hydroxide (MILK OF MAGNESIA) suspension 30 mL  30 mL Oral Daily PRN Oneta RackLewis, Tanika N, NP      . nicotine polacrilex (NICORETTE) gum 2 mg  2 mg Oral PRN Cobos, Rockey SituFernando A, MD   2 mg at 07/12/17 2128  . QUEtiapine (SEROQUEL) tablet 200 mg  200 mg Oral QHS Oneta RackLewis, Tanika N, NP   200 mg at 07/12/17 2126  . QUEtiapine (SEROQUEL) tablet 50 mg  50 mg Oral Q6H PRN Cobos, Rockey SituFernando A, MD   50 mg at 07/12/17 0925  . traZODone (DESYREL) tablet 100 mg  100 mg Oral QHS PRN Micheal Likensainville, Christopher T, MD    100 mg at 07/12/17 2127  . ziprasidone (GEODON) injection 20 mg  20 mg Intramuscular Q12H PRN Cobos, Rockey SituFernando A, MD        PTA Medications: Medications Prior to Admission  Medication Sig Dispense Refill Last Dose  . albuterol (PROVENTIL HFA;VENTOLIN HFA) 108 (90 Base) MCG/ACT inhaler Inhale 1 puff into the lungs every 6 (six) hours as needed for wheezing or shortness of breath.   rescue at rescue  . gabapentin (NEURONTIN) 400 MG capsule Take 400 mg by mouth 3 (three) times daily.   Past Month at Unknown time  . Multiple Vitamin (MULTIVITAMIN) tablet Take 1 tablet by mouth daily.   Past Month at Unknown time    Treatment Modalities: Medication Management, Group therapy, Case management,  1 to 1 session with clinician, Psychoeducation, Recreational therapy.  Patient Stressors: Financial difficulties Health problems Medication change or noncompliance Occupational concerns Substance abuse Patient Strengths: Average or above average intelligence Wellsite geologistCommunication skills General fund of knowledge Motivation for treatment/growth Work Firefighterskills  Physician Treatment Plan for Primary Diagnosis: Bipolar 1 disorder, mixed, moderate (HCC) Long Term Goal(s): Improvement in symptoms so as ready for discharge Short Term Goals: Ability to identify changes in lifestyle to reduce recurrence of condition will improve Ability to disclose and  discuss suicidal ideas Ability to demonstrate self-control will improve Ability to disclose and discuss suicidal ideas Ability to identify and develop effective coping behaviors will improve Compliance with prescribed medications will improve  Medication Management: Evaluate patient's response, side effects, and tolerance of medication regimen.  Therapeutic Interventions: 1 to 1 sessions, Unit Group sessions and Medication administration.  Evaluation of Outcomes: Progressing  Physician Treatment Plan for Secondary Diagnosis: Principal Problem:   Bipolar 1 disorder,  mixed, moderate (HCC) Active Problems:   Severe episode of recurrent major depressive disorder, with psychotic features (HCC)  Long Term Goal(s): Improvement in symptoms so as ready for discharge  Short Term Goals: Ability to identify changes in lifestyle to reduce recurrence of condition will improve Ability to disclose and discuss suicidal ideas Ability to demonstrate self-control will improve Ability to disclose and discuss suicidal ideas Ability to identify and develop effective coping behaviors will improve Compliance with prescribed medications will improve  Medication Management: Evaluate patient's response, side effects, and tolerance of medication regimen.  Therapeutic Interventions: 1 to 1 sessions, Unit Group sessions and Medication administration.  Evaluation of Outcomes: Progressing   11/7: Pt continues to have episodes of agitation by threatening staff, and last night he received IM haldol and ativan to address his symptoms. Today, he is drowsy from PRN's overnight. Discussed with patient about maintaining his behaviors as safe as a step towards being appropriate for discharge, and pt verbalized understanding. -Continue inpatient hospitalization - Bipolar disorder -ContinueLithium to 600 mgrs BID for mood disorder  -ContinueSeroquel 50 mgrs Q 6 hours PRN for agitation - Continue Ativan 1 mgr Q 6 hours PRN for agitation -Continue Neurontin 400 mgrs TID for pain, anxiety  -ContinueSeroquel to 200 mgrs QHS for mood disorder     RN Treatment Plan for Primary Diagnosis: Bipolar 1 disorder, mixed, moderate (HCC) Long Term Goal(s): Knowledge of disease and therapeutic regimen to maintain health will improve  Short Term Goals: Ability to identify and develop effective coping behaviors will improve  and Compliance with prescribed medications will improve  Medication Management: RN will administer medications as ordered by provider, will assess and evaluate patient's response and provide education to patient for prescribed medication. RN will report any adverse and/or side effects to prescribing provider.  Therapeutic Interventions: 1 on 1 counseling sessions, Psychoeducation, Medication administration, Evaluate responses to treatment, Monitor vital signs and CBGs as ordered, Perform/monitor CIWA, COWS, AIMS and Fall Risk screenings as ordered, Perform wound care treatments as ordered.  Evaluation of Outcomes: Progressing  LCSW Treatment Plan for Primary Diagnosis: Bipolar 1 disorder, mixed, moderate (HCC) Long Term Goal(s): Safe transition to appropriate next level of care at discharge, Engage patient in therapeutic group addressing interpersonal concerns. Short Term Goals: Engage patient in aftercare planning with referrals and resources, Facilitate patient progression through stages of change regarding substance use diagnoses and concerns, Identify triggers associated with mental health/substance abuse issues and Increase skills for wellness and recovery  Therapeutic Interventions: Assess for all discharge needs, 1 to 1 time with Social worker, Explore available resources and support systems, Assess for adequacy in community support network, Educate family and significant other(s) on suicide prevention, Complete Psychosocial Assessment, Interpersonal group therapy.  Evaluation of Outcomes: Progressing  Progress in Treatment: Attending groups: Intermittently   Participating in groups: Yes, when he attends. Taking medication as prescribed: Yes, MD continues to assess for medication changes as needed Toleration medication: Yes, no side effects reported at this time Family/Significant other contact made: Yes  Mother called Patient understands  diagnosis: Limited insight  Discussing patient  identified problems/goals with staff: Yes Medical problems stabilized or resolved: Yes Denies suicidal/homicidal ideation: Yes  Issues/concerns per patient self-inventory: None Other: N/A  New problem(s) identified: Pt continues to present with limited insight and minimal engagement in treatment.   New Short Term/Long Term Goal(s): "I want to get on meds that work"   Discharge Plan or Barriers: Pt states that he hopes to get into [facility] or [facility], though he is not presenting like a good candidate for rehab as he is in denial about drug use.  [facility] screening on 11/14, CSW will continue to assess for placement at [facility] pending bed availability  Reason for Continuation of Hospitalization:  Mood instability Medication stabilization  07/13/17:  Pt has [facility] screening for admission on 11/14.      Estimated Length of Stay: 11/14; dc to [facility] screen  Attendees: Patient:  07/13/2017 8:58 AM  Physician: Dr. Altamese [place] 07/13/2017 8:58 AM  Nursing: Boyd Kerbs, RN 07/13/2017 8:58 AM  RN Care Manager:  07/13/2017 8:58 AM  Social Worker: Richelle Ito LCSW 07/13/2017 8:58 AM  Recreational Therapist:  07/13/2017 8:58 AM  Other: Reola Calkins, NP 07/13/2017 8:58 AM  Other:  07/13/2017 8:58 AM  Other: 07/13/2017 8:58 AM  Scribe for Treatment Team:  Richelle Ito LCSW

## 2017-07-13 NOTE — Progress Notes (Signed)
D: Pt stated he was SI/ HI/ AVH- pt contracts for safety. Pt speaks very condescending and arrogant . Pt seen in the dayroom most of the evening.   A: Pt was offered support and encouragement. Pt was given scheduled medications. Pt was encourage to attend groups. Q 15 minute checks were done for safety.   R: safety maintained on unit.

## 2017-07-13 NOTE — [facility] Group Notes (Signed)
[facility] LCSW Group Therapy Note  07/13/2017 1:15 to 1:45 PM  Type of Therapy and Topic:  Overcoming Obstacles   Participation Level:  Did Not Attend; invited to participate yet did not despite overhead announcement and encouragement by staff    Charda Janis C Folashade Gamboa, LCSW 

## 2017-07-13 NOTE — Progress Notes (Signed)
Patient ID: Marcus ReidDana Lawrance, male   DOB: [birth year], 41 y.o.   MRN: [id number]  DAR: Patient denies auditory and visual hallucinations. He reports SI/HI at this time. He is able to contract for safety. He is vague and evasive when asked about the nature of his SI and HI. He reports that his sleep last night was poor, his appetite is fair, energy level is low, and concentration is poor. He rates his depression level 7/10, hopelessness level 6/10, and anxiety level 7/10. Support and encouragement provided to the patient. Patient reports diarrhea and nausea throughout the day. PRN Zofran and Imodium was administered to patient. He was given a peanut butter and honey sandwich and ice water for dinner per patient request. He opted to stay back from the cafeteria as he reports not feeling well. Writer has encouraged patient to drink plenty of fluids and was given Gatorade. Vital signs have remained stable. Patient is minimal in the milieu throughout the day which he reports is related to not feeling well. He is cooperative and appropriate at this time. Q15 minute checks are maintained for safety.

## 2017-07-13 NOTE — Progress Notes (Signed)
Adult Psychoeducational Group Note  Date:  07/13/2017 Time:  2:13 AM  Group Topic/Focus:  Wrap-Up Group:   The focus of this group is to help patients review their daily goal of treatment and discuss progress on daily workbooks.  Participation Level:  Active  Participation Quality:  Appropriate  Affect:  Appropriate  Cognitive:  Appropriate  Insight: Appropriate  Engagement in Group:  Engaged  Modes of Intervention:  Discussion  Additional Comments:  Pt stated his goal for today was to talk with his doctor about his medication, discharge plan, and injured foot. Pt stated he was able to accomplished his goal today and felt good about it. Pt rated his over all day a 10. Pt stated tomorrow goal is to interact more with his peers.   Felipa FurnaceChristopher  Nerida Boivin 07/13/2017, 2:13 AM

## 2017-07-13 NOTE — Plan of Care (Signed)
Pt safe on the unit at this time 

## 2017-07-14 MED ORDER — TRAZODONE HCL 100 MG PO TABS: 100.0000 mg | ORAL_TABLET | Freq: Every evening | ORAL | 0 refills | Status: DC | PRN

## 2017-07-14 MED ORDER — LITHIUM CARBONATE ER 300 MG PO TBCR: 600.0000 mg | EXTENDED_RELEASE_TABLET | Freq: Two times a day (BID) | ORAL | 0 refills | Status: DC

## 2017-07-14 MED ORDER — QUETIAPINE FUMARATE 50 MG PO TABS: 50.0000 mg | ORAL_TABLET | Freq: Four times a day (QID) | ORAL | 0 refills | Status: DC | PRN

## 2017-07-14 MED ORDER — NICOTINE POLACRILEX 2 MG MT GUM: 2.0000 mg | CHEWING_GUM | OROMUCOSAL | 0 refills | Status: DC | PRN

## 2017-07-14 MED ORDER — GABAPENTIN 400 MG PO CAPS: 400.0000 mg | ORAL_CAPSULE | Freq: Three times a day (TID) | ORAL | 0 refills | Status: DC

## 2017-07-14 MED ORDER — QUETIAPINE FUMARATE 200 MG PO TABS: 200.0000 mg | ORAL_TABLET | Freq: Every day | ORAL | 0 refills | Status: DC

## 2017-07-14 MED ORDER — HYDROXYZINE HCL 25 MG PO TABS: 25.0000 mg | ORAL_TABLET | Freq: Three times a day (TID) | ORAL | 0 refills | Status: DC | PRN

## 2017-07-14 NOTE — Progress Notes (Signed)
Recreation Therapy Notes  Date: 07/14/17 Time: 1000 Location: 500 Hall Dayroom  Group Topic: Leisure Education  Goal Area(s) Addresses:  Patient will identify positive leisure activities.  Patient will identify one positive benefit of participation in leisure activities.   Behavioral Response: Engaged  Intervention: Dry erase marker, dry erase board, eraser, container with words  Activity: Pictionary.  One patient would come up and pick a word out of the container.  Patient then draws a picture for the word they picked.  The rest of the group tries to guess what the picture is.  The person that guesses correctly, gets the next turn.  Education:  Leisure Education, Building control surveyorDischarge Planning  Education Outcome: Acknowledges education/In group clarification offered/Needs additional education  Clinical Observations/Feedback: Pt was fully engaged and on topic.  Pt stated numerous times that he enjoyed the activity.  Pt stated "stress from school, work and kids" prevents people from doing leisure.  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    Caroll RancherMarjette Guenevere Roorda, LRT/CTRS         Caroll RancherLindsay, Lashaun Poch A 07/14/2017 12:07 PM

## 2017-07-14 NOTE — [facility] Group Notes (Signed)
LCSW Group Therapy Note  07/14/2017 1:15pm  Type of Therapy/Topic:  Group Therapy:  Feelings about Diagnosis  Participation Level:  Active   Description of Group:   This group will allow patients to explore their thoughts and feelings about diagnoses they have received. Patients will be guided to explore their level of understanding and acceptance of these diagnoses. Facilitator will encourage patients to process their thoughts and feelings about the reactions of others to their diagnosis and will guide patients in identifying ways to discuss their diagnosis with significant others in their lives. This group will be process-oriented, with patients participating in exploration of their own experiences, giving and receiving support, and processing challenge from other group members.   Therapeutic Goals: 1. Patient will demonstrate understanding of diagnosis as evidenced by identifying two or more symptoms of the disorder 2. Patient will be able to express two feelings regarding the diagnosis 3. Patient will demonstrate their ability to communicate their needs through discussion and/or role play  Summary of Patient Progress:   Stayed the entire time, engaged throughout. Patient presented with pleasant affect and had a logical thought process. Patient demonstrated great insight on his diagnosis by discussing factors that have played a key role and his sense of understanding of how it has impacted him. He states that [place] and he understands that he needs to enter into treatment first. Patient has a general belieif that if [place] first that he will continue using and be at risk of death. Patient understands that attending treatment along with medication management will further assist him in managing his symptoms related to his diagnosis.         Therapeutic Modalities:   Cognitive Behavioral  Therapy Brief Therapy Feelings Identification    Ida RogueRodney B Nayden Czajka, LCSW 07/14/2017 1:26 PM

## 2017-07-14 NOTE — Progress Notes (Signed)
DAR NOTE: Patient presents with calm affect and pleasant mood.  Reports suicidal thoughts, pain, auditory and visual hallucinations.  Verbally contracts for safety.  Described energy level as low and concentration as poor. Complain of diarrhea and nausea.  Zofran 4 mg given with good effect.  Rates depression at 9, hopelessness at 9, and anxiety at 8.  Maintained on routine safety checks.  Medications given as prescribed.  Support and encouragement offered as needed.  Attended group and participated.  Patient observed socializing with peers in the dayroom.

## 2017-07-14 NOTE — Discharge Summary (Signed)
Physician Discharge Summary Note  Patient:  Marcus [name] is an 41 y.o., male MRN:  [id number] DOB:  1976-05-01 Patient phone:  [phone] (home)  Patient address:   [place] [address],   Total Time spent with patient: Greater than 30 minutes  Date of Admission:  06/29/2017 Date of Discharge: 07-15-17  Reason for Admission: Worsening depression triggering suicidal thoughts with plans.   Principal Problem: Bipolar 1 disorder, mixed, moderate (HCC)  Discharge Diagnoses: Patient Active Problem List   Diagnosis Date Noted  . 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 Bipolar 1 disorder, mixed, moderate (HCC) [F31.62] 06/29/2017  . Polysubstance dependence including opioid type drug without complication, episodic abuse [facility]) [F19.20] 07/02/2016   Past Psychiatric History: Bipolar disorder, mixed, Polysubstance use disorder  Past Medical History:  Past Medical History:  Diagnosis Date  . Hepatitis C    History reviewed. No pertinent surgical history. Family History: History reviewed. No pertinent family history.  Family Psychiatric  History: See H&P  Social History:  Social History   Substance and Sexual Activity  Alcohol Use No     Social History   Substance and Sexual Activity  Drug Use Yes  . Frequency: 7.0 times per week  . Types: Methamphetamines, Heroin   Comment: Daily use where possible    Social History   Socioeconomic History  . Marital status: Married    Spouse name: None  . Number of children: None  . Years of education: None  . Highest education level: None  Social Needs  . Financial resource strain: None  . Food insecurity - worry: None  . Food insecurity - inability: None  . Transportation needs - medical: None  . Transportation needs - non-medical: None  Occupational History  . None  Tobacco Use  . Smoking status: Current Every Day Smoker  . Smokeless tobacco: Never Used   Substance and Sexual Activity  . Alcohol use: No  . Drug use: Yes    Frequency: 7.0 times per week    Types: Methamphetamines, Heroin    Comment: Daily use where possible  . Sexual activity: Not Currently  Other Topics Concern  . None  Social History Narrative  . None   Hospital Course: Marcus [name] is a 41 y/o M with history of MDD and polysubstance dependence who was admitted with worsening depression, SI with plan to jump in front of traffic, and worsened, use of multiple illicit substances. Pt explains his reasons for presenting to the ED, stating, "I got into a spot where I didn't get paid, and it was me deciding to break something over his head or hurt myself or go to the hospital - so I called 911." Pt shares that he had SI with plan to jump into traffic but he will be able to maintain his safety while at [facility]. He denies HI. He denies AH/VH. Pt endorses PTSD symptoms of hypervigilance, hyperarousal, and flashbacks. He would like to get restarted on medications after recent discharge from inpatient hospitalization at [facility] about 2 weeks ago. Pt shares that seroquel has been helpful for him and he is in agreement to increase nighttime dose and attempt trial of daytime dose of seroquel in addition to his other medications. Pt perseverates on chronic low back pain, and he notes, "I've been on street drugs for this for a long time, so nothing you give me is going to touch it - except maybe Lyrica." Discussed with patient that  we would attempt to manage some of his chronic pain with gabapentin and her verbalized good understanding. Pt had no further questions, comments, or concerns.  Marcus [name] for worsening symptoms of depression triggering suicidal ideations with plans to jump to his death. He was also using illicit drugs & have had hx of polysubstance addictions. He was in need mood stabilization treatment.   During the admission assessment, Marcus [name] was evaluated  and his presenting symptoms were identified. The medication regimen for those symptoms were discussed and initiated targeting the presenting symptoms. Marcus [name] was oriented to the unit and encouraged to participate in the unit programming. He presented no other significant pre-existing medical problems that required treatment.      While a patient in this hospital, Marcus [name] was evaluated each day by a clinical provider to ascertain his response to his treatment regimen. However, it took quite some time & a transfer from the substance abuse unit to the psychotic locked unit to get Florence's symptoms under control. As the day goes by, improvement was noted by the patient's report of decreasing symptoms, improved sleep, appetite, affect, medication tolerance, behavior & participation in unit programming. He was required on daily basis to complete a self inventory asssessment noting mood, mental status, pain, new symptoms, anxiety and concerns.  His symptoms eventually responded well to his treatment regimen, also being in a therapeutic and supportive environment assisted in the resolution of his symptoms. During the course of his hospitalization, Marcus [name] did present some disruptive behaviors. He was placed on unit restriction at some point due to behavioral issues & was on 1:1 supervision as well to assure his safety & the safety of others.   On this day of discharge,Marcus [name] was in much improved condition than upon admission. His symptoms were reported as significantly decreased or resolved completely. Upon discharge, he denies SI/HI and voiced no AVH. He was motivated to continue taking medication with a goal of continued improvement in mental health. Marcus [name] [facility] to continue further substance abuse treatment. He was discharge don; Gabapentin 400 mg for agitation, Hydroxyzine 25 mg prn for anxiety, Lithium Carbonate 600 mg for mood stabilization,Nicorette gum 2  mg for smoking cessation, Seroquel 50 mg & 500 mg respectively for agitation/mood control & Trazodone 100 mg for insomnia. [facility] in no apparent distress. Transportation per taxi cab. [facility] assisted with the taxi fare.   Physical Findings: AIMS: Facial and Oral Movements Muscles of Facial Expression: None, normal Lips and Perioral Area: None, normal Jaw: None, normal Tongue: None, normal,Extremity Movements Upper (arms, wrists, hands, fingers): None, normal Lower (legs, knees, ankles, toes): None, normal, Trunk Movements Neck, shoulders, hips: None, normal, Overall Severity Severity of abnormal movements (highest score from questions above): None, normal Incapacitation due to abnormal movements: None, normal Patient's awareness of abnormal movements (rate only patient's report): No Awareness, Dental Status Current problems with teeth and/or dentures?: No Does patient usually wear dentures?: No  CIWA:  CIWA-Ar Total: 1 COWS:  COWS Total Score: 1  Musculoskeletal: Strength & Muscle Tone: within normal limits Gait & Station: normal Patient leans: N/A  Psychiatric Specialty Exam: Physical Exam  Nursing note and vitals reviewed. Constitutional: He appears well-developed.  HENT:  Head: Normocephalic.  Eyes: Pupils are equal, round, and reactive to light.  Neck: Normal range of motion.  Cardiovascular:  Elevated pulse rate, patient denies any distress.  Respiratory:  Effort normal.  GI: Soft.  Genitourinary:  Genitourinary Comments: Deferred  Musculoskeletal: Normal range of motion.  Neurological: He is alert.    Review of Systems  Constitutional: Negative.   HENT: Negative.   Eyes: Negative.   Respiratory: Negative.   Cardiovascular: Negative.   Gastrointestinal: Negative.   Genitourinary: Negative.   Musculoskeletal: Negative.   Skin: Negative.   Neurological: Negative.   Endo/Heme/Allergies: Negative.   Psychiatric/Behavioral: Positive  for depression (Stable (Stable)), hallucinations (Hx. Psychosis (Stable)) and substance abuse (Hx. Polysubstance use disorder). Negative for memory loss and suicidal ideas. The patient has insomnia (Stable). The patient is not nervous/anxious.     Blood pressure 138/88, pulse (!) 107, temperature 97.8 F (36.6 C), temperature source Oral, resp. rate 16, height 5\' 11"  (1.803 m), weight 70.5 kg (155 lb 8 oz), SpO2 100 %.Body mass index is 21.69 kg/m.  See Md's SRA   Have you used any form of tobacco in the last 30 days? (Cigarettes, Smokeless Tobacco, Cigars, and/or Pipes): Yes  Has this patient used any form of tobacco in the last 30 days? (Cigarettes, Smokeless Tobacco, Cigars, and/or Pipes): Yes, recommended Nicorette gum 2 mg for smoking cessation.  Blood Alcohol level:  Lab Results  Component Value Date   ETH <10 06/28/2017   ETH <5 01/29/2017   Metabolic Disorder Labs:  Lab Results  Component Value Date   HGBA1C 5.0 07/06/2016   MPG 97 07/06/2016   Lab Results  Component Value Date   PROLACTIN 4.2 07/06/2016   Lab Results  Component Value Date   CHOL 162 07/06/2016   TRIG 82 07/06/2016   HDL 59 07/06/2016   CHOLHDL 2.7 07/06/2016   VLDL 16 07/06/2016   LDLCALC 87 07/06/2016   See Psychiatric Specialty Exam and Suicide Risk Assessment completed by Attending Physician prior to discharge.  Discharge destination:  [facility]  Is patient on multiple antipsychotic therapies at discharge:  No   Has Patient had three or more failed trials of antipsychotic monotherapy by history:  No  Recommended Plan for Multiple Antipsychotic Therapies: NA  Allergies as of 07/14/2017      Reactions   Bee Venom Anaphylaxis   Food Hives, Other (See Comments)   grapefruit   Penicillins Other (See Comments)   Possible reaction - pt unsure Has patient had a PCN reaction causing immediate rash, facial/tongue/throat swelling, SOB or lightheadedness with hypotension: unk Has patient  had a PCN reaction causing severe rash involving mucus membranes or skin necrosis: unk Has patient had a PCN reaction that required hospitalization: unk Has patient had a PCN reaction occurring within the last 10 years: No If all of the above answers are "NO", then may proceed with Cephalosporin use.   Tylenol [acetaminophen] Other (See Comments)   Pt states that he is not supposed to take this medication because of his Hep C.       Medication List    STOP taking these medications   albuterol 108 (90 Base) MCG/ACT inhaler Commonly known as:  PROVENTIL HFA;VENTOLIN HFA   multivitamin tablet     TAKE these medications     Indication  gabapentin 400 MG capsule Commonly known as:  NEURONTIN Take 1 capsule (400 mg total) 3 (three) times daily by mouth. For agitation What changed:  additional instructions  Indication:  Agitation   hydrOXYzine 25 MG tablet Commonly known as:  ATARAX/VISTARIL Take 1 tablet (25 mg total) 3 (three) times daily as needed by mouth for anxiety.  Indication:  Feeling Anxious   lithium carbonate 300 MG CR tablet Commonly known as:  LITHOBID Take 2 tablets (600 mg total) every 12 (twelve) hours by mouth. For mood stabilization  Indication:  Mood stabilization   nicotine polacrilex 2 MG gum Commonly known as:  NICORETTE Take 1 each (2 mg total) as needed by mouth for smoking cessation. (May purchase from over the counter at the pharmacy for smoking cessation.  Indication:  Nicotine Addiction   QUEtiapine 200 MG tablet Commonly known as:  SEROQUEL Take 1 tablet (200 mg total) at bedtime by mouth. For mood control  Indication:  Mood control   QUEtiapine 50 MG tablet Commonly known as:  SEROQUEL Take 1 tablet (50 mg total) every 6 (six) hours as needed by mouth (anxiety/agitation).  Indication:  Agitation/psychosis   traZODone 100 MG tablet Commonly known as:  DESYREL Take 1 tablet (100 mg total) at bedtime as needed by mouth for sleep.  Indication:   Trouble Sleeping      Follow-up Information    Services, [facility] Follow up on 07/15/2017.   Why:  Screening for admission appoint on Wednesday at 7:45am. Please bring 2 weeks of medications and 30 day prescriptions. Please also bring proof of [facility] residency. This is a non-smoking facility.  Contact information: [address] [place] [address] [phone]        [facility] Follow up.   Specialty:  Behavioral Health Why:  After completing treatment with [facility] please go as a walk-in to [facility] to be established for outpatient mental health services. Walk-in hours are Mon-Fri 8am-3pm. Please arrive as early as possible to be sure that you are seen. Thank you! [address] [place] [address] [phone]          Follow-up recommendations: Activity:  As tolerated Diet: As recommended by your primary care doctor. Keep all scheduled follow-up appointments as recommended.   Comments: Patient is instructed prior to discharge to: Take all medications as prescribed by his/her mental healthcare provider. Report any adverse effects and or reactions from the medicines to his/her outpatient provider promptly. Patient has been instructed & cautioned: To not engage in alcohol and or illegal drug use while on prescription medicines. In the event of worsening symptoms, patient is instructed to call the crisis hotline, 911 and or go to the nearest ED for appropriate evaluation and treatment of symptoms. To follow-up with his/her primary care provider for your other medical issues, concerns and or health care needs.   Signed: Sanjuana Kava, NP, PMHNP, FNP-BC. 07/14/2017, 3:38 PM   Patient seen, Suicide Assessment Completed.  Disposition Plan Reviewed   Saatvik Thielman is a 41 y/o M with history of bipolar disorder and polysubstance abuse who was admitted with SI, HI, depression, AH, VH, and worsening substance use. He was started on  combination of lithium and seroquel, and doses were titrated up. Since being admitted, pt has demonstrated significant improvement of his mood and psychotic symptoms. Today upon evaluation, pt denies SI. He reports having some thoughts of HI towards his boss, but he has no intent or means to seek out that individual, and pt does not want to find him. Pt reports AH are "still there in the background" and he denies VH. He is able to engage in safety planning including to contact emergency services or return to [facility] if he feels unable to maintain his own safety. Pt is future oriented about participating in treatment at [facility] for which  he has an intake scheduled for 07/15/17 in the AM. Pt agreed to continue his current regimen without changes. He had no further questions, comments, or concerns.   Plan Of Care/Follow-up recommendations:  - Discharge to outpatient level of care - Bipolar disorder -ContinueLithium to 600 mg BID for mood disorder  -ContinueSeroquel 50 mg Q 6 hours PRN for agitation   -Continue Neurontin 400 mg TID for pain, anxiety  -Continue Seroquel 250mg  qhs   Activity:  as tolerated Diet:  normal Tests:  NA Other:  see above for DC plan  Micheal Likenshristopher T Aracelys Glade, MD

## 2017-07-14 NOTE — Progress Notes (Signed)
DATA ACTION RESPONSE  Objective-Pt. is visibleat the dayroom, seen interacting with peers and watching TV.Presents with an animatedaffect and mood.Pt appears needy with attention-seeking behaviors.C/o of anxiety this evening.Pt has poor boundaries. Pt states he remains SI/HI even after discharge. [facility]    Subjective-Denies AVH/Pain at this time. Endorses SI/HI but Audiological scientistverbal contracts for safety. Remains safe on the unit.  1:1 interaction in private to establish rapport. Encouragement, education, &support given from staff. PRN Vistaril and Ativanrequested and will re-eval accordingly.  Safety maintained with Q 15 checks. Continue with POC.

## 2017-07-14 NOTE — Progress Notes (Addendum)
[name]  07/14/2017 10:50 AM Marcus [name]  MRN:  [id number] Subjective:   Marcus [name] is a 41 y/o M with history of Bipolar 1 and polysubstance abuse who was admitted with SI, HI, depression, AH, VH, and worsened substance abuse. Today upon evaluation, he reports that he is "feeling like shit" because of some ongoing nausea and emesis, which he associates with food that he has eaten. He took zofran yesterday evening and reports that it was helpful. Today he endorses SI without specific plan. He has HI towards his boss and states that he would "definitely put a cap in his head" if he were to see his boss, but he does not have any active plans to seek out his boss and pt does not have access to a weapon. He endorses AH of "background voices" and VH of "patterns." He notes that his medications have been helpful overall and he is future-oriented about his [facility]. Pt feels that he will be able to maintain his own safety [facility]. Pt had no further questions, comments, or concerns.  Principal Problem: Bipolar 1 disorder, mixed, moderate (HCC) Diagnosis:   Patient Active Problem List   Diagnosis Date Noted  . 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 Bipolar 1 disorder, mixed, moderate (HCC) [F31.62] 06/29/2017  . Polysubstance dependence including opioid type drug without complication, episodic abuse (HCC) [F19.20] 07/02/2016   Total Time spent with patient: 30 minutes  Past Psychiatric History: see H&P  Past Medical History:  Past Medical History:  Diagnosis Date  . Hepatitis C    History reviewed. No pertinent surgical history. Family History: History reviewed. No pertinent family history. Family Psychiatric  History: see H&P Social History:  Social History   Substance and Sexual Activity  Alcohol Use No     Social History   Substance and Sexual Activity  Drug Use Yes   . Frequency: 7.0 times per week  . Types: Methamphetamines, Heroin   Comment: Daily use where possible    Social History   Socioeconomic History  . Marital status: Married    Spouse name: None  . Number of children: None  . Years of education: None  . Highest education level: None  Social Needs  . Financial resource strain: None  . Food insecurity - worry: None  . Food insecurity - inability: None  . Transportation needs - medical: None  . Transportation needs - non-medical: None  Occupational History  . None  Tobacco Use  . Smoking status: Current Every Day Smoker  . Smokeless tobacco: Never Used  Substance and Sexual Activity  . Alcohol use: No  . Drug use: Yes    Frequency: 7.0 times per week    Types: Methamphetamines, Heroin    Comment: Daily use where possible  . Sexual activity: Not Currently  Other Topics Concern  . None  Social History Narrative  . None   Additional Social History:                         Sleep: Fair  Appetite:  Fair  Current Medications: Current Facility-Administered Medications  Medication Dose Route Frequency Provider Last Rate Last Dose  . acetaminophen (TYLENOL) tablet 650 mg  650 mg Oral Q6H PRN Micheal Likensainville, Lovene Maret T, MD   650 mg at 07/12/17 2128  . alum & mag hydroxide-simeth (MAALOX/MYLANTA) 200-200-20 MG/5ML suspension 30 mL  30 mL Oral Q4H PRN Oneta RackLewis, Tanika N, NP      . feeding supplement (ENSURE ENLIVE) (ENSURE ENLIVE) liquid 237 mL  237 mL Oral BID BM Cobos, Rockey SituFernando A, MD   237 mL at 07/14/17 0814  . gabapentin (NEURONTIN) capsule 400 mg  400 mg Oral TID Micheal Likensainville, Kristof Nadeem T, MD   400 mg at 07/14/17 [id number]  . hydrOXYzine (ATARAX/VISTARIL) tablet 25 mg  25 mg Oral TID PRN Oneta RackLewis, Tanika N, NP   25 mg at 07/11/17 2116  . lithium carbonate (LITHOBID) CR tablet 600 mg  600 mg Oral Q12H Oneta RackLewis, Tanika N, NP   600 mg at 07/14/17 0811  . loperamide (IMODIUM) capsule 2 mg  2 mg Oral PRN Nira ConnBerry, Jason A, NP   2 mg at  07/13/17 1211  . LORazepam (ATIVAN) tablet 2 mg  2 mg Oral Q8H PRN Cobos, Rockey SituFernando A, MD   2 mg at 07/13/17 1843  . magnesium hydroxide (MILK OF MAGNESIA) suspension 30 mL  30 mL Oral Daily PRN Oneta RackLewis, Tanika N, NP      . nicotine polacrilex (NICORETTE) gum 2 mg  2 mg Oral PRN Cobos, Rockey SituFernando A, MD   2 mg at 07/12/17 2128  . ondansetron (ZOFRAN-ODT) disintegrating tablet 4 mg  4 mg Oral Q8H PRN Oneta RackLewis, Tanika N, NP   4 mg at 07/13/17 1632  . QUEtiapine (SEROQUEL) tablet 200 mg  200 mg Oral QHS Oneta RackLewis, Tanika N, NP   200 mg at 07/13/17 2106  . QUEtiapine (SEROQUEL) tablet 50 mg  50 mg Oral Q6H PRN Cobos, Rockey SituFernando A, MD   50 mg at 07/12/17 0925  . traZODone (DESYREL) tablet 100 mg  100 mg Oral QHS PRN Micheal Likens[place], Chyanna Flock T, MD   100 mg at 07/13/17 2106  . ziprasidone (GEODON) injection 20 mg  20 mg Intramuscular Q12H PRN Cobos, Rockey SituFernando A, MD        Lab Results: No results found for this or any previous visit (from the past 48 hour(s)).  Blood Alcohol level:  Lab Results  Component Value Date   ETH <10 06/28/2017   ETH <5 01/29/2017    Metabolic Disorder Labs: Lab Results  Component Value Date   HGBA1C 5.0 07/06/2016   MPG 97 07/06/2016   Lab Results  Component Value Date   PROLACTIN 4.2 07/06/2016   Lab Results  Component Value Date   CHOL 162 07/06/2016   TRIG 82 07/06/2016   HDL 59 07/06/2016   CHOLHDL 2.7 07/06/2016   VLDL 16 07/06/2016   LDLCALC 87 07/06/2016    Physical Findings: AIMS: Facial and Oral Movements Muscles of Facial Expression: None, normal Lips and Perioral Area: None, normal Jaw: None, normal Tongue: None, normal,Extremity Movements Upper (arms, wrists, hands, fingers): None, normal Lower (legs, knees, ankles, toes): None, normal, Trunk Movements Neck, shoulders, hips: None, normal, Overall Severity Severity of abnormal movements (highest score from questions above): None, normal Incapacitation due to abnormal movements: None, normal Patient's  awareness of abnormal movements (rate only patient's report): No Awareness, Dental Status Current problems with teeth and/or dentures?: No Does patient usually wear dentures?: No  CIWA:  CIWA-Ar Total: 1 COWS:  COWS Total Score: 1  Musculoskeletal: Strength & Muscle Tone: within normal limits Gait & Station: normal Patient leans: N/A  Psychiatric Specialty Exam: Physical Exam  Nursing note and vitals reviewed.   Review of Systems  Constitutional: Negative for chills and fever.  Respiratory: Negative for cough.   Cardiovascular: Negative for chest pain.  Gastrointestinal: Negative for heartburn, nausea and vomiting.  Psychiatric/Behavioral: Positive for depression, hallucinations and suicidal ideas.    Blood pressure 138/88, pulse (!) 107, temperature 97.8 F (36.6 C), temperature source Oral, resp. rate 16, height 5\' 11"  (1.803 m), weight 70.5 kg (155 lb 8 oz), SpO2 100 %.Body mass index is 21.69 kg/m.  General Appearance: Casual and Fairly Groomed  Eye Contact:  Good  Speech:  Clear and Coherent and Normal Rate  Volume:  Normal  Mood:  Anxious and Depressed  Affect:  Congruent and Constricted  Thought Process:  Coherent and Goal Directed  Orientation:  Full (Time, Place, and Person)  Thought Content:  Logical and Hallucinations: Auditory Visual  Suicidal Thoughts:  Yes.  without intent/plan  Homicidal Thoughts:  Yes.  without intent/plan  Memory:  Immediate;   Good Recent;   Good  Judgement:  Fair  Insight:  Fair  Psychomotor Activity:  Normal  Concentration:  Concentration: Fair  Recall:  Fair  Fund of Knowledge:  Good  Language:  Good  Akathisia:  No  Handed:    AIMS (if indicated):     Assets:  Communication Skills Leisure Time Physical Health Resilience Social Support  ADL's:  Intact  Cognition:  WNL  Sleep:  Number of Hours: 6.75     Treatment Plan Summary: Daily contact with patient to assess and evaluate symptoms and progress in treatment and  Medication management. Pt continues to have improvement of mood symptoms overall since admission; he has minimal agitation and he has been participating in groups and the therapeutic milieu. He endorses SI/HI/AH/VH but he feels that he would be safe to discharge to [facility] on Wednesday AM.  - Continue inpatient hospitalization - Bipolar disorder -ContinueLithium to 600 mgrs BID for mood disorder               -Lithium level for 07-12-17: 77, within therapeutic range. -ContinueSeroquel 50 mgrs Q 6 hours PRN for agitation - Continue Ativan 1 mgr Q 6 hours PRN for agitation -Continue Neurontin 400 mgrs TID for pain, anxiety  -Continue Seroquel 250mg  qhs  - Encourage participation in groups and the therapeutic milieu - Discharge planning will be ongoing    Micheal Likens, MD 07/14/2017, 10:50 AM

## 2017-07-14 NOTE — Progress Notes (Signed)
  [facility] :  Will you be returning to the same living situation after discharge:  No. At discharge, do you have transportation home?: Yes,  cab Do you have the ability to pay for your medications: Yes,  sent with supply, mental health  Release of information consent forms completed and in the chart;  Patient's signature needed at discharge.  Patient to Follow up at: Follow-up Information    [facility] Follow up on 07/15/2017.   Why:  Screening for admission appoint on Wednesday at 7:45am. Please bring 2 weeks of medications and 30 day prescriptions. Please also [place] of [facility]. This is a non-smoking facility.  Contact information: [address] [place] [address] [phone]        [facility] Follow up.   Specialty:  Behavioral Health Why:  After completing treatment with [facility] please go as a walk[facility] to be [facility] for outpatient mental health services. Walk-in hours are Mon-Fri 8am-3pm. Please arrive as early as possible to be sure that you are seen. Thank you! Contact information: [address] [place] [address] [phone]           Next level of care provider has access to [facility] Link:no  Safety Planning and Suicide Prevention discussed: Yes,  yes  Have you used any form of tobacco in the last 30 days? (Cigarettes, Smokeless Tobacco, Cigars, and/or Pipes): Yes  Has patient been referred to the Quitline?: Patient refused referral  Patient has been referred for addiction treatment: Yes  Ida RogueRodney B Fidela Cieslak, LCSW 07/14/2017, 9:56 AM

## 2017-07-14 NOTE — Progress Notes (Signed)
Discharge- Patient verbalizes readiness for discharge: Endorses SI/HI but contracts for safety [facility], is not psychotic or delusional. A- Discharge instruction read and discussed with parents and patients. All belongings returned to patient R- Parent and patient verbalize understanding of discharge instructions.  Signed for return of belongings. Taxi called for pick-up. Escorted to the lobby.

## 2017-07-14 NOTE — B[facility] Suicide Risk Assessment (Signed)
[facility]   Principal Problem: Bipolar 1 disorder, mixed, moderate (HCC) Discharge Diagnoses:  Patient Active Problem List   Diagnosis Date Noted  . 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 Bipolar 1 disorder, mixed, moderate (HCC) [F31.62] 06/29/2017  . Polysubstance dependence including opioid type drug without complication, episodic abuse (HCC) [F19.20] 07/02/2016    Total Time spent with patient: 30 minutes  Musculoskeletal: Strength & Muscle Tone: within normal limits Gait & Station: normal Patient leans: N/A  Psychiatric Specialty Exam: Review of Systems  Constitutional: Negative for chills and fever.  Cardiovascular: Negative for chest pain.  Gastrointestinal: Negative for heartburn and nausea.  Psychiatric/Behavioral: Negative for depression, hallucinations and suicidal ideas. The patient is not nervous/anxious.     Blood pressure 138/88, pulse (!) 107, temperature 97.8 F (36.6 C), temperature source Oral, resp. rate 16, height 5\' 11"  (1.803 m), weight 70.5 kg (155 lb 8 oz), SpO2 100 %.Body mass index is 21.69 kg/m.  General Appearance: Casual  Eye Contact::  Good  Speech:  Clear and Coherent and Normal Rate  Volume:  Normal  Mood:  Euthymic  Affect:  Appropriate and Congruent  Thought Process:  Coherent  Orientation:  Full (Time, Place, and Person)  Thought Content:  Logical  Suicidal Thoughts:  No  Homicidal Thoughts:  No  Memory:  Immediate;   Good Recent;   Good Remote;   Good  Judgement:  Fair  Insight:  Fair  Psychomotor Activity:  Normal  Concentration:  Good  Recall:  Good  Fund of Knowledge:Good  Language: Good  Akathisia:  No  Handed:    AIMS (if indicated):     Assets:  Communication Skills Social Support Talents/Skills  Sleep:  Number of Hours: 6.75  Cognition: WNL  ADL's:  Intact   Mental Status Per Nursing Assessment::   On Admission:      Demographic Factors:  Male, Caucasian, Low socioeconomic status and Unemployed  Loss Factors: Financial problems/change in socioeconomic status  Historical Factors: Family history of mental illness or substance abuse  Risk Reduction Factors:   Positive social support, Positive therapeutic relationship and Positive coping skills or problem solving skills  Continued Clinical Symptoms:  Bipolar Disorder:   Mixed State Alcohol/Substance Abuse/Dependencies  Cognitive Features That Contribute To Risk:  None    Suicide Risk:  Minimal: No identifiable suicidal ideation.  Patients presenting with no risk factors but with morbid ruminations; may be classified as minimal risk based on the severity of the depressive symptoms  Follow-up Information    [facility] Follow up on 07/15/2017.   Why:  Screening for admission appoint on Wednesday at 7:45am. Please bring 2 weeks of medications and 30 day prescriptions. Please also [place] proof of [facility] [place] [facility]. This is a non-smoking facility.  Contact information: [address] [place] [address] [phone]        [facility] Follow up.   Specialty:  Behavioral Health Why:  After completing treatment with [facility] please go as a [facility] for [facility] health services. Walk-in hours are Mon-Fri 8am-3pm. Please arrive as early as possible to be sure that you are seen. Thank you! Contact information: [address] [place] [address] [phone]         Subjective data: Marcus Marcus [name] Marcus [name] is a 41 y/o M with history of bipolar disorder and polysubstance abuse who was admitted with SI, HI, depression, AH, VH,  and worsening substance use. He was started on combination of lithium and seroquel, and doses were titrated up. Since being admitted, pt has demonstrated significant improvement of his mood and psychotic symptoms. Today upon evaluation, pt denies SI. He reports having some  thoughts of HI towards his boss, but he has no intent or means to seek out that individual, and pt does not want to find him. Pt reports AH are "still there in the background" and he denies VH. He is able to engage in safety planning including to contact emergency services or [facility] if he feels unable to maintain his own safety. Pt is future oriented about participating [facility] for which he has an intake scheduled for 07/15/17 in the AM. Pt agreed to continue his current regimen without changes. He had no further questions, comments, or concerns.   Plan Of Care/Follow-up recommendations:  - Discharge to outpatient level of care - Bipolar disorder -ContinueLithium to 600 mg BID for mood disorder  -ContinueSeroquel 50 mg Q 6 hours PRN for agitation   -Continue Neurontin 400 mg TID for pain, anxiety  -Continue Seroquel 250mg  qhs   Activity:  as tolerated Diet:  normal Tests:  NA Other:  see above for DC plan  Marcus Likenshristopher T Vinette Crites, MD 07/14/2017, 3:35 PM

## 2017-10-13 DIAGNOSIS — F331 Major depressive disorder, recurrent, moderate: Secondary | ICD-10-CM | POA: Insufficient documentation

## 2017-10-21 DIAGNOSIS — F1510 Other stimulant abuse, uncomplicated: Secondary | ICD-10-CM | POA: Insufficient documentation

## 2017-12-29 DIAGNOSIS — I330 Acute and subacute infective endocarditis: Secondary | ICD-10-CM

## 2017-12-29 DIAGNOSIS — B1910 Unspecified viral hepatitis B without hepatic coma: Secondary | ICD-10-CM

## 2017-12-29 DIAGNOSIS — B955 Unspecified streptococcus as the cause of diseases classified elsewhere: Secondary | ICD-10-CM

## 2017-12-29 DIAGNOSIS — D735 Infarction of spleen: Secondary | ICD-10-CM

## 2017-12-29 DIAGNOSIS — I700 Atherosclerosis of aorta: Secondary | ICD-10-CM

## 2017-12-29 DIAGNOSIS — N280 Ischemia and infarction of kidney: Secondary | ICD-10-CM

## 2017-12-29 HISTORY — DX: Ischemia and infarction of kidney: N28.0

## 2017-12-29 HISTORY — DX: Acute and subacute infective endocarditis: B95.5

## 2017-12-29 HISTORY — DX: Unspecified viral hepatitis B without hepatic coma: B19.10

## 2017-12-29 HISTORY — DX: Infarction of spleen: D73.5

## 2017-12-29 HISTORY — DX: Acute and subacute infective endocarditis: I33.0

## 2017-12-29 HISTORY — DX: Atherosclerosis of aorta: I70.0

## 2017-12-31 DIAGNOSIS — R7881 Bacteremia: Secondary | ICD-10-CM | POA: Insufficient documentation

## 2018-01-05 DIAGNOSIS — R4189 Other symptoms and signs involving cognitive functions and awareness: Secondary | ICD-10-CM | POA: Diagnosis present

## 2018-01-12 ENCOUNTER — Emergency Department (HOSPITAL_COMMUNITY)
Admission: EM | Admit: 2018-01-12 | Discharge: 2018-01-13 | Disposition: A | Discharge status: Home / Self Care | Payer: Medicaid Other | Attending: Emergency Medicine | Admitting: Emergency Medicine

## 2018-01-12 ENCOUNTER — Encounter (HOSPITAL_COMMUNITY): Payer: Self-pay | Admitting: Emergency Medicine

## 2018-01-12 DIAGNOSIS — F319 Bipolar disorder, unspecified: Secondary | ICD-10-CM | POA: Diagnosis not present

## 2018-01-12 DIAGNOSIS — Z79899 Other long term (current) drug therapy: Secondary | ICD-10-CM | POA: Insufficient documentation

## 2018-01-12 DIAGNOSIS — F17200 Nicotine dependence, unspecified, uncomplicated: Secondary | ICD-10-CM | POA: Insufficient documentation

## 2018-01-12 DIAGNOSIS — Z7901 Long term (current) use of anticoagulants: Secondary | ICD-10-CM | POA: Diagnosis not present

## 2018-01-12 DIAGNOSIS — M6281 Muscle weakness (generalized): Secondary | ICD-10-CM | POA: Diagnosis present

## 2018-01-12 DIAGNOSIS — R531 Weakness: Secondary | ICD-10-CM | POA: Diagnosis not present

## 2018-01-12 LAB — BASIC METABOLIC PANEL
Anion gap: 10 (ref 5–15)
BUN: 13 mg/dL (ref 6–20)
CHLORIDE: 102 mmol/L (ref 101–111)
CO2: 21 mmol/L — ABNORMAL LOW (ref 22–32)
Calcium: 8.1 mg/dL — ABNORMAL LOW (ref 8.9–10.3)
Creatinine, Ser: 0.64 mg/dL (ref 0.61–1.24)
GFR calc non Af Amer: >60 mL/min (ref >60)
Glucose, Bld: 99 mg/dL (ref 65–99)
POTASSIUM: 3.7 mmol/L (ref 3.5–5.1)
SODIUM: 133 mmol/L — AB (ref 135–145)

## 2018-01-12 LAB — URINALYSIS, ROUTINE W REFLEX MICROSCOPIC
Bilirubin Urine: NEGATIVE
Glucose, UA: NEGATIVE mg/dL
HGB URINE DIPSTICK: NEGATIVE
Ketones, ur: NEGATIVE mg/dL
Leukocytes, UA: NEGATIVE
Nitrite: NEGATIVE
PROTEIN: NEGATIVE mg/dL
Specific Gravity, Urine: 1.014 (ref 1.005–1.030)
pH: 5 (ref 5.0–8.0)

## 2018-01-12 LAB — CBC
HEMATOCRIT: 26.3 % — AB (ref 39.0–52.0)
HEMOGLOBIN: 8.1 g/dL — AB (ref 13.0–17.0)
MCH: 26.6 pg (ref 26.0–34.0)
MCHC: 30.8 g/dL (ref 30.0–36.0)
MCV: 86.2 fL (ref 78.0–100.0)
PLATELETS: 338 10*3/uL (ref 150–400)
RBC: 3.05 MIL/uL — AB (ref 4.22–5.81)
RDW: 16.3 % — ABNORMAL HIGH (ref 11.5–15.5)
WBC: 11.2 10*3/uL — AB (ref 4.0–10.5)

## 2018-01-12 LAB — CBG MONITORING, ED: GLUCOSE-CAPILLARY: 92 mg/dL (ref 65–99)

## 2018-01-12 LAB — PROTIME-INR
INR: 1.57
Prothrombin Time: 18.7 seconds — ABNORMAL HIGH (ref 11.4–15.2)

## 2018-01-12 MED ORDER — SODIUM CHLORIDE 0.9 % IV SOLN
10.00 | INTRAVENOUS | Status: DC
Start: ? — End: 2018-01-12

## 2018-01-12 NOTE — ED Triage Notes (Addendum)
Per [facility] [facility], c/o generalized weakness. D/c from [facility] this morning for same. 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 A&Ox4. Ambulatory. Hx Hepatitis B and C.  Denies chest pain.

## 2018-01-12 NOTE — ED Provider Notes (Signed)
[facility]-EMERGENCY DEPT Provider Note   CSN: [id number] Arrival date & time: 01/12/18  1728     History   Chief Complaint Chief Complaint  Patient presents with  . Weakness    HPI Marcus [name] is a 42 y.o. 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    Patient states he has had some generalized weakness and lightheadedness since hospital DC. He states that this worsens when he is walking around a lot- states this is frequent given he is homeless. It is improved if he is able to rest. He states he has only had a sandwich and grape fruit juice today, no other PO intake.  Denies chest pain, dyspnea, or syncopal episode. Denies fever. Denies N/V/D or blood in stool. He has been taking all medications as prescribed including his Levaquin and his Coumadin.  Reports last IVDU was 3 months prior.  He does have hospital admission follow-up appointment scheduled for 01/27/2018.  HPI  Past Medical History:  Diagnosis Date  . Hepatitis C     Patient Active Problem List   Diagnosis Date Noted  . Severe episode of recurrent major depressive disorder, with psychotic features (HCC)   . Bipolar 1 disorder, mixed, moderate (HCC) 06/29/2017  . Polysubstance dependence including opioid type drug without complication, episodic abuse (HCC) 07/02/2016    History reviewed. No pertinent surgical history.      Home Medications    Prior to Admission medications   Medication Sig Start Date End Date Taking? Authorizing Provider  levofloxacin (LEVAQUIN) 750 MG tablet Take 750 mg by mouth daily.   Yes [provider]  warfarin (COUMADIN) 5 MG tablet Take 5 mg by mouth daily. 01/06/18  Yes [provider]  gabapentin (NEURONTIN) 400 MG capsule Take 1 capsule  (400 mg total) 3 (three) times daily by mouth. For agitation Patient not taking: Reported on 01/12/2018 07/14/17   Armandina Stammer I, NP  hydrOXYzine (ATARAX/VISTARIL) 25 MG tablet Take 1 tablet (25 mg total) 3 (three) times daily as needed by mouth for anxiety. Patient not taking: Reported on 01/12/2018 07/14/17   Armandina Stammer I, NP  lithium carbonate (LITHOBID) 300 MG CR tablet Take 2 tablets (600 mg total) every 12 (twelve) hours by mouth. For mood stabilization Patient not taking: Reported on 01/12/2018 07/14/17   Armandina Stammer I, NP  nicotine polacrilex (NICORETTE) 2 MG gum Take 1 each (2 mg total) as needed by mouth for smoking cessation. (May purchase from over the counter at the pharmacy for smoking cessation. Patient not taking: Reported on 01/12/2018 07/14/17   Armandina Stammer I, NP  QUEtiapine (SEROQUEL) 200 MG tablet Take 1 tablet (200 mg total) at bedtime by mouth. For mood control Patient not taking: Reported on 01/12/2018 07/14/17   Armandina Stammer I, NP  QUEtiapine (SEROQUEL) 50 MG tablet Take 1 tablet (50 mg total) every 6 (six) hours as needed by mouth (anxiety/agitation). Patient not taking: Reported on 01/12/2018 07/14/17   Armandina Stammer I, NP  traZODone (DESYREL) 100 MG tablet Take 1 tablet (100 mg total) at bedtime as needed by mouth for sleep. Patient not taking: Reported on 01/12/2018 07/14/17   Armandina Stammer I, NP    Family History No family history on file.  Social History Social History   Tobacco Use  . Smoking  status: Current Every Day Smoker  . Smokeless tobacco: Never Used  Substance Use Topics  . Alcohol use: No  . Drug use: Yes    Frequency: 7.0 times per week    Types: Methamphetamines, Heroin    Comment: Daily use where possible     Allergies   Bee venom; Food; Penicillins; and Tylenol [acetaminophen]   Review of Systems Review of Systems  Constitutional: Negative for chills and fever.  Respiratory: Negative for shortness of breath.   Cardiovascular: Negative  for chest pain.  Gastrointestinal: Negative for abdominal pain, blood in stool, diarrhea, nausea and vomiting.  Neurological: Positive for weakness (generalized) and light-headedness. Negative for dizziness, syncope, numbness and headaches.  All other systems reviewed and are negative.   Physical Exam Updated Vital Signs BP (!) 103/56 (BP Location: Left Arm)   Pulse (!) 110   Temp 98.2 F (36.8 C) (Oral)   Resp 18   SpO2 100%   Physical Exam  Constitutional: He appears well-developed and well-nourished. No distress.  HENT:  Head: Normocephalic and atraumatic.  Eyes: Conjunctivae are normal. Right eye exhibits no discharge. Left eye exhibits no discharge.  Cardiovascular: Normal rate and regular rhythm.  Murmur (systolic) heard. Pulses:      Radial pulses are 2+ on the right side, and 2+ on the left side.  Pulmonary/Chest: Effort normal. No respiratory distress.  Abdominal: Soft. He exhibits no distension. There is no tenderness.  Neurological: He is alert.  Clear speech.  CN III through XII grossly intact.  5 out of 5 symmetric grip strength.  Sensation grossly intact bilateral upper and lower extremities.  Skin: Skin is warm and dry. No rash noted.  Psychiatric: He has a normal mood and affect. His behavior is normal.  Nursing note and vitals reviewed.   ED Treatments / Results  Labs Results for orders placed or performed during the hospital encounter of 01/12/18  Basic metabolic panel  Result Value Ref Range   Sodium 133 (L) 135 - 145 mmol/L   Potassium 3.7 3.5 - 5.1 mmol/L   Chloride 102 101 - 111 mmol/L   CO2 21 (L) 22 - 32 mmol/L   Glucose, Bld 99 65 - 99 mg/dL   BUN 13 6 - 20 mg/dL   Creatinine, Ser 1.61 0.61 - 1.24 mg/dL   Calcium 8.1 (L) 8.9 - 10.3 mg/dL   GFR calc non Af Amer >60 >60 mL/min   GFR calc Af Amer >60 >60 mL/min   Anion gap 10 5 - 15  CBC  Result Value Ref Range   WBC 11.2 (H) 4.0 - 10.5 K/uL   RBC 3.05 (L) 4.22 - 5.81 MIL/uL   Hemoglobin 8.1  (L) 13.0 - 17.0 g/dL   HCT 09.6 (L) 04.5 - 40.9 %   MCV 86.2 78.0 - 100.0 fL   MCH 26.6 26.0 - 34.0 pg   MCHC 30.8 30.0 - 36.0 g/dL   RDW 81.1 (H) 91.4 - 78.2 %   Platelets 338 150 - 400 K/uL  Urinalysis, Routine w reflex microscopic  Result Value Ref Range   Color, Urine YELLOW YELLOW   APPearance CLEAR CLEAR   Specific Gravity, Urine 1.014 1.005 - 1.030   pH 5.0 5.0 - 8.0   Glucose, UA NEGATIVE NEGATIVE mg/dL   Hgb urine dipstick NEGATIVE NEGATIVE   Bilirubin Urine NEGATIVE NEGATIVE   Ketones, ur NEGATIVE NEGATIVE mg/dL   Protein, ur NEGATIVE NEGATIVE mg/dL   Nitrite NEGATIVE NEGATIVE   Leukocytes, UA NEGATIVE NEGATIVE  CBG monitoring, ED  Result Value Ref Range   Glucose-Capillary 92 65 - 99 mg/dL   No results found. EKG None  Radiology No results found.  Procedures Procedures (including critical care time)  Medications Ordered in ED Medications  enoxaparin (LOVENOX) injection 105 mg (105 mg Subcutaneous Not Given 01/13/18 0033)   Initial Impression / Assessment and Plan / ED Course  I have reviewed the triage vital signs and the nursing notes.  Pertinent labs & imaging results that were available during my care of the patient were reviewed by me and considered in my medical decision making (see chart for details).   Patient presents for generalized weakness and lightheadedness which has been ongoing since his discharge from the hospital. Patient had admission to [facility] 12/29/17-01/08/18 found to have endocarditis with septic embolisms  including aortic occlusion and splenic infarct. He was given heparin and transitioned to coumadin- seen by vascular and CT surgery during admission. He was seen by ID- recs for Levaquin. He left the hospital AMA. Upon extensive chart review patient has had 5 emergency department visits for similar complaints since his discharge.  Initial vitals notable for tachycardia and hypotension- Patient's vital signs are  similar to previous ER visits.   Basic labs obtained: UA without obvious infection. CBC notable for leukocytosis at 11.2- improved from 13.3 (earlier today) and 14.5 (yesterday). Hgb 8.1 today- somewhat decreased from 8.6 earlier today, was however 6.7 yesterday- no blood in stool. Mild hyponatremia at 133. Mild hypocalcemia at 8.1. Mild acidosis at 21.  Patient subtherapeutic on coumadin with INR of 1.6 this AM, was 2.3 when checked 01/09/18- repeated tonight and remains subtherapeutic at 1.57. Discussed with pharmacist Beth- will give Lovenox 1.5mg /kg injection to attempt bridging with continued Coumadin as prescribed.    23:52: Per Jeanice Lim RN patient ambulatory in the ER with steady gait, SpO2 remained at 100%, HR 110-115 with ambulation.   Patient refused Lovenox- I discussed risks/benefits/purpose of this medication- he continued to refuse.   Patient presentation, vitals, and labs appear consistent with prior ED visits since his hospital discharge. No acute indication for admission at this time. He has follow up scheduled 01/27/18 with Dr. Holland Commons- Internal medicine with [facility]- I stressed the importance of this follow up, medication compliance, and provided [facility] information to the patient as well. I discussed results, treatment plan, need for follow-up, and return precautions with the patient. Provided opportunity for questions, patient confirmed understanding.   Findings and plan of care discussed with supervising physician Dr. Erma Heritage who is in agreement.   Vitals:   01/12/18 2330 01/13/18 0030  BP: (!) 94/55 101/64  Pulse: 91 98  Resp: 20 20  Temp:    SpO2: 99% 99%    Final Clinical Impressions(s) / ED Diagnoses   Final diagnoses:  Weakness    ED Discharge Orders    None       Cherly Anderson, PA-C 01/13/18 0043    Shaune Pollack, MD 01/13/18 1015

## 2018-01-13 ENCOUNTER — Emergency Department (HOSPITAL_COMMUNITY)
Admission: EM | Admit: 2018-01-13 | Discharge: 2018-01-13 | Disposition: A | Discharge status: Home / Self Care | Payer: Medicaid Other | Source: Home / Self Care | Attending: Emergency Medicine | Admitting: Emergency Medicine

## 2018-01-13 ENCOUNTER — Emergency Department (HOSPITAL_COMMUNITY): Payer: Medicaid Other

## 2018-01-13 ENCOUNTER — Other Ambulatory Visit: Payer: Self-pay

## 2018-01-13 ENCOUNTER — Encounter (HOSPITAL_COMMUNITY): Payer: Self-pay | Admitting: Emergency Medicine

## 2018-01-13 DIAGNOSIS — Z7901 Long term (current) use of anticoagulants: Secondary | ICD-10-CM | POA: Insufficient documentation

## 2018-01-13 DIAGNOSIS — Z79899 Other long term (current) drug therapy: Secondary | ICD-10-CM

## 2018-01-13 DIAGNOSIS — F17200 Nicotine dependence, unspecified, uncomplicated: Secondary | ICD-10-CM | POA: Insufficient documentation

## 2018-01-13 DIAGNOSIS — R531 Weakness: Secondary | ICD-10-CM | POA: Insufficient documentation

## 2018-01-13 DIAGNOSIS — R45851 Suicidal ideations: Secondary | ICD-10-CM

## 2018-01-13 DIAGNOSIS — F17210 Nicotine dependence, cigarettes, uncomplicated: Secondary | ICD-10-CM | POA: Insufficient documentation

## 2018-01-13 DIAGNOSIS — F329 Major depressive disorder, single episode, unspecified: Secondary | ICD-10-CM

## 2018-01-13 LAB — BASIC METABOLIC PANEL
Anion gap: 8 (ref 5–15)
BUN: 10 mg/dL (ref 6–20)
CHLORIDE: 101 mmol/L (ref 101–111)
CO2: 26 mmol/L (ref 22–32)
Calcium: 8.3 mg/dL — ABNORMAL LOW (ref 8.9–10.3)
Creatinine, Ser: 0.74 mg/dL (ref 0.61–1.24)
GFR calc Af Amer: >60 mL/min (ref >60)
GFR calc non Af Amer: >60 mL/min (ref >60)
GLUCOSE: 102 mg/dL — AB (ref 65–99)
POTASSIUM: 4.3 mmol/L (ref 3.5–5.1)
SODIUM: 135 mmol/L (ref 135–145)

## 2018-01-13 LAB — CBC
HCT: 26.2 % — ABNORMAL LOW (ref 39.0–52.0)
HEMOGLOBIN: 8.1 g/dL — AB (ref 13.0–17.0)
MCH: 26.8 pg (ref 26.0–34.0)
MCHC: 30.9 g/dL (ref 30.0–36.0)
MCV: 86.8 fL (ref 78.0–100.0)
PLATELETS: 385 10*3/uL (ref 150–400)
RBC: 3.02 MIL/uL — ABNORMAL LOW (ref 4.22–5.81)
RDW: 15.9 % — AB (ref 11.5–15.5)
WBC: 9.8 10*3/uL (ref 4.0–10.5)

## 2018-01-13 LAB — I-STAT TROPONIN, ED: TROPONIN I, POC: 0 ng/mL (ref 0.00–0.08)

## 2018-01-13 MED ORDER — SODIUM CHLORIDE 0.9 % IV BOLUS
1000.0000 mL | Freq: Once | INTRAVENOUS | Status: AC
  Administered 2018-01-13: 1000 mL via INTRAVENOUS

## 2018-01-13 MED ORDER — ENOXAPARIN SODIUM 120 MG/0.8ML ~~LOC~~ SOLN
1.5000 mg/kg | Freq: Once | SUBCUTANEOUS | Status: DC
  Filled 2018-01-13: qty 0.71

## 2018-01-13 NOTE — ED Notes (Signed)
Pt came out to desk stating that doctor told him he would be getting something to eat for him and wanted to know where he is out. Pt informed that we would order him a breakfast tray.  Raynelle Fanning [employer] operational officer made aware to order tray.

## 2018-01-13 NOTE — ED Notes (Signed)
Pt given more sugar packets per his request.

## 2018-01-13 NOTE — ED Notes (Signed)
TTS brought patient 2 bus passes.

## 2018-01-13 NOTE — ED Provider Notes (Signed)
[facility]-EMERGENCY DEPT Provider Note   CSN: [id number] Arrival date & time: 01/13/18  0640     History   Chief Complaint Chief Complaint  Patient presents with  . Medical Clearance    HPI Marcus [name] is a 42 y.o. male.  The history is provided by the patient and medical records. No language interpreter was used.  Mental Health Problem  Presenting symptoms: depression, suicidal thoughts and suicidal threats   Presenting symptoms: no aggressive behavior, no agitation, no hallucinations, no homicidal ideas, no paranoid behavior and no suicide attempt   Degree of incapacity (severity):  Moderate Onset quality:  Gradual Duration:  1 day Timing:  Constant Progression:  Worsening Chronicity:  Recurrent Context: drug abuse   Treatment compliance:  Untreated Relieved by:  Nothing Worsened by:  Nothing Ineffective treatments:  None tried Associated symptoms: no chest pain, no fatigue and no headaches   Risk factors: hx of mental illness and hx of suicide attempts     Past Medical History:  Diagnosis Date  . Hepatitis C     Patient Active Problem List   Diagnosis Date Noted  . Severe episode of recurrent major depressive disorder, with psychotic features (HCC)   . Bipolar 1 disorder, mixed, moderate (HCC) 06/29/2017  . Polysubstance dependence including opioid type drug without complication, episodic abuse (HCC) 07/02/2016    History reviewed. No pertinent surgical history.      Home Medications    Prior to Admission medications   Medication Sig Start Date End Date Taking? Authorizing Provider  levofloxacin (LEVAQUIN) 750 MG tablet Take 750 mg by mouth daily.    [provider]  warfarin (COUMADIN) 5 MG tablet Take 5 mg by mouth daily. 01/06/18   [provider]    Family History History reviewed. No pertinent family history.  Social History Social History   Tobacco Use  . Smoking status: Current Every Day Smoker  .  Smokeless tobacco: Never Used  Substance Use Topics  . Alcohol use: No  . Drug use: Yes    Frequency: 7.0 times per week    Types: Methamphetamines, Heroin    Comment: Daily use where possible     Allergies   Bee venom; Food; Penicillins; and Tylenol [acetaminophen]   Review of Systems Review of Systems  Constitutional: Negative for chills, fatigue and fever.  HENT: Negative for congestion.   Respiratory: Negative for cough, chest tightness, shortness of breath and wheezing.   Cardiovascular: Negative for chest pain.  Gastrointestinal: Negative for constipation, diarrhea, nausea and vomiting.  Genitourinary: Negative for dysuria and flank pain.  Musculoskeletal: Positive for back pain. Negative for neck pain and neck stiffness.  Neurological: Negative for headaches.  Psychiatric/Behavioral: Positive for suicidal ideas. Negative for agitation, hallucinations, homicidal ideas and paranoia.  All other systems reviewed and are negative.    Physical Exam Updated Vital Signs BP 105/60 (BP Location: Left Arm)   Pulse (!) 110   Temp 97.9 F (36.6 C) (Oral)   Resp 18   SpO2 100%   Physical Exam  Constitutional: He is oriented to person, place, and time. He appears well-developed and well-nourished. No distress.  HENT:  Head: Normocephalic.  Nose: Nose normal.  Mouth/Throat: No oropharyngeal exudate.  Eyes: Pupils are equal, round, and reactive to light. Conjunctivae are normal.  Neck: Neck supple.  Cardiovascular: Tachycardia present.  Murmur heard. Pulmonary/Chest: Effort normal. No respiratory distress. He has no wheezes. He exhibits no tenderness.  Abdominal: Soft. There is no tenderness.  Musculoskeletal: He exhibits no edema or tenderness.  Lymphadenopathy:    He has no cervical adenopathy.  Neurological: He is alert and oriented to person, place, and time. No cranial nerve deficit or sensory deficit. He exhibits normal muscle tone.  Skin: Capillary refill takes less  than 2 seconds. He is not diaphoretic. No erythema. No pallor.  Nursing note and vitals reviewed.    ED Treatments / Results  Labs (all labs ordered are listed, but only abnormal results are displayed) Labs Reviewed - No data to display  EKG None  Radiology No results found.  Procedures Procedures (including critical care time)  Medications Ordered in ED Medications - No data to display   Initial Impression / Assessment and Plan / ED Course  I have reviewed the triage vital signs and the nursing notes.  Pertinent labs & imaging results that were available during my care of the patient were reviewed by me and considered in my medical decision making (see chart for details).     Marcus [name] is a 42 y.o. 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  Patient reports that he was seen earlier this evening for generalized weakness and lightheadedness since he was discharged from the hospital last week.  He reports that he is on antibiotics and blood thinners.  During his work-up he was found to be subtherapeutic with his INR however patient refused Lovenox at that time.  Patient's vitals have shown some tachycardia and soft blood pressures which are similar to the recent 5 ED visits since discharge.  Patient's work-up overnight showed slight improved leukocytosis, similar anemia, and mild lactic acidosis.  As his work-up appeared similar to recent visit since discharge, they did not feel patient required admission from medical standpoint. Patient was refusing the Lovenox as he does not want to take another blood thinner.  He reports that he will continue the Coumadin and his antibiotics.  Next  Patient reports that he slept in the emergency room overnight and has  developed suicidal ideation and thoughts.  He reports to me that he has had a history of suicide attempt when he tried to walk out into traffic with his eyes closed several years ago.  He says that his plan at this time is to overdose on drugs and "end it all and go to sleep".  He says that he is currently having suicidal thoughts but is having no homicidal thoughts or hallucinations.  On exam, patient has a loud systolic murmur.  Chest is nontender.  Lungs otherwise clear.  Back is diffusely tender and he reports it is always that way.  Patient has symmetric grip strength and normal plantarflexion in both legs.  Normal sensation throughout.  2+ pulses and radial pulses and and DP and PT pulses.  Abdomen is nontender.  As patient just had a medical work-up several hours ago and was determined to be medically cleared and safe for discharge home, do not feel patient needs repeat medical work-up at this time unless symptoms change.  Patient will however have TTS consulted given the new depression, suicidal ideation with a plan in place to kill himself and a history of attempting so.  TTS consult was placed for further management.  3:00 PM Psychiatry document they feel patient is safe for discharge home with outpatient resources which they  provided.  Patient refused repeat vital signs however given his reassuring work-up within the last 24 hours, patient was felt stable for discharge home.  Patient continued to refuse the anticoagulation shot but says he will take his medications as directed.  Patient understood return precautions for new or worsened symptoms and was discharged after he was psychiatrically cleared.   Final Clinical Impressions(s) / ED Diagnoses   Final diagnoses:  Suicidal ideation    ED Discharge Orders    None      Clinical Impression: 1. Suicidal ideation     Disposition: Discharge  Condition: Good  I have discussed the results, Dx and Tx plan with the pt(& family if  present). He/she/they expressed understanding and agree(s) with the plan. Discharge instructions discussed at great length. Strict return precautions discussed and pt &/or family have verbalized understanding of the instructions. No further questions at time of discharge.    New Prescriptions   No medications on file    Follow Up: Hilbert Corrigan Chales Abrahams, NP [address] [place] [address] [phone]  Follow up Please go see Chales Abrahams at the [facility].     Tegeler, Canary Brim, MD 01/13/18 [phone]

## 2018-01-13 NOTE — ED Triage Notes (Signed)
Patient comes in complaining of "Feeling down." Patient states things aren't going right and he has not been on his medicines for months. Patient seen here last night and has been asleep I the lobby.

## 2018-01-13 NOTE — ED Notes (Signed)
TTS at bedside. 

## 2018-01-13 NOTE — ED Notes (Signed)
Pt refused to sign for discharge 

## 2018-01-13 NOTE — Discharge Instructions (Signed)
You were seen in the emergency department today for generalized weakness.  Your lab work was all reassuring consistent with previous lab work you have had done.  It is extremely important that you take your Coumadin, Levaquin, and all of your medications as prescribed.  Be sure to eat and drink plenty.  [facility].  We have also given you information for the [facility], please follow-up there in the next couple of days.  Return to ER for new or worsening symptoms or any other concerns.

## 2018-01-13 NOTE — BH Assessment (Signed)
[facility] Assessment Progress Note  Per Juanetta Beets, DO, this pt does not require psychiatric hospitalization at this time.  Pt is to be discharged from [facility] with recommendation to follow up with [facility].  This has been included in pt's discharge instructions, along with information regarding supportive services for the homeless.  Pt's nurse has been notified.  Doylene Canning, MA Triage Specialist [phone]

## 2018-01-13 NOTE — ED Notes (Signed)
Provided patient a cranberry juice.

## 2018-01-13 NOTE — ED Notes (Signed)
Called and spoke with TTS about patient's plan of care. Was told they will be discharging patient with resources.  Made pt aware.

## 2018-01-13 NOTE — Discharge Instructions (Addendum)
Continue your current medications, follow up with your doctor as planned

## 2018-01-13 NOTE — ED Notes (Signed)
Pt refusing for this RN to take his vitals. Pt states, " I am done with this hospital, I have been here too long".

## 2018-01-13 NOTE — ED Notes (Signed)
Bed: [facility]PT4 Expected date:  Expected time:  Means of arrival:  Comments: 

## 2018-01-13 NOTE — Discharge Instructions (Signed)
For your mental health needs, you are advised to follow up with [facility].  New and returning patients are seen at their walk-in clinic.  Walk-in hours are Monday - Friday from 8:00 am - 3:00 pm.  Walk-in patients are seen on a first come, first served basis.  Try to arrive as early as possible for he best chance of being seen the same day: ° °     [facility] °     [address] °     [place], [place] [address] °     [phone] ° °For your shelter needs, contact the following service providers: ° °     [facility] (operated by [facility]) °     [address] °     [place], [place] [address] °     [phone] ° °     [facility] °     [address] °     [place], [place] [address] °     [phone] ° °For day shelter and other supportive services for the homeless, contact the [facility] ([facility]): ° °     [facility] °     [address] °     [place], [place] [address] °     [phone] ° °For transitional housing, contact one of the following agencies.  They provide longer term housing than a shelter, but there is an application process: ° °     [facility] °     [address] °     [place], [place] [address] °     [phone] ° °     [facility] °     [facility] °     [address] °     [place], [place] [address] °     [phone] °

## 2018-01-13 NOTE — BH Assessment (Signed)
Assessment Note  Marcus [name] is an 42 y.o. male. Pt reports SI if he has to leave the hospital. Pt denies previous SI attempts. Pt states he is tired of being homeless. Pt denies HI and AVH. Pt has been seen multiple times in the ED. Pt has multiple physical health concerns. Pt denies previous inpatient treatment. Pt denies current mental health treatment and medications.  Dr. Sharma Covert and Jacki Cones, NP recommends D/C. Provided with resources.  Diagnosis:  F32.9 Unspecified depression  Past Medical History:  Past Medical History:  Diagnosis Date  . Hepatitis C     History reviewed. No pertinent surgical history.  Family History: History reviewed. No pertinent family history.  Social History:  reports that he has been smoking.  He has never used smokeless tobacco. He reports that he has current or past drug history. Drugs: Methamphetamines and Heroin. Frequency: 7.00 times per week. He reports that he does not drink alcohol.  Additional Social History:     CIWA: CIWA-Ar BP: 105/60 Pulse Rate: (!) 110 COWS:    Allergies:  Allergies  Allergen Reactions  . Bee Venom Anaphylaxis  . Food Hives and Other (See Comments)    grapefruit  . Penicillins Other (See Comments)    Possible reaction - pt unsure Has patient had a PCN reaction causing immediate rash, facial/tongue/throat swelling, SOB or lightheadedness with hypotension: unk Has patient had a PCN reaction causing severe rash involving mucus membranes or skin necrosis: unk Has patient had a PCN reaction that required hospitalization: unk Has patient had a PCN reaction occurring within the last 10 years: No If all of the above answers are "NO", then may proceed with Cephalosporin use.   . Tylenol [Acetaminophen] Other (See Comments)    Pt states that he is not supposed to take this medication because of his Hep C.     Home Medications:  (Not in a hospital admission)  OB/GYN Status:  No LMP for male patient.  General  Assessment Data Location of Assessment: [facility] TTS Assessment: In system Is this a Tele or Face-to-Face Assessment?: Face-to-Face Is this an Initial Assessment or a Re-assessment for this encounter?: Initial Assessment Marital status: Single Maiden name: NA Is patient pregnant?: No Pregnancy Status: No Living Arrangements: Other (Comment) Can pt return to current living arrangement?: No Admission Status: Voluntary Is patient capable of signing voluntary admission?: Yes Referral Source: Self/Family/Friend Insurance type: SP     Crisis Care Plan Living Arrangements: Other (Comment) Legal Guardian: Other:(self) Name of Psychiatrist: NA Name of Therapist: NA  Education Status Is patient currently in school?: No  Risk to self with the past 6 months Suicidal Ideation: No-Not Currently/Within Last 6 Months(Pt states if he has to leave the hospital he will be suicida) Has patient been a risk to self within the past 6 months prior to admission? : No Suicidal Intent: No Has patient had any suicidal intent within the past 6 months prior to admission? : No Is patient at risk for suicide?: No Suicidal Plan?: No Has patient had any suicidal plan within the past 6 months prior to admission? : No Access to Means: No What has been your use of drugs/alcohol within the last 12 months?: NA Previous Attempts/Gestures: No How many times?: 0 Other Self Harm Risks: NA Triggers for Past Attempts: None known Intentional Self Injurious Behavior: None Family Suicide History: No Recent stressful life event(s): Other (Comment)(homelessness, physical health) Persecutory voices/beliefs?: No Depression: Yes Depression Symptoms: Tearfulness, Isolating, Loss of interest in usual  pleasures, Feeling worthless/self pity, Feeling angry/irritable Substance abuse history and/or treatment for substance abuse?: No Suicide prevention information given to non-admitted patients: Not applicable  Risk to Others  within the past 6 months Homicidal Ideation: No Does patient have any lifetime risk of violence toward others beyond the six months prior to admission? : No Thoughts of Harm to Others: No Current Homicidal Intent: No Current Homicidal Plan: No Access to Homicidal Means: No Identified Victim: NA History of harm to others?: No Assessment of Violence: None Noted Violent Behavior Description: NA Does patient have access to weapons?: No Criminal Charges Pending?: No Does patient have a court date: No Is patient on probation?: No  Psychosis Hallucinations: None noted Delusions: None noted  Mental Status Report Appearance/Hygiene: Unremarkable Eye Contact: Fair Motor Activity: Freedom of movement Speech: Logical/coherent Level of Consciousness: Alert Mood: Depressed Affect: Depressed Anxiety Level: Minimal Thought Processes: Coherent, Relevant Judgement: Unimpaired Orientation: Person, Place, Time, Situation, Appropriate for developmental age Obsessive Compulsive Thoughts/Behaviors: None  Cognitive Functioning Concentration: Normal Memory: Recent Intact, Remote Intact Is patient IDD: No Is patient DD?: No Insight: Fair Impulse Control: Fair Appetite: Fair Have you had any weight changes? : No Change Sleep: No Change Total Hours of Sleep: 8 Vegetative Symptoms: None  ADLScreening [facility]) Patient's cognitive ability adequate to safely complete daily activities?: Yes Patient able to express need for assistance with ADLs?: Yes Independently performs ADLs?: Yes (appropriate for developmental age)  Prior Inpatient Therapy Prior Inpatient Therapy: No  Prior Outpatient Therapy Prior Outpatient Therapy: No Does patient have an ACCT team?: No Does patient have Intensive In-House Services?  : No Does patient have [facility] services? : No Does patient have P4CC services?: No  ADL Screening (condition at time of admission) Patient's cognitive ability  adequate to safely complete daily activities?: Yes Is the patient deaf or have difficulty hearing?: No Does the patient have difficulty seeing, even when wearing glasses/contacts?: No Does the patient have difficulty concentrating, remembering, or making decisions?: No Patient able to express need for assistance with ADLs?: Yes Does the patient have difficulty dressing or bathing?: No Independently performs ADLs?: Yes (appropriate for developmental age)       Abuse/Neglect Assessment (Assessment to be complete while patient is alone) Abuse/Neglect Assessment Can Be Completed: Yes Physical Abuse: Denies Verbal Abuse: Denies Sexual Abuse: Denies Exploitation of patient/patient's resources: Denies     Merchant navy officer (For Healthcare) Does Patient Have a Medical Advance Directive?: No Would patient like information on creating a medical advance directive?: No - Patient declined    Additional Information 1:1 In Past 12 Months?: No CIRT Risk: No Elopement Risk: No Does patient have medical clearance?: Yes     Disposition:  Disposition Initial Assessment Completed for this Encounter: Yes  On Site Evaluation by:   Reviewed with Physician:    Wolfgang Phoenix D 01/13/2018 11:01 AM

## 2018-01-13 NOTE — ED Notes (Signed)
Pt refusing lovenox injection stating he does not want to take a blood thinner on top of the blood thinner he takes now. PA Sam made aware, came into room pt stating since he was refused a bed he will refuse his medication. Pt made aware of the necessity for this medication. Pt states he doesn't care he does not want it.

## 2018-01-13 NOTE — ED Notes (Signed)
ED Provider at bedside. 

## 2018-01-13 NOTE — ED Triage Notes (Signed)
Pt [facility] ED [facility].  Pt was sitting outside and was c/o weakness and dizziness.  Pt was seen for same at [facility] yesterday

## 2018-01-13 NOTE — ED Provider Notes (Signed)
[facility] EMERGENCY DEPARTMENT Provider Note   CSN: [id number] Arrival date & time: 01/13/18  1739     History   Chief Complaint Chief Complaint  Patient presents with  . Dizziness  . Weakness    HPI Marcus [name] is a 42 y.o. male.  HPI Patient presented to the emergency room for evaluation of weakness and dizziness.  Patient was seen yesterday in the emergency room as well as today.  Patient states he feels weak and that he needs to be admitted to the hospital.  Patient states he got off the bus and started to feel weak and dizzy.  He denies any fevers or chills.  No vomiting or diarrhea.  No chest pain or shortness of breath.  Previous medical records were reviewed.  Patient has been seen in various emergency rooms on May 11, May 12, May 13, yesterday and earlier this morning.  Patient was admitted to the hospital at [facility] back on April 30 and the patient left AMA on May 10.  Patient was on heparin and transitioned to Coumadin.  Lovenox was offered to the patient in his ED visits but he refused.  Patient had some symptoms of depression so he was assessed by the psychiatry team at [facility] today.  Notes indicate the patient was stable for discharge. Past Medical History:  Diagnosis Date  . Hepatitis C     Patient Active Problem List   Diagnosis Date Noted  . Severe episode of recurrent major depressive disorder, with psychotic features (HCC)   . Bipolar 1 disorder, mixed, moderate (HCC) 06/29/2017  . Polysubstance dependence including opioid type drug without complication, episodic abuse (HCC) 07/02/2016    Past Surgical History:  Procedure Laterality Date  . APPENDECTOMY          Home Medications    Prior to Admission medications   Medication Sig Start Date End Date Taking? Authorizing Provider  levofloxacin (LEVAQUIN) 750 MG tablet Take 750 mg by mouth daily.    [provider]  warfarin (COUMADIN) 5 MG tablet Take 5 mg by  mouth daily. 01/06/18   [provider]    Family History No family history on file.  Social History Social History   Tobacco Use  . Smoking status: Current Every Day Smoker  . Smokeless tobacco: Never Used  Substance Use Topics  . Alcohol use: No  . Drug use: Yes    Frequency: 7.0 times per week    Types: Methamphetamines, Heroin    Comment: Daily use where possible     Allergies   Bee venom; Food; Penicillins; and Tylenol [acetaminophen]   Review of Systems Review of Systems  All other systems reviewed and are negative.    Physical Exam Updated Vital Signs BP 109/77   Pulse 89   Temp 98.2 F (36.8 C) (Oral)   Resp (!) 25   Ht 1.803 m ( )   Wt 63.5 kg (140 lb)   SpO2 97%   BMI 19.53 kg/m   Physical Exam  Constitutional: No distress.  Disheveled  HENT:  Head: Normocephalic and atraumatic.  Right Ear: External ear normal.  Left Ear: External ear normal.  Eyes: Conjunctivae are normal. Right eye exhibits no discharge. Left eye exhibits no discharge. No scleral icterus.  Neck: Neck supple. No tracheal deviation present.  Cardiovascular: Normal rate, regular rhythm and intact distal pulses.  Pulmonary/Chest: Effort normal and breath sounds normal. No stridor. No respiratory distress. He has no wheezes. He  has no rales.  Abdominal: Soft. Bowel sounds are normal. He exhibits no distension. There is no tenderness. There is no rebound and no guarding.  Musculoskeletal: He exhibits no edema or tenderness.  Neurological: He is alert. He has normal strength. No cranial nerve deficit (no facial droop, extraocular movements intact, no slurred speech) or sensory deficit. He exhibits normal muscle tone. He displays no seizure activity. Coordination normal.  Skin: Skin is warm and dry. No rash noted.  Psychiatric: He has a normal mood and affect.  Nursing note and vitals reviewed.    ED Treatments / Results  Labs (all labs ordered are listed, but only  abnormal results are displayed) Labs Reviewed  CBC - Abnormal; Notable for the following components:      Result Value   RBC 3.02 (*)    Hemoglobin 8.1 (*)    HCT 26.2 (*)    RDW 15.9 (*)    All other components within normal limits  BASIC METABOLIC PANEL - Abnormal; Notable for the following components:   Glucose, Bld 102 (*)    Calcium 8.3 (*)    All other components within normal limits  I-STAT TROPONIN, ED    EKG EKG Interpretation  Date/Time:  Wednesday Jan 13 2018 19:24:19 EDT Ventricular Rate:  88 PR Interval:    QRS Duration: 97 QT Interval:  381 QTC Calculation: 461 R Axis:   20 Text Interpretation:  Sinus rhythm Abnormal R-wave progression, early transition ST elevation, consider inferior injury Early repolarization Confirmed by Linwood Dibbles [phone]) on 01/13/2018 7:37:29 PM   Radiology Dg Chest 2 View  Result Date: 01/13/2018 CLINICAL DATA:  Weakness, hungry, pain.  History of hepatitis-C. EXAM: CHEST - 2 VIEW COMPARISON:  None. FINDINGS: The heart size and mediastinal contours are within normal limits. Both lungs are clear. The visualized skeletal structures are unremarkable. IMPRESSION: Negative. Electronically Signed   By: Awilda Metro M.D.   On: 01/13/2018 19:46    Procedures Procedures (including critical care time)  Medications Ordered in ED Medications  sodium chloride 0.9 % bolus 1,000 mL (1,000 mLs Intravenous New Bag/Given 01/13/18 1931)     Initial Impression / Assessment and Plan / ED Course  I have reviewed the triage vital signs and the nursing notes.  Pertinent labs & imaging results that were available during my care of the patient were reviewed by me and considered in my medical decision making (see chart for details).  Clinical Course as of Jan 14 2104  Wed Jan 13, 2018  2009 CXR negative.   Anemia is stable.  No acute electrolyte abnormalities.   [JK]    Clinical Course User Index [JK] Linwood Dibbles, MD    No acute findings noted in  the patient's work-up today.  His labs are stable.  X-ray does not show pneumonia.  Cardiac enzymes are normal.  Patient was assessed by psychiatry earlier in the day.  No indications for psychiatric hospitalization.  Encouraged patient to follow-up with his doctor at [facility].   Final Clinical Impressions(s) / ED Diagnoses   Final diagnoses:  Weakness    ED Discharge Orders    None       Linwood Dibbles, MD 01/13/18 2108

## 2018-01-13 NOTE — ED Notes (Signed)
Pt asking for bus pass. Informed him that I would bring him one once I got his discharge paperwork. Pt states that is taking to long, so he was going to call ambulance. Informed patient that since he was at the Emergency Department ambulance would not come pick him up. Called TTS to see how much longer for discharge.  Was instructed he was good to go just need to print AVS.

## 2018-01-14 ENCOUNTER — Emergency Department (HOSPITAL_COMMUNITY): Payer: Medicaid Other

## 2018-01-14 ENCOUNTER — Encounter (HOSPITAL_COMMUNITY): Payer: Self-pay | Admitting: Emergency Medicine

## 2018-01-14 ENCOUNTER — Other Ambulatory Visit: Payer: Self-pay

## 2018-01-14 ENCOUNTER — Inpatient Hospital Stay (HOSPITAL_COMMUNITY): Payer: Medicaid Other

## 2018-01-14 ENCOUNTER — Inpatient Hospital Stay (HOSPITAL_COMMUNITY)
Admission: EM | Admit: 2018-01-14 | Discharge: 2018-02-09 | DRG: 288 | Disposition: A | Discharge status: Home / Self Care | Payer: Medicaid Other | Attending: Internal Medicine | Admitting: Internal Medicine

## 2018-01-14 ENCOUNTER — Emergency Department (HOSPITAL_COMMUNITY)
Admission: EM | Admit: 2018-01-14 | Discharge: 2018-01-14 | Disposition: A | Discharge status: Home / Self Care | Payer: Medicaid Other | Source: Home / Self Care | Attending: Emergency Medicine | Admitting: Emergency Medicine

## 2018-01-14 ENCOUNTER — Emergency Department (HOSPITAL_BASED_OUTPATIENT_CLINIC_OR_DEPARTMENT_OTHER): Payer: Medicaid Other

## 2018-01-14 DIAGNOSIS — M5489 Other dorsalgia: Secondary | ICD-10-CM | POA: Insufficient documentation

## 2018-01-14 DIAGNOSIS — I745 Embolism and thrombosis of iliac artery: Secondary | ICD-10-CM | POA: Diagnosis present

## 2018-01-14 DIAGNOSIS — L89150 Pressure ulcer of sacral region, unstageable: Secondary | ICD-10-CM | POA: Diagnosis not present

## 2018-01-14 DIAGNOSIS — I058 Other rheumatic mitral valve diseases: Secondary | ICD-10-CM | POA: Insufficient documentation

## 2018-01-14 DIAGNOSIS — G8929 Other chronic pain: Secondary | ICD-10-CM

## 2018-01-14 DIAGNOSIS — M549 Dorsalgia, unspecified: Secondary | ICD-10-CM | POA: Diagnosis present

## 2018-01-14 DIAGNOSIS — F1120 Opioid dependence, uncomplicated: Secondary | ICD-10-CM | POA: Diagnosis present

## 2018-01-14 DIAGNOSIS — J154 Pneumonia due to other streptococci: Secondary | ICD-10-CM | POA: Diagnosis present

## 2018-01-14 DIAGNOSIS — I330 Acute and subacute infective endocarditis: Secondary | ICD-10-CM | POA: Diagnosis present

## 2018-01-14 DIAGNOSIS — I059 Rheumatic mitral valve disease, unspecified: Secondary | ICD-10-CM | POA: Insufficient documentation

## 2018-01-14 DIAGNOSIS — I611 Nontraumatic intracerebral hemorrhage in hemisphere, cortical: Secondary | ICD-10-CM

## 2018-01-14 DIAGNOSIS — F17200 Nicotine dependence, unspecified, uncomplicated: Secondary | ICD-10-CM

## 2018-01-14 DIAGNOSIS — I7410 Embolism and thrombosis of unspecified parts of aorta: Secondary | ICD-10-CM

## 2018-01-14 DIAGNOSIS — B181 Chronic viral hepatitis B without delta-agent: Secondary | ICD-10-CM

## 2018-01-14 DIAGNOSIS — I7409 Other arterial embolism and thrombosis of abdominal aorta: Secondary | ICD-10-CM | POA: Diagnosis present

## 2018-01-14 DIAGNOSIS — G9340 Encephalopathy, unspecified: Secondary | ICD-10-CM | POA: Diagnosis present

## 2018-01-14 DIAGNOSIS — I340 Nonrheumatic mitral (valve) insufficiency: Secondary | ICD-10-CM | POA: Diagnosis present

## 2018-01-14 DIAGNOSIS — B182 Chronic viral hepatitis C: Secondary | ICD-10-CM | POA: Diagnosis present

## 2018-01-14 DIAGNOSIS — E440 Moderate protein-calorie malnutrition: Secondary | ICD-10-CM | POA: Diagnosis present

## 2018-01-14 DIAGNOSIS — F3189 Other bipolar disorder: Secondary | ICD-10-CM | POA: Diagnosis present

## 2018-01-14 DIAGNOSIS — Z7901 Long term (current) use of anticoagulants: Secondary | ICD-10-CM

## 2018-01-14 DIAGNOSIS — F1590 Other stimulant use, unspecified, uncomplicated: Secondary | ICD-10-CM | POA: Diagnosis present

## 2018-01-14 DIAGNOSIS — R402362 Coma scale, best motor response, obeys commands, at arrival to emergency department: Secondary | ICD-10-CM | POA: Diagnosis present

## 2018-01-14 DIAGNOSIS — I76 Septic arterial embolism: Secondary | ICD-10-CM | POA: Diagnosis present

## 2018-01-14 DIAGNOSIS — I719 Aortic aneurysm of unspecified site, without rupture: Secondary | ICD-10-CM | POA: Diagnosis present

## 2018-01-14 DIAGNOSIS — R079 Chest pain, unspecified: Secondary | ICD-10-CM | POA: Diagnosis present

## 2018-01-14 DIAGNOSIS — Z681 Body mass index (BMI) 19 or less, adult: Secondary | ICD-10-CM

## 2018-01-14 DIAGNOSIS — F17210 Nicotine dependence, cigarettes, uncomplicated: Secondary | ICD-10-CM | POA: Diagnosis present

## 2018-01-14 DIAGNOSIS — F32A Depression, unspecified: Secondary | ICD-10-CM

## 2018-01-14 DIAGNOSIS — R45851 Suicidal ideations: Secondary | ICD-10-CM | POA: Diagnosis present

## 2018-01-14 DIAGNOSIS — I361 Nonrheumatic tricuspid (valve) insufficiency: Secondary | ICD-10-CM

## 2018-01-14 DIAGNOSIS — R29700 NIHSS score 0: Secondary | ICD-10-CM | POA: Diagnosis present

## 2018-01-14 DIAGNOSIS — F4310 Post-traumatic stress disorder, unspecified: Secondary | ICD-10-CM | POA: Diagnosis present

## 2018-01-14 DIAGNOSIS — Z880 Allergy status to penicillin: Secondary | ICD-10-CM

## 2018-01-14 DIAGNOSIS — R402142 Coma scale, eyes open, spontaneous, at arrival to emergency department: Secondary | ICD-10-CM | POA: Diagnosis present

## 2018-01-14 DIAGNOSIS — D638 Anemia in other chronic diseases classified elsewhere: Secondary | ICD-10-CM | POA: Diagnosis present

## 2018-01-14 DIAGNOSIS — Z79899 Other long term (current) drug therapy: Secondary | ICD-10-CM

## 2018-01-14 DIAGNOSIS — Z515 Encounter for palliative care: Secondary | ICD-10-CM | POA: Diagnosis present

## 2018-01-14 DIAGNOSIS — N280 Ischemia and infarction of kidney: Secondary | ICD-10-CM | POA: Diagnosis present

## 2018-01-14 DIAGNOSIS — K5900 Constipation, unspecified: Secondary | ICD-10-CM | POA: Diagnosis not present

## 2018-01-14 DIAGNOSIS — Z91030 Bee allergy status: Secondary | ICD-10-CM

## 2018-01-14 DIAGNOSIS — I729 Aneurysm of unspecified site: Secondary | ICD-10-CM

## 2018-01-14 DIAGNOSIS — Z7189 Other specified counseling: Secondary | ICD-10-CM

## 2018-01-14 DIAGNOSIS — R402252 Coma scale, best verbal response, oriented, at arrival to emergency department: Secondary | ICD-10-CM | POA: Diagnosis present

## 2018-01-14 DIAGNOSIS — Z66 Do not resuscitate: Secondary | ICD-10-CM | POA: Diagnosis present

## 2018-01-14 DIAGNOSIS — I639 Cerebral infarction, unspecified: Secondary | ICD-10-CM

## 2018-01-14 DIAGNOSIS — F329 Major depressive disorder, single episode, unspecified: Secondary | ICD-10-CM

## 2018-01-14 DIAGNOSIS — R0602 Shortness of breath: Secondary | ICD-10-CM

## 2018-01-14 DIAGNOSIS — Z91018 Allergy to other foods: Secondary | ICD-10-CM

## 2018-01-14 DIAGNOSIS — I6340 Cerebral infarction due to embolism of unspecified cerebral artery: Secondary | ICD-10-CM

## 2018-01-14 DIAGNOSIS — Z590 Homelessness: Secondary | ICD-10-CM

## 2018-01-14 DIAGNOSIS — I63411 Cerebral infarction due to embolism of right middle cerebral artery: Secondary | ICD-10-CM | POA: Diagnosis present

## 2018-01-14 DIAGNOSIS — L8990 Pressure ulcer of unspecified site, unspecified stage: Secondary | ICD-10-CM

## 2018-01-14 DIAGNOSIS — I700 Atherosclerosis of aorta: Secondary | ICD-10-CM

## 2018-01-14 DIAGNOSIS — Z888 Allergy status to other drugs, medicaments and biological substances status: Secondary | ICD-10-CM

## 2018-01-14 HISTORY — DX: Infarction of spleen: D73.5

## 2018-01-14 HISTORY — DX: Unspecified streptococcus as the cause of diseases classified elsewhere: B95.5

## 2018-01-14 HISTORY — DX: Acute and subacute infective endocarditis: I33.0

## 2018-01-14 HISTORY — DX: Unspecified viral hepatitis B without hepatic coma: B19.10

## 2018-01-14 HISTORY — DX: Embolism and thrombosis of unspecified parts of aorta: I74.10

## 2018-01-14 HISTORY — DX: Ischemia and infarction of kidney: N28.0

## 2018-01-14 LAB — DIC (DISSEMINATED INTRAVASCULAR COAGULATION) PANEL
FIBRINOGEN: 536 mg/dL — AB (ref 210–475)
PROTHROMBIN TIME: 21.4 s — AB (ref 11.4–15.2)
SMEAR REVIEW: NONE SEEN

## 2018-01-14 LAB — HEPATIC FUNCTION PANEL
ALK PHOS: 57 U/L (ref 38–126)
ALT: 32 U/L (ref 17–63)
AST: 24 U/L (ref 15–41)
Albumin: 1.8 g/dL — ABNORMAL LOW (ref 3.5–5.0)
BILIRUBIN DIRECT: 0.1 mg/dL (ref 0.1–0.5)
BILIRUBIN INDIRECT: 0.5 mg/dL (ref 0.3–0.9)
Total Bilirubin: 0.6 mg/dL (ref 0.3–1.2)
Total Protein: 6.8 g/dL (ref 6.5–8.1)

## 2018-01-14 LAB — CBC
HEMATOCRIT: 27.3 % — AB (ref 39.0–52.0)
Hemoglobin: 8.2 g/dL — ABNORMAL LOW (ref 13.0–17.0)
MCH: 26.1 pg (ref 26.0–34.0)
MCHC: 30 g/dL (ref 30.0–36.0)
MCV: 86.9 fL (ref 78.0–100.0)
PLATELETS: 410 10*3/uL — AB (ref 150–400)
RBC: 3.14 MIL/uL — ABNORMAL LOW (ref 4.22–5.81)
RDW: 16.2 % — AB (ref 11.5–15.5)
WBC: 11.9 10*3/uL — AB (ref 4.0–10.5)

## 2018-01-14 LAB — BASIC METABOLIC PANEL
Anion gap: 8 (ref 5–15)
BUN: 8 mg/dL (ref 6–20)
CO2: 26 mmol/L (ref 22–32)
CREATININE: 0.71 mg/dL (ref 0.61–1.24)
Calcium: 8.4 mg/dL — ABNORMAL LOW (ref 8.9–10.3)
Chloride: 100 mmol/L — ABNORMAL LOW (ref 101–111)
GFR calc Af Amer: >60 mL/min (ref >60)
GLUCOSE: 103 mg/dL — AB (ref 65–99)
POTASSIUM: 3.8 mmol/L (ref 3.5–5.1)
SODIUM: 134 mmol/L — AB (ref 135–145)

## 2018-01-14 LAB — AMMONIA: Ammonia: 12 umol/L (ref 9–35)

## 2018-01-14 LAB — MRSA PCR SCREENING: MRSA by PCR: POSITIVE — AB

## 2018-01-14 LAB — ECHOCARDIOGRAM COMPLETE
Height: 71 in
WEIGHTICAEL: 2240 [oz_av]

## 2018-01-14 LAB — DIC (DISSEMINATED INTRAVASCULAR COAGULATION)PANEL
D-Dimer, Quant: 2.14 ug/mL-FEU — ABNORMAL HIGH (ref 0.00–0.50)
INR: 1.87
Platelets: 431 10*3/uL — ABNORMAL HIGH (ref 150–400)
aPTT: 43 seconds — ABNORMAL HIGH (ref 24–36)

## 2018-01-14 LAB — RAPID URINE DRUG SCREEN, HOSP PERFORMED
Amphetamines: NOT DETECTED
BENZODIAZEPINES: NOT DETECTED
Barbiturates: NOT DETECTED
COCAINE: NOT DETECTED
OPIATES: POSITIVE — AB
Tetrahydrocannabinol: NOT DETECTED

## 2018-01-14 LAB — I-STAT VENOUS BLOOD GAS, ED
Acid-Base Excess: 3 mmol/L — ABNORMAL HIGH (ref 0.0–2.0)
Bicarbonate: 26.4 mmol/L (ref 20.0–28.0)
O2 Saturation: 47 %
PCO2 VEN: 36.9 mmHg — AB (ref 44.0–60.0)
PO2 VEN: 24 mmHg — AB (ref 32.0–45.0)
TCO2: 27 mmol/L (ref 22–32)
pH, Ven: 7.462 — ABNORMAL HIGH (ref 7.250–7.430)

## 2018-01-14 LAB — I-STAT TROPONIN, ED: Troponin i, poc: 0 ng/mL (ref 0.00–0.08)

## 2018-01-14 LAB — ETHANOL: Alcohol, Ethyl (B): <10 mg/dL (ref <10)

## 2018-01-14 LAB — I-STAT CG4 LACTIC ACID, ED: Lactic Acid, Venous: 0.83 mmol/L (ref 0.5–1.9)

## 2018-01-14 LAB — PROTIME-INR
INR: 1.76
PROTHROMBIN TIME: 20.3 s — AB (ref 11.4–15.2)

## 2018-01-14 MED ORDER — STROKE: EARLY STAGES OF RECOVERY BOOK
Freq: Once | Status: AC
  Administered 2018-01-14: 12:00:00
  Filled 2018-01-14: qty 1

## 2018-01-14 MED ORDER — SODIUM CHLORIDE 0.9 % IV SOLN
INTRAVENOUS | Status: DC
  Administered 2018-01-15: 1000 mL via INTRAVENOUS
  Administered 2018-01-16 (×2): via INTRAVENOUS

## 2018-01-14 MED ORDER — IOPAMIDOL (ISOVUE-370) INJECTION 76%
INTRAVENOUS | Status: AC
  Administered 2018-01-21: 100 mL via INTRAVENOUS
  Filled 2018-01-14: qty 100

## 2018-01-14 MED ORDER — VANCOMYCIN HCL IN DEXTROSE 1-5 GM/200ML-% IV SOLN
1000.0000 mg | Freq: Three times a day (TID) | INTRAVENOUS | Status: DC
  Administered 2018-01-14 – 2018-01-18 (×11): 1000 mg via INTRAVENOUS
  Filled 2018-01-14 (×13): qty 200

## 2018-01-14 MED ORDER — FAMOTIDINE IN NACL 20-0.9 MG/50ML-% IV SOLN
20.0000 mg | Freq: Two times a day (BID) | INTRAVENOUS | Status: DC
  Administered 2018-01-14 – 2018-01-15 (×3): 20 mg via INTRAVENOUS
  Filled 2018-01-14 (×4): qty 50

## 2018-01-14 MED ORDER — PIPERACILLIN-TAZOBACTAM 3.375 G IVPB
3.3750 g | Freq: Three times a day (TID) | INTRAVENOUS | Status: DC
  Administered 2018-01-14 – 2018-01-16 (×6): 3.375 g via INTRAVENOUS
  Filled 2018-01-14 (×7): qty 50

## 2018-01-14 MED ORDER — STROKE: EARLY STAGES OF RECOVERY BOOK
Freq: Once | Status: DC
  Filled 2018-01-14: qty 1

## 2018-01-14 MED ORDER — SENNOSIDES-DOCUSATE SODIUM 8.6-50 MG PO TABS
1.0000 | ORAL_TABLET | Freq: Two times a day (BID) | ORAL | Status: DC
  Administered 2018-01-14 – 2018-02-01 (×11): 1 via ORAL
  Filled 2018-01-14 (×27): qty 1

## 2018-01-14 MED ORDER — PIPERACILLIN-TAZOBACTAM 3.375 G IVPB 30 MIN
3.3750 g | Freq: Once | INTRAVENOUS | Status: AC
  Administered 2018-01-14: 3.375 g via INTRAVENOUS
  Filled 2018-01-14: qty 50

## 2018-01-14 MED ORDER — IOPAMIDOL (ISOVUE-370) INJECTION 76%
100.0000 mL | Freq: Once | INTRAVENOUS | Status: AC | PRN
  Administered 2018-01-21: 100 mL via INTRAVENOUS

## 2018-01-14 MED ORDER — SODIUM CHLORIDE 0.9 % IV SOLN
250.0000 mL | INTRAVENOUS | Status: DC | PRN
  Administered 2018-01-15: 250 mL via INTRAVENOUS

## 2018-01-14 MED ORDER — ACETAMINOPHEN 500 MG PO TABS
1000.0000 mg | ORAL_TABLET | Freq: Once | ORAL | Status: AC
  Administered 2018-01-14: 1000 mg via ORAL
  Filled 2018-01-14: qty 2

## 2018-01-14 MED ORDER — SODIUM CHLORIDE 0.9 % IV BOLUS
1000.0000 mL | Freq: Once | INTRAVENOUS | Status: AC
  Administered 2018-01-14: 1000 mL via INTRAVENOUS

## 2018-01-14 MED ORDER — VANCOMYCIN HCL 10 G IV SOLR
1250.0000 mg | Freq: Once | INTRAVENOUS | Status: AC
  Administered 2018-01-14: 1250 mg via INTRAVENOUS
  Filled 2018-01-14: qty 1250

## 2018-01-14 NOTE — H&P (Signed)
PULMONARY / CRITICAL CARE MEDICINE   Name: Marcus [name] MRN: [id number] DOB: [birth year]    ADMISSION DATE:  01/14/2018     CHIEF COMPLAINT: Patient come  with exacerbation of his back pain. He has chronic back pain since an injury a few years ago snowborading    HISTORY OF PRESENT ILLNESS:  The patient presented with altered mental status as well. He has become mopre awake the longer he is in the ED   he patient was discharge < 1day ago from [facility]. The patient is 42 yo homeless man. 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 The patient had a vegetationon theposterior leaflet of his mitral valve. 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 He ha been on anticoagualtion because of that with coumadin. Imaging alsop has shown multiple splenic and bilateral kidney infarcts. His INR today was 1.57.   PAST MEDICAL HISTORY :  He  has a past medical history of Hepatitis C.  No known history of HIV  PAST SURGICAL HISTORY: He  has a past surgical history that includes Appendectomy.  Allergies  Allergen Reactions  . Bee Venom Anaphylaxis  . Food Hives and Other (See Comments)    grapefruit  . Penicillins Other (See Comments)    Possible reaction - pt unsure Has patient had a PCN reaction causing immediate rash, facial/tongue/throat swelling, SOB or lightheadedness with hypotension: unk Has patient had a PCN reaction causing severe rash involving mucus membranes or skin necrosis: unk Has patient had a PCN reaction that required hospitalization: unk Has patient had a PCN reaction occurring within the last 10 years: No If all of the above answers are "NO", then may proceed with Cephalosporin use.   . Tylenol [Acetaminophen] Other (See Comments)    Pt states that he is not supposed to take this medication because of his Hep C.     No current facility-administered medications  on file prior to encounter.    Current Outpatient Medications on File Prior to Encounter  Medication Sig  . levofloxacin (LEVAQUIN) 750 MG tablet Take 750 mg by mouth daily.  Marland Kitchen MELATONIN PO Take 2 tablets by mouth daily as needed (Sleep).  . warfarin (COUMADIN) 5 MG tablet Take 5 mg by mouth daily.    FAMILY HISTORY:  His has no family status information on file.    SOCIAL HISTORY: He  reports that he has been smoking.  He has never used smokeless tobacco. He reports that he has current or past drug history. Drugs: Methamphetamines and Heroin. Frequency: 7.00 times per week. He reports that he does not drink alcohol.  REVIEW OF SYSTEMS:    Review of Systems  Constitutional: Positive for malaise/fatigue. Negative for chills and fever.  HENT: Negative.   Eyes: Negative.   Respiratory: Positive for cough and sputum production. Negative for hemoptysis and shortness of breath.   Cardiovascular: Positive for chest pain. Negative for leg swelling and PND.  Gastrointestinal: Negative.   Genitourinary: Negative.   Musculoskeletal: Positive for back pain.  Neurological: Positive for headaches. Negative for dizziness, focal weakness and seizures.  Endo/Heme/Allergies: Negative.   Psychiatric/Behavioral: Positive for substance abuse.      VITAL SIGNS: BP 103/67   Pulse 84   Temp (!) 97.5 F (36.4 C) (Oral)   Resp 18   SpO2 100%     INTAKE / OUTPUT: No intake/output data recorded.  PHYSICAL EXAMINATION: General:  Young, unkempt male in ED Neuro:  Patient was lethargic and barely arousable iniitally but is fully awake presently. He is speaking claearly moving all extr. HEENT:  [place]/AT Cardiovascular: RRR S1S2, No clear cut murmur Lungs:  Bilateral BS, no wheeze or rales Abdomen: soft, BS, non-tende, no gross organomegaly Musculoskeletal: unremarkable    LABS:  BMET Recent Labs  Lab 01/12/18 1938 01/13/18 1832 01/14/18 0744  NA 133* 135 134*  K 3.7 4.3 3.8  CL 102 101  100*  CO2 21* 26 26  BUN CREATININE 0.64 0.74 0.71  GLUCOSE 99 102* 103*    Electrolytes Recent Labs  Lab 01/12/18 1938 01/13/18 1832 01/14/18 0744  CALCIUM 8.1* 8.3* 8.4*    CBC Recent Labs  Lab 01/12/18 1938 01/13/18 1832 01/14/18 0744 01/14/18 1105  WBC 11.2* 9.8 11.9*  --   HGB 8.1* 8.1* 8.2*  --   HCT 26.3* 26.2* 27.3*  --   PLT 338 385 410* 431*    Coag's Recent Labs  Lab 01/12/18 2259 01/14/18 0904 01/14/18 1105  APTT  --   --  43*  INR 1.57 1.76 1.87    Sepsis Markers Recent Labs  Lab 01/14/18 1031  LATICACIDVEN 0.83    ABG No results for input(s): PHART, PCO2ART, PO2ART in the last 168 hours.  Liver Enzymes Recent Labs  Lab 01/14/18 1006  AST 24  ALT 32  ALKPHOS 57  BILITOT 0.6  ALBUMIN 1.8*    Cardiac Enzymes No results for input(s): TROPONINI, PROBNP in the last 168 hours.  Glucose Recent Labs  Lab 01/12/18 1937  GLUCAP 92    Imaging Dg Chest 2 View  Result Date: 01/14/2018 CLINICAL DATA:  Left chest pain EXAM: CHEST - 2 VIEW COMPARISON:  01/13/2018 FINDINGS: Heart and mediastinal contours are within normal limits. No focal opacities or effusions. No acute bony abnormality. IMPRESSION: No active cardiopulmonary disease. Electronically Signed   By: Charlett Nose M.D.   On: 01/14/2018 08:29   Dg Chest 2 View  Result Date: 01/13/2018 CLINICAL DATA:  Weakness, hungry, pain.  History of hepatitis-C. EXAM: CHEST - 2 VIEW COMPARISON:  None. FINDINGS: The heart size and mediastinal contours are within normal limits. Both lungs are clear. The visualized skeletal structures are unremarkable. IMPRESSION: Negative. Electronically Signed   By: Awilda Metro M.D.   On: 01/13/2018 19:46   Ct Head Wo Contrast  Addendum Date: 01/14/2018   ADDENDUM REPORT: 01/14/2018 10:35 ADDENDUM: Study discussed by telephone with Dr. Shaune Pollack on 01/14/2018 at 1015 hours. 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 THEREFORE, the  appearance is highly likely to reflect SEPTIC EMBOLI, with acute embolic related hemorrhage in the right superior frontal lobe. Electronically Signed   By: Odessa Fleming M.D.   On: 01/14/2018 10:35   Result Date: 01/14/2018 CLINICAL DATA:  42 year old male with altered level consciousness, lightheadedness and dizzy onset about 1 hour ago. EXAM: CT HEAD WITHOUT CONTRAST TECHNIQUE: Contiguous axial images were obtained from the base of the skull through the vertex without intravenous contrast. COMPARISON:  None. FINDINGS: Brain: Cytotoxic edema at the right anterior insula and frontal operculum. No associated hemorrhage or mass effect in those segments. Small amorphous 7 millimeter focus of hyperdensity at the right superior frontal gyrus on series 3, image 25 and sagittal image 26. On coronal images there is adjacent hypodensity (coronal image 34). No regional mass effect. No other cytotoxic edema identified. ASPECTS 7 (abnormal insula,  M4, M 5). More chronic appearing left corona radiata white matter hypodensity on the left. No intracranial mass effect or midline shift. No ventriculomegaly. Normal basilar cisterns. Vascular: Abnormal cylindrical hyperdensity at the right MCA proximal M2 segment with Vessel expansion (series 3, image 14 and series 6, image 21). Skull: No acute osseous abnormality identified. Sinuses/Orbits: Visualized paranasal sinuses and mastoids are well pneumatized. Other: Mildly Disconjugate gaze, otherwise negative orbits soft tissues. No acute scalp soft tissue findings, scattered benign appearing scalp dermal nodularity. IMPRESSION: 1. 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 2. Possible trace focus of acute hemorrhage in the right superior frontal gyrus on series 3, image 25. No other acute intracranial hemorrhage identified. 3. Right MCA territory cytotoxic edema, ASPECTS 7 (although note suspicion of trace acute hemorrhage in #2). 4. 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 Electronically Signed: By: Odessa Fleming M.D. On: 01/14/2018 10:18   Mr Maxine Glenn Head Wo Contrast  Result Date: 01/14/2018 CLINICAL DATA:  In hospital for sepsis, altered mental status. History of hepatitis-C, polysubstance abuse and endocarditis. History of head injury 1 month ago. Follow-up RIGHT MCA stroke. EXAM: MRI HEAD WITHOUT CONTRAST MRA HEAD WITHOUT CONTRAST TECHNIQUE: Multiplanar, multiecho pulse sequences of the brain and surrounding structures were obtained without intravenous contrast. Angiographic images of the head were obtained using MRA technique without contrast. COMPARISON:  CT HEAD Jan 14, 2018 at 0951 hours. FINDINGS: MRI HEAD FINDINGS Multiple sequences are mildly or moderately motion degraded. INTRACRANIAL CONTENTS: Patchy reduced diffusion RIGHT frontal lobe and RIGHT basal ganglia, LEFT posterior frontal lobe with normalized ADC values and focal T2 shine through. Associated FLAIR T2 hyperintense signal and faint hemosiderin staining. Slightly expanded FLAIR T2 hyperintense RIGHT basal ganglia. Superimposed focal susceptibility artifact bifrontal lobes, RIGHT parietooccipital lobe. Multiple additional scattered micro hemorrhages. Mild parenchymal brain volume loss. No hydrocephalus. Old small RIGHT cerebellar infarcts. VASCULAR: Susceptibility artifact and T1 shortening RIGHT sylvian fissure corresponding to MCA density. SKULL AND UPPER CERVICAL SPINE: No abnormal sellar expansion. No suspicious calvarial bone marrow signal. Craniocervical junction maintained. SINUSES/ORBITS: The mastoid air-cells and included paranasal sinuses are well-aerated.The included ocular globes and orbital contents are non-suspicious. OTHER: None. MRA HEAD FINDINGS ANTERIOR CIRCULATION: Normal flow related enhancement of the included cervical, petrous, cavernous and supraclinoid internal carotid arteries. Patent anterior communicating artery. Mildly attenuated RIGHT MCA flow related enhancement, occluded  RIGHT M2 segment with transient aneurysmal reconstitution. No flow limiting stenosis. POSTERIOR CIRCULATION: vertebral artery is dominant. Basilar artery is patent, with normal flow related enhancement of the main branch vessels. Patent posterior cerebral arteries. Robust RIGHT and smaller LEFT posterior communicating arteries present. No large vessel occlusion, flow limiting stenosis,  aneurysm. ANATOMIC VARIANTS: None. Source images and MIP images were reviewed. IMPRESSION: MRI HEAD: 1. Multifocal subacute bilateral MCA/watershed territory infarcts. 2. Peripheral susceptibility artifact, septic emboli suspected. There may be superimposed shear injury or underlying chronic hypertension. 3. Mild parenchymal brain volume loss for age. 4. Old small RIGHT cerebellar infarcts. MRA HEAD: 1. 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 2. Otherwise negative MRA head. Examination reviewed by Dr. Augusto Gamble with Dr. Wilford Corner, Neurology on Jan 14, 2018 at approximately 1425 hours. Electronically Signed   By: Awilda Metro M.D.   On: 01/14/2018 14:38   Mr Brain Wo Contrast  Result Date: 01/14/2018 CLINICAL DATA:  In hospital for sepsis, altered mental status. History of hepatitis-C, polysubstance abuse and endocarditis. History of head  injury 1 month ago. Follow-up RIGHT MCA stroke. EXAM: MRI HEAD WITHOUT CONTRAST MRA HEAD WITHOUT CONTRAST TECHNIQUE: Multiplanar, multiecho pulse sequences of the brain and surrounding structures were obtained without intravenous contrast. Angiographic images of the head were obtained using MRA technique without contrast. COMPARISON:  CT HEAD Jan 14, 2018 at 0951 hours. FINDINGS: MRI HEAD FINDINGS Multiple sequences are mildly or moderately motion degraded. INTRACRANIAL CONTENTS: Patchy reduced diffusion RIGHT frontal lobe and RIGHT basal ganglia, LEFT posterior frontal lobe with normalized ADC  values and focal T2 shine through. Associated FLAIR T2 hyperintense signal and faint hemosiderin staining. Slightly expanded FLAIR T2 hyperintense RIGHT basal ganglia. Superimposed focal susceptibility artifact bifrontal lobes, RIGHT parietooccipital lobe. Multiple additional scattered micro hemorrhages. Mild parenchymal brain volume loss. No hydrocephalus. Old small RIGHT cerebellar infarcts. VASCULAR: Susceptibility artifact and T1 shortening RIGHT sylvian fissure corresponding to MCA density. SKULL AND UPPER CERVICAL SPINE: No abnormal sellar expansion. No suspicious calvarial bone marrow signal. Craniocervical junction maintained. SINUSES/ORBITS: The mastoid air-cells and included paranasal sinuses are well-aerated.The included ocular globes and orbital contents are non-suspicious. OTHER: None. MRA HEAD FINDINGS ANTERIOR CIRCULATION: Normal flow related enhancement of the included cervical, petrous, cavernous and supraclinoid internal carotid arteries. Patent anterior communicating artery. Mildly attenuated RIGHT MCA flow related enhancement, occluded RIGHT M2 segment with transient aneurysmal reconstitution. No flow limiting stenosis. POSTERIOR CIRCULATION: vertebral artery is dominant. Basilar artery is patent, with normal flow related enhancement of the main branch vessels. Patent posterior cerebral arteries. Robust RIGHT and smaller LEFT posterior communicating arteries present. No large vessel occlusion, flow limiting stenosis,  aneurysm. ANATOMIC VARIANTS: None. Source images and MIP images were reviewed. IMPRESSION: MRI HEAD: 1. Multifocal subacute bilateral MCA/watershed territory infarcts. 2. Peripheral susceptibility artifact, septic emboli suspected. There may be superimposed shear injury or underlying chronic hypertension. 3. Mild parenchymal brain volume loss for age. 4. Old small RIGHT cerebellar infarcts. MRA HEAD: 1. 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 2. Otherwise negative MRA head. Examination reviewed by Dr. Augusto Gamble with Dr. Wilford Corner, Neurology on Jan 14, 2018 at approximately 1425 hours. Electronically Signed   By: Awilda Metro M.D.   On: 01/14/2018 14:38    ECHO  01/14/18 Study Conclusions  - Left ventricle: The cavity size was severely dilated. Systolic   function was normal. The estimated ejection fraction was in the   range of 55% to 60%. Wall motion was normal; there were no   regional wall motion abnormalities. Left ventricular diastolic   function parameters were normal. - Aortic valve: Transvalvular velocity was within the normal range.   There was no stenosis. There was no regurgitation. - Mitral valve: There was a medium-sized, 1.4 cm (L) x 0.4 cm (W),   mobile vegetation on the posterior leaflet. There was severe   regurgitation directed posteriorly. - Left atrium: The atrium was severely dilated. - Right ventricle: The cavity size was normal. Wall thickness was   normal. Systolic function was normal. - Tricuspid valve: There was mild regurgitation. - Pulmonary arteries: Systolic pressure was mildly increased. PA   peak pressure: 42 mm Hg (S).  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 Consider TEE to better   evaluate if clinically indicated    ASSESSMENT / PLAN: Abnormal MRI Head The patient has at least 2 large areas in the brain that appear to be septic emboli. One is each of  the hemispheres. Neurology has been asked to weigh in. The patient is fully awke presently and does not appear to have any grosss focal deficit at this time    CARDIOVASCULAR  Echo shows1.4 cm x 4mm posterior valve vegetation. There is failry significant  MR as well. 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

## 2018-01-14 NOTE — ED Triage Notes (Signed)
Patient called [facility] due to back pain. Patient was seen earlier today from [place] for chronic back pain.

## 2018-01-14 NOTE — ED Provider Notes (Signed)
[name] Provider Note   CSN: [id number] Arrival date & time: 01/14/18  4098     History   Chief Complaint Chief Complaint  Patient presents with  . Chest Pain    HPI [name] is a 42 y.o. male.  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  Patient comes in today complaining of chest pain shortness of breath this started approximately 1 AM.  Patient denies any fevers chills nausea vomiting diaphoresis radiation.  Patient denies any alleviating or aggravating factors. States he is compliant with his coumadin.  Of note patient has had multiple ED visits in the past month patient is homeless.  Past Medical History:  Diagnosis Date  . Hepatitis C     Patient Active Problem List   Diagnosis Date Noted  . Septic embolism (HCC) 01/14/2018  . Severe episode of recurrent major depressive disorder, with psychotic features (HCC)   . Bipolar 1 disorder, mixed, moderate (HCC) 06/29/2017  . Polysubstance dependence including opioid type drug without complication, episodic abuse (HCC) 07/02/2016    Past Surgical History:  Procedure Laterality Date  . APPENDECTOMY          Home Medications    Prior to Admission medications   Medication Sig Start Date End Date Taking? Authorizing Provider  levofloxacin (LEVAQUIN) 750 MG tablet Take 750 mg by mouth daily.   Yes [provider]  MELATONIN PO Take 2 tablets by mouth daily as needed (Sleep).   Yes [provider]  warfarin (COUMADIN) 5 MG tablet Take 5 mg by mouth daily. 01/06/18  Yes [provider]    Family History History reviewed. No pertinent family history.  Social History Social History   Tobacco Use  . Smoking status: Current Every Day Smoker  . Smokeless tobacco: Never Used  Substance Use Topics  . Alcohol  use: No  . Drug use: Yes    Frequency: 7.0 times per week    Types: Methamphetamines, Heroin    Comment: Daily use where possible     Allergies   Bee venom; Food; Penicillins; and Tylenol [acetaminophen]   Review of Systems Review of Systems  Constitutional: Positive for fatigue.  Respiratory: Positive for shortness of breath.   Cardiovascular: Positive for chest pain.  Neurological: Negative for dizziness, weakness, light-headedness and numbness.  All other systems reviewed and are negative.    Physical Exam Updated Vital Signs BP 106/66 (BP Location: Right Arm)   Pulse (!) 101   Temp (!) 97.5 F (36.4 C) (Oral)   Resp 19   SpO2 97%   Physical Exam  Constitutional: He is oriented to person, place, and time. He appears well-developed and well-nourished.  HENT:  Head: Normocephalic and atraumatic.  Eyes: Conjunctivae are normal.  Neck: Neck supple.  Cardiovascular: Normal rate, regular rhythm, intact distal pulses and normal pulses.  Murmur heard. Pulmonary/Chest: Effort normal and breath sounds normal. No respiratory distress.  Abdominal: Soft. There is no tenderness.  Musculoskeletal: He exhibits no edema.       Right lower leg: He exhibits no edema.       Left lower leg: He exhibits no edema.  Neurological: He is alert and oriented to person, place, and time. He is not disoriented. No cranial nerve deficit.  Skin: Skin is warm and dry.  Psychiatric: He has a normal mood and affect.  Nursing note and vitals reviewed.    ED Treatments / Results  Labs (all labs ordered are listed, but only abnormal results are displayed) Labs Reviewed  BASIC METABOLIC PANEL - Abnormal; Notable for the following components:      Result Value   Sodium 134 (*)    Chloride 100 (*)    Glucose, Bld 103 (*)    Calcium 8.4 (*)    All other components within normal limits  CBC - Abnormal; Notable for the following components:   WBC 11.9 (*)    RBC 3.14 (*)    Hemoglobin 8.2 (*)     HCT 27.3 (*)    RDW 16.2 (*)    Platelets 410 (*)    All other components within normal limits  PROTIME-INR - Abnormal; Notable for the following components:   Prothrombin Time 20.3 (*)    All other components within normal limits  HEPATIC FUNCTION PANEL - Abnormal; Notable for the following components:   Albumin 1.8 (*)    All other components within normal limits  DIC (DISSEMINATED INTRAVASCULAR COAGULATION) PANEL - Abnormal; Notable for the following components:   Prothrombin Time 21.4 (*)    aPTT 43 (*)    Fibrinogen 536 (*)    D-Dimer, Quant 2.14 (*)    Platelets 431 (*)    All other components within normal limits  I-STAT VENOUS BLOOD GAS, ED - Abnormal; Notable for the following components:   pH, Ven 7.462 (*)    pCO2, Ven 36.9 (*)    pO2, Ven 24.0 (*)    Acid-Base Excess 3.0 (*)    All other components within normal limits  CULTURE, BLOOD (ROUTINE X 2)  CULTURE, BLOOD (ROUTINE X 2)  MRSA PCR SCREENING  ETHANOL  AMMONIA  HIV ANTIBODY (ROUTINE TESTING)  RAPID URINE DRUG SCREEN, HOSP PERFORMED  I-STAT TROPONIN, ED  I-STAT CG4 LACTIC ACID, ED    EKG None  Radiology Dg Chest 2 View  Result Date: 01/14/2018 CLINICAL DATA:  Left chest pain EXAM: CHEST - 2 VIEW COMPARISON:  01/13/2018 FINDINGS: Heart and mediastinal contours are within normal limits. No focal opacities or effusions. No acute bony abnormality. IMPRESSION: No active cardiopulmonary disease. Electronically Signed   By: Charlett Nose M.D.   On: 01/14/2018 08:29   Dg Chest 2 View  Result Date: 01/13/2018 CLINICAL DATA:  Weakness, hungry, pain.  History of hepatitis-C. EXAM: CHEST - 2 VIEW COMPARISON:  None. FINDINGS: The heart size and mediastinal contours are within normal limits. Both lungs are clear. The visualized skeletal structures are unremarkable. IMPRESSION: Negative. Electronically Signed   By: Awilda Metro M.D.   On: 01/13/2018 19:46   Ct Head Wo Contrast  Addendum Date: 01/14/2018     ADDENDUM REPORT: 01/14/2018 10:35 ADDENDUM: Study discussed by telephone with Dr. Shaune Pollack on 01/14/2018 at 1015 hours. 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 THEREFORE, the appearance is highly likely to reflect SEPTIC EMBOLI, with acute embolic related hemorrhage in the right superior frontal lobe. Electronically Signed   By: Odessa Fleming M.D.   On: 01/14/2018 10:35   Result Date: 01/14/2018 CLINICAL DATA:  42 year old male with altered level consciousness, lightheadedness and dizzy onset about 1 hour ago. EXAM: CT HEAD WITHOUT CONTRAST TECHNIQUE: Contiguous axial images were obtained from the base of the skull through the vertex without intravenous contrast. COMPARISON:  None. FINDINGS: Brain: Cytotoxic edema at the right anterior insula and frontal operculum. No associated hemorrhage or mass effect in those  segments. Small amorphous 7 millimeter focus of hyperdensity at the right superior frontal gyrus on series 3, image 25 and sagittal image 26. On coronal images there is adjacent hypodensity (coronal image 34). No regional mass effect. No other cytotoxic edema identified. ASPECTS 7 (abnormal insula, M4, M 5). More chronic appearing left corona radiata white matter hypodensity on the left. No intracranial mass effect or midline shift. No ventriculomegaly. Normal basilar cisterns. Vascular: Abnormal cylindrical hyperdensity at the right MCA proximal M2 segment with Vessel expansion (series 3, image 14 and series 6, image 21). Skull: No acute osseous abnormality identified. Sinuses/Orbits: Visualized paranasal sinuses and mastoids are well pneumatized. Other: Mildly Disconjugate gaze, otherwise negative orbits soft tissues. No acute scalp soft tissue findings, scattered benign appearing scalp dermal nodularity. IMPRESSION: 1. 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 2. Possible trace focus of acute hemorrhage in the right superior frontal gyrus on series 3, image 25. No  other acute intracranial hemorrhage identified. 3. Right MCA territory cytotoxic edema, ASPECTS 7 (although note suspicion of trace acute hemorrhage in #2). 4. Chronic appearing left MCA white matter disease. Electronically Signed: By: Odessa Fleming M.D. On: 01/14/2018 10:18   Mr Maxine Glenn Head Wo Contrast  Result Date: 01/14/2018 CLINICAL DATA:  In hospital for sepsis, altered mental status. History of hepatitis-C, polysubstance abuse and endocarditis. History of head injury 1 month ago. Follow-up RIGHT MCA stroke. EXAM: MRI HEAD WITHOUT CONTRAST MRA HEAD WITHOUT CONTRAST TECHNIQUE: Multiplanar, multiecho pulse sequences of the brain and surrounding structures were obtained without intravenous contrast. Angiographic images of the head were obtained using MRA technique without contrast. COMPARISON:  CT HEAD Jan 14, 2018 at 0951 hours. FINDINGS: MRI HEAD FINDINGS Multiple sequences are mildly or moderately motion degraded. INTRACRANIAL CONTENTS: Patchy reduced diffusion RIGHT frontal lobe and RIGHT basal ganglia, LEFT posterior frontal lobe with normalized ADC values and focal T2 shine through. Associated FLAIR T2 hyperintense signal and faint hemosiderin staining. Slightly expanded FLAIR T2 hyperintense RIGHT basal ganglia. Superimposed focal susceptibility artifact bifrontal lobes, RIGHT parietooccipital lobe. Multiple additional scattered micro hemorrhages. Mild parenchymal brain volume loss. No hydrocephalus. Old small RIGHT cerebellar infarcts. VASCULAR: Susceptibility artifact and T1 shortening RIGHT sylvian fissure corresponding to MCA density. SKULL AND UPPER CERVICAL SPINE: No abnormal sellar expansion. No suspicious calvarial bone marrow signal. Craniocervical junction maintained. SINUSES/ORBITS: The mastoid air-cells and included paranasal sinuses are well-aerated.The included ocular globes and orbital contents are non-suspicious. OTHER: None. MRA HEAD FINDINGS ANTERIOR CIRCULATION: Normal flow related enhancement  of the included cervical, petrous, cavernous and supraclinoid internal carotid arteries. Patent anterior communicating artery. Mildly attenuated RIGHT MCA flow related enhancement, occluded RIGHT M2 segment with transient aneurysmal reconstitution. No flow limiting stenosis. POSTERIOR CIRCULATION: vertebral artery is dominant. Basilar artery is patent, with normal flow related enhancement of the main branch vessels. Patent posterior cerebral arteries. Robust RIGHT and smaller LEFT posterior communicating arteries present. No large vessel occlusion, flow limiting stenosis,  aneurysm. ANATOMIC VARIANTS: None. Source images and MIP images were reviewed. IMPRESSION: MRI HEAD: 1. Multifocal subacute bilateral MCA/watershed territory infarcts. 2. Peripheral susceptibility artifact, septic emboli suspected. There may be superimposed shear injury or underlying chronic hypertension. 3. Mild parenchymal brain volume loss for age. 4. Old small RIGHT cerebellar infarcts. MRA HEAD: 1. 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 2. Otherwise negative MRA head. Examination reviewed by Dr. Augusto Gamble with Dr. Wilford Corner, Neurology on Jan 14, 2018 at approximately  1425 hours. Electronically Signed   By: Awilda Metro M.D.   On: 01/14/2018 14:38   Mr Brain Wo Contrast  Result Date: 01/14/2018 CLINICAL DATA:  In hospital for sepsis, altered mental status. History of hepatitis-C, polysubstance abuse and endocarditis. History of head injury 1 month ago. Follow-up RIGHT MCA stroke. EXAM: MRI HEAD WITHOUT CONTRAST MRA HEAD WITHOUT CONTRAST TECHNIQUE: Multiplanar, multiecho pulse sequences of the brain and surrounding structures were obtained without intravenous contrast. Angiographic images of the head were obtained using MRA technique without contrast. COMPARISON:  CT HEAD Jan 14, 2018 at 0951 hours. FINDINGS: MRI HEAD FINDINGS Multiple  sequences are mildly or moderately motion degraded. INTRACRANIAL CONTENTS: Patchy reduced diffusion RIGHT frontal lobe and RIGHT basal ganglia, LEFT posterior frontal lobe with normalized ADC values and focal T2 shine through. Associated FLAIR T2 hyperintense signal and faint hemosiderin staining. Slightly expanded FLAIR T2 hyperintense RIGHT basal ganglia. Superimposed focal susceptibility artifact bifrontal lobes, RIGHT parietooccipital lobe. Multiple additional scattered micro hemorrhages. Mild parenchymal brain volume loss. No hydrocephalus. Old small RIGHT cerebellar infarcts. VASCULAR: Susceptibility artifact and T1 shortening RIGHT sylvian fissure corresponding to MCA density. SKULL AND UPPER CERVICAL SPINE: No abnormal sellar expansion. No suspicious calvarial bone marrow signal. Craniocervical junction maintained. SINUSES/ORBITS: The mastoid air-cells and included paranasal sinuses are well-aerated.The included ocular globes and orbital contents are non-suspicious. OTHER: None. MRA HEAD FINDINGS ANTERIOR CIRCULATION: Normal flow related enhancement of the included cervical, petrous, cavernous and supraclinoid internal carotid arteries. Patent anterior communicating artery. Mildly attenuated RIGHT MCA flow related enhancement, occluded RIGHT M2 segment with transient aneurysmal reconstitution. No flow limiting stenosis. POSTERIOR CIRCULATION: vertebral artery is dominant. Basilar artery is patent, with normal flow related enhancement of the main branch vessels. Patent posterior cerebral arteries. Robust RIGHT and smaller LEFT posterior communicating arteries present. No large vessel occlusion, flow limiting stenosis,  aneurysm. ANATOMIC VARIANTS: None. Source images and MIP images were reviewed. IMPRESSION: MRI HEAD: 1. Multifocal subacute bilateral MCA/watershed territory infarcts. 2. Peripheral susceptibility artifact, septic emboli suspected. There may be superimposed shear injury or underlying chronic  hypertension. 3. Mild parenchymal brain volume loss for age. 4. Old small RIGHT cerebellar infarcts. MRA HEAD: 1. 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 2. Otherwise negative MRA head. Examination reviewed by Dr. Augusto Gamble with Dr. Wilford Corner, Neurology on Jan 14, 2018 at approximately 1425 hours. Electronically Signed   By: Awilda Metro M.D.   On: 01/14/2018 14:38    Procedures Procedures (including critical care time)  Medications Ordered in ED Medications  iopamidol (ISOVUE-370) 76 % injection 100 mL (has no administration in time range)  vancomycin (VANCOCIN) IVPB 1000 mg/200 mL premix (has no administration in time range)  piperacillin-tazobactam (ZOSYN) IVPB 3.375 g (has no administration in time range)  senna-docusate (Senokot-S) tablet 1 tablet (1 tablet Oral Not Given 01/14/18 1631)  famotidine (PEPCID) IVPB 20 mg premix (20 mg Intravenous Not Given 01/14/18 1630)  0.9 %  sodium chloride infusion (has no administration in time range)  sodium chloride 0.9 % bolus 1,000 mL (0 mLs Intravenous Stopped 01/14/18 1228)  vancomycin (VANCOCIN) 1,250 mg in sodium chloride 0.9 % 250 mL IVPB (0 mg Intravenous Stopped 01/14/18 1631)  piperacillin-tazobactam (ZOSYN) IVPB 3.375 g (0 g Intravenous Stopped 01/14/18 1228)   stroke: mapping our early stages of recovery book ( Does not apply Given 01/14/18 1130)     Initial Impression / Assessment and Plan / ED Course  I have  reviewed the triage vital signs and the nursing notes.  Pertinent labs & imaging results that were available during my care of the patient were reviewed by me and considered in my medical decision making (see chart for details).     Physical exam is unremarkable.  Patient clear to auscultation bilaterally.  Patient without appreciable swelling of the lower extremities and no tenderness to palpation.  Patient had troponin drawn at 8 PM  last night during previous visit as well as troponin drawn this morning greater than 5 hours after onset of chest pain.  Do not believe repeat troponin to be necessary given time line and absence of symptoms at this time.  Patient denies having any current chest pain or shortness of breath.   Additional laboratory testing also collected including imaging of the head.  CT head showed area of hypoattenuation in the right MCA distribution with possible associated intraparenchymal hemorrhage.  Neurology was consulted given concern for stroke.  Patient with NIH of 0 and normal neurological exam.  Neurology request MRI.  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  Given patient's high propensity for decompensation, will admit patient to the medical ICU for further evaluation and care.  Final Clinical Impressions(s) / ED Diagnoses   Final diagnoses:  None    ED Discharge Orders    None       Caren Griffins, MD 01/14/18 1639    Shaune Pollack, MD 01/14/18 [phone]

## 2018-01-14 NOTE — ED Notes (Signed)
Per EMS, Patient walked to EMS truck without any difficulty.

## 2018-01-14 NOTE — Progress Notes (Addendum)
CSW spoke with pt at bedside. CSW informed pt of resources are in the [facility] areas regarding homeless issues. Pt expressed verbal understanding of these at this time. CSW provided pt with bus pass to get to next destination as CSW aware that pt is planning to be discharged. CSW will continue to follow for further needs.   Claude Manges Elizaveta Mattice, MSW, LCSW-A Emergency Department Clinical Social Worker [phone]

## 2018-01-14 NOTE — ED Notes (Signed)
Per Dr. Erma Heritage, okay to transport patient to MRI after Zosyn administered.  Patient transported to MRI

## 2018-01-14 NOTE — ED Provider Notes (Signed)
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  States that he has had back pain since a snowboarding accident 5 years ago.  States he will have a flareup of his back pain intermittently.  Denies any numbness that is new.  No focal weakness.  Able to ambulate.  No bowel or bladder incontinence.  No fever.  No new injury to his back.   I have reviewed patient's records from [facility].  It appears he had an abrupt occlusion of the abdominal aorta just inferior to the inferior mesenteric artery as well as of the splenic artery and a branch of the superior mesenteric artery.  Imaging shows large splenic infarct and multiple bilateral kidney infarcts.   Was seen in the emergency department at [facility] yesterday.  At that time he had unremarkable work-up occluding normal labs except for a slightly subtherapeutic INR of 1.57.   ROS: See HPI Constitutional: no fever  Eyes: no drainage  ENT: no runny nose   Cardiovascular:  no chest pain  Resp: no SOB  GI: no vomiting GU: no dysuria Integumentary: no rash  Allergy: no hives  Musculoskeletal: no leg swelling  Neurological: no slurred speech ROS otherwise negative  PAST MEDICAL HISTORY/PAST SURGICAL HISTORY:  Past Medical History:  Diagnosis Date  . Hepatitis C     MEDICATIONS:  Prior to Admission medications   Medication Sig Start Date End Date Taking? Authorizing Provider  levofloxacin (LEVAQUIN) 750 MG tablet Take 750 mg by mouth daily.    [provider]  warfarin (COUMADIN) 5 MG tablet Take 5 mg by mouth daily. 01/06/18   [provider]    ALLERGIES:  Allergies  Allergen Reactions  . Bee Venom Anaphylaxis  . Food Hives  and Other (See Comments)    grapefruit  . Penicillins Other (See Comments)    Possible reaction - pt unsure Has patient had a PCN reaction causing immediate rash, facial/tongue/throat swelling, SOB or lightheadedness with hypotension: unk Has patient had a PCN reaction causing severe rash involving mucus membranes or skin necrosis: unk Has patient had a PCN reaction that required hospitalization: unk Has patient had a PCN reaction occurring within the last 10 years: No If all of the above answers are "NO", then may proceed with Cephalosporin use.   . Tylenol [Acetaminophen] Other (See Comments)    Pt states that he is not supposed to take this medication because of his Hep C.     SOCIAL HISTORY:  Social History   Tobacco Use  . Smoking status: Current Every Day Smoker  . Smokeless tobacco: Never Used  Substance Use Topics  . Alcohol use: No    FAMILY HISTORY: History reviewed. No pertinent family history.  EXAM: BP 108/67 (BP Location: Left Arm)   Pulse 70   Temp 97.7 F (36.5 C) (Oral)   Resp 18   Ht  (1.803 m)   Wt 63.5 kg (140 lb)   SpO2 100%   BMI 19.53 kg/m  CONSTITUTIONAL: Alert and oriented and responds appropriately to questions. Well-appearing; well-nourished HEAD: Normocephalic EYES: Conjunctivae clear, pupils appear equal, EOMI ENT: normal nose; moist mucous membranes NECK: Supple, no meningismus, no nuchal rigidity, no LAD  CARD: RRR; S1 and S2 appreciated; no  murmurs, no clicks, no rubs, no gallops RESP: Normal chest excursion without splinting or tachypnea; breath sounds clear and equal bilaterally; no wheezes, no rhonchi, no rales, no hypoxia or respiratory distress, speaking full sentences ABD/GI: Normal bowel sounds; non-distended; soft, non-tender, no rebound, no guarding, no peritoneal signs, no hepatosplenomegaly BACK:  The back appears normal and is non-tender to palpation, there is no CVA tenderness, no midline spinal tenderness or step-off or  deformity EXT: Normal ROM in all joints; non-tender to palpation; no edema; normal capillary refill; no cyanosis, no calf tenderness or swelling extreme is are warm and well-perfused SKIN: Normal color for age and race; warm; no rash NEURO: Moves all extremities equally, strength 5/5 in all 4 extremities, sensation to light touch intact diffusely gait, normal speech, no saddle anesthesia, no clonus, normal reflexes PSYCH: The patient's mood and manner are appropriate. Grooming and personal hygiene are appropriate.  MEDICAL DECISION MAKING: Patient here with acute exacerbation of his chronic back pain.  In no distress currently neurologically intact.  Does have recent history of abdominal aortic occlusion and reports compliance with his blood thinners.  Was here at [facility] earlier yesterday and found to have a subtherapeutic INR and offered Lovenox which he declined and continues to decline.  I do not think that this is pain from his aortic thrombus.  He states he has had this chronic pain for 5 years.  He agrees that this is unlikely from his recent aortic occlusion diagnosed Dec 30, 2017 at [facility].  In no distress currently.  I do not feel he needs repeat imaging.  Doubt cauda equina, spinal stenosis, epidural abscess or hematoma, discitis or osteomyelitis, transverse myelitis, spinal infarct at this time.  Will give Tylenol for pain control.  Discussed return precautions.  Given his history of substance abuse I do not feel is appropriate to discharge him with opiates or muscle relaxers.  He verbalized understanding.  At this time, I do not feel there is any life-threatening condition present. I have reviewed and discussed all results (EKG, imaging, lab, urine as appropriate) and exam findings with patient/family. I have reviewed nursing notes and appropriate previous records.  I feel the patient is safe to be discharged home without further emergent workup and can continue workup as an outpatient as  needed. Discussed usual and customary return precautions. Patient/family verbalize understanding and are comfortable with this plan.  Outpatient follow-up has been provided if needed. All questions have been answered.      Ward, Layla Maw, DO 01/14/18 [phone]

## 2018-01-14 NOTE — Consult Note (Addendum)
Neurology Consultation  Reason for Consult: Abnormal CT scan findings Referring Physician: Dr. Shaune Pollack  CC: Chest pain  History is obtained from: Chart and patient  HPI: Jamaul Heist is a 42 y.o. 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 He was being evaluated in the emergency room, and as part of work-up, because he appeared a little encephalopathic, a noncontrast CT head was performed. I got a page from neuroradiology-Dr. Margo Aye, stating that the patient's noncontrast CT of the head shows hyperdense right MCA as well as early changes in the right MCA territory with an aspect score of 7. The patient had no neurological complaints. I went to evaluate the patient.  He reports a head injury that happened a month ago.  He also reports numbness in both his feet.  Denies any lateralized tingling numbness or weakness.  Denies any visual changes.  He reports compliance to his antibiotics and Coumadin. Reports chest pain shortness of breath.  Reports generalized discomfort.  Reports bilateral foot numbness.  Reports generalized weakness.  LKW: 1 AM on 01/14/2018 tpa given?: no, outside the window Premorbid modified Rankin scale (mRS): 0  ROS: ROS was performed and is negative except as noted in the HPI.   Past Medical History:  Diagnosis Date  . Hepatitis C    History reviewed. No pertinent family history.   Social History:   reports that he has been smoking.  He has never used smokeless tobacco. He reports that he has current or past drug history. Drugs: Methamphetamines and Heroin. Frequency: 7.00 times per week. He reports that he does not drink alcohol. Positive for tobacco abuse Positive for IV drug abuse including methamphetamines and heroin Negative for  ethanol  Medications  Current Facility-Administered Medications:  .  iopamidol (ISOVUE-370) 76 % injection 100 mL, 100 mL, Intravenous, Once PRN, Aroor, Georgiana Spinner R, MD .  iopamidol (ISOVUE-370) 76 % injection, , , ,  .  piperacillin-tazobactam (ZOSYN) IVPB 3.375 g, 3.375 g, Intravenous, Once, Shaune Pollack, MD .  sodium chloride 0.9 % bolus 1,000 mL, 1,000 mL, Intravenous, Once, Shaune Pollack, MD .  vancomycin (VANCOCIN) 1,250 mg in sodium chloride 0.9 % 250 mL IVPB, 1,250 mg, Intravenous, Once, Shaune Pollack, MD  Current Outpatient Medications:  .  levofloxacin (LEVAQUIN) 750 MG tablet, Take 750 mg by mouth daily., Disp: , Rfl:  .  MELATONIN PO, Take 2 tablets by mouth daily as needed (Sleep)., Disp: , Rfl:  .  warfarin (COUMADIN) 5 MG tablet, Take 5 mg by mouth daily., Disp: , Rfl: 0  Exam: Current vital signs: BP 102/78   Pulse 87   Temp (!) 97.5 F (36.4 C) (Oral)   Resp 16   SpO2 100%  Vital signs in last 24 hours: Temp:  [97.5 F (36.4 C)-98.2 F (36.8 C)] 97.5 F (36.4 C) (05/16 0732) Pulse Rate:  [70-96] 87 (05/16 0945) Resp:  [16-25] 16 (05/16 0945) BP: (94-111)/(52-78) 102/78 (05/16 0945) SpO2:  [97 %-100 %] 100 % (05/16 0945) Weight:  [63.5 kg (140 lb)] 63.5 kg (140 lb) (05/16 0303) General: Awake alert in no distress HEENT: Normocephalic atraumatic, dry mucous membranes Lungs: Clear to auscultation Cardiovascular: S1-S2 heard +murmur Extremities: Decreased bulk with normal tone, multiple bandage marks on the left upper extremity Neurological exam Patient is awake, alert, oriented  to self and place.  Not oriented to the date His speech is clear.  Naming comprehension repetition is intact Cranial nerves: Pupils equal round reactive light, extra ocular movements intact, visual fields full, face is symmetric, facial sensation intact, auditory acuity intact, palate elevates in midline, tongue midline, shoulder shrug intact. Motor exam: Grossly symmetric 5/5  strength in all 4 extremities with no vertical drift.  Normal range of motion Sensory exam shows no decrease in sensation and no extinction Coordination: Intact finger-nose-finger bilaterally Gait testing was deferred at this time. NIH stroke scale-0  Labs I have reviewed labs in epic and the results pertinent to this consultation are: CBC    Component Value Date/Time   WBC 11.9 (H) 01/14/2018 0744   RBC 3.14 (L) 01/14/2018 0744   HGB 8.2 (L) 01/14/2018 0744   HCT 27.3 (L) 01/14/2018 0744   PLT 410 (H) 01/14/2018 0744   MCV 86.9 01/14/2018 0744   MCH 26.1 01/14/2018 0744   MCHC 30.0 01/14/2018 0744   RDW 16.2 (H) 01/14/2018 0744   LYMPHSABS 3.3 07/07/2017 0655   MONOABS 0.5 07/07/2017 0655   EOSABS 0.3 07/07/2017 0655   BASOSABS 0.0 07/07/2017 0655   CMP     Component Value Date/Time   NA 134 (L) 01/14/2018 0744   K 3.8 01/14/2018 0744   CL 100 (L) 01/14/2018 0744   CO2 26 01/14/2018 0744   GLUCOSE 103 (H) 01/14/2018 0744   BUN 8 01/14/2018 0744   CREATININE 0.71 01/14/2018 0744   CALCIUM 8.4 (L) 01/14/2018 0744   PROT 8.0 06/28/2017 2139   ALBUMIN 3.9 06/28/2017 2139   AST 41 06/28/2017 2139   ALT 64 (H) 06/28/2017 2139   ALKPHOS 73 06/28/2017 2139   BILITOT 0.5 06/28/2017 2139   GFRNONAA >60 01/14/2018 0744   GFRAA >60 01/14/2018 0744    Lipid Panel     Component Value Date/Time   CHOL 162 07/06/2016 0641   TRIG 82 07/06/2016 0641   HDL 59 07/06/2016 0641   CHOLHDL 2.7 07/06/2016 0641   VLDL 16 07/06/2016 0641   LDLCALC 87 07/06/2016 0641     Imaging I have reviewed the images obtained: CT-scan of the brain-hyperdensity in the right M1 along with a small area of possible bleed in the right frontal area.  Hypodensity in the insula and right frontal area. According to radiologist, this is suspicious for the septic emboli with a history that this patient has.  Assessment:  42 year old man with past medical history of substance abuse and infective  endocarditis with septic aortic thrombus currently on Coumadin with subtherapeutic INR who was being evaluated for chest pain or shortness of breath noted to be somewhat altered in his mentation.  Noncontrast head CT shows a hyperdense M1 with a small area of poor density may be reflecting a bleed versus a mycotic aneurysm as well as changes of stroke in the right MCA territory. His NIH stroke scale is 0. He is not a candidate for IV TPA as he is outside the window and because of the infective endocarditis. Due to his low disability-and a stroke scale 0, he is not a candidate for intervention.  For that reason I have not pursued a CT angiogram of his head and neck. I will pursue a stat MRI and MRA of his brain. 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 ICH score 0 -History of IV drug use -Recent history of septic emboli in the aorta -Recent history of infectious endocarditis  Recommendations: -Recommend admission to ICU -Hold off anticoagulation and antiplatelets for right now for twofold reasons - 1.possible area of small bleed, and 2. the fear of mycotic aneurysms which might rupture and lead to large bleeds. -SBP goal <140 -Obtain stat MRI of the brain.  Stat MRA of the brain to look for evidence of mycotic aneurysms. -2D echocardiogram -Telemetry -Hemoglobin A1c and LDL -PT OT speech therapy -Management of infective endocarditis and septic aortic emboli per primary team  Please page stroke NP/PA/MD (listed on AMION)  from 8am-4 pm as this patient will be followed by the stroke team at this point.  -- Milon Dikes, MD [facility] Pager: [phone] If 7pm to 7am, please call on call as  listed on AMION.  CRITICAL CARE ATTESTATION This patient is critically ill and at significant risk of neurological worsening, death and care requires constant monitoring of vital signs, hemodynamics,respiratory and cardiac monitoring. I spent 45  minutes of neurocritical care time performing neurological assessment, discussion with family, other specialists and medical decision making of high complexityin the care of  this patient.

## 2018-01-14 NOTE — ED Notes (Signed)
Patient not currently in room

## 2018-01-14 NOTE — Progress Notes (Signed)
Pharmacy Antibiotic Note  Marcus [name] is a 42 y.o. male admitted on 01/14/2018 with sepsis.  Pharmacy has been consulted for vancomycin and zosyn dosing.  Bcx from 4/30 at [facility] grew strep pneumonia (I cefuroxime, R Bactrim). 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 CT head today found to have large vessel occlusion of R MCA proximal M2 with possible trace focus of acute hemorrhage in R superior frontal gyrus. WBC is 11.9, afebrile, LA is 0.83. Antibiotic therapy was changed to vancomycin and zosyn.   Plan: Vancomycin 1250 mg IV once  Vancomycin 1000 mg IV every 8 hours.  Goal trough 15-20 mcg/mL. Zosyn 3.375g IV q8h (4 hour infusion). Monitor renal function, cx results, clinical pic, VT prn     Temp (24hrs), Avg:97.8 F (36.6 C), Min:97.5 F (36.4 C), Max:98.2 F (36.8 C)  Recent Labs  Lab 01/12/18 1938 01/13/18 1832 01/14/18 0744 01/14/18 1031  WBC 11.2* 9.8 11.9*  --   CREATININE 0.64 0.74 0.71  --   LATICACIDVEN  --   --   --  0.83    Estimated Creatinine Clearance: 110.2 mL/min (by C-G formula based on SCr of 0.71 mg/dL).    Allergies  Allergen Reactions  . Bee Venom Anaphylaxis  . Food Hives and Other (See Comments)    grapefruit  . Penicillins Other (See Comments)    Possible reaction - pt unsure Has patient had a PCN reaction causing immediate rash, facial/tongue/throat swelling, SOB or lightheadedness with hypotension: unk Has patient had a PCN reaction causing severe rash involving mucus membranes or skin necrosis: unk Has patient had a PCN reaction that required hospitalization: unk Has patient had a PCN reaction occurring within the last 10 years: No If all of the above answers are "NO", then may proceed with Cephalosporin use.   . Tylenol [Acetaminophen] Other (See Comments)    Pt states that he is not supposed to take this medication because of his Hep C.     Antimicrobials  this admission: Vancomycin 5/16 >>  Zosyn 5/16 >>   Dose adjustments this admission: N/A  Microbiology results: 5/16 BCx: sent  Thank you for allowing pharmacy to be a part of this patient's care.  Girard Cooter, PharmD Clinical Pharmacist  Pager: [phone] Phone: [phone] 01/14/2018 11:04 AM

## 2018-01-14 NOTE — ED Notes (Signed)
Echo at bedside

## 2018-01-14 NOTE — ED Triage Notes (Signed)
Pt to ER by [facility] for evaluation of chest tightness reports onset one hour ago when discharged from [facility]. Reports light headed and dizzy. EMS reports 98/68, received 300 cc fluid in route, 20g LAC. Pt ambulatory. A/o x4.

## 2018-01-14 NOTE — ED Notes (Signed)
Called pt for vitals x5, no response.

## 2018-01-14 NOTE — ED Notes (Signed)
Pt reports tylenol prior to discharge at [facility] long that was given helped. Pt does have recent hx of abdominal aorta occlusion is on warfarin and reports compliance.

## 2018-01-14 NOTE — Discharge Planning (Signed)
EDCM and EDSW met woth pt at bedside regarding homeless issues and transportation.  Pt very sleepy/groggy but answered questions appropriately.  Pt states he is familiar with [facility] resources for homeless and medical services.  Pt has been seen at [facility] in the past and states he did not go there because they were closed.  Care Management will continue to follow for disposition needs. 

## 2018-01-14 NOTE — Progress Notes (Signed)
  Echocardiogram 2D Echocardiogram has been performed.  Roosvelt Maser F 01/14/2018, 1:12 PM

## 2018-01-15 ENCOUNTER — Inpatient Hospital Stay (HOSPITAL_COMMUNITY): Payer: Medicaid Other

## 2018-01-15 DIAGNOSIS — I330 Acute and subacute infective endocarditis: Principal | ICD-10-CM

## 2018-01-15 DIAGNOSIS — I611 Nontraumatic intracerebral hemorrhage in hemisphere, cortical: Secondary | ICD-10-CM

## 2018-01-15 DIAGNOSIS — I76 Septic arterial embolism: Secondary | ICD-10-CM

## 2018-01-15 DIAGNOSIS — I619 Nontraumatic intracerebral hemorrhage, unspecified: Secondary | ICD-10-CM

## 2018-01-15 DIAGNOSIS — I058 Other rheumatic mitral valve diseases: Secondary | ICD-10-CM | POA: Insufficient documentation

## 2018-01-15 DIAGNOSIS — I7410 Embolism and thrombosis of unspecified parts of aorta: Secondary | ICD-10-CM

## 2018-01-15 DIAGNOSIS — I700 Atherosclerosis of aorta: Secondary | ICD-10-CM

## 2018-01-15 DIAGNOSIS — I729 Aneurysm of unspecified site: Secondary | ICD-10-CM

## 2018-01-15 DIAGNOSIS — I059 Rheumatic mitral valve disease, unspecified: Secondary | ICD-10-CM | POA: Insufficient documentation

## 2018-01-15 LAB — BASIC METABOLIC PANEL
ANION GAP: 6 (ref 5–15)
BUN: 5 mg/dL — ABNORMAL LOW (ref 6–20)
CO2: 25 mmol/L (ref 22–32)
Calcium: 8.3 mg/dL — ABNORMAL LOW (ref 8.9–10.3)
Chloride: 108 mmol/L (ref 101–111)
Creatinine, Ser: 0.74 mg/dL (ref 0.61–1.24)
GFR calc non Af Amer: >60 mL/min (ref >60)
GLUCOSE: 99 mg/dL (ref 65–99)
Potassium: 3.6 mmol/L (ref 3.5–5.1)
Sodium: 139 mmol/L (ref 135–145)

## 2018-01-15 LAB — CBC
HEMATOCRIT: 27.1 % — AB (ref 39.0–52.0)
HEMOGLOBIN: 8.2 g/dL — AB (ref 13.0–17.0)
MCH: 26.5 pg (ref 26.0–34.0)
MCHC: 30.3 g/dL (ref 30.0–36.0)
MCV: 87.7 fL (ref 78.0–100.0)
Platelets: 417 10*3/uL — ABNORMAL HIGH (ref 150–400)
RBC: 3.09 MIL/uL — ABNORMAL LOW (ref 4.22–5.81)
RDW: 16.2 % — AB (ref 11.5–15.5)
WBC: 11.5 10*3/uL — AB (ref 4.0–10.5)

## 2018-01-15 LAB — HEMOGLOBIN A1C
Hgb A1c MFr Bld: 4.8 % (ref 4.8–5.6)
MEAN PLASMA GLUCOSE: 91.06 mg/dL

## 2018-01-15 LAB — PHOSPHORUS: Phosphorus: 3.6 mg/dL (ref 2.5–4.6)

## 2018-01-15 LAB — VANCOMYCIN, TROUGH: VANCOMYCIN TR: 18 ug/mL (ref 15–20)

## 2018-01-15 LAB — PROTIME-INR
INR: 1.98
Prothrombin Time: 22.3 seconds — ABNORMAL HIGH (ref 11.4–15.2)

## 2018-01-15 LAB — POCT I-STAT 3, ART BLOOD GAS (G3+)
Acid-base deficit: 1 mmol/L (ref 0.0–2.0)
Bicarbonate: 23.5 mmol/L (ref 20.0–28.0)
O2 Saturation: 97 %
PCO2 ART: 37 mmHg (ref 32.0–48.0)
PH ART: 7.41 (ref 7.350–7.450)
PO2 ART: 92 mmHg (ref 83.0–108.0)
Patient temperature: 98.6
TCO2: 25 mmol/L (ref 22–32)

## 2018-01-15 LAB — LIPID PANEL
CHOL/HDL RATIO: 3.7 ratio
CHOLESTEROL: 100 mg/dL (ref 0–200)
HDL: 27 mg/dL — ABNORMAL LOW (ref >40)
LDL Cholesterol: 64 mg/dL (ref 0–99)
TRIGLYCERIDES: 43 mg/dL (ref <150)
VLDL: 9 mg/dL (ref 0–40)

## 2018-01-15 LAB — MAGNESIUM: MAGNESIUM: 1.8 mg/dL (ref 1.7–2.4)

## 2018-01-15 LAB — HIV ANTIBODY (ROUTINE TESTING W REFLEX): HIV SCREEN 4TH GENERATION: NONREACTIVE

## 2018-01-15 MED ORDER — MAGNESIUM SULFATE 2 GM/50ML IV SOLN
2.0000 g | Freq: Once | INTRAVENOUS | Status: AC
  Administered 2018-01-15: 2 g via INTRAVENOUS
  Filled 2018-01-15: qty 50

## 2018-01-15 MED ORDER — PHYTONADIONE 5 MG PO TABS
5.0000 mg | ORAL_TABLET | Freq: Once | ORAL | Status: AC
  Administered 2018-01-15: 5 mg via ORAL
  Filled 2018-01-15: qty 1

## 2018-01-15 MED ORDER — MUPIROCIN 2 % EX OINT
1.0000 "application " | TOPICAL_OINTMENT | Freq: Two times a day (BID) | CUTANEOUS | Status: AC
  Administered 2018-01-15 – 2018-01-19 (×10): 1 via NASAL
  Filled 2018-01-15 (×4): qty 22

## 2018-01-15 MED ORDER — CHLORHEXIDINE GLUCONATE CLOTH 2 % EX PADS
6.0000 | MEDICATED_PAD | Freq: Every day | CUTANEOUS | Status: AC
  Administered 2018-01-15 – 2018-01-19 (×5): 6 via TOPICAL

## 2018-01-15 MED ORDER — ACETAMINOPHEN 325 MG PO TABS
650.0000 mg | ORAL_TABLET | Freq: Four times a day (QID) | ORAL | Status: DC | PRN
  Administered 2018-01-15 – 2018-02-09 (×29): 650 mg via ORAL
  Filled 2018-01-15 (×29): qty 2

## 2018-01-15 NOTE — Evaluation (Signed)
Speech Language Pathology Evaluation Patient Details Name: Marcus [name] MRN: [id number] DOB: [birth year] Today's Date: 01/15/2018 Time: 4098-1191 SLP Time Calculation (min) (ACUTE ONLY): 22 min  Problem List:  Patient Active Problem List   Diagnosis Date Noted  . Septic embolism (HCC) 01/14/2018  . Cerebral embolism with cerebral infarction 01/14/2018  . Severe episode of recurrent major depressive disorder, with psychotic features (HCC)   . Bipolar 1 disorder, mixed, moderate (HCC) 06/29/2017  . Polysubstance dependence including opioid type drug without complication, episodic abuse (HCC) 07/02/2016   Past Medical History:  Past Medical History:  Diagnosis Date  . Hepatitis C    Past Surgical History:  Past Surgical History:  Procedure Laterality Date  . APPENDECTOMY     HPI:  Pt is a 42 y.o. 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 MRI showed multifocal subacute bilateral MCA/watershed territory infarcts.   Assessment / Plan / Recommendation Clinical Impression  Pt has limited selective attention, distracted both internally and externally. SLP attempted to reduce distractions in environment by turning the TV off but pt insisted on leaving it on, saying that it was "better to have a distraction." He has difficulty with topic maintenance, word-finding, simple verbal problem solving, and storage/retrieval of new information - all likely related to his attention level. He needs Min-Mod cues for intellectual and safety awareness. He believes that he is at his cognitive baseline, although given his reduced awareness it is difficult to determine how accurrate this is. Recommend for SLP to f/u at least acutely for additional assessment.    SLP Assessment  SLP Recommendation/Assessment: Patient needs continued Speech Lanaguage Pathology  Services SLP Visit Diagnosis: Cognitive communication deficit (R41.841)    Follow Up Recommendations  24 hour supervision/assistance    Frequency and Duration min 2x/week  1 week      SLP Evaluation Cognition  Overall Cognitive Status: Impaired/Different from baseline Arousal/Alertness: Awake/alert Orientation Level: Oriented X4 Attention: Selective Selective Attention: Impaired Selective Attention Impairment: Verbal basic;Functional basic Memory: Impaired Memory Impairment: Retrieval deficit;Decreased recall of new information Awareness: Impaired Awareness Impairment: Intellectual impairment Problem Solving: Impaired Problem Solving Impairment: Verbal basic Behaviors: Impulsive Safety/Judgment: Impaired       Comprehension  Auditory Comprehension Overall Auditory Comprehension: Appears within functional limits for tasks assessed    Expression Expression Primary Mode of Expression: Verbal Verbal Expression Overall Verbal Expression: Impaired Initiation: No impairment Level of Generative/Spontaneous Verbalization: Conversation Naming: Impairment Confrontation: Within functional limits Verbal Errors: Other (comment)(word finding in conversation) Pragmatics: Impairment Impairments: Eye contact;Topic maintenance Interfering Components: Attention Non-Verbal Means of Communication: Not applicable   Oral / Motor  Motor Speech Overall Motor Speech: Appears within functional limits for tasks assessed   GO                    Maxcine Ham 01/15/2018, 11:52 AM   Maxcine Ham, M.A. CCC-SLP [phone]

## 2018-01-15 NOTE — Evaluation (Signed)
Physical Therapy Evaluation Patient Details Name: Marcus [name] MRN: [id number] DOB: 02/18/1976 Today's Date: 01/15/2018   History of Present Illness   42 yo homeless man. He was diagnosed with endocarditis in mid April and treated originally at [facility]. The patient had a vegetationon theposterior leaflet of his mitral valve. Patient come  with exacerbation of his back pain. 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 Noncontrast head CT shows a hyperdense M1 with a small area of poor density may be reflecting a bleed versus a mycotic aneurysm as well as changes of stroke in the right MCA territory.  Clinical Impression  PTA pt homeless and able to ambulate approximately 50 feet without AD before LBP required him to stop until pain subsided. Pt limited in his safe mobility by LBP pain, decreased strength and endurance and reduced safety awareness. Pt currently mod I for bed mobility, min guard for transfers and min guard for ambulation of 15 ft with RW. Ideally pt would receive some level of PT to improve strength and endurance to improve his safe mobility, realistically given his social situation makes that unlikely. PT will continue to see acutely to improve his strength and safety while in the hospital.     Follow Up Recommendations Home health PT    Equipment Recommendations  Rolling walker with 5" wheels    Recommendations for Other Services       Precautions / Restrictions Precautions Precautions: Fall Precaution Comments: reports history of falling Restrictions Weight Bearing Restrictions: No      Mobility  Bed Mobility Overal bed mobility: Modified Independent Bed Mobility: Supine to Sit     Supine to sit: Modified independent (Device/Increase time)     General bed mobility comments: increased time and heavy use of bedrails  Transfers Overall transfer level: Needs assistance Equipment used: Rolling walker (2 wheeled) Transfers:  Sit to/from Stand Sit to Stand: Min guard         General transfer comment: min guard for safety, able to steady in RW without assist  Ambulation/Gait Ambulation/Gait assistance: Min guard Ambulation Distance (Feet): 15 Feet Assistive device: Rolling walker (2 wheeled) Gait Pattern/deviations: Step-through pattern;Decreased stride length;Shuffle;Trunk flexed Gait velocity: slowed Gait velocity interpretation: <1.8 ft/sec, indicate of risk for recurrent falls General Gait Details: slow, shuffling gait, min guard for safety, no overt LoB, vc for proximity to RW and upright posture  Stairs            Wheelchair Mobility    Modified Rankin (Stroke Patients Only)       Balance Overall balance assessment: Needs assistance Sitting-balance support: No upper extremity supported;Feet supported Sitting balance-Leahy Scale: Fair     Standing balance support: Bilateral upper extremity supported Standing balance-Leahy Scale: Poor Standing balance comment: requires RW assist to maintain balance                             Pertinent Vitals/Pain Pain Assessment: 0-10 Pain Score: 9  Pain Location: LBP Pain Descriptors / Indicators: Aching;Throbbing Pain Intervention(s): Limited activity within patient's tolerance;Monitored during session;Repositioned    Home Living Family/patient expects to be discharged to:: Unsure                 Additional Comments: homeless    Prior Function Level of Independence: Independent         Comments: reports being able to ambulate in 50 feet increments, before increase in pain  requires him to sit     Hand Dominance        Extremity/Trunk Assessment   Upper Extremity Assessment Upper Extremity Assessment: Generalized weakness    Lower Extremity Assessment Lower Extremity Assessment: RLE deficits/detail;LLE deficits/detail RLE Deficits / Details: PROM WFL, strength grossly assessed at 2+/5 RLE Sensation:  decreased light touch(numbness and tingling in forefoot) LLE Deficits / Details: PROM WFL, strength grossly assessed at 2+/5 LLE Sensation: decreased light touch(numbness and tingling in forefoot)       Communication   Communication: No difficulties  Cognition Arousal/Alertness: Awake/alert Behavior During Therapy: Restless;Flat affect Overall Cognitive Status: Impaired/Different from baseline Area of Impairment: Problem solving;Safety/judgement                         Safety/Judgement: Decreased awareness of safety;Decreased awareness of deficits   Problem Solving: Slow processing;Requires verbal cues;Requires tactile cues;Difficulty sequencing General Comments: very needy, with ongoing requests for medication, food, blankets       General Comments General comments (skin integrity, edema, etc.): VSS         Assessment/Plan    PT Assessment Patient needs continued PT services  PT Problem List Decreased strength;Decreased activity tolerance;Decreased balance;Decreased mobility;Decreased safety awareness;Decreased cognition;Impaired sensation;Pain       PT Treatment Interventions DME instruction;Gait training;Functional mobility training;Therapeutic activities;Therapeutic exercise;Balance training;Cognitive remediation;Patient/family education    PT Goals (Current goals can be found in the Care Plan section)  Acute Rehab PT Goals Patient Stated Goal: be able to walk further PT Goal Formulation: With patient Time For Goal Achievement: 01/29/18 Potential to Achieve Goals: Fair    Frequency Min 3X/week    AM-PAC PT "6 Clicks" Daily Activity  Outcome Measure Difficulty turning over in bed (including adjusting bedclothes, sheets and blankets)?: A Little Difficulty moving from lying on back to sitting on the side of the bed? : A Little Difficulty sitting down on and standing up from a chair with arms (e.g., wheelchair, bedside commode, etc,.)?: Unable Help needed  moving to and from a bed to chair (including a wheelchair)?: A Little Help needed walking in hospital room?: A Little Help needed climbing 3-5 steps with a railing? : A Lot 6 Click Score: 15    End of Session Equipment Utilized During Treatment: Gait belt Activity Tolerance: Patient tolerated treatment well Patient left: in bed;with call bell/phone within reach;with bed alarm set Nurse Communication: Mobility status;Patient requests pain meds PT Visit Diagnosis: Unsteadiness on feet (R26.81);Other abnormalities of gait and mobility (R26.89);Muscle weakness (generalized) (M62.81);Difficulty in walking, not elsewhere classified (R26.2)    Time: 1610-9604 PT Time Calculation (min) (ACUTE ONLY): 23 min   Charges:   PT Evaluation $PT Eval Moderate Complexity: 1 Mod PT Treatments $Therapeutic Activity: 8-22 mins   PT G Codes:        Dovie Kapusta B. Beverely Risen PT, DPT Acute Rehabilitation  [phone] Pager [phone]    Elon Alas Fleet 01/15/2018, 9:50 AM

## 2018-01-15 NOTE — Progress Notes (Signed)
Pt complaining of back pain. Repositioned without relief. MD notified. Tylenol ordered.

## 2018-01-15 NOTE — Progress Notes (Signed)
CSW acknowledges consult. For further assistance with Advance Directives and POA please consult chaplain services.   CSW will remain involved for any further CSW needs.    Claude Manges Ponciano Shealy, MSW, LCSW-A Emergency Department Clinical Social Worker [phone]

## 2018-01-15 NOTE — Consult Note (Signed)
Chief Complaint: Patient was seen in consultation today for developing mycotic aneurysm.  Referring Physician(s): Marvel Plan  Supervising Physician: Julieanne Cotton  Patient Status: [facility] - In-pt  History of Present Illness: Marcus [name] is a 42 y.o. 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 He presented to ED 01/14/2018 with complaints of chest pain.  CT head 01/14/2018: 1. 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 2. Possible trace focus of acute hemorrhage in the right superior frontal gyrus on series 3, image 25. No other acute intracranial hemorrhage identified. 3. Right MCA territory cytotoxic edema, ASPECTS 7 (although note suspicion of trace acute hemorrhage in #2). 4. Chronic appearing left MCA white matter disease.  MRI/MRA brain/head 01/14/2018: 1. Multifocal subacute bilateral MCA/watershed territory infarcts. 2. Peripheral susceptibility artifact, septic emboli suspected. There may be superimposed shear injury or underlying chronic hypertension. 3. Mild parenchymal brain volume loss for age. 4. Old small RIGHT cerebellar infarcts. 5. 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 6. Otherwise negative MRA head.  IR requested by Dr. Roda Shutters for possible image-guided cerebral angiogram.  Patient awake and alert laying in bed with no complaints at this time. Denies headache, weakness, dizziness, numbness/tingling, vision changes, hearing changes, tinnitus, or speech difficulty.  Past Medical History:  Diagnosis Date  . Hepatitis C     Past Surgical History:  Procedure Laterality Date  . APPENDECTOMY      Allergies: Bee venom; Food; Penicillins; and Tylenol [acetaminophen]  Medications: Prior to Admission medications     Medication Sig Start Date End Date Taking? Authorizing Provider  levofloxacin (LEVAQUIN) 750 MG tablet Take 750 mg by mouth daily.   Yes [provider]  MELATONIN PO Take 2 tablets by mouth daily as needed (Sleep).   Yes [provider]  warfarin (COUMADIN) 5 MG tablet Take 5 mg by mouth daily. 01/06/18  Yes [provider]     History reviewed. No pertinent family history.  Social History   Socioeconomic History  . Marital status: Single    Spouse name: Not on file  . Number of children: Not on file  . Years of education: Not on file  . Highest education level: Not on file  Occupational History  . Not on file  Social Needs  . Financial resource strain: Not on file  . Food insecurity:    Worry: Not on file    Inability: Not on file  . Transportation needs:    Medical: Not on file    Non-medical: Not on file  Tobacco Use  . Smoking status: Current Every Day Smoker  . Smokeless tobacco: Never Used  Substance and Sexual Activity  . Alcohol use: No  . Drug use: Yes    Frequency: 7.0 times per week    Types: Methamphetamines, Heroin    Comment: Daily use where possible  . Sexual activity: Not Currently  Lifestyle  . Physical activity:    Days per week: Not on file    Minutes per session: Not on file  . Stress: Not on file  Relationships  . Social connections:    Talks on phone: Not on file    Gets together: Not on file    Attends religious service: Not on file    Active member of club or organization: Not on file    Attends  meetings of clubs or organizations: Not on file    Relationship status: Not on file  Other Topics Concern  . Not on file  Social History Narrative  . Not on file     Review of Systems: A 12 point ROS discussed and pertinent positives are indicated in the HPI above.  All other systems are negative.  Review of Systems  Constitutional: Negative for activity change and fever.  HENT: Negative for hearing loss and  tinnitus.   Eyes: Negative for visual disturbance.  Respiratory: Negative for shortness of breath and wheezing.   Cardiovascular: Negative for chest pain and palpitations.  Neurological: Negative for dizziness, speech difficulty, weakness, numbness and headaches.  Psychiatric/Behavioral: Negative for behavioral problems and confusion.    Vital Signs: BP (!) 85/55   Pulse 86   Temp 98.5 F (36.9 C) (Oral)   Resp 18   Ht  (1.803 m)   Wt 131 lb 6.3 oz (59.6 kg)   SpO2 100%   BMI 18.33 kg/m   Physical Exam  Constitutional: He is oriented to person, place, and time. He appears well-developed and well-nourished. No distress.  Cardiovascular: Normal rate, regular rhythm, normal heart sounds and intact distal pulses.  No murmur heard. Pulmonary/Chest: Effort normal and breath sounds normal. No respiratory distress. He has no wheezes.  Neurological: He is alert and oriented to person, place, and time.  Skin: Skin is warm and dry.  Psychiatric: He has a normal mood and affect. His behavior is normal. Judgment and thought content normal.  Nursing note and vitals reviewed.    MD Evaluation Airway: WNL Heart: WNL Abdomen: WNL Chest/ Lungs: WNL ASA  Classification: 4 Mallampati/Airway Score: One   Imaging: Dg Chest 2 View  Result Date: 01/14/2018 CLINICAL DATA:  Left chest pain EXAM: CHEST - 2 VIEW COMPARISON:  01/13/2018 FINDINGS: Heart and mediastinal contours are within normal limits. No focal opacities or effusions. No acute bony abnormality. IMPRESSION: No active cardiopulmonary disease. Electronically Signed   By: Charlett Nose M.D.   On: 01/14/2018 08:29   Dg Chest 2 View  Result Date: 01/13/2018 CLINICAL DATA:  Weakness, hungry, pain.  History of hepatitis-C. EXAM: CHEST - 2 VIEW COMPARISON:  None. FINDINGS: The heart size and mediastinal contours are within normal limits. Both lungs are clear. The visualized skeletal structures are unremarkable. IMPRESSION: Negative.  Electronically Signed   By: Awilda Metro M.D.   On: 01/13/2018 19:46   Ct Head Wo Contrast  Addendum Date: 01/14/2018   ADDENDUM REPORT: 01/14/2018 10:35 ADDENDUM: Study discussed by telephone with Dr. Shaune Pollack on 01/14/2018 at 1015 hours. 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 THEREFORE, the appearance is highly likely to reflect SEPTIC EMBOLI, with acute embolic related hemorrhage in the right superior frontal lobe. Electronically Signed   By: Odessa Fleming M.D.   On: 01/14/2018 10:35   Result Date: 01/14/2018 CLINICAL DATA:  42 year old male with altered level consciousness, lightheadedness and dizzy onset about 1 hour ago. EXAM: CT HEAD WITHOUT CONTRAST TECHNIQUE: Contiguous axial images were obtained from the base of the skull through the vertex without intravenous contrast. COMPARISON:  None. FINDINGS: Brain: Cytotoxic edema at the right anterior insula and frontal operculum. No associated hemorrhage or mass effect in those segments. Small amorphous 7 millimeter focus of hyperdensity at the right superior frontal gyrus on series 3, image 25 and sagittal image 26. On coronal images there is adjacent hypodensity (coronal image 34). No regional mass effect. No other  cytotoxic edema identified. ASPECTS 7 (abnormal insula, M4, M 5). More chronic appearing left corona radiata white matter hypodensity on the left. No intracranial mass effect or midline shift. No ventriculomegaly. Normal basilar cisterns. Vascular: Abnormal cylindrical hyperdensity at the right MCA proximal M2 segment with Vessel expansion (series 3, image 14 and series 6, image 21). Skull: No acute osseous abnormality identified. Sinuses/Orbits: Visualized paranasal sinuses and mastoids are well pneumatized. Other: Mildly Disconjugate gaze, otherwise negative orbits soft tissues. No acute scalp soft tissue findings, scattered benign appearing scalp dermal nodularity. IMPRESSION: 1. Evidence of large vessel occlusion at the  right MCA proximal M2. 2. Possible trace focus of acute hemorrhage in the right superior frontal gyrus on series 3, image 25. No other acute intracranial hemorrhage identified. 3. Right MCA territory cytotoxic edema, ASPECTS 7 (although note suspicion of trace acute hemorrhage in #2). 4. Chronic appearing left MCA white matter disease. Electronically Signed: By: Odessa Fleming M.D. On: 01/14/2018 10:18   Mr Maxine Glenn Head Wo Contrast  Result Date: 01/14/2018 CLINICAL DATA:  In hospital for sepsis, altered mental status. History of hepatitis-C, polysubstance abuse and endocarditis. History of head injury 1 month ago. Follow-up RIGHT MCA stroke. EXAM: MRI HEAD WITHOUT CONTRAST MRA HEAD WITHOUT CONTRAST TECHNIQUE: Multiplanar, multiecho pulse sequences of the brain and surrounding structures were obtained without intravenous contrast. Angiographic images of the head were obtained using MRA technique without contrast. COMPARISON:  CT HEAD Jan 14, 2018 at 0951 hours. FINDINGS: MRI HEAD FINDINGS Multiple sequences are mildly or moderately motion degraded. INTRACRANIAL CONTENTS: Patchy reduced diffusion RIGHT frontal lobe and RIGHT basal ganglia, LEFT posterior frontal lobe with normalized ADC values and focal T2 shine through. Associated FLAIR T2 hyperintense signal and faint hemosiderin staining. Slightly expanded FLAIR T2 hyperintense RIGHT basal ganglia. Superimposed focal susceptibility artifact bifrontal lobes, RIGHT parietooccipital lobe. Multiple additional scattered micro hemorrhages. Mild parenchymal brain volume loss. No hydrocephalus. Old small RIGHT cerebellar infarcts. VASCULAR: Susceptibility artifact and T1 shortening RIGHT sylvian fissure corresponding to MCA density. SKULL AND UPPER CERVICAL SPINE: No abnormal sellar expansion. No suspicious calvarial bone marrow signal. Craniocervical junction maintained. SINUSES/ORBITS: The mastoid air-cells and included paranasal sinuses are well-aerated.The included ocular  globes and orbital contents are non-suspicious. OTHER: None. MRA HEAD FINDINGS ANTERIOR CIRCULATION: Normal flow related enhancement of the included cervical, petrous, cavernous and supraclinoid internal carotid arteries. Patent anterior communicating artery. Mildly attenuated RIGHT MCA flow related enhancement, occluded RIGHT M2 segment with transient aneurysmal reconstitution. No flow limiting stenosis. POSTERIOR CIRCULATION: vertebral artery is dominant. Basilar artery is patent, with normal flow related enhancement of the main branch vessels. Patent posterior cerebral arteries. Robust RIGHT and smaller LEFT posterior communicating arteries present. No large vessel occlusion, flow limiting stenosis,  aneurysm. ANATOMIC VARIANTS: None. Source images and MIP images were reviewed. IMPRESSION: MRI HEAD: 1. Multifocal subacute bilateral MCA/watershed territory infarcts. 2. Peripheral susceptibility artifact, septic emboli suspected. There may be superimposed shear injury or underlying chronic hypertension. 3. Mild parenchymal brain volume loss for age. 4. Old small RIGHT cerebellar infarcts. MRA HEAD: 1. 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 2. Otherwise negative MRA head. Examination reviewed by Dr. Augusto Gamble with Dr. Wilford Corner, Neurology on Jan 14, 2018 at approximately 1425 hours. Electronically Signed   By: Awilda Metro M.D.   On: 01/14/2018 14:38   Mr Brain Wo Contrast  Result Date: 01/14/2018 CLINICAL DATA:  In hospital for sepsis, altered mental status. History of hepatitis-C,  polysubstance abuse and endocarditis. History of head injury 1 month ago. Follow-up RIGHT MCA stroke. EXAM: MRI HEAD WITHOUT CONTRAST MRA HEAD WITHOUT CONTRAST TECHNIQUE: Multiplanar, multiecho pulse sequences of the brain and surrounding structures were obtained without intravenous contrast. Angiographic images of the head were  obtained using MRA technique without contrast. COMPARISON:  CT HEAD Jan 14, 2018 at 0951 hours. FINDINGS: MRI HEAD FINDINGS Multiple sequences are mildly or moderately motion degraded. INTRACRANIAL CONTENTS: Patchy reduced diffusion RIGHT frontal lobe and RIGHT basal ganglia, LEFT posterior frontal lobe with normalized ADC values and focal T2 shine through. Associated FLAIR T2 hyperintense signal and faint hemosiderin staining. Slightly expanded FLAIR T2 hyperintense RIGHT basal ganglia. Superimposed focal susceptibility artifact bifrontal lobes, RIGHT parietooccipital lobe. Multiple additional scattered micro hemorrhages. Mild parenchymal brain volume loss. No hydrocephalus. Old small RIGHT cerebellar infarcts. VASCULAR: Susceptibility artifact and T1 shortening RIGHT sylvian fissure corresponding to MCA density. SKULL AND UPPER CERVICAL SPINE: No abnormal sellar expansion. No suspicious calvarial bone marrow signal. Craniocervical junction maintained. SINUSES/ORBITS: The mastoid air-cells and included paranasal sinuses are well-aerated.The included ocular globes and orbital contents are non-suspicious. OTHER: None. MRA HEAD FINDINGS ANTERIOR CIRCULATION: Normal flow related enhancement of the included cervical, petrous, cavernous and supraclinoid internal carotid arteries. Patent anterior communicating artery. Mildly attenuated RIGHT MCA flow related enhancement, occluded RIGHT M2 segment with transient aneurysmal reconstitution. No flow limiting stenosis. POSTERIOR CIRCULATION: vertebral artery is dominant. Basilar artery is patent, with normal flow related enhancement of the main branch vessels. Patent posterior cerebral arteries. Robust RIGHT and smaller LEFT posterior communicating arteries present. No large vessel occlusion, flow limiting stenosis,  aneurysm. ANATOMIC VARIANTS: None. Source images and MIP images were reviewed. IMPRESSION: MRI HEAD: 1. Multifocal subacute bilateral MCA/watershed territory  infarcts. 2. Peripheral susceptibility artifact, septic emboli suspected. There may be superimposed shear injury or underlying chronic hypertension. 3. Mild parenchymal brain volume loss for age. 4. Old small RIGHT cerebellar infarcts. MRA HEAD: 1. 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 2. Otherwise negative MRA head. Examination reviewed by Dr. Augusto Gamble with Dr. Wilford Corner, Neurology on Jan 14, 2018 at approximately 1425 hours. Electronically Signed   By: Awilda Metro M.D.   On: 01/14/2018 14:38   Dg Chest Port 1 View  Result Date: 01/15/2018 CLINICAL DATA:  Shortness of breath.  Acute mental status change. EXAM: PORTABLE CHEST 1 VIEW COMPARISON:  Jan 14, 2018 FINDINGS: No pneumothorax. Vascular crowding in the medial right lung base. No suspicious infiltrate. Mild atelectasis in the left base. The cardiomediastinal silhouette is stable. No other changes. IMPRESSION: Mild atelectasis in the left base.  No other significant change. Electronically Signed   By: Gerome Sam III M.D   On: 01/15/2018 07:40    Labs:  CBC: Recent Labs    01/12/18 1938 01/13/18 1832 01/14/18 0744 01/14/18 1105 01/15/18 0653  WBC 11.2* 9.8 11.9*  --  11.5*  HGB 8.1* 8.1* 8.2*  --  8.2*  HCT 26.3* 26.2* 27.3*  --  27.1*  PLT 338 385 410* 431* 417*    COAGS: Recent Labs    01/12/18 2259 01/14/18 0904 01/14/18 1105 01/15/18 0653  INR 1.57 1.76 1.87 1.98  APTT  --   --  43*  --     BMP: Recent Labs    01/12/18 1938 01/13/18 1832 01/14/18 0744 01/15/18 0653  NA 133* 135 134* 139  K 3.7 4.3 3.8 3.6  CL 102 101 100* 108  CO2 21* GLUCOSE 99 102* 103* 99  BUN 5*  CALCIUM 8.1* 8.3* 8.4* 8.3*  CREATININE 0.64 0.74 0.71 0.74  GFRNONAA >60 >60 >60 >60  GFRAA >60 >60 >60 >60    LIVER FUNCTION TESTS: Recent Labs    01/29/17 1903 06/28/17 2139 01/14/18 1006  BILITOT 0.5 0.5 0.6    AST 19 41 24  ALT 22 64* 32  ALKPHOS 61 73 57  PROT 6.9 8.0 6.8  ALBUMIN 3.8 3.9 1.8*    TUMOR MARKERS: No results for input(s): AFPTM, CEA, CA199, CHROMGRNA in the last 8760 hours.  Assessment and Plan:  Developing mycotic aneurysm. Plan for image-guided diagnostic cerebral angiogram 01/18/2018 with Dr. Corliss Skains. Patient will be NPO at midnight on 01/17/2018. INR 1.98 today, patient to receive vitamin K today and will recheck INR Sunday 01/17/2018 afternoon.  Risks and benefits of cerebral angiogram were discussed with the patient including, but not limited to bleeding, infection, vascular injury or contrast induced renal failure. This interventional procedure involves the use of X-rays and because of the nature of the planned procedure, it is possible that we will have prolonged use of X-ray fluoroscopy. Potential radiation risks to you include (but are not limited to) the following: - A slightly elevated risk for cancer  several years later in life. This risk is typically less than 0.5% percent. This risk is low in comparison to the normal incidence of human cancer, which is 33% for women and 50% for men according to the [employer]. - Radiation induced injury can include skin redness, resembling a rash, tissue breakdown / ulcers and hair loss (which can be temporary or permanent).  The likelihood of either of these occurring depends on the difficulty of the procedure and whether you are sensitive to radiation due to previous procedures, disease, or genetic conditions.  IF your procedure requires a prolonged use of radiation, you will be notified and given written instructions for further action.  It is your responsibility to monitor the irradiated area for the 2 weeks following the procedure and to notify your physician if you are concerned that you have suffered a radiation induced injury.   All of the patient's questions were answered, patient is agreeable to  proceed. Consent signed and in chart.  Thank you for this interesting consult.  I greatly enjoyed meeting Marcus [name] and look forward to participating in their care.  A copy of this report was sent to the requesting provider on this date.  Electronically Signed: Elwin Mocha, PA-C 01/15/2018, 3:32 PM   I spent a total of 20 Minutes in face to face in clinical consultation, greater than 50% of which was counseling/coordinating care for developing mycotic aneurysm.

## 2018-01-15 NOTE — Progress Notes (Signed)
eLink Physician-Brief Progress Note Patient Name: Marcus [name] DOB: [birth year] MRN: [id number]   Date of Service  01/15/2018  HPI/Events of Note  Mild pain  eICU Interventions  APAP prn     Intervention Category Intermediate Interventions: Pain - evaluation and management  Max Fickle 01/15/2018, 1:01 AM

## 2018-01-15 NOTE — Progress Notes (Signed)
Pharmacy Antibiotic Note  Marcus [name] is a 42 y.o. male admitted on 01/14/2018 with sepsis.  Pharmacy has been consulted for vancomycin and zosyn dosing.  Bcx from 4/30 at [facility] grew strep pneumonia (I cefuroxime, R Bactrim). 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 CT head today found to have large vessel occlusion of R MCA proximal M2 with possible trace focus of acute hemorrhage in R superior frontal gyrus. WBC is 11.9, afebrile, LA is 0.83. Antibiotic therapy was changed to vancomycin and zosyn.   Vancomycin trough this evening = 18 - > to continue current dose  Plan: Vancomycin 1000 mg IV every 8 hours.  Goal trough 15-20 mcg/mL. Zosyn 3.375g IV q8h (4 hour infusion). Monitor renal function, cx results, clinical pic, VT prn  Height:  (180.3 cm) Weight: 131 lb 6.3 oz (59.6 kg) IBW/kg (Calculated) : 75.3  Temp (24hrs), Avg:98.4 F (36.9 C), Min:98.1 F (36.7 C), Max:98.6 F (37 C)  Recent Labs  Lab 01/12/18 1938 01/13/18 1832 01/14/18 0744 01/14/18 1031 01/15/18 0653 01/15/18 1941  WBC 11.2* 9.8 11.9*  --  11.5*  --   CREATININE 0.64 0.74 0.71  --  0.74  --   LATICACIDVEN  --   --   --  0.83  --   --   VANCOTROUGH  --   --   --   --   --  18    Estimated Creatinine Clearance: 103.5 mL/min (by C-G formula based on SCr of 0.74 mg/dL).    Allergies  Allergen Reactions  . Bee Venom Anaphylaxis  . Food Hives and Other (See Comments)    grapefruit  . Penicillins Other (See Comments)    Possible reaction - pt unsure Has patient had a PCN reaction causing immediate rash, facial/tongue/throat swelling, SOB or lightheadedness with hypotension: unk Has patient had a PCN reaction causing severe rash involving mucus membranes or skin necrosis: unk Has patient had a PCN reaction that required hospitalization: unk Has patient had a PCN reaction occurring within the last 10 years: No If all of the  above answers are "NO", then may proceed with Cephalosporin use.   . Tylenol [Acetaminophen] Other (See Comments)    Pt states that he is not supposed to take this medication because of his Hep C.     Antimicrobials this admission: Vancomycin 5/16 >>  Zosyn 5/16 >>   Dose adjustments this admission: N/A  Microbiology results: 5/16 BCx: [id number]  Thank you Okey Regal, PharmD [phone] 01/15/2018 8:40 PM

## 2018-01-15 NOTE — Progress Notes (Signed)
PULMONARY / CRITICAL CARE MEDICINE   Name: Marcus [name] MRN: [id number] DOB: 08-25-1976    ADMISSION DATE:  01/14/2018     CHIEF COMPLAINT: Patient come  with exacerbation of his back pain. He has chronic back pain since an injury a few years ago snowborading    HISTORY OF PRESENT ILLNESS:  The patient presented with altered mental status as well. He has become mopre awake the longer he is in the ED   he patient was discharge < 1day ago from [facility]. The patient is 42 yo homeless man. 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 The patient had a vegetationon theposterior leaflet of his mitral valve. 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 He ha been on anticoagualtion because of that with coumadin. Imaging alsop has shown multiple splenic and bilateral kidney infarcts. His INR today was 1.57.  Subjective: Pain, no events, no new complaints   VITAL SIGNS: BP 101/63   Pulse 89   Temp 98.5 F (36.9 C) (Oral)   Resp 15   Ht  (1.803 m)   Wt 131 lb 6.3 oz (59.6 kg)   SpO2 100%   BMI 18.33 kg/m     INTAKE / OUTPUT: I/O last 3 completed shifts: In: 1388.8 [I.V.:588.8; IV Piggyback:800] Out: 1200 [Urine:1200]  PHYSICAL EXAMINATION: General:  Young disheveled male, NAD Neuro:  Alert and interactive, moving all ext to command HEENT:  [place]/AT, PERRL, EOM-I and MMM Cardiovascular: RRR, Nl S1/S2 and -M/R/G Lungs:  CTA bilaterally Abdomen: Soft, NT, ND and +BS Musculoskeletal: Unremarkable Skin: Intact  LABS:  BMET Recent Labs  Lab 01/13/18 1832 01/14/18 0744 01/15/18 0653  NA 135 134* 139  K 4.3 3.8 3.6  CL 101 100* 108  CO2 BUN 10 8 5*  CREATININE 0.74 0.71 0.74  GLUCOSE 102* 103* 99   Electrolytes Recent Labs  Lab 01/13/18 1832 01/14/18 0744 01/15/18 0653  CALCIUM 8.3* 8.4* 8.3*  MG  --   --  1.8  PHOS  --   --  3.6    CBC Recent Labs  Lab 01/13/18 1832 01/14/18 0744 01/14/18 1105 01/15/18 0653  WBC 9.8 11.9*  --  11.5*  HGB 8.1* 8.2*  --  8.2*  HCT 26.2* 27.3*  --  27.1*  PLT 385 410* 431* 417*   Coag's Recent Labs  Lab 01/14/18 0904 01/14/18 1105 01/15/18 0653  APTT  --  43*  --   INR 1.76 1.87 1.98   Sepsis Markers Recent Labs  Lab 01/14/18 1031  LATICACIDVEN 0.83   ABG Recent Labs  Lab 01/15/18 0445  PHART 7.410  PCO2ART 37.0  PO2ART 92.0   Liver Enzymes Recent Labs  Lab 01/14/18 1006  AST 24  ALT 32  ALKPHOS 57  BILITOT 0.6  ALBUMIN 1.8*   Cardiac Enzymes No results for input(s): TROPONINI, PROBNP in the last 168 hours.  Glucose Recent Labs  Lab 01/12/18 1937  GLUCAP 92   Imaging Mr Maxine Glenn Head Wo Contrast  Result Date: 01/14/2018 CLINICAL DATA:  In hospital for sepsis, altered mental status. History of hepatitis-C, polysubstance abuse and endocarditis. History of head injury 1 month ago. Follow-up RIGHT MCA stroke. EXAM: MRI HEAD WITHOUT CONTRAST MRA HEAD WITHOUT CONTRAST TECHNIQUE: Multiplanar, multiecho pulse sequences of the brain and surrounding structures were obtained without intravenous contrast. Angiographic images of the head were  obtained using MRA technique without contrast. COMPARISON:  CT HEAD Jan 14, 2018 at 0951 hours. FINDINGS: MRI HEAD FINDINGS Multiple sequences are mildly or moderately motion degraded. INTRACRANIAL CONTENTS: Patchy reduced diffusion RIGHT frontal lobe and RIGHT basal ganglia, LEFT posterior frontal lobe with normalized ADC values and focal T2 shine through. Associated FLAIR T2 hyperintense signal and faint hemosiderin staining. Slightly expanded FLAIR T2 hyperintense RIGHT basal ganglia. Superimposed focal susceptibility artifact bifrontal lobes, RIGHT parietooccipital lobe. Multiple additional scattered micro hemorrhages. Mild parenchymal brain volume loss. No hydrocephalus. Old small RIGHT cerebellar infarcts. VASCULAR:  Susceptibility artifact and T1 shortening RIGHT sylvian fissure corresponding to MCA density. SKULL AND UPPER CERVICAL SPINE: No abnormal sellar expansion. No suspicious calvarial bone marrow signal. Craniocervical junction maintained. SINUSES/ORBITS: The mastoid air-cells and included paranasal sinuses are well-aerated.The included ocular globes and orbital contents are non-suspicious. OTHER: None. MRA HEAD FINDINGS ANTERIOR CIRCULATION: Normal flow related enhancement of the included cervical, petrous, cavernous and supraclinoid internal carotid arteries. Patent anterior communicating artery. Mildly attenuated RIGHT MCA flow related enhancement, occluded RIGHT M2 segment with transient aneurysmal reconstitution. No flow limiting stenosis. POSTERIOR CIRCULATION: vertebral artery is dominant. Basilar artery is patent, with normal flow related enhancement of the main branch vessels. Patent posterior cerebral arteries. Robust RIGHT and smaller LEFT posterior communicating arteries present. No large vessel occlusion, flow limiting stenosis,  aneurysm. ANATOMIC VARIANTS: None. Source images and MIP images were reviewed. IMPRESSION: MRI HEAD: 1. Multifocal subacute bilateral MCA/watershed territory infarcts. 2. Peripheral susceptibility artifact, septic emboli suspected. There may be superimposed shear injury or underlying chronic hypertension. 3. Mild parenchymal brain volume loss for age. 4. Old small RIGHT cerebellar infarcts. MRA HEAD: 1. 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 2. Otherwise negative MRA head. Examination reviewed by Dr. Augusto Gamble with Dr. Wilford Corner, Neurology on Jan 14, 2018 at approximately 1425 hours. Electronically Signed   By: Awilda Metro M.D.   On: 01/14/2018 14:38   Mr Brain Wo Contrast  Result Date: 01/14/2018 CLINICAL DATA:  In hospital for sepsis, altered mental status. History of  hepatitis-C, polysubstance abuse and endocarditis. History of head injury 1 month ago. Follow-up RIGHT MCA stroke. EXAM: MRI HEAD WITHOUT CONTRAST MRA HEAD WITHOUT CONTRAST TECHNIQUE: Multiplanar, multiecho pulse sequences of the brain and surrounding structures were obtained without intravenous contrast. Angiographic images of the head were obtained using MRA technique without contrast. COMPARISON:  CT HEAD Jan 14, 2018 at 0951 hours. FINDINGS: MRI HEAD FINDINGS Multiple sequences are mildly or moderately motion degraded. INTRACRANIAL CONTENTS: Patchy reduced diffusion RIGHT frontal lobe and RIGHT basal ganglia, LEFT posterior frontal lobe with normalized ADC values and focal T2 shine through. Associated FLAIR T2 hyperintense signal and faint hemosiderin staining. Slightly expanded FLAIR T2 hyperintense RIGHT basal ganglia. Superimposed focal susceptibility artifact bifrontal lobes, RIGHT parietooccipital lobe. Multiple additional scattered micro hemorrhages. Mild parenchymal brain volume loss. No hydrocephalus. Old small RIGHT cerebellar infarcts. VASCULAR: Susceptibility artifact and T1 shortening RIGHT sylvian fissure corresponding to MCA density. SKULL AND UPPER CERVICAL SPINE: No abnormal sellar expansion. No suspicious calvarial bone marrow signal. Craniocervical junction maintained. SINUSES/ORBITS: The mastoid air-cells and included paranasal sinuses are well-aerated.The included ocular globes and orbital contents are non-suspicious. OTHER: None. MRA HEAD FINDINGS ANTERIOR CIRCULATION: Normal flow related enhancement of the included cervical, petrous, cavernous and supraclinoid internal carotid arteries. Patent anterior communicating artery. Mildly attenuated RIGHT MCA flow related enhancement, occluded RIGHT M2 segment with transient aneurysmal reconstitution. No flow limiting  stenosis. POSTERIOR CIRCULATION: vertebral artery is dominant. Basilar artery is patent, with normal flow related enhancement of the  main branch vessels. Patent posterior cerebral arteries. Robust RIGHT and smaller LEFT posterior communicating arteries present. No large vessel occlusion, flow limiting stenosis,  aneurysm. ANATOMIC VARIANTS: None. Source images and MIP images were reviewed. IMPRESSION: MRI HEAD: 1. Multifocal subacute bilateral MCA/watershed territory infarcts. 2. Peripheral susceptibility artifact, septic emboli suspected. There may be superimposed shear injury or underlying chronic hypertension. 3. Mild parenchymal brain volume loss for age. 4. Old small RIGHT cerebellar infarcts. MRA HEAD: 1. 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 2. Otherwise negative MRA head. Examination reviewed by Dr. Augusto Gamble with Dr. Wilford Corner, Neurology on Jan 14, 2018 at approximately 1425 hours. Electronically Signed   By: Awilda Metro M.D.   On: 01/14/2018 14:38   Dg Chest Port 1 View  Result Date: 01/15/2018 CLINICAL DATA:  Shortness of breath.  Acute mental status change. EXAM: PORTABLE CHEST 1 VIEW COMPARISON:  Jan 14, 2018 FINDINGS: No pneumothorax. Vascular crowding in the medial right lung base. No suspicious infiltrate. Mild atelectasis in the left base. The cardiomediastinal silhouette is stable. No other changes. IMPRESSION: Mild atelectasis in the left base.  No other significant change. Electronically Signed   By: Gerome Sam III M.D   On: 01/15/2018 07:40    ECHO  01/14/18 Study Conclusions  - Left ventricle: The cavity size was severely dilated. Systolic   function was normal. The estimated ejection fraction was in the   range of 55% to 60%. Wall motion was normal; there were no   regional wall motion abnormalities. Left ventricular diastolic   function parameters were normal. - Aortic valve: Transvalvular velocity was within the normal range.   There was no stenosis. There was no regurgitation. - Mitral valve: There  was a medium-sized, 1.4 cm (L) x 0.4 cm (W),   mobile vegetation on the posterior leaflet. There was severe   regurgitation directed posteriorly. - Left atrium: The atrium was severely dilated. - Right ventricle: The cavity size was normal. Wall thickness was   normal. Systolic function was normal. - Tricuspid valve: There was mild regurgitation. - Pulmonary arteries: Systolic pressure was mildly increased. PA   peak pressure: 42 mm Hg (S).  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 Consider TEE to better   evaluate if clinically indicated  I reviewed CXR myself, no acute disease noted  ASSESSMENT / PLAN: 42 year old with endocarditis and septic emboli to the brain with some hemorrhagic conversion.  Neuro is following.  Will d/c anticoagulation and allow INR to normalize then will go for IR on Monday per neuro.   ID: endocarditis with brain septic emboli  - Vanc  - Zosyn  - F/U on cultures  Endocarditis:  - Abx  - CVTS  Septic emboli to the brain:  - Abx  - Neuro IR to see to take to lab on Monday   Hemorrhagic CVA:  - Neuro following  - No anti-epileptics for now  Discussed with PCCM-NP and TRH-MD.  Transfer SDU and to [facility] service with PCCM off 5/18.  Alyson Reedy, M.D. [facility] Pulmonary/Critical Care Medicine. Pager: [phone]. After hours pager: [phone].  01/15/2018, 12:41 PM

## 2018-01-15 NOTE — Progress Notes (Addendum)
STROKE TEAM - DAILY PROGRESS NOTE    HISTORY Marcus [name] is a 42 y.o. 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 He was being evaluated in the emergency room, and as part of work-up, because he appeared a little encephalopathic, a noncontrast CT head was performed. I got a page from neuroradiology-Dr. Margo Aye, stating that the patient's noncontrast CT of the head shows hyperdense right MCA as well as early changes in the right MCA territory with an aspect score of 7. The patient had no neurological complaints.  LKW: 1 AM on 01/14/2018 tpa given?: no, outside the window NIHSS: 0 Premorbid modified Rankin scale (mRS): 0  SUBJECTIVE Patient seen in ICU in bed, he is requesting to eat.  Patient denies headache, dizziness, nausea, vomiting, ocular pain.  Admits to feeling generally weak states he has been in jail and in the hospital for the last 2 months.  OBJECTIVE Most recent Vital Signs: Vitals:   01/15/18 0800 01/15/18 0818 01/15/18 0900 01/15/18 1207  BP: 97/70  101/63   Pulse:   89   Resp: 16  15   Temp:  98.1 F (36.7 C)  98.5 F (36.9 C)  TempSrc:  Oral  Oral  SpO2:   100%   Weight:      Height:       CBG (last 3)  Recent Labs    01/12/18 1937  GLUCAP 92    Physical Exam  HEENT-  Normocephalic, no lesions, without obvious abnormality.  Normal external eye and conjunctiva.   Cardiovascular- S1-S2 audible, pulses palpable throughout, III/VI systolic murmur   Lungs-no rhonchi or wheezing noted, no excessive working breathing.  Saturations within normal limits Abdomen- All 4 quadrants palpated and nontender Musculoskeletal-no joint tenderness, deformity or swelling   Neuro:  Mental Status: Alert,  oriented, thought content appropriate.  Speech fluent without evidence of aphasia.  Able to follow 3 step commands without difficulty. Cranial Nerves: II:  Visual fields grossly normal,  III,IV, VI: ptosis not present, extra-ocular motions intact bilaterally pupils equal, round, reactive to light and accommodation V,VII: smile symmetric, facial light touch sensation normal bilaterally VIII: hearing intact to voice IX,X: uvula rises symmetrically XI: bilateral shoulder shrug XII: midline tongue extension Motor: PT with generalized weakness in all extrmeities Right : Upper extremity  4/5    Left:     Upper extremity   4/5  Lower extremity   4/5     Lower extremity   4/5 Tone and bulk:normal tone throughout; no atrophy noted Sensory: Sensation intact to temperature Deep Tendon Reflexes: 2+ and symmetric throughout Plantars: Right: downgoing   Left: downgoing Cerebellar: normal finger-to-nose, normal rapid alternating movements Gait: Not tested  IV Fluid Intake:   . sodium chloride 250 mL (01/15/18 0609)  . sodium chloride 75 mL/hr at 01/15/18 0600  . famotidine (PEPCID) IV Stopped (01/14/18 2243)  . piperacillin-tazobactam (ZOSYN)  IV 3.375 g (01/15/18 0454)  . vancomycin Stopped (01/15/18 0554)    MEDICATIONS  . Chlorhexidine  Gluconate Cloth  6 each Topical [id number]  . mupirocin ointment  1 application Nasal BID  . senna-docusate  1 tablet Oral BID   PRN:  sodium chloride, acetaminophen, iopamidol  Diet:   Diet Order           Diet regular Room service appropriate? Yes; Fluid consistency: Thin  Diet effective now          CLINICALLY SIGNIFICANT STUDIES Basic Metabolic Panel:  Recent Labs  Lab 01/14/18 0744 01/15/18 0653  NA 134* 139  K 3.8 3.6  CL 100* 108  CO2 26 25  GLUCOSE 103* 99  BUN 8 5*  CREATININE 0.71 0.74  CALCIUM 8.4* 8.3*  MG  --  1.8  PHOS  --  3.6   Liver Function Tests:  Recent Labs  Lab 01/14/18 1006  AST 24  ALT 32  ALKPHOS 57  BILITOT  0.6  PROT 6.8  ALBUMIN 1.8*   CBC:  Recent Labs  Lab 01/14/18 0744 01/14/18 1105 01/15/18 0653  WBC 11.9*  --  11.5*  HGB 8.2*  --  8.2*  HCT 27.3*  --  27.1*  MCV 86.9  --  87.7  PLT 410* 431* 417*   Coagulation:  Recent Labs  Lab 01/12/18 2259 01/14/18 0904 01/14/18 1105 01/15/18 0653  LABPROT 18.7* 20.3* 21.4* 22.3*  INR 1.57 1.76 1.87 1.98   Cardiac Enzymes: No results for input(s): CKTOTAL, CKMB, CKMBINDEX, TROPONINI in the last 168 hours. Urinalysis:  Recent Labs  Lab 01/12/18 2050  COLORURINE YELLOW  LABSPEC 1.014  PHURINE 5.0  GLUCOSEU NEGATIVE  HGBUR NEGATIVE  BILIRUBINUR NEGATIVE  KETONESUR NEGATIVE  PROTEINUR NEGATIVE  NITRITE NEGATIVE  LEUKOCYTESUR NEGATIVE   Lipid Panel    Component Value Date/Time   CHOL 100 01/15/2018 0653   TRIG 43 01/15/2018 0653   HDL 27 (L) 01/15/2018 0653   CHOLHDL 3.7 01/15/2018 0653   VLDL 9 01/15/2018 0653   LDLCALC 64 01/15/2018 0653   HgbA1C  Lab Results  Component Value Date   HGBA1C 4.8 01/15/2018    Urine Drug Screen:      Component Value Date/Time   LABOPIA POSITIVE (A) 01/14/2018 2248   COCAINSCRNUR NONE DETECTED 01/14/2018 2248   LABBENZ NONE DETECTED 01/14/2018 2248   AMPHETMU NONE DETECTED 01/14/2018 2248   THCU NONE DETECTED 01/14/2018 2248   LABBARB NONE DETECTED 01/14/2018 2248    Alcohol Level:  Recent Labs  Lab 01/14/18 1006  ETH <10    Dg Chest 2 View 01/14/2018 IMPRESSION:  No active cardiopulmonary disease.   Ct Head Wo Contrast 01/14/2018   ADDENDUM REPORT: 01/14/2018 10:35 He advises a history of Endocarditis in this patient. THEREFORE, the appearance is highly likely to reflect SEPTIC EMBOLI, with acute embolic related hemorrhage in the right superior frontal lobe. IMPRESSION:  1. 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  2. Possible trace focus of acute hemorrhage in the right superior frontal gyrus on series 3, image 25. No other acute intracranial  hemorrhage identified.  3. Right MCA territory cytotoxic edema, ASPECTS 7 (although note suspicion of trace acute hemorrhage in #2).  4. Chronic appearing left MCA white matter disease.   Mr Maxine Glenn Head Wo Contrast 01/14/2018 IMPRESSION:  1. 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  2 . Otherwise negative MRA head.  Mr Brain Wo Contrast: 01/14/2018 IMPRESSION 1. Multifocal subacute bilateral MCA/watershed territory infarcts.  2. Peripheral susceptibility  artifact, septic emboli suspected. There may be superimposed shear injury or underlying chronic hypertension.  3. Mild parenchymal brain volume loss for age.  4. Old small RIGHT cerebellar infarcts.   Echo 01/14/18 Impressions: - Findings consisent with endocarditis of the mitral valve with associated severe mitral regurgitation. Consider TEE to better evaluate if clinically indicated.  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  Noncontrast head CT shows a hyperdense M1 with a small area of poor density may be reflecting a bleed versus a mycotic aneurysm as well as changes of stroke in the right MCA territory.  Stroke - bilateral MCA subacute infarcts as well as right frontal high convexity small hemorrhage - likely related to septic emboli as well as mycotic aneurysm.  CT head: Evidence of large vessel occlusion at the right MCA proximal M2 - likely septic emboli. Possible trace focus of acute hemorrhage in the right superior frontal gyrus   MRA head:Subacute thromboembolism resulting in RIGHT M2 occlusion, given aneurysmal appearance this is concerning  for developing mycotic aneurysm.  MRI Brain: Multifocal subacute bilateral MCA/watershed territory infarcts and right frontal high convexity small hemorrhage  2D Echo : mobile vegetation at posterior leaflet, associated severe mitral regurgitation.   LDL: 64  HgbA1c: 4.8  UDS: positive for opiates  VTE Prophylaxis:SCD  No anticoagulation at this time in the setting of septic emboli with hemorrhagic conversion and large mycotic aneurysm. Coumadin and ASA discontinued.    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 Corliss Skains aware and plan for angio Monday  Septic emboli  12/29/17 CTA abd/pelvis - abdominal aorta occlusion inferior to the inferior mesenteric artery, as well as the splenic artery, and the branch of the superior mesenteric artery, concerning for thrombus.  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 Currently, coumadin discontinued due to ICH. No need to reverse INR. Will let it trending down. 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  Blood culture  NGTD  Currently on IV Vanc and Zosyn   Other Stroke Risk Factors  Substance abuse   ? 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 I have made any additions or clarifications directly to the above note. Pt was seen and examined.   42 year old male with history of PTSD, depression, suicidal ideation, substance abuse, hepC, hepB and recent infective MV endocarditis with septic thrombus on Coumadin consulted for subacute strokes and hemorrhagic conversion.  Patient CT showed possible right M2 occlusion and right frontal high convexity small hemorrhage.  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  MRI brain showed subacute bilateral MCA infarcts and confirmed right frontal high convexity small hemorrhage.  2D echo continues to show mitral wall mobile vegetations.  LDL 64 and A1c 4.8.  UDS positive for opiates.  Blood culture no growth to date.  Given patient recent history of infectious endocarditis, septic emboli, aortic occlusion on Coumadin, patient stroke and aneurysm as well as small hemorrhage likely due to septic embolism and a mycotic aneurysm.  He is on Vanco and Zosyn for endocarditis treatment.  Neurologically intact.  Coumadin and aspirin discontinued given ICH and mycotic aneurysm.  Currently INR 1.98.  Discussed with Dr. Corliss Skains, not able to do cerebral angiogram at this time due to high INR.  We will continue monitor INR, once INR less than 1.5, will consider cerebral angiogram, likely plan on Monday.   Patient at high risk of aneurysm rupture, further stroke and septic emboli.   Recommend stat CT head if any neuro changes.  Okay for patient transfer to stepdown.  Will follow  Marvel Plan, MD PhD Stroke Neurology 01/15/2018 5:46 PM  I spent  35 minutes in total face-to-face time with the patient, more than 50% of which was spent in counseling and coordination of care, reviewing test results, images and medication, and discussing the diagnosis of infectious cut endocarditis, septic emboli, mycotic aneurysms treatment plan and potential prognosis. This patient's care requiresreview of multiple databases, neurological assessment, discussion with family, other specialists and medical decision making of high complexity.         To contact Stroke Continuity provider, please refer to [email or web address]. After hours, contact General Neurology

## 2018-01-16 DIAGNOSIS — F1910 Other psychoactive substance abuse, uncomplicated: Secondary | ICD-10-CM

## 2018-01-16 LAB — BASIC METABOLIC PANEL
Anion gap: 9 (ref 5–15)
BUN: 5 mg/dL — AB (ref 6–20)
CHLORIDE: 107 mmol/L (ref 101–111)
CO2: 23 mmol/L (ref 22–32)
CREATININE: 0.73 mg/dL (ref 0.61–1.24)
Calcium: 7.9 mg/dL — ABNORMAL LOW (ref 8.9–10.3)
GFR calc Af Amer: >60 mL/min (ref >60)
GFR calc non Af Amer: >60 mL/min (ref >60)
GLUCOSE: 102 mg/dL — AB (ref 65–99)
Potassium: 3.6 mmol/L (ref 3.5–5.1)
SODIUM: 139 mmol/L (ref 135–145)

## 2018-01-16 LAB — MAGNESIUM: Magnesium: 2 mg/dL (ref 1.7–2.4)

## 2018-01-16 LAB — CBC
HEMATOCRIT: 26.6 % — AB (ref 39.0–52.0)
HEMOGLOBIN: 8 g/dL — AB (ref 13.0–17.0)
MCH: 26.2 pg (ref 26.0–34.0)
MCHC: 30.1 g/dL (ref 30.0–36.0)
MCV: 87.2 fL (ref 78.0–100.0)
Platelets: 432 10*3/uL — ABNORMAL HIGH (ref 150–400)
RBC: 3.05 MIL/uL — ABNORMAL LOW (ref 4.22–5.81)
RDW: 16.1 % — ABNORMAL HIGH (ref 11.5–15.5)
WBC: 9.4 10*3/uL (ref 4.0–10.5)

## 2018-01-16 LAB — PHOSPHORUS: Phosphorus: 3.2 mg/dL (ref 2.5–4.6)

## 2018-01-16 MED ORDER — FAMOTIDINE 20 MG PO TABS
20.0000 mg | ORAL_TABLET | Freq: Two times a day (BID) | ORAL | Status: DC
  Administered 2018-01-16 – 2018-02-09 (×37): 20 mg via ORAL
  Filled 2018-01-16 (×42): qty 1

## 2018-01-16 MED ORDER — ENSURE ENLIVE PO LIQD
237.0000 mL | Freq: Three times a day (TID) | ORAL | Status: DC
  Administered 2018-01-16 – 2018-01-18 (×6): 237 mL via ORAL

## 2018-01-16 NOTE — Progress Notes (Signed)
STROKE PROGRESS NOTE   SUBJECTIVE Patient seen in ICU lying in bed comfortably. Moving all extremities freely. No acute event overnight. BP on the low side but asymptomatic, likely baseline. Will plan for diagnostic angio on Monday.  OBJECTIVE Most recent Vital Signs: Vitals:   01/16/18 0700 01/16/18 0800 01/16/18 0831 01/16/18 0848  BP: 92/61 96/64    Pulse: 88 84    Resp: 18 (!) 30    Temp:   98.3 F (36.8 C)   TempSrc:   Oral   SpO2: 97% 100%    Weight:    128 lb 12 oz (58.4 kg)  Height:       CBG (last 3)  No results for input(s): GLUCAP in the last 72 hours.  Physical Exam  HEENT-  Normocephalic, no lesions, without obvious abnormality.  Normal external eye and conjunctiva.   Cardiovascular- S1-S2 audible, pulses palpable throughout, III/VI systolic murmur   Lungs-no rhonchi or wheezing noted, no excessive working breathing.  Saturations within normal limits Abdomen- All 4 quadrants palpated and nontender Musculoskeletal-no joint tenderness, deformity or swelling   Neuro:  Mental Status: Alert, oriented, thought content appropriate.  Speech fluent without evidence of aphasia.  Able to follow 3 step commands without difficulty. Cranial Nerves: II:  Visual fields grossly normal,  III,IV, VI: ptosis not present, extra-ocular motions intact bilaterally pupils equal, round, reactive to light and accommodation V,VII: smile symmetric, facial light touch sensation normal bilaterally VIII: hearing intact to voice IX,X: uvula rises symmetrically XI: bilateral shoulder shrug XII: midline tongue extension Motor: PT with generalized weakness in all extrmeities Right : Upper extremity  4/5    Left:     Upper extremity   4/5  Lower extremity   4/5     Lower extremity   4/5 Tone and bulk:normal tone throughout; no atrophy noted Sensory: Sensation intact to temperature Deep Tendon Reflexes: 2+ and symmetric  throughout Plantars: Right: downgoing   Left: downgoing Cerebellar: normal finger-to-nose, normal rapid alternating movements Gait: Not tested  IV Fluid Intake:   . sodium chloride 250 mL (01/15/18 0609)  . sodium chloride 75 mL/hr at 01/16/18 0604  . famotidine (PEPCID) IV Stopped (01/15/18 2311)  . piperacillin-tazobactam (ZOSYN)  IV 3.375 g (01/16/18 0329)  . vancomycin Stopped (01/16/18 0429)    MEDICATIONS  . Chlorhexidine Gluconate Cloth  6 each Topical Q0600  . mupirocin ointment  1 application Nasal BID  . senna-docusate  1 tablet Oral BID   PRN:  sodium chloride, acetaminophen, iopamidol  Diet:   Diet Order           Diet NPO time specified  Diet effective midnight        Diet regular Room service appropriate? Yes; Fluid consistency: Thin  Diet effective now          CLINICALLY SIGNIFICANT STUDIES Basic Metabolic Panel:  Recent Labs  Lab 01/15/18 0653 01/16/18 0512  NA 139 139  K 3.6 3.6  CL 108 107  CO2 25 23  GLUCOSE 99 102*  BUN 5* 5*  CREATININE 0.74 0.73  CALCIUM 8.3* 7.9*  MG 1.8 2.0  PHOS 3.6 3.2   Liver Function Tests:  Recent Labs  Lab 01/14/18 1006  AST 24  ALT 32  ALKPHOS 57  BILITOT 0.6  PROT 6.8  ALBUMIN 1.8*   CBC:  Recent Labs  Lab 01/15/18 0653 01/16/18 0512  WBC 11.5* 9.4  HGB 8.2* 8.0*  HCT 27.1* 26.6*  MCV 87.7 87.2  PLT 417* 432*   Coagulation:  Recent Labs  Lab 01/12/18 2259 01/14/18 0904 01/14/18 1105 01/15/18 0653  LABPROT 18.7* 20.3* 21.4* 22.3*  INR 1.57 1.76 1.87 1.98   Cardiac Enzymes: No results for input(s): CKTOTAL, CKMB, CKMBINDEX, TROPONINI in the last 168 hours. Urinalysis:  Recent Labs  Lab 01/12/18 2050  COLORURINE YELLOW  LABSPEC 1.014  PHURINE 5.0  GLUCOSEU NEGATIVE  HGBUR NEGATIVE  BILIRUBINUR NEGATIVE  KETONESUR NEGATIVE  PROTEINUR NEGATIVE  NITRITE NEGATIVE  LEUKOCYTESUR NEGATIVE   Lipid Panel    Component Value Date/Time   CHOL 100 01/15/2018 0653   TRIG 43 01/15/2018  0653   HDL 27 (L) 01/15/2018 0653   CHOLHDL 3.7 01/15/2018 0653   VLDL 9 01/15/2018 0653   LDLCALC 64 01/15/2018 0653   HgbA1C  Lab Results  Component Value Date   HGBA1C 4.8 01/15/2018    Urine Drug Screen:      Component Value Date/Time   LABOPIA POSITIVE (A) 01/14/2018 2248   COCAINSCRNUR NONE DETECTED 01/14/2018 2248   LABBENZ NONE DETECTED 01/14/2018 2248   AMPHETMU NONE DETECTED 01/14/2018 2248   THCU NONE DETECTED 01/14/2018 2248   LABBARB NONE DETECTED 01/14/2018 2248    Alcohol Level:  Recent Labs  Lab 01/14/18 1006  ETH <10    Dg Chest 2 View 01/14/2018 IMPRESSION:  No active cardiopulmonary disease.   Ct Head Wo Contrast 01/14/2018   ADDENDUM REPORT: 01/14/2018 10:35 He advises a history of Endocarditis in this patient. THEREFORE, the appearance is highly likely to reflect SEPTIC EMBOLI, with acute embolic related hemorrhage in the right superior frontal lobe. IMPRESSION:  1. 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  2. Possible trace focus of acute hemorrhage in the right superior frontal gyrus on series 3, image 25. No other acute intracranial hemorrhage identified.  3. Right MCA territory cytotoxic edema, ASPECTS 7 (although note suspicion of trace acute hemorrhage in #2).  4. Chronic appearing left MCA white matter disease.   Mr Marcus [name] Head Wo Contrast 01/14/2018 IMPRESSION:  1. 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  2 . Otherwise negative MRA head.  Mr Brain Wo Contrast: 01/14/2018 IMPRESSION 1. Multifocal subacute bilateral MCA/watershed territory infarcts.  2. Peripheral susceptibility artifact, septic emboli suspected. There may be superimposed shear injury or underlying chronic hypertension.  3. Mild parenchymal brain volume loss for age.  4. Old small RIGHT cerebellar infarcts.   Echo 01/14/18 Impressions: - Findings consisent  with endocarditis of the mitral valve with associated severe mitral regurgitation. Consider TEE to better evaluate if clinically indicated.   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  Noncontrast head CT shows a hyperdense M1 with a small area of poor density may be reflecting a bleed versus a mycotic aneurysm as well as changes of stroke in the right MCA territory.  Stroke - bilateral MCA subacute infarcts as well  as right frontal high convexity small hemorrhage - likely related to septic emboli as well as mycotic aneurysm.  CT head: Evidence of large vessel occlusion at the right MCA proximal M2 - likely septic emboli. 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  MRI Brain: Multifocal subacute bilateral MCA/watershed territory infarcts and right frontal high convexity small hemorrhage  2D Echo : mobile vegetation at posterior leaflet, associated severe mitral regurgitation.   LDL: 64  HgbA1c: 4.8  UDS: positive for opiates  VTE Prophylaxis - SCD  No anticoagulation at this time in the setting of septic emboli with hemorrhagic conversion and large mycotic aneurysm. Coumadin and ASA discontinued.    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 Corliss Skains aware and plan for diagnostic angio Monday  Septic emboli  12/29/17 CTA  abd/pelvis - abdominal aorta occlusion inferior to the inferior mesenteric artery, as well as the splenic artery, and the branch of the superior mesenteric artery, concerning for thrombus.  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 Currently, coumadin discontinued due to ICH. No need to reverse INR. Will let it trending down. Once INR < 1.5, will do cerebral angio  Infective Endocarditis of Mitral Valve - Strep PNA Endocarditis  1/2 bottle showing strep PNA and repeat culture neg early this month in [facility]  Treated with continuous penicillin at 24 million units daily from 12/30/17 in [facility]  Plan for 4 weeks treatment at that time.  Blood culture NGTD  Currently on IV Vanc and Zosyn   Other Stroke Risk Factors  Substance abuse   ? 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 After hours, contact General Neurology

## 2018-01-16 NOTE — Plan of Care (Signed)
  Problem: Pain Managment: Goal: General experience of comfort will improve Outcome: Progressing   Problem: Nutrition: Goal: Risk of aspiration will decrease Outcome: Progressing   Problem: Education: Goal: Knowledge of disease or condition will improve Outcome: Not Progressing Goal: Knowledge of secondary prevention will improve Outcome: Not Progressing   Problem: Nutrition: Goal: Dietary intake will improve Outcome: Completed/Met

## 2018-01-16 NOTE — Progress Notes (Signed)
[place] TEAM 1 - Stepdown/ICU TEAM  Marcus [name]  ZOX:[id number] DOB: Jul 14, 1976 DOA: 01/14/2018 PCP: [facility], [facility]    Brief Narrative:  41yo homeless man w/ a hx of polysubstance abuse, Hep B and Hep C, and depression/SI who was diagnosed with endocarditis in April 2019 and treated originally at [facility]. The patient had a vegetation on theposterior leaflet of his mitral valve, and was found to have a subtotal occlusion of his aorta at the level of the IMA, as well as splenic and renal infarcts.  He had been on anticoagualtion with coumadin. He presented to the [facility] ED w/ c/o back pain, and was found to be encephalopathic.   Significant Events: 5/16 admit  5/16 TTE - EF 55-60% - no WMA - 1.4cm mobile veg posterior leaflet mitral valve w/ severe regurg   Subjective: Resting comfortably in bed.  Denies HA, sob, n/v, or abdom pain.  C/o not getting enough to eat w/ his meals.    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 - septic emboli to abdom  Blood cx negative thus far this admit - cont broad abx coverage for now - plan as per [facility] was to complete 4 weeks of abx and repeat TTE (was being dosed w/ PCN units/day > levaquin 750/day w/ last day of tx to be 01/26/18)  B MCA subacute infarcts as well as right frontal high convexity small hemorrhage due to septic emboli/mycotic aneurysm Care per Neurology    MRSA screen +  DVT prophylaxis: SCDs Code Status: FULL CODE Family Communication: no family present at time of exam  Disposition Plan: SDU  Consultants:  Neuro PCCM  Antimicrobials:  Zosyn 5/16 > 5/18 Vanc 5/16 >  Objective: Blood pressure 96/64, pulse 84, temperature 98.3 F (36.8 C), temperature source Oral, resp. rate (!) 30, height  (1.803 m), weight 58.4 kg (128 lb 12 oz), SpO2 100 %.  Intake/Output Summary (Last 24 hours) at 01/16/2018 1130 Last data filed at 01/16/2018 0500 Gross per  24 hour  Intake 1375 ml  Output 1250 ml  Net 125 ml   Filed Weights   01/14/18 2100 01/15/18 0457 01/16/18 0848  Weight: 59.6 kg (131 lb 6.3 oz) 59.6 kg (131 lb 6.3 oz) 58.4 kg (128 lb 12 oz)    Examination: General: No acute respiratory distress Lungs: Clear to auscultation bilaterally without wheezes or crackles Cardiovascular: 3/6 holosystolic M  Abdomen: Nontender, thin, soft, bowel sounds positive, no rebound, no ascites, no appreciable mass Extremities: No significant cyanosis, clubbing, or edema bilateral lower extremities  CBC: Recent Labs  Lab 01/14/18 0744 01/14/18 1105 01/15/18 0653 01/16/18 0512  WBC 11.9*  --  11.5* 9.4  HGB 8.2*  --  8.2* 8.0*  HCT 27.3*  --  27.1* 26.6*  MCV 86.9  --  87.7 87.2  PLT 410* 431* 417* 432*   Basic Metabolic Panel: Recent Labs  Lab 01/14/18 0744 01/15/18 0653 01/16/18 0512  NA 134* 139 139  K 3.8 3.6 3.6  CL 100* 108 107  CO2 GLUCOSE 103* 99 102*  BUN 8 5* 5*  CREATININE 0.71 0.74 0.73  CALCIUM 8.4* 8.3* 7.9*  MG  --  1.8 2.0  PHOS  --  3.6 3.2   GFR: Estimated Creatinine Clearance: 101.4 mL/min (by C-G formula based on SCr of 0.73 mg/dL).  Liver Function Tests: Recent Labs  Lab 01/14/18 1006  AST  24  ALT 32  ALKPHOS 57  BILITOT 0.6  PROT 6.8  ALBUMIN 1.8*    Recent Labs  Lab 01/14/18 1006  AMMONIA 12    Coagulation Profile: Recent Labs  Lab 01/12/18 2259 01/14/18 0904 01/14/18 1105 01/15/18 0653  INR 1.57 1.76 1.87 1.98    HbA1C: Hgb A1c MFr Bld  Date/Time Value Ref Range Status  01/15/2018 06:53 AM 4.8 4.8 - 5.6 % Final    Comment:    (NOTE) Pre diabetes:          5.7%-6.4% Diabetes:              >6.4% Glycemic control for   <7.0% adults with diabetes   07/06/2016 06:41 AM 5.0 4.8 - 5.6 % Final    Comment:    (NOTE)         Pre-diabetes: 5.7 - 6.4         Diabetes: >6.4         Glycemic control for adults with diabetes: <7.0     CBG: Recent Labs  Lab  01/12/18 1937  GLUCAP 92    Recent Results (from the past 240 hour(s))  Blood culture (routine x 2)     Status: None (Preliminary result)   Collection Time: 01/14/18 10:29 AM  Result Value Ref Range Status   Specimen Description BLOOD RIGHT ANTECUBITAL  Final   Special Requests   Final    BOTTLES DRAWN AEROBIC AND ANAEROBIC Blood Culture adequate volume   Culture   Final    NO GROWTH < 24 HOURS Performed at [facility], [address], [place], [place] [address]    Report Status PENDING  Incomplete  Blood culture (routine x 2)     Status: None (Preliminary result)   Collection Time: 01/14/18 11:05 AM  Result Value Ref Range Status   Specimen Description BLOOD RIGHT ANTECUBITAL  Final   Special Requests   Final    BOTTLES DRAWN AEROBIC AND ANAEROBIC Blood Culture adequate volume   Culture   Final    NO GROWTH < 24 HOURS Performed at [facility], [address], [place], [place] [address]    Report Status PENDING  Incomplete  MRSA PCR Screening     Status: Abnormal   Collection Time: 01/14/18  3:46 PM  Result Value Ref Range Status   MRSA by PCR POSITIVE (A) NEGATIVE Final    Comment:        The GeneXpert MRSA Assay (FDA approved for NASAL specimens only), is one component of a comprehensive MRSA colonization surveillance program. It is not intended to diagnose MRSA infection nor to guide or monitor treatment for MRSA infections. RESULT CALLED TO, READ BACK BY AND VERIFIED WITH: R.PATERNOSTER,RN AT 1804 BY L.PITT 01/14/18      Scheduled Meds: . Chlorhexidine Gluconate Cloth  6 each Topical Q0600  . mupirocin ointment  1 application Nasal BID  . senna-docusate  1 tablet Oral BID     LOS: 2 days   Lonia Blood, MD [facility] Office  [phone] Pager - Text Page per Amion as per below:  On-Call/Text Page:      [email or web address]      password [email or web address]  If 7PM-7AM, please contact night-coverage [email or web address] Password [email or web address] 01/16/2018, 11:30  AM

## 2018-01-17 LAB — MAGNESIUM: Magnesium: 1.8 mg/dL (ref 1.7–2.4)

## 2018-01-17 LAB — COMPREHENSIVE METABOLIC PANEL
ALBUMIN: 1.8 g/dL — AB (ref 3.5–5.0)
ALK PHOS: 61 U/L (ref 38–126)
ALT: 34 U/L (ref 17–63)
AST: 33 U/L (ref 15–41)
Anion gap: 7 (ref 5–15)
BUN: 11 mg/dL (ref 6–20)
CALCIUM: 8.3 mg/dL — AB (ref 8.9–10.3)
CHLORIDE: 106 mmol/L (ref 101–111)
CO2: 27 mmol/L (ref 22–32)
CREATININE: 0.83 mg/dL (ref 0.61–1.24)
GFR calc non Af Amer: >60 mL/min (ref >60)
GLUCOSE: 103 mg/dL — AB (ref 65–99)
Potassium: 3.9 mmol/L (ref 3.5–5.1)
SODIUM: 140 mmol/L (ref 135–145)
Total Bilirubin: 0.5 mg/dL (ref 0.3–1.2)
Total Protein: 6.8 g/dL (ref 6.5–8.1)

## 2018-01-17 LAB — CBC
HCT: 28.7 % — ABNORMAL LOW (ref 39.0–52.0)
Hemoglobin: 8.6 g/dL — ABNORMAL LOW (ref 13.0–17.0)
MCH: 26.6 pg (ref 26.0–34.0)
MCHC: 30 g/dL (ref 30.0–36.0)
MCV: 88.9 fL (ref 78.0–100.0)
PLATELETS: 537 10*3/uL — AB (ref 150–400)
RBC: 3.23 MIL/uL — AB (ref 4.22–5.81)
RDW: 16 % — AB (ref 11.5–15.5)
WBC: 10.9 10*3/uL — ABNORMAL HIGH (ref 4.0–10.5)

## 2018-01-17 LAB — PROTIME-INR
INR: 1.18
Prothrombin Time: 14.9 seconds (ref 11.4–15.2)

## 2018-01-17 LAB — VITAMIN B12: VITAMIN B 12: 348 pg/mL (ref 180–914)

## 2018-01-17 LAB — FOLATE: Folate: 8.9 ng/mL (ref >5.9)

## 2018-01-17 LAB — PHOSPHORUS: Phosphorus: 2.9 mg/dL (ref 2.5–4.6)

## 2018-01-17 NOTE — Plan of Care (Signed)
  Problem: Clinical Measurements: Goal: Respiratory complications will improve Outcome: Progressing   Problem: Clinical Measurements: Goal: Will remain free from infection Outcome: Not Progressing Goal: Cardiovascular complication will be avoided Outcome: Not Progressing   Problem: Coping: Goal: Level of anxiety will decrease Outcome: Not Progressing   Problem: Education: Goal: Knowledge of disease or condition will improve Outcome: Not Progressing Goal: Knowledge of secondary prevention will improve Outcome: Not Progressing Goal: Knowledge of patient specific risk factors addressed and post discharge goals established will improve Outcome: Not Progressing

## 2018-01-17 NOTE — Progress Notes (Signed)
Marcus [name] is awake and alert with a flat affect.  While using the bedpan, he requested that he not be left on for a long time.  Waited in room for him to finish, and instructed him to roll to one side so I could remove the bedpan and clean him.  Marcus [name] rolled and then grabbed his bedpan and smeared stool all over the linens.  When asked why he did not wait, he became aggressive and told me that I did not give him enough instructions.  He refused a bath and scd's.   He demanded warm blankets and got angry when I told him we would have to go and get at the end of the hall.  Warm blankets were given.  Patient is continually pulling up his covers and exposing himself to this nurse each time she walks into the room.  Have explained to the patient that this is unacceptable.

## 2018-01-17 NOTE — Progress Notes (Signed)
[place] TEAM 1 - Stepdown/ICU TEAM  Jiyan Walkowski  WUJ:[id number] DOB: Aug 08, 1976 DOA: 01/14/2018 PCP: [facility]    Brief Narrative:  42yo homeless man w/ a hx of polysubstance abuse, Hep B and Hep C, and depression/SI who was diagnosed with endocarditis in April 2019 and treated originally at [facility]. 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  He had been on anticoagualtion with coumadin. He presented to the [facility] ED w/ c/o back pain, and was found to be encephalopathic.   Significant Events: 5/16 admit  5/16 TTE - EF 55-60% - no WMA - 1.4cm mobile veg posterior leaflet mitral valve w/ severe regurg   Subjective: Patient appears to be resting comfortably in bed.  He denies headache nausea vomiting shortness of breath or abdominal pain.  He reports he remains hungry but admits he has been getting extra portions with meals.  He has been somewhat agitated this morning and has not been following the treatment suggestions of his nurse.  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 - septic emboli to abdom  Blood cx negative thus far this admit - cont broad abx coverage for now - plan as per [facility] was to complete 4 weeks of abx and repeat TTE (was being dosed w/ PCN units/day > levaquin 750/day w/ last day of tx to be 01/26/18) - will ask ID to comment on tx options from this point forward on 5/20  B MCA subacute infarcts as well as right frontal high convexity small hemorrhage due to septic emboli/mycotic aneurysm Care per Neurology - for cerebral angiogram in AM    MRSA screen +  Polysubstance abuse  Homelessness CSW consult - care post-hospital will be quite difficult to coordinate   DVT prophylaxis: SCDs Code Status: FULL CODE Family Communication: no  family present at time of exam  Disposition Plan: SDU due to high risk for abrupt catastrophic decline due to rupture of cerebral mycotic aneurysm   Consultants:  Neuro PCCM  Antimicrobials:  Zosyn 5/16 > 5/18 Vanc 5/16 >  Objective: Blood pressure 101/62, pulse (!) 106, temperature 98.6 F (37 C), temperature source Oral, resp. rate 16, height  (1.803 m), weight 62.8 kg (138 lb 7.2 oz), SpO2 99 %.  Intake/Output Summary (Last 24 hours) at 01/17/2018 1118 Last data filed at 01/17/2018 1052 Gross per 24 hour  Intake 2308.58 ml  Output 2650 ml  Net -341.42 ml   Filed Weights   01/15/18 0457 01/16/18 0848 01/16/18 2053  Weight: 59.6 kg (131 lb 6.3 oz) 58.4 kg (128 lb 12 oz) 62.8 kg (138 lb 7.2 oz)    Examination: General: No acute respiratory distress - alert and conversant Lungs: Clear to auscultation bilaterally  Cardiovascular: 3/6 holosystolic M - no rub  Abdomen: Nontender, thin, soft, bowel sounds positive Extremities: No edema B LE   CBC: Recent Labs  Lab 01/15/18 0653 01/16/18 0512 01/17/18 0355  WBC 11.5* 9.4 10.9*  HGB 8.2* 8.0* 8.6*  HCT 27.1* 26.6* 28.7*  MCV 87.7 87.2 88.9  PLT 417* 432* 537*   Basic Metabolic Panel: Recent Labs  Lab 01/15/18 0653 01/16/18 0512 01/17/18 0355  NA 139 139 140  K 3.6 3.6 3.9  CL 108 107 106  CO2 25 23  27  GLUCOSE 99 102* 103*  BUN 5* 5* 11  CREATININE 0.74 0.73 0.83  CALCIUM 8.3* 7.9* 8.3*  MG 1.8 2.0 1.8  PHOS 3.6 3.2 2.9   GFR: Estimated Creatinine Clearance: 105.1 mL/min (by C-G formula based on SCr of 0.83 mg/dL).  Liver Function Tests: Recent Labs  Lab 01/14/18 1006 01/17/18 0355  AST 24 33  ALT 32 34  ALKPHOS 57 61  BILITOT 0.6 0.5  PROT 6.8 6.8  ALBUMIN 1.8* 1.8*    Recent Labs  Lab 01/14/18 1006  AMMONIA 12    Coagulation Profile: Recent Labs  Lab 01/12/18 2259 01/14/18 0904 01/14/18 1105 01/15/18 0653  INR 1.57 1.76 1.87 1.98    HbA1C: Hgb A1c MFr Bld  Date/Time Value  Ref Range Status  01/15/2018 06:53 AM 4.8 4.8 - 5.6 % Final    Comment:    (NOTE) Pre diabetes:          5.7%-6.4% Diabetes:              >6.4% Glycemic control for   <7.0% adults with diabetes   07/06/2016 06:41 AM 5.0 4.8 - 5.6 % Final    Comment:    (NOTE)         Pre-diabetes: 5.7 - 6.4         Diabetes: >6.4         Glycemic control for adults with diabetes: <7.0     CBG: Recent Labs  Lab 01/12/18 1937  GLUCAP 92    Recent Results (from the past 240 hour(s))  Blood culture (routine x 2)     Status: None (Preliminary result)   Collection Time: 01/14/18 10:29 AM  Result Value Ref Range Status   Specimen Description BLOOD RIGHT ANTECUBITAL  Final   Special Requests   Final    BOTTLES DRAWN AEROBIC AND ANAEROBIC Blood Culture adequate volume   Culture   Final    NO GROWTH 2 DAYS Performed at [facility], [address] [place], [place] [address]    Report Status PENDING  Incomplete  Blood culture (routine x 2)     Status: None (Preliminary result)   Collection Time: 01/14/18 11:05 AM  Result Value Ref Range Status   Specimen Description BLOOD RIGHT ANTECUBITAL  Final   Special Requests   Final    BOTTLES DRAWN AEROBIC AND ANAEROBIC Blood Culture adequate volume   Culture   Final    NO GROWTH 2 DAYS Performed at [facility], [address], [place], [place] [address]    Report Status PENDING  Incomplete  MRSA PCR Screening     Status: Abnormal   Collection Time: 01/14/18  3:46 PM  Result Value Ref Range Status   MRSA by PCR POSITIVE (A) NEGATIVE Final    Comment:        The GeneXpert MRSA Assay (FDA approved for NASAL specimens only), is one component of a comprehensive MRSA colonization surveillance program. It is not intended to diagnose MRSA infection nor to guide or monitor treatment for MRSA infections. RESULT CALLED TO, READ BACK BY AND VERIFIED WITH: R.PATERNOSTER,RN AT 1804 BY L.PITT 01/14/18      Scheduled Meds: .  Chlorhexidine Gluconate Cloth  6 each Topical Q0600  . famotidine  20 mg Oral BID  . feeding supplement (ENSURE ENLIVE)  237 mL Oral TID BM  . mupirocin ointment  1 application Nasal BID  . senna-docusate  1 tablet Oral BID  LOS: 3 days   Lonia Blood, MD [facility] Office  [phone] Pager - Text Page per Amion as per below:  On-Call/Text Page:      [email or web address]      password [email or web address]  If 7PM-7AM, please contact night-coverage [email or web address] Password [email or web address] 01/17/2018, 11:18 AM

## 2018-01-17 NOTE — Plan of Care (Signed)
Care plans reviewed and patient is not receptive to education.

## 2018-01-17 NOTE — Plan of Care (Signed)
Care plans reviewed and patient is progressing.  

## 2018-01-17 NOTE — Progress Notes (Signed)
Mr. Minihan requested his clothing bag so he could put on his underpants.  States he has not been exposing himself and this would help him to keep covered.  Patient was given his clothes bags, and he put on his under pants.  Now each time this nurse enters the room, the patient placed his hands in his underpants and fondles himself in the presence of the nurse and Gabby, NT.

## 2018-01-17 NOTE — Progress Notes (Signed)
STROKE PROGRESS NOTE   SUBJECTIVE No family at bedside. Pt lying in bed comfortably. No neuro changes overnight. Pending DSA in am. Pt agrees and no questions asked.   OBJECTIVE Most recent Vital Signs: Vitals:   01/16/18 2346 01/17/18 0353 01/17/18 0700 01/17/18 0749  BP: 102/69 108/67  101/62  Pulse: (!) 101 96  (!) 106  Resp: (!) Temp: 99 F (37.2 C) 98.4 F (36.9 C) 98 F (36.7 C) 98.6 F (37 C)  TempSrc: Oral Oral  Oral  SpO2: 100% 100%  99%  Weight:      Height:       CBG (last 3)  No results for input(s): GLUCAP in the last 72 hours.  Physical Exam  HEENT-  Normocephalic, no lesions, without obvious abnormality.  Normal external eye and conjunctiva.   Cardiovascular- S1-S2 audible, pulses palpable throughout, III/VI systolic murmur   Lungs-no rhonchi or wheezing noted, no excessive working breathing.  Saturations within normal limits Abdomen- All 4 quadrants palpated and nontender Musculoskeletal-no joint tenderness, deformity or swelling   Neuro:  Mental Status: Alert, oriented, thought content appropriate.  Speech fluent without evidence of aphasia.  Able to follow 3 step commands without difficulty. Cranial Nerves: II:  Visual fields grossly normal,  III,IV, VI: ptosis not present, extra-ocular motions intact bilaterally pupils equal, round, reactive to light and accommodation V,VII: smile symmetric, facial light touch sensation normal bilaterally VIII: hearing intact to voice IX,X: uvula rises symmetrically XI: bilateral shoulder shrug XII: midline tongue extension Motor: PT with generalized weakness in all extrmeities Right : Upper extremity  4/5    Left:     Upper extremity   4/5  Lower extremity   4/5     Lower extremity   4/5 Tone and bulk:normal tone throughout; no atrophy noted Sensory: Sensation intact to temperature Deep Tendon Reflexes: 2+ and symmetric throughout Plantars: Right:  downgoing   Left: downgoing Cerebellar: normal finger-to-nose, normal rapid alternating movements Gait: Not tested  IV Fluid Intake:   . vancomycin Stopped (01/17/18 0700)    MEDICATIONS  . Chlorhexidine Gluconate Cloth  6 each Topical Q0600  . famotidine  20 mg Oral BID  . feeding supplement (ENSURE ENLIVE)  237 mL Oral TID BM  . mupirocin ointment  1 application Nasal BID  . senna-docusate  1 tablet Oral BID   PRN:  acetaminophen, iopamidol  Diet:   Diet Order           Diet NPO time specified  Diet effective midnight        Diet regular Room service appropriate? Yes; Fluid consistency: Thin  Diet effective now          CLINICALLY SIGNIFICANT STUDIES Basic Metabolic Panel:  Recent Labs  Lab 01/16/18 0512 01/17/18 0355  NA 139 140  K 3.6 3.9  CL 107 106  CO2 23 27  GLUCOSE 102* 103*  BUN 5* 11  CREATININE 0.73 0.83  CALCIUM 7.9* 8.3*  MG 2.0 1.8  PHOS 3.2 2.9   Liver Function Tests:  Recent Labs  Lab 01/14/18 1006 01/17/18 0355  AST 24 33  ALT 32 34  ALKPHOS 57 61  BILITOT  0.6 0.5  PROT 6.8 6.8  ALBUMIN 1.8* 1.8*   CBC:  Recent Labs  Lab 01/16/18 0512 01/17/18 0355  WBC 9.4 10.9*  HGB 8.0* 8.6*  HCT 26.6* 28.7*  MCV 87.2 88.9  PLT 432* 537*   Coagulation:  Recent Labs  Lab 01/12/18 2259 01/14/18 0904 01/14/18 1105 01/15/18 0653  LABPROT 18.7* 20.3* 21.4* 22.3*  INR 1.57 1.76 1.87 1.98   Cardiac Enzymes: No results for input(s): CKTOTAL, CKMB, CKMBINDEX, TROPONINI in the last 168 hours. Urinalysis:  Recent Labs  Lab 01/12/18 2050  COLORURINE YELLOW  LABSPEC 1.014  PHURINE 5.0  GLUCOSEU NEGATIVE  HGBUR NEGATIVE  BILIRUBINUR NEGATIVE  KETONESUR NEGATIVE  PROTEINUR NEGATIVE  NITRITE NEGATIVE  LEUKOCYTESUR NEGATIVE   Lipid Panel    Component Value Date/Time   CHOL 100 01/15/2018 0653   TRIG 43 01/15/2018 0653   HDL 27 (L) 01/15/2018 0653   CHOLHDL 3.7 01/15/2018 0653   VLDL 9 01/15/2018 0653   LDLCALC 64 01/15/2018 0653     HgbA1C  Lab Results  Component Value Date   HGBA1C 4.8 01/15/2018    Urine Drug Screen:      Component Value Date/Time   LABOPIA POSITIVE (A) 01/14/2018 2248   COCAINSCRNUR NONE DETECTED 01/14/2018 2248   LABBENZ NONE DETECTED 01/14/2018 2248   AMPHETMU NONE DETECTED 01/14/2018 2248   THCU NONE DETECTED 01/14/2018 2248   LABBARB NONE DETECTED 01/14/2018 2248    Alcohol Level:  Recent Labs  Lab 01/14/18 1006  ETH <10    Dg Chest 2 View 01/14/2018 IMPRESSION:  No active cardiopulmonary disease.   Ct Head Wo Contrast 01/14/2018   ADDENDUM REPORT: 01/14/2018 10:35 He advises a history of Endocarditis in this patient. THEREFORE, the appearance is highly likely to reflect SEPTIC EMBOLI, with acute embolic related hemorrhage in the right superior frontal lobe. IMPRESSION:  1. 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  2. Possible trace focus of acute hemorrhage in the right superior frontal gyrus on series 3, image 25. No other acute intracranial hemorrhage identified.  3. Right MCA territory cytotoxic edema, ASPECTS 7 (although note suspicion of trace acute hemorrhage in #2).  4. Chronic appearing left MCA white matter disease.   Mr Maxine Glenn Head Wo Contrast 01/14/2018 IMPRESSION:  1. 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  2 . Otherwise negative MRA head.  Mr Brain Wo Contrast: 01/14/2018 IMPRESSION 1. Multifocal subacute bilateral MCA/watershed territory infarcts.  2. Peripheral susceptibility artifact, septic emboli suspected. There may be superimposed shear injury or underlying chronic hypertension.  3. Mild parenchymal brain volume loss for age.  4. Old small RIGHT cerebellar infarcts.   Echo 01/14/18 Impressions: - Findings consisent with endocarditis of the mitral valve with associated severe mitral regurgitation. Consider TEE to better evaluate if  clinically indicated.   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  Noncontrast head CT shows a hyperdense M1 with a small area of poor density may be reflecting a bleed versus a mycotic aneurysm as well as changes of stroke in the right MCA territory.  Stroke - bilateral MCA subacute infarcts as well as right frontal high convexity small hemorrhage - likely related  to septic emboli as well as mycotic aneurysm.  CT head: Evidence of large vessel occlusion at the right MCA proximal M2 - likely septic emboli. 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  MRI Brain: Multifocal subacute bilateral MCA/watershed territory infarcts and right frontal high convexity small hemorrhage  2D Echo : mobile vegetation at posterior leaflet, associated severe mitral regurgitation.   LDL: 64  HgbA1c: 4.8  UDS: positive for opiates  VTE Prophylaxis - SCD  No anticoagulation at this time in the setting of septic emboli with hemorrhagic conversion and large mycotic aneurysm. Coumadin and ASA discontinued.    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 Corliss Skains aware and plan for diagnostic angio Monday  Septic emboli  12/29/17 CTA abd/pelvis - abdominal aorta occlusion inferior to the inferior mesenteric artery, as well as the splenic  artery, and the branch of the superior mesenteric artery, concerning for thrombus.  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 Currently, coumadin discontinued due to ICH. No need to reverse INR. Will let it trending down. 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  Blood culture NGTD  Currently on IV Vanc and Zosyn   Other Stroke Risk Factors  Substance abuse   ? 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 After hours, contact General Neurology

## 2018-01-18 ENCOUNTER — Inpatient Hospital Stay (HOSPITAL_COMMUNITY): Payer: Medicaid Other

## 2018-01-18 HISTORY — PX: IR ANGIO VERTEBRAL SEL VERTEBRAL UNI R MOD SED: IMG5368

## 2018-01-18 HISTORY — PX: IR ANGIO VERTEBRAL SEL SUBCLAVIAN INNOMINATE UNI L MOD SED: IMG5364

## 2018-01-18 HISTORY — PX: IR ANGIO INTRA EXTRACRAN SEL COM CAROTID INNOMINATE BILAT MOD SED: IMG5360

## 2018-01-18 LAB — COMPREHENSIVE METABOLIC PANEL
ALBUMIN: 1.9 g/dL — AB (ref 3.5–5.0)
ALT: 42 U/L (ref 17–63)
ANION GAP: 7 (ref 5–15)
AST: 43 U/L — ABNORMAL HIGH (ref 15–41)
Alkaline Phosphatase: 59 U/L (ref 38–126)
BILIRUBIN TOTAL: 0.4 mg/dL (ref 0.3–1.2)
BUN: 15 mg/dL (ref 6–20)
CALCIUM: 8.6 mg/dL — AB (ref 8.9–10.3)
CO2: 28 mmol/L (ref 22–32)
Chloride: 104 mmol/L (ref 101–111)
Creatinine, Ser: 0.72 mg/dL (ref 0.61–1.24)
GLUCOSE: 94 mg/dL (ref 65–99)
POTASSIUM: 4.2 mmol/L (ref 3.5–5.1)
Sodium: 139 mmol/L (ref 135–145)
TOTAL PROTEIN: 7.1 g/dL (ref 6.5–8.1)

## 2018-01-18 LAB — CBC
HEMATOCRIT: 27.7 % — AB (ref 39.0–52.0)
Hemoglobin: 8.1 g/dL — ABNORMAL LOW (ref 13.0–17.0)
MCH: 25.9 pg — AB (ref 26.0–34.0)
MCHC: 29.2 g/dL — AB (ref 30.0–36.0)
MCV: 88.5 fL (ref 78.0–100.0)
Platelets: 524 10*3/uL — ABNORMAL HIGH (ref 150–400)
RBC: 3.13 MIL/uL — ABNORMAL LOW (ref 4.22–5.81)
RDW: 16.4 % — AB (ref 11.5–15.5)
WBC: 10.4 10*3/uL (ref 4.0–10.5)

## 2018-01-18 LAB — PROTIME-INR
INR: 1.18
Prothrombin Time: 14.9 seconds (ref 11.4–15.2)

## 2018-01-18 LAB — VANCOMYCIN, TROUGH: VANCOMYCIN TR: 21 ug/mL — AB (ref 15–20)

## 2018-01-18 MED ORDER — SODIUM CHLORIDE 0.9 % IV SOLN
INTRAVENOUS | Status: AC | PRN
  Administered 2018-01-18: 500 mL via INTRAVENOUS

## 2018-01-18 MED ORDER — FENTANYL CITRATE (PF) 100 MCG/2ML IJ SOLN
INTRAMUSCULAR | Status: AC
  Filled 2018-01-18: qty 2

## 2018-01-18 MED ORDER — LIDOCAINE HCL 1 % IJ SOLN
INTRAMUSCULAR | Status: AC
  Filled 2018-01-18: qty 20

## 2018-01-18 MED ORDER — ENSURE ENLIVE PO LIQD
237.0000 mL | Freq: Three times a day (TID) | ORAL | Status: DC
  Administered 2018-01-18 – 2018-02-09 (×78): 237 mL via ORAL

## 2018-01-18 MED ORDER — HEPARIN SODIUM (PORCINE) 1000 UNIT/ML IJ SOLN
INTRAMUSCULAR | Status: AC
  Filled 2018-01-18: qty 1

## 2018-01-18 MED ORDER — FENTANYL CITRATE (PF) 100 MCG/2ML IJ SOLN
INTRAMUSCULAR | Status: AC | PRN
  Administered 2018-01-18 (×3): 12.5 ug via INTRAVENOUS

## 2018-01-18 MED ORDER — IOHEXOL 300 MG/ML  SOLN: 82.0000 mL | Freq: Once | INTRAMUSCULAR | Status: DC | PRN

## 2018-01-18 MED ORDER — LIDOCAINE HCL (PF) 1 % IJ SOLN
INTRAMUSCULAR | Status: AC | PRN
  Administered 2018-01-18: 40 mL

## 2018-01-18 MED ORDER — MIDAZOLAM HCL 2 MG/2ML IJ SOLN
INTRAMUSCULAR | Status: AC | PRN
  Administered 2018-01-18: 0.5 mg via INTRAVENOUS

## 2018-01-18 MED ORDER — VANCOMYCIN HCL IN DEXTROSE 750-5 MG/150ML-% IV SOLN
750.0000 mg | Freq: Three times a day (TID) | INTRAVENOUS | Status: DC
  Administered 2018-01-18 – 2018-01-19 (×2): 750 mg via INTRAVENOUS
  Filled 2018-01-18 (×3): qty 150

## 2018-01-18 MED ORDER — MIDAZOLAM HCL 2 MG/2ML IJ SOLN
INTRAMUSCULAR | Status: AC
  Filled 2018-01-18: qty 2

## 2018-01-18 MED ORDER — SODIUM CHLORIDE 0.9 % IV SOLN: INTRAVENOUS | Status: AC

## 2018-01-18 NOTE — Sedation Documentation (Signed)
Patient is resting comfortably.MD giving instructions to be very still

## 2018-01-18 NOTE — Sedation Documentation (Signed)
Patient is resting comfortably. 

## 2018-01-18 NOTE — Progress Notes (Signed)
Pharmacy Antibiotic Note  Marcus [name] is a 42 y.o. male admitted on 01/14/2018 with sepsis.  Pharmacy has been consulted for vancomycin and zosyn dosing.  Bcx from 4/30 at [facility] grew strep pneumonia (I cefuroxime, R Bactrim). 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 CT head today found to have large vessel occlusion of R MCA proximal M2 with possible trace focus of acute hemorrhage in R superior frontal gyrus. WBC WNL, afebrile, renal function stable ~0.7 - 0.85. Antibiotic therapy was changed to vancomycin and zosyn.   Vancomycin trough 5/17 = 18  Vancomycin trough 5/20 = 21  Plan: Reduce vancomycin to 750 mg IV every 8 hours.  Goal trough 15-20 mcg/mL. Monitor renal function, cx results, clinical pic, VT prn  Height:  (180.3 cm) Weight: 128 lb 8.5 oz (58.3 kg) IBW/kg (Calculated) : 75.3  Temp (24hrs), Avg:98.3 F (36.8 C), Min:97.9 F (36.6 C), Max:98.6 F (37 C)  Recent Labs  Lab 01/14/18 0744 01/14/18 1031 01/15/18 0653 01/15/18 1941 01/16/18 0512 01/17/18 0355 01/18/18 0342 01/18/18 1220  WBC 11.9*  --  11.5*  --  9.4 10.9* 10.4  --   CREATININE 0.71  --  0.74  --  0.73 0.83 0.72  --   LATICACIDVEN  --  0.83  --   --   --   --   --   --   VANCOTROUGH  --   --   --  18  --   --   --  21*    Estimated Creatinine Clearance: 101.2 mL/min (by C-G formula based on SCr of 0.72 mg/dL).    Allergies  Allergen Reactions  . Bee Venom Anaphylaxis  . Food Hives and Other (See Comments)    grapefruit  . Penicillins Other (See Comments)    Possible reaction - pt unsure Has patient had a PCN reaction causing immediate rash, facial/tongue/throat swelling, SOB or lightheadedness with hypotension: unk Has patient had a PCN reaction causing severe rash involving mucus membranes or skin necrosis: unk Has patient had a PCN reaction that required hospitalization: unk Has patient had a PCN reaction  occurring within the last 10 years: No If all of the above answers are "NO", then may proceed with Cephalosporin use.   . Tylenol [Acetaminophen] Other (See Comments)    Pt states that he is not supposed to take this medication because of his Hep C.     Antimicrobials this admission: Vancomycin 5/16 >>  Zosyn 5/16 >> 5/18  Dose adjustments this admission: N/A  Microbiology results: 5/16 BCx: NGTD  Ruben Im, PharmD Clinical Pharmacist 01/18/2018 2:27 PM

## 2018-01-18 NOTE — Sedation Documentation (Signed)
IR team still holding pressure to R arm

## 2018-01-18 NOTE — Sedation Documentation (Signed)
Patient tolerating procedure well.

## 2018-01-18 NOTE — Sedation Documentation (Signed)
MD trying to gain access, difficult due to pts history, MD speaking with pt about reasons it is dificult

## 2018-01-18 NOTE — Plan of Care (Signed)
  Problem: Clinical Measurements: Goal: Respiratory complications will improve Outcome: Progressing   Problem: Nutrition: Goal: Risk of aspiration will decrease Outcome: Progressing   Problem: Clinical Measurements: Goal: Will remain free from infection Outcome: Not Progressing Goal: Cardiovascular complication will be avoided Outcome: Not Progressing   Problem: Coping: Goal: Level of anxiety will decrease Outcome: Not Progressing   Problem: Education: Goal: Knowledge of disease or condition will improve 01/18/2018 0519 by Jill Side, RN Outcome: Not Progressing 01/17/2018 2352 by Jill Side, RN Outcome: Not Progressing Goal: Knowledge of secondary prevention will improve 01/18/2018 0519 by Jill Side, RN Outcome: Not Progressing 01/17/2018 2352 by Jill Side, RN Outcome: Not Progressing Goal: Knowledge of patient specific risk factors addressed and post discharge goals established will improve 01/18/2018 0519 by Jill Side, RN Outcome: Not Progressing 01/17/2018 2352 by Jill Side, RN Outcome: Not Progressing

## 2018-01-18 NOTE — Sedation Documentation (Signed)
Pressure hold complete- Restrictions start now- dsg intact

## 2018-01-18 NOTE — Progress Notes (Signed)
STROKE PROGRESS NOTE   SUBJECTIVE No family at bedside. Pt back from angio and no aneurysm found. Pt asking for going out for fresh air.   OBJECTIVE Most recent Vital Signs: Vitals:   01/18/18 1215 01/18/18 1230 01/18/18 1245 01/18/18 1300  BP: 99/60 115/63  100/66  Pulse:      Resp: Temp:      TempSrc:      SpO2: 100% 100% 100% 99%  Weight:      Height:       CBG (last 3)  No results for input(s): GLUCAP in the last 72 hours.  Physical Exam  HEENT-  Normocephalic, no lesions, without obvious abnormality.  Normal external eye and conjunctiva.   Cardiovascular- S1-S2 audible, pulses palpable throughout, III/VI systolic murmur   Lungs-no rhonchi or wheezing noted, no excessive working breathing.  Saturations within normal limits Abdomen- All 4 quadrants palpated and nontender Musculoskeletal-no joint tenderness, deformity or swelling  Neuro:  Mental Status: Alert, oriented, thought content appropriate.  Speech fluent without evidence of aphasia.  Able to follow 3 step commands without difficulty. Cranial Nerves: II:  Visual fields grossly normal,  III,IV, VI: ptosis not present, extra-ocular motions intact bilaterally pupils equal, round, reactive to light and accommodation V,VII: smile symmetric, facial light touch sensation normal bilaterally VIII: hearing intact to voice IX,X: uvula rises symmetrically XI: bilateral shoulder shrug XII: midline tongue extension Motor: PT with generalized weakness in all extrmeities Right : Upper extremity  4/5    Left:     Upper extremity   4/5  Lower extremity   4/5     Lower extremity   4/5 Tone and bulk:normal tone throughout; no atrophy noted Sensory: Sensation intact to light touch Deep Tendon Reflexes: 2+ and symmetric throughout Plantars: Right: downgoing   Left: downgoing Cerebellar: normal finger-to-nose, normal rapid alternating movements Gait: Not  tested  IV Fluid Intake:   . sodium chloride 75 mL/hr at 01/18/18 1212  . vancomycin 1,000 mg (01/18/18 0510)    MEDICATIONS  . Chlorhexidine Gluconate Cloth  6 each Topical Q0600  . famotidine  20 mg Oral BID  . feeding supplement (ENSURE ENLIVE)  237 mL Oral TID BM  . fentaNYL      . lidocaine      . lidocaine      . midazolam      . mupirocin ointment  1 application Nasal BID  . senna-docusate  1 tablet Oral BID   PRN:  acetaminophen, iohexol, iopamidol  Diet:   Diet Order           Diet regular Room service appropriate? Yes; Fluid consistency: Thin  Diet effective now          CLINICALLY SIGNIFICANT STUDIES Basic Metabolic Panel:  Recent Labs  Lab 01/16/18 0512 01/17/18 0355 01/18/18 0342  NA 139 140 139  K 3.6 3.9 4.2  CL 107 106 104  CO2 GLUCOSE 102* 103* 94  BUN 5* 11 15  CREATININE 0.73 0.83 0.72  CALCIUM 7.9* 8.3* 8.6*  MG 2.0 1.8  --   PHOS 3.2 2.9  --    Liver Function Tests:  Recent Labs  Lab 01/17/18 0355 01/18/18 0342  AST 33 43*  ALT 34 42  ALKPHOS 61 59  BILITOT 0.5 0.4  PROT 6.8 7.1  ALBUMIN 1.8* 1.9*   CBC:  Recent Labs  Lab 01/17/18 0355 01/18/18 0342  WBC 10.9* 10.4  HGB 8.6* 8.1*  HCT 28.7* 27.7*  MCV 88.9 88.5  PLT 537* 524*   Coagulation:  Recent Labs  Lab 01/14/18 1105 01/15/18 0653 01/17/18 1200 01/18/18 0342  LABPROT 21.4* 22.3* 14.9 14.9  INR 1.87 1.98 1.18 1.18   Cardiac Enzymes: No results for input(s): CKTOTAL, CKMB, CKMBINDEX, TROPONINI in the last 168 hours. Urinalysis:  Recent Labs  Lab 01/12/18 2050  COLORURINE YELLOW  LABSPEC 1.014  PHURINE 5.0  GLUCOSEU NEGATIVE  HGBUR NEGATIVE  BILIRUBINUR NEGATIVE  KETONESUR NEGATIVE  PROTEINUR NEGATIVE  NITRITE NEGATIVE  LEUKOCYTESUR NEGATIVE   Lipid Panel    Component Value Date/Time   CHOL 100 01/15/2018 0653   TRIG 43 01/15/2018 0653   HDL 27 (L) 01/15/2018 0653   CHOLHDL 3.7 01/15/2018 0653   VLDL 9 01/15/2018 0653   LDLCALC 64  01/15/2018 0653   HgbA1C  Lab Results  Component Value Date   HGBA1C 4.8 01/15/2018    Urine Drug Screen:      Component Value Date/Time   LABOPIA POSITIVE (A) 01/14/2018 2248   COCAINSCRNUR NONE DETECTED 01/14/2018 2248   LABBENZ NONE DETECTED 01/14/2018 2248   AMPHETMU NONE DETECTED 01/14/2018 2248   THCU NONE DETECTED 01/14/2018 2248   LABBARB NONE DETECTED 01/14/2018 2248    Alcohol Level:  Recent Labs  Lab 01/14/18 1006  ETH <10    Dg Chest 2 View 01/14/2018 IMPRESSION:  No active cardiopulmonary disease.   Ct Head Wo Contrast 01/14/2018   ADDENDUM REPORT: 01/14/2018 10:35 He advises a history of Endocarditis in this patient. THEREFORE, the appearance is highly likely to reflect SEPTIC EMBOLI, with acute embolic related hemorrhage in the right superior frontal lobe. IMPRESSION:  1. 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  2. Possible trace focus of acute hemorrhage in the right superior frontal gyrus on series 3, image 25. No other acute intracranial hemorrhage identified.  3. Right MCA territory cytotoxic edema, ASPECTS 7 (although note suspicion of trace acute hemorrhage in #2).  4. Chronic appearing left MCA white matter disease.   Mr Maxine Glenn Head Wo Contrast 01/14/2018 IMPRESSION:  1. 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  2 . Otherwise negative MRA head.  Mr Brain Wo Contrast: 01/14/2018 IMPRESSION 1. Multifocal subacute bilateral MCA/watershed territory infarcts.  2. Peripheral susceptibility artifact, septic emboli suspected. There may be superimposed shear injury or underlying chronic hypertension.  3. Mild parenchymal brain volume loss for age.  4. Old small RIGHT cerebellar infarcts.   Echo 01/14/18 Impressions: - Findings consisent with endocarditis of the mitral valve with associated severe mitral regurgitation. Consider TEE to better  evaluate if clinically indicated.\  DSA 01/18/18 1.No angio evidence of intracranial aneurysm. 2.No AVM,DAVF or AV shunting or vasospasm seen. Venous outflow WNLs.   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  Noncontrast head CT shows a hyperdense M1 with a small area of  poor density may be reflecting a bleed versus a mycotic aneurysm as well as changes of stroke in the right MCA territory.  Stroke - bilateral MCA subacute infarcts as well as right frontal high convexity small hemorrhage - likely related to septic emboli as well as micro mycotic aneurysm.  CT head: Trace focus of acute hemorrhage in the right superior frontal gyrus. Right sylvian fissure hyperdensity etiology unclear  MRA head: concerning for right MCA developing mycotic aneurysm.  DSA 01/18/18 no mycotic aneurysm seen  MRI Brain: Multifocal subacute bilateral MCA/watershed territory infarcts and right frontal high convexity small hemorrhage  2D Echo : mobile vegetation at posterior leaflet, associated severe mitral regurgitation.   LDL: 64  HgbA1c: 4.8  UDS: positive for opiates  VTE Prophylaxis - SCD  No anticoagulation at this time in the setting of septic emboli with hemorrhagic conversion and large mycotic aneurysm. No antiplatelet or anticoagulation indicated at this time.    Therapy recommendations:  pending   Disposition:  pending   Negative for large mycotic aneurysm   MRA ? 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 Plan for 4 weeks treatment at that time.  Blood culture NGTD  Currently on IV Vanco  Other Stroke Risk Factors  Substance abuse   ? 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 Please call with questions. Pt will follow up with stroke clinic NP at [facility] in about 4 weeks. Thanks for the consult.  Marvel Plan, MD PhD Stroke Neurology 01/18/2018 1:41 PM           To contact Stroke Continuity provider, please refer to [email or web address]. After hours, contact General Neurology

## 2018-01-18 NOTE — Sedation Documentation (Signed)
IR removed brachial sheath- holding pressure

## 2018-01-18 NOTE — Procedures (Signed)
S/P 4 vessel cerebral arteriogram RT brachial artery approach. Findings. 1.No angio evidence of intracranial aneurysm. 2.No AVM,DAVF or AV shunting or vasospasm  Seen. Venous outflow WNLs.

## 2018-01-18 NOTE — Progress Notes (Addendum)
[place] TEAM 1 - Stepdown/ICU TEAM  Demetres Prochnow  ZOX:[id number] DOB: May 15, 1976 DOA: 01/14/2018 PCP: Inc, [facility]    Brief Narrative:  42yo homeless man w/ a hx of polysubstance abuse, Hep B and Hep C, and depression/SI who was diagnosed with endocarditis in April 2019 and treated originally at [facility]. 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  He had been on anticoagualtion with coumadin. He presented to the [facility] ED w/ c/o back pain, and was found to be encephalopathic.   Significant Events: 5/16 admit  5/16 TTE - EF 55-60% - no WMA - 1.4cm mobile veg posterior leaflet mitral valve w/ severe regurg   Subjective: The patient is resting comfortably in bed post angiogram.  He denies chest pain shortness of breath fevers chills.  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 - septic emboli to abdom  Blood cx negative thus far this admit - cont Vanc - plan as per [facility] was to complete 4 weeks of abx and repeat TTE (was being dosed w/ PCN units/day > levaquin 750/day w/ last day of tx to be 01/26/18) - will ask ID to comment on tx options from this point forward on 5/21  B MCA subacute infarcts as well as right frontal high convexity small hemorrhage due to septic emboli Care per Neurology - cerebral angiogram revealed no evidence of a cerebral aneurysm   Severe mitral valve regurg Due to vegetation - noted on TTE 5/16  MRSA screen +  Polysubstance abuse  Homelessness CSW consult - care post-hospital will be quite difficult to coordinate   DVT prophylaxis: SCDs Code Status: FULL CODE Family Communication: no family present at time of exam  Disposition Plan: transfer to med/surg bed - ID consult for abx tx recs in AM - possible d/c next 24hrs    Consultants:  Neuro PCCM  Antimicrobials:  Zosyn 5/16 > 5/18 Vanc 5/16 >  Objective: Blood pressure 100/66, pulse 79, temperature 97.9 F (36.6 C), temperature source Oral, resp. rate 19, height  (1.803 m), weight 58.3 kg (128 lb 8.5 oz), SpO2 99 %.  Intake/Output Summary (Last 24 hours) at 01/18/2018 1555 Last data filed at 01/18/2018 0820 Gross per 24 hour  Intake 427 ml  Output 1950 ml  Net -1523 ml   Filed Weights   01/16/18 0848 01/16/18 2053 01/18/18 0500  Weight: 58.4 kg (128 lb 12 oz) 62.8 kg (138 lb 7.2 oz) 58.3 kg (128 lb 8.5 oz)    Examination: General: No acute respiratory distress  Lungs: Clear to auscultation bilaterally - no wheezing  Cardiovascular: 3/6 holosystolic M  Abdomen: Nontender, thin, soft, bowel sounds positive Extremities: No signif edema B LE   CBC: Recent Labs  Lab 01/16/18 0512 01/17/18 0355 01/18/18 0342  WBC 9.4 10.9* 10.4  HGB 8.0* 8.6* 8.1*  HCT 26.6* 28.7* 27.7*  MCV 87.2 88.9 88.5  PLT 432* 537* 524*   Basic Metabolic Panel: Recent Labs  Lab 01/15/18 0653 01/16/18 0512 01/17/18 0355 01/18/18 0342  NA 139 139 140 139  K 3.6 3.6 3.9 4.2  CL 108 107 106 104  CO2 GLUCOSE 99 102* 103* 94  BUN 5* 5* 11 15  CREATININE 0.74 0.73 0.83 0.72  CALCIUM 8.3* 7.9* 8.3* 8.6*  MG 1.8 2.0 1.8  --   PHOS 3.6 3.2 2.9  --    GFR: Estimated Creatinine Clearance: 101.2 mL/min (by C-G formula based on SCr of 0.72 mg/dL).  Liver Function Tests: Recent Labs  Lab 01/14/18 1006 01/17/18 0355 01/18/18 0342  AST 24 33 43*  ALT 32 34 42  ALKPHOS 57 61 59  BILITOT 0.6 0.5 0.4  PROT 6.8 6.8 7.1  ALBUMIN 1.8* 1.8* 1.9*    Recent Labs  Lab 01/14/18 1006  AMMONIA 12    Coagulation Profile: Recent Labs  Lab 01/14/18 0904 01/14/18 1105 01/15/18 0653 01/17/18 1200 01/18/18 0342  INR 1.76 1.87 1.98 1.18 1.18    HbA1C: Hgb A1c MFr Bld  Date/Time Value Ref Range Status  01/15/2018 06:53 AM 4.8 4.8 - 5.6  % Final    Comment:    (NOTE) Pre diabetes:          5.7%-6.4% Diabetes:              >6.4% Glycemic control for   <7.0% adults with diabetes   07/06/2016 06:41 AM 5.0 4.8 - 5.6 % Final    Comment:    (NOTE)         Pre-diabetes: 5.7 - 6.4         Diabetes: >6.4         Glycemic control for adults with diabetes: <7.0     CBG: Recent Labs  Lab 01/12/18 1937  GLUCAP 92    Recent Results (from the past 240 hour(s))  Blood culture (routine x 2)     Status: None (Preliminary result)   Collection Time: 01/14/18 10:29 AM  Result Value Ref Range Status   Specimen Description BLOOD RIGHT ANTECUBITAL  Final   Special Requests   Final    BOTTLES DRAWN AEROBIC AND ANAEROBIC Blood Culture adequate volume   Culture   Final    NO GROWTH 4 DAYS Performed at [facility], [address] [place], [place] [address]    Report Status PENDING  Incomplete  Blood culture (routine x 2)     Status: None (Preliminary result)   Collection Time: 01/14/18 11:05 AM  Result Value Ref Range Status   Specimen Description BLOOD RIGHT ANTECUBITAL  Final   Special Requests   Final    BOTTLES DRAWN AEROBIC AND ANAEROBIC Blood Culture adequate volume   Culture   Final    NO GROWTH 4 DAYS Performed at [facility], [address] [place], [place] [address]    Report Status PENDING  Incomplete  MRSA PCR Screening     Status: Abnormal   Collection Time: 01/14/18  3:46 PM  Result Value Ref Range Status   MRSA by PCR POSITIVE (A) NEGATIVE Final    Comment:        The GeneXpert MRSA Assay (FDA approved for NASAL specimens only), is one component of a comprehensive MRSA colonization surveillance program. It is not intended to diagnose MRSA infection nor to guide or monitor treatment for MRSA infections. RESULT CALLED TO, READ BACK BY AND VERIFIED WITH: R.PATERNOSTER,RN AT 1804 BY L.PITT 01/14/18      Scheduled Meds: . Chlorhexidine Gluconate Cloth  6 each Topical Q0600  .  famotidine  20 mg Oral BID  . feeding supplement (ENSURE ENLIVE)  237 mL Oral TID BM  . fentaNYL      . lidocaine      . lidocaine      .  midazolam      . mupirocin ointment  1 application Nasal BID  . senna-docusate  1 tablet Oral BID     LOS: 4 days   Lonia Blood, MD [facility] Office  [phone] Pager - Text Page per Amion as per below:  On-Call/Text Page:      [email or web address]      password [email or web address]  If 7PM-7AM, please contact night-coverage [email or web address] Password [facility] 01/18/2018, 3:55 PM

## 2018-01-18 NOTE — Sedation Documentation (Signed)
IR still holding pressure R brachial- R radial pulse strong

## 2018-01-19 ENCOUNTER — Encounter (HOSPITAL_COMMUNITY): Payer: Self-pay | Admitting: Interventional Radiology

## 2018-01-19 ENCOUNTER — Inpatient Hospital Stay (HOSPITAL_COMMUNITY): Payer: Medicaid Other

## 2018-01-19 DIAGNOSIS — B181 Chronic viral hepatitis B without delta-agent: Secondary | ICD-10-CM

## 2018-01-19 DIAGNOSIS — R5383 Other fatigue: Secondary | ICD-10-CM

## 2018-01-19 DIAGNOSIS — R51 Headache: Secondary | ICD-10-CM

## 2018-01-19 DIAGNOSIS — B953 Streptococcus pneumoniae as the cause of diseases classified elsewhere: Secondary | ICD-10-CM

## 2018-01-19 DIAGNOSIS — B1920 Unspecified viral hepatitis C without hepatic coma: Secondary | ICD-10-CM

## 2018-01-19 DIAGNOSIS — F419 Anxiety disorder, unspecified: Secondary | ICD-10-CM

## 2018-01-19 DIAGNOSIS — F1590 Other stimulant use, unspecified, uncomplicated: Secondary | ICD-10-CM

## 2018-01-19 DIAGNOSIS — Z590 Homelessness: Secondary | ICD-10-CM

## 2018-01-19 DIAGNOSIS — R42 Dizziness and giddiness: Secondary | ICD-10-CM

## 2018-01-19 DIAGNOSIS — Z91018 Allergy to other foods: Secondary | ICD-10-CM

## 2018-01-19 DIAGNOSIS — F17200 Nicotine dependence, unspecified, uncomplicated: Secondary | ICD-10-CM

## 2018-01-19 DIAGNOSIS — Z9114 Patient's other noncompliance with medication regimen: Secondary | ICD-10-CM

## 2018-01-19 DIAGNOSIS — R011 Cardiac murmur, unspecified: Secondary | ICD-10-CM

## 2018-01-19 DIAGNOSIS — Z7901 Long term (current) use of anticoagulants: Secondary | ICD-10-CM

## 2018-01-19 DIAGNOSIS — Z886 Allergy status to analgesic agent status: Secondary | ICD-10-CM

## 2018-01-19 DIAGNOSIS — R202 Paresthesia of skin: Secondary | ICD-10-CM

## 2018-01-19 DIAGNOSIS — R05 Cough: Secondary | ICD-10-CM

## 2018-01-19 DIAGNOSIS — Z91030 Bee allergy status: Secondary | ICD-10-CM

## 2018-01-19 DIAGNOSIS — M549 Dorsalgia, unspecified: Secondary | ICD-10-CM

## 2018-01-19 DIAGNOSIS — R5381 Other malaise: Secondary | ICD-10-CM

## 2018-01-19 DIAGNOSIS — I748 Embolism and thrombosis of other arteries: Secondary | ICD-10-CM

## 2018-01-19 DIAGNOSIS — R531 Weakness: Secondary | ICD-10-CM

## 2018-01-19 DIAGNOSIS — Z880 Allergy status to penicillin: Secondary | ICD-10-CM

## 2018-01-19 LAB — CULTURE, BLOOD (ROUTINE X 2)
CULTURE: NO GROWTH
CULTURE: NO GROWTH
Special Requests: ADEQUATE
Special Requests: ADEQUATE

## 2018-01-19 LAB — COMPREHENSIVE METABOLIC PANEL
ALK PHOS: 72 U/L (ref 38–126)
ALT: 54 U/L (ref 17–63)
ANION GAP: 7 (ref 5–15)
AST: 54 U/L — ABNORMAL HIGH (ref 15–41)
Albumin: 1.8 g/dL — ABNORMAL LOW (ref 3.5–5.0)
BUN: 20 mg/dL (ref 6–20)
CALCIUM: 8.4 mg/dL — AB (ref 8.9–10.3)
CHLORIDE: 105 mmol/L (ref 101–111)
CO2: 29 mmol/L (ref 22–32)
CREATININE: 0.68 mg/dL (ref 0.61–1.24)
GFR calc non Af Amer: >60 mL/min (ref >60)
Glucose, Bld: 97 mg/dL (ref 65–99)
Potassium: 4.2 mmol/L (ref 3.5–5.1)
Sodium: 141 mmol/L (ref 135–145)
Total Bilirubin: 0.4 mg/dL (ref 0.3–1.2)
Total Protein: 6.7 g/dL (ref 6.5–8.1)

## 2018-01-19 LAB — CBC
HEMATOCRIT: 27.8 % — AB (ref 39.0–52.0)
HEMOGLOBIN: 8.2 g/dL — AB (ref 13.0–17.0)
MCH: 26.5 pg (ref 26.0–34.0)
MCHC: 29.5 g/dL — AB (ref 30.0–36.0)
MCV: 90 fL (ref 78.0–100.0)
Platelets: 522 10*3/uL — ABNORMAL HIGH (ref 150–400)
RBC: 3.09 MIL/uL — ABNORMAL LOW (ref 4.22–5.81)
RDW: 16.5 % — ABNORMAL HIGH (ref 11.5–15.5)
WBC: 10.7 10*3/uL — ABNORMAL HIGH (ref 4.0–10.5)

## 2018-01-19 MED ORDER — PENICILLIN G POTASSIUM 5000000 UNITS IJ SOLR
4.0000 10*6.[IU] | INTRAVENOUS | Status: DC
  Administered 2018-01-19 – 2018-02-09 (×120): 4 10*6.[IU] via INTRAVENOUS
  Filled 2018-01-19 (×141): qty 4

## 2018-01-19 NOTE — Progress Notes (Signed)
Pt wanted medication before attempting MRI.  Per RN pt cannot get any meds at this time.  Pt refused to proceed with MRI. Pt sent back to room.

## 2018-01-19 NOTE — Consult Note (Addendum)
[facility]    Date of Admission:  01/14/2018     Total days of antibiotics 4/30 started PCN - 5/10 where Levaquin started   Day 5 vancomycin              Reason for Consult: Mitral valve endocarditis with multifocal embolization    Referring Provider: Sharon Seller  Primary Care Provider: none   Assessment: Kailash Hinze is a 42 y.o. male with Hep B (not worked up), Hep C (1a, tx naive), IVDU (meth) and known streptococcal pneumoniae (PCN MIC 0.25) endocarditis of the mitral valve with secondary multifocal septic embolisms (spleen, kidney, brain); also with partial occlusion of aorta 2/2 clot. He was treated at [facility] for this and signed out 01/08/18 with plan to continue Levaquin 750 mg QD through May 28th. Now with subacute watershed strokes noted on brain MRI. 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 He is homeless and has been all over [place], [place] and now [place] area looking for someone to stay with and will not likely be able to make follow up appointment with [facility] ID team as currently planned.   With ongoing back pain and LE weakness would check MRI thoracic/lumbar spine to evaluate back pain with recent bacteremia and lower extremity weakness.   Although he does not have signs of heart failure with ongoing/recurrent embolizations would be indication for surgery when dealing with endocarditis. Would recommend TCTS reevaluate need for surgery considering now with showering CNS 1 week out from completing IE treatment. Not surprising he has vegetation still present - unable to compare size to pre-treatment as no direct mention for size in [facility] echo report.   We also talked about the possibility of outpatient treatment for his Hep B/C if indicated. Will check Hep B DNA/Hep C RNA and geno.   Will follow, thank you for the opportunity to meet Mr. Vanderhoef.   Plan: 1. Stop Vancomycin  2. Start PCN 24 million  units QD (can do continuous infusion or break up in Q4h dosing).  3. Would check MRI thoracic and lumbar spine  4. Please consult TCTS for consideration of replacement/repair 5. Hep B DNA and eAg in AM 6. 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 famotidine  20 mg Oral BID  . feeding supplement (ENSURE ENLIVE)  237 mL Oral TID PC & HS  . mupirocin ointment  1 application Nasal BID  . senna-docusate  1 tablet Oral BID    HPI: Cambell Stanek is a 42 y.o. male with past medical history significant for poly sub abuse, chronic Hep B, Hep C infection and recently in April 2019 diagnosed with endocarditis of the mitral valve at [facility] with subtotal occlusion of aorta @ IMA level with splenic/renal infarctions. Strep pneumo isolated from blood cultures 1/2 on 4/30. He was given coumadin for anticoagulation outpatient but has not been taking regularly and levels have not been monitored frequently.   Admitted to [facility] 4/30 - 01/08/18 for strep pneumoniae endocarditis with secondary embolizations. Was seen by CT surgery and no surgical indications at that time. Was managed by [facility] ID team and recommended 4 weeks of IV therapy. He received PCN 24 million units  daily until 5/10 when he was transitioned to oral levofloxacin  QD.   He presented to [facility] on 01/14/18 with back pain and AMS. He was started on Vancomycin and zosyn in the ED and now maintained on Vancomycin alone. Blood cultures on 5/16 are no growth to date. TTE with LVEF 55-60% on 5/16 with 1.4cm mobile vegetation on posterior leaflet of mitral valve with severe regurgitation. CXR negative for CHF or other process. MRI of head with multifocal subacute bilateral MCA/watershed territory infarcts suspicious for septic emboli as  well as evolving mycotic aneurysm. Neurology and vascular surgery have seen the patient. Cerebral angiogram performed 01/18/18 w/o concern for intracranial aneurysm.   Since discharge he has been to multiple ERs for various reasons including pain/weakness. Feels very "chostrophobic" now and anxious to go outside and get fresh air. Injects meth for management of his back pain. Heroin in the past and has been on suboxone but not recently consistently.   Review of Systems: Review of Systems  Constitutional: Positive for malaise/fatigue. Negative for chills and fever.  HENT: Negative for hearing loss.   Respiratory: Positive for cough. Negative for sputum production and shortness of breath.   Cardiovascular: Negative for chest pain, orthopnea and leg swelling.  Gastrointestinal: Negative for abdominal pain, diarrhea and vomiting.  Genitourinary: Negative for dysuria.  Musculoskeletal: Positive for back pain. Negative for falls, joint pain, myalgias and neck pain.  Skin: Negative for rash.  Neurological: Positive for dizziness, tingling, sensory change (tingling in LEs new over the last few months ), weakness and headaches. Negative for speech change. Focal weakness: lower extremities     Past Medical History:  Diagnosis Date  . Hepatitis C     Social History   Tobacco Use  . Smoking status: Current Every Day Smoker  . Smokeless tobacco: Never Used  Substance Use Topics  . Alcohol use: No  . Drug use: Yes    Frequency: 7.0 times per week    Types: Methamphetamines, Heroin    Comment: Daily use where possible    History reviewed. No pertinent family history. Allergies  Allergen Reactions  . Bee Venom Anaphylaxis  . Food Hives and Other (See Comments)    grapefruit  . Penicillins Other (See Comments)    Possible reaction - pt unsure Has patient had a PCN reaction causing immediate rash, facial/tongue/throat swelling, SOB or lightheadedness with hypotension: unk Has patient had a  PCN reaction causing severe rash involving mucus membranes or skin necrosis: unk Has patient had a PCN reaction that required hospitalization: unk Has patient had a PCN reaction occurring within the last 10 years: No If all of the above answers are "NO", then may proceed with Cephalosporin use.   . Tylenol [Acetaminophen] Other (See Comments)    Pt states that he is not supposed to take this medication because of his Hep C.     OBJECTIVE: Blood pressure 102/68, pulse 85, temperature 98.5 F (36.9 C), temperature source Oral, resp. rate 20, height  (1.803 m), weight 139 lb 1.8 oz (63.1 kg), SpO2 99 %.  Physical Exam  Constitutional: He is oriented to person, place, and time.  Thin, disheveled appearing man lying in bed. Anxious   HENT:  Head: Normocephalic.  Mouth/Throat: No oral lesions. Normal dentition. No dental caries.  Eyes: Pupils are equal, round, and reactive to light. No scleral icterus.  Neck: Normal range of motion.  Cardiovascular: Normal rate, regular rhythm and intact distal pulses.  Murmur heard.  Systolic murmur  is present. Pulmonary/Chest: Effort normal and breath sounds normal. No accessory muscle usage. No tachypnea. No respiratory distress.  Abdominal: Soft. He exhibits no distension and no ascites. There is no tenderness.  Musculoskeletal:       Right lower leg: Normal.       Left lower leg: Normal.  Lymphadenopathy:    He has no cervical adenopathy.  Neurological: He is alert and oriented to person, place, and time.  Skin: Skin is warm and dry. No rash noted.  Psychiatric: His mood appears anxious.  Vitals reviewed.   Lab Results Lab Results  Component Value Date   WBC 10.7 (H) 01/19/2018   HGB 8.2 (L) 01/19/2018   HCT 27.8 (L) 01/19/2018   MCV 90.0 01/19/2018   PLT 522 (H) 01/19/2018    Lab Results  Component Value Date   CREATININE 0.68 01/19/2018   BUN 20 01/19/2018   NA 141 01/19/2018   K 4.2 01/19/2018   CL 105 01/19/2018   CO2 29  01/19/2018    Lab Results  Component Value Date   ALT 54 01/19/2018   AST 54 (H) 01/19/2018   ALKPHOS 72 01/19/2018   BILITOT 0.4 01/19/2018     Microbiology: BCx 4/30 >> 1/2 Strep Pneumo BCx 5/16 >> NGTD   [name], MSN, NP-C [facility] Cell: [phone] Pager: [phone]  01/19/2018 10:09 AM

## 2018-01-19 NOTE — Progress Notes (Signed)
[place] TEAM 1 - Stepdown/ICU TEAM  Marcus [name]  UJW:[id number] DOB: 1976/06/20 DOA: 01/14/2018 PCP: [facility]    Brief Narrative:  41yo homeless man w/ a hx of polysubstance abuse, Hep B and Hep C, and depression/SI who was diagnosed with endocarditis in April 2019 and treated originally at [facility]. 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  He had been on anticoagualtion with coumadin. He presented to the [facility] ED w/ c/o back pain, and was found to be encephalopathic.   Significant Events: 5/16 admit  5/16 TTE - EF 55-60% - no WMA - 1.4cm mobile veg posterior leaflet mitral valve w/ severe regurg   Subjective: The pt is comfortable in bed.  He denies cp, sob, n/v.  He is somewhat fidgety.  He tells me he has nowhere to go after d/c, and does not know of any resources to help him.    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 - septic emboli to abdom  Blood cx negative thus far this admit - cont Vanc - plan as per [facility] was to complete 4 weeks of abx and repeat TTE (was being dosed w/ PCN units/day > levaquin 750/day w/ last day of tx to be 01/26/18) - ID consulted for evaluation today   B MCA subacute infarcts as well as right frontal high convexity small hemorrhage due to septic emboli Care per Neurology - cerebral angiogram revealed no evidence of a cerebral aneurysm - no further w/u required per Neuro   Severe mitral valve regurg Due to vegetation - noted on TTE 5/16 - plan at [facility] was apparently to f/u after abx tx course - await further suggestions from ID   MRSA screen + On contact precautions   Polysubstance abuse No evidence of ongoing use in the hospital   Homelessness CSW consult - care post-hospital will be quite difficult to  coordinate   DVT prophylaxis: SCDs Code Status: FULL CODE Family Communication: no family present at time of exam  Disposition Plan: ID consult for abx tx recs and suggestions for further w/u / tx options    Consultants:  Neuro PCCM  Antimicrobials:  Zosyn 5/16 > 5/18 Vanc 5/16 >  Objective: Blood pressure 105/66, pulse 95, temperature (!) 97.5 F (36.4 C), temperature source Oral, resp. rate (!) 28, height  (1.803 m), weight 63.1 kg (139 lb 1.8 oz), SpO2 100 %.  Intake/Output Summary (Last 24 hours) at 01/19/2018 1638 Last data filed at 01/19/2018 0245 Gross per 24 hour  Intake 240 ml  Output 525 ml  Net -285 ml   Filed Weights   01/16/18 2053 01/18/18 0500 01/19/18 0245  Weight: 62.8 kg (138 lb 7.2 oz) 58.3 kg (128 lb 8.5 oz) 63.1 kg (139 lb 1.8 oz)    Examination: General: No acute respiratory distress - disheveled  Lungs: Clear to auscultation B Cardiovascular: 3/6 holosystolic M w/o change  Abdomen: NT/ND, thin, soft, BS+ Extremities: No edema B LE   CBC: Recent Labs  Lab 01/17/18 0355 01/18/18 0342 01/19/18 0339  WBC 10.9* 10.4 10.7*  HGB 8.6* 8.1* 8.2*  HCT 28.7* 27.7* 27.8*  MCV 88.9 88.5 90.0  PLT 537* 524* 522*   Basic Metabolic Panel: Recent Labs  Lab 01/15/18 0653 01/16/18  1610 01/17/18 0355 01/18/18 0342 01/19/18 0339  NA 139 139 140 139 141  K 3.6 3.6 3.9 4.2 4.2  CL 108 107 106 104 105  CO2 GLUCOSE 99 102* 103* 94 97  BUN 5* 5* CREATININE 0.74 0.73 0.83 0.72 0.68  CALCIUM 8.3* 7.9* 8.3* 8.6* 8.4*  MG 1.8 2.0 1.8  --   --   PHOS 3.6 3.2 2.9  --   --    GFR: Estimated Creatinine Clearance: 109.5 mL/min (by C-G formula based on SCr of 0.68 mg/dL).  Liver Function Tests: Recent Labs  Lab 01/14/18 1006 01/17/18 0355 01/18/18 0342 01/19/18 0339  AST 24 33 43* 54*  ALT 32 34 42 54  ALKPHOS 57 61 59 72  BILITOT 0.6 0.5 0.4 0.4  PROT 6.8 6.8 7.1 6.7  ALBUMIN 1.8* 1.8* 1.9* 1.8*    Recent Labs    Lab 01/14/18 1006  AMMONIA 12    Coagulation Profile: Recent Labs  Lab 01/14/18 0904 01/14/18 1105 01/15/18 0653 01/17/18 1200 01/18/18 0342  INR 1.76 1.87 1.98 1.18 1.18    HbA1C: Hgb A1c MFr Bld  Date/Time Value Ref Range Status  01/15/2018 06:53 AM 4.8 4.8 - 5.6 % Final    Comment:    (NOTE) Pre diabetes:          5.7%-6.4% Diabetes:              >6.4% Glycemic control for   <7.0% adults with diabetes   07/06/2016 06:41 AM 5.0 4.8 - 5.6 % Final    Comment:    (NOTE)         Pre-diabetes: 5.7 - 6.4         Diabetes: >6.4         Glycemic control for adults with diabetes: <7.0     CBG: Recent Labs  Lab 01/12/18 1937  GLUCAP 92    Recent Results (from the past 240 hour(s))  Blood culture (routine x 2)     Status: None   Collection Time: 01/14/18 10:29 AM  Result Value Ref Range Status   Specimen Description BLOOD RIGHT ANTECUBITAL  Final   Special Requests   Final    BOTTLES DRAWN AEROBIC AND ANAEROBIC Blood Culture adequate volume   Culture   Final    NO GROWTH 5 DAYS Performed at [facility], [address], [place], [place] [address]    Report Status 01/19/2018 FINAL  Final  Blood culture (routine x 2)     Status: None   Collection Time: 01/14/18 11:05 AM  Result Value Ref Range Status   Specimen Description BLOOD RIGHT ANTECUBITAL  Final   Special Requests   Final    BOTTLES DRAWN AEROBIC AND ANAEROBIC Blood Culture adequate volume   Culture   Final    NO GROWTH 5 DAYS Performed at [facility], [address] [place], [place] [address]    Report Status 01/19/2018 FINAL  Final  MRSA PCR Screening     Status: Abnormal   Collection Time: 01/14/18  3:46 PM  Result Value Ref Range Status   MRSA by PCR POSITIVE (A) NEGATIVE Final    Comment:        The GeneXpert MRSA Assay (FDA approved for NASAL specimens only), is one component of a comprehensive MRSA colonization surveillance program. It is not intended to diagnose  MRSA infection nor to guide or monitor treatment for MRSA infections.  RESULT CALLED TO, READ BACK BY AND VERIFIED WITH: R.PATERNOSTER,RN AT 1804 BY L.PITT 01/14/18      Scheduled Meds: . famotidine  20 mg Oral BID  . feeding supplement (ENSURE ENLIVE)  237 mL Oral TID PC & HS  . mupirocin ointment  1 application Nasal BID  . senna-docusate  1 tablet Oral BID     LOS: 5 days   Lonia Blood, MD [facility] Office  [phone] Pager - Text Page per Amion as per below:  On-Call/Text Page:      [email or web address]      password [email or web address]  If 7PM-7AM, please contact night-coverage [email or web address] Password [facility] 01/19/2018, 4:38 PM

## 2018-01-20 ENCOUNTER — Encounter (HOSPITAL_COMMUNITY): Payer: Self-pay

## 2018-01-20 ENCOUNTER — Inpatient Hospital Stay (HOSPITAL_COMMUNITY): Payer: Medicaid Other

## 2018-01-20 DIAGNOSIS — M549 Dorsalgia, unspecified: Secondary | ICD-10-CM

## 2018-01-20 DIAGNOSIS — B182 Chronic viral hepatitis C: Secondary | ICD-10-CM

## 2018-01-20 DIAGNOSIS — N280 Ischemia and infarction of kidney: Secondary | ICD-10-CM

## 2018-01-20 DIAGNOSIS — B181 Chronic viral hepatitis B without delta-agent: Secondary | ICD-10-CM

## 2018-01-20 DIAGNOSIS — F1510 Other stimulant abuse, uncomplicated: Secondary | ICD-10-CM

## 2018-01-20 MED ORDER — LORAZEPAM 2 MG/ML IJ SOLN
1.0000 mg | Freq: Once | INTRAMUSCULAR | Status: AC
  Administered 2018-01-20: 2 mg via INTRAVENOUS
  Filled 2018-01-20: qty 1

## 2018-01-20 NOTE — Plan of Care (Signed)
  Problem: Coping: Goal: Level of anxiety will decrease Outcome: Progressing   Problem: Pain Managment: Goal: General experience of comfort will improve Outcome: Progressing   Problem: Education: Goal: Knowledge of disease or condition will improve Outcome: Progressing

## 2018-01-20 NOTE — Progress Notes (Addendum)
PROGRESS NOTE    Marcus [name]  EAV:[id number] DOB: 04/13/1976 DOA: 01/14/2018 PCP: [facility]   Brief Narrative: 41yo homeless man w/ a hx of polysubstance abuse, Hep B and Hep C, and depression/SI who was diagnosed with endocarditis in April 2019 and treated originally at [facility]. 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  He had been on anticoagualtion with coumadin. He presented to the [facility] ED w/ c/o back pain, and was found to be encephalopathic. He underwent TTE showing a 1.4 cm mobile vegetation on the posterior leaflet on the MV with severe regurgitation.     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  Blood cultures this admit negative so far.  Current only penicillin IV as per ID this admission.  TTE on admit  Cardiothoracic surgery evaluation for valve surgery.    Severe MR from the vegetations: Further recommendations from cardio thoracic surgery.   MCA infarcts due to septic emboli:  No further work up as per neurology.  MRI Brain: Multifocal subacute bilateral MCA/watershed territory infarcts and right frontal high convexity small hemorrhage. No anti coagulation as per neurology.  Cerebral angiogram on 5. 20 does not show any mycotic aneurysm.  Recommend outpatient follow up with stroke clinic NP at [facility] in about 4 weeks.    Polysubstance abuse:  No signs of withdrawal at this t ime.    Homeless ness: CSW consulted.      DVT prophylaxis:scd's Code Status: full code.   Family Communication: none at bedside.  Disposition Plan: pending evaluation by cardiothoracic surgery   Consultants:   ID Dr Drue Second  Cardiothoracic surgery    Procedures: TTE on 5/16.   Antimicrobials: PENICILLIN G   Subjective: Wants to go outside and get some fresh air.  Requesting pain medications.  Objective: Vitals:   01/19/18 1034 01/19/18 1959 01/20/18 0309 01/20/18 0317  BP: 105/66 96/65  97/65  Pulse: 95 (!) 106  100  Resp: (!) 28 18    Temp: (!) 97.5 F (36.4 C) 98.8 F (37.1 C)  97.8 F (36.6 C)  TempSrc: Oral Oral  Oral  SpO2: 100% 100%  98%  Weight:   60.7 kg (133 lb 12.8 oz)   Height:        Intake/Output Summary (Last 24 hours) at 01/20/2018 1124 Last data filed at 01/20/2018 0406 Gross per 24 hour  Intake 2310 ml  Output -  Net 2310 ml   Filed Weights   01/18/18 0500 01/19/18 0245 01/20/18 0309  Weight: 58.3 kg (128 lb 8.5 oz) 63.1 kg (139 lb 1.8 oz) 60.7 kg (133 lb 12.8 oz)    Examination:  General exam: Appears calm and comfortable  Respiratory system: Clear to auscultation. Respiratory effort normal. Cardiovascular system: S1 & S2 heard, RRR. No JVD,  Systolic murmer present.  No pedal edema. Gastrointestinal system: Abdomen is nondistended, soft and nontender. No organomegaly or masses felt. Normal bowel sounds heard. Central nervous system: Alert and oriented. Non focal. Extremities: Symmetric 5 x 5  power. Skin: No rashes, lesions or ulcers Psychiatry:  Mood & affect appropriate.     Data Reviewed: I have personally reviewed following labs and imaging studies  CBC: Recent Labs  Lab 01/15/18 0653 01/16/18 0512 01/17/18 0355 01/18/18 0342 01/19/18 0339  WBC 11.5* 9.4 10.9* 10.4 10.7*  HGB 8.2* 8.0* 8.6* 8.1* 8.2*  HCT 27.1* 26.6* 28.7* 27.7* 27.8*  MCV 87.7 87.2 88.9 88.5 90.0  PLT 417* 432* 537* 524* 522*   Basic Metabolic Panel: Recent Labs  Lab 01/15/18 0653 01/16/18 0512 01/17/18 0355 01/18/18 0342  01/19/18 0339  NA 139 139 140 139 141  K 3.6 3.6 3.9 4.2 4.2  CL 108 107 106 104 105  CO2 GLUCOSE 99 102* 103* 94 97  BUN 5* 5* CREATININE 0.74 0.73 0.83 0.72 0.68  CALCIUM 8.3* 7.9* 8.3* 8.6* 8.4*  MG 1.8 2.0 1.8  --   --   PHOS 3.6 3.2 2.9  --   --    GFR: Estimated Creatinine Clearance: 105.4 mL/min (by C-G formula based on SCr of 0.68 mg/dL). Liver Function Tests: Recent Labs  Lab 01/14/18 1006 01/17/18 0355 01/18/18 0342 01/19/18 0339  AST 24 33 43* 54*  ALT 32 34 42 54  ALKPHOS 57 61 59 72  BILITOT 0.6 0.5 0.4 0.4  PROT 6.8 6.8 7.1 6.7  ALBUMIN 1.8* 1.8* 1.9* 1.8*   No results for input(s): LIPASE, AMYLASE in the last 168 hours. Recent Labs  Lab 01/14/18 1006  AMMONIA 12   Coagulation Profile: Recent Labs  Lab 01/14/18 0904 01/14/18 1105 01/15/18 0653 01/17/18 1200 01/18/18 0342  INR 1.76 1.87 1.98 1.18 1.18   Cardiac Enzymes: No results for input(s): CKTOTAL, CKMB, CKMBINDEX, TROPONINI in the last 168 hours. BNP (last 3 results) No results for input(s): PROBNP in the last 8760 hours. HbA1C: No results for input(s): HGBA1C in the last 72 hours. CBG: No results for input(s): GLUCAP in the last 168 hours. Lipid Profile: No results for input(s): CHOL, HDL, LDLCALC, TRIG, CHOLHDL, LDLDIRECT in the last 72 hours. Thyroid Function Tests: No results for input(s): TSH, T4TOTAL, FREET4, T3FREE, THYROIDAB in the last 72 hours. Anemia Panel: No results for input(s): VITAMINB12, FOLATE, FERRITIN, TIBC, IRON, RETICCTPCT in the last 72 hours. Sepsis Labs: Recent Labs  Lab 01/14/18 1031  LATICACIDVEN 0.83    Recent Results (from the past 240 hour(s))  Blood culture (routine x 2)     Status: None   Collection Time: 01/14/18 10:29 AM  Result Value Ref Range Status   Specimen Description BLOOD RIGHT ANTECUBITAL  Final   Special Requests   Final    BOTTLES DRAWN AEROBIC AND ANAEROBIC Blood Culture adequate volume   Culture   Final     NO GROWTH 5 DAYS Performed at [facility], [address], [place], [place] [address]    Report Status 01/19/2018 FINAL  Final  Blood culture (routine x 2)     Status: None   Collection Time: 01/14/18 11:05 AM  Result Value Ref Range Status   Specimen Description BLOOD RIGHT ANTECUBITAL  Final   Special Requests   Final    BOTTLES DRAWN AEROBIC AND ANAEROBIC Blood Culture adequate volume   Culture   Final    NO GROWTH 5 DAYS Performed at [facility], [address] [place], [place] [address]    Report Status 01/19/2018 FINAL  Final  MRSA PCR Screening  Status: Abnormal   Collection Time: 01/14/18  3:46 PM  Result Value Ref Range Status   MRSA by PCR POSITIVE (A) NEGATIVE Final    Comment:        The GeneXpert MRSA Assay (FDA approved for NASAL specimens only), is one component of a comprehensive MRSA colonization surveillance program. It is not intended to diagnose MRSA infection nor to guide or monitor treatment for MRSA infections. RESULT CALLED TO, READ BACK BY AND VERIFIED WITH: R.PATERNOSTER,RN AT 1804 BY L.PITT 01/14/18          Radiology Studies: No results found.      Scheduled Meds: . famotidine  20 mg Oral BID  . feeding supplement (ENSURE ENLIVE)  237 mL Oral TID PC & HS  . LORazepam  1-2 mg Intravenous Once  . senna-docusate  1 tablet Oral BID   Continuous Infusions: . pencillin G potassium IV Stopped (01/20/18 0406)     LOS: 6 days    Time spent: 35 minutes    Kathlen Mody, MD [facility] Pager [phone]   If 7PM-7AM, please contact night-coverage [email or web address] Password [facility] 01/20/2018, 11:24 AM

## 2018-01-20 NOTE — Progress Notes (Signed)
Limmited T-spine images.Patient uncooperative, had meds still unable to hold still. Will page provider to update. RN was notified.

## 2018-01-20 NOTE — Consult Note (Addendum)
Cardiology Consultation:   Patient ID: Marcus [name]; [id number]; [birth year]   Admit date: 01/14/2018 Date of Consult: 01/20/2018  Primary Care Provider: [facility] Primary Cardiologist: Dr Duke Salvia, new Primary Electrophysiologist:  n/a   Patient Profile:   Marcus [name] is a 42 y.o. male with a hx of  IV drug use, Hep B.  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 PCN>>Levaquin at d/c. Heparin>>coumadin, seen by vascular surgery who recommended anticoagulation, no surgical intervention possible, they noted a poor prognosis and recommended palliative care.  Patient left AMA he was given 7 days of medications and supplies.  [facility], ER visit 5/14-15 for dizziness and weakness, evaluated by psychiatry and [facility] admission not needed, no acute issues on labs or CXR, discharged approximately 3 PM. [place] 5/15 at approximately 6 PM complaining of weakness, dizziness and back pain. Admitted 05/16 w/ AMS, back pain, persistent vegetation of MV on TTE and bilateral MCA/watershed infarctions suspicious for septic emboli. Mycotic aneurysm ruled out.   He is being seen today for the evaluation of endocarditis at the request of Dr Blake Divine.  History of Present Illness:   Marcus [name] is mainly complaining of back pain at this time.  He states that his back is been hurting for years.  He appears uncomfortable and feels better lying on his left side.  He states he has had chest pain, he had some before his ER visit on 5/15 at home.  He stated this happened in the setting of him overexerting himself.  He stated when he left [facility], he was too weak to walk to the bus station but they would not give him a wheelchair ride so he had to walk.  It was too much for him and he feels this is what ended him up back in the  hospital.  He is bored.  His chest pain has resolved and has not returned.  He denies shortness of breath.  He is oriented to name and place but not date which is not surprising as he has been in the hospital for most of the last 3 weeks.  At [facility] on 5/16, he had 2 large areas of the brain that appeared to be septic emboli, one in each of the hemispheres.  There were no gross focal deficits.  Vegetation was seen on his mitral valve and he was put on Vanco and Zosyn.  He was seen by neurology but was neurologically intact.  There was a small hemorrhage, so Coumadin and aspirin were discontinued.  A surgical consult was requested and cardiology was asked to see him as well to determine options for his valvular abnormalities.  He is currently not having chest pain.  He is currently not short of breath.  He is able to answer questions appropriately.  He is not in acute distress except for his back pain.   Past Medical History:  Diagnosis Date  . Hepatitis B 12/29/2017   at [facility]  . Hepatitis C   . 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 APPENDECTOMY    . IR ANGIO  INTRA EXTRACRAN SEL COM CAROTID INNOMINATE BILAT MOD SED  01/18/2018  . IR ANGIO VERTEBRAL SEL SUBCLAVIAN INNOMINATE UNI L MOD SED  01/18/2018  . IR ANGIO VERTEBRAL SEL VERTEBRAL UNI R MOD SED  01/18/2018     Prior to Admission medications   Medication Sig Start Date End Date Taking? Authorizing Provider  levofloxacin (LEVAQUIN) 750 MG tablet Take 750 mg by mouth daily.   Yes [provider]  MELATONIN PO Take 2 tablets by mouth daily as needed (Sleep).   Yes [provider]  warfarin (COUMADIN) 5 MG tablet Take 5 mg by mouth daily. 01/06/18  Yes [provider]    Inpatient Medications: Scheduled Meds: . famotidine  20 mg Oral BID  . feeding supplement (ENSURE ENLIVE)  237 mL Oral TID PC & HS  . LORazepam  1-2 mg Intravenous Once  . senna-docusate  1 tablet Oral BID   Continuous Infusions: . pencillin G potassium IV Stopped (01/20/18 1352)   PRN Meds: acetaminophen, iohexol, iopamidol  Allergies:    Allergies  Allergen Reactions  . Bee Venom Anaphylaxis  . Food Hives and Other (See Comments)    grapefruit  . Tylenol [Acetaminophen] Other (See Comments)    Pt states that he is not supposed to take this medication because of his Hep C.     Social History:   Social History   Socioeconomic History  . Marital status: Single    Spouse name: Not on file  . Number of children: Not on file  . Years of education: Not on file  . Highest education level: Not on file  Occupational History  . Occupation: Unemployed  Social Needs  . Financial resource strain: Not on file  . Food insecurity:    Worry: Not on file    Inability: Not on file  . Transportation needs:    Medical: Not on file    Non-medical: Not on file  Tobacco Use  . Smoking status: Current Every Day Smoker  . Smokeless tobacco: Never Used  Substance and Sexual Activity  . Alcohol use: No  . Drug use: Yes    Frequency: 7.0 times per week    Types: Methamphetamines, Heroin    Comment: Daily use where possible  . Sexual activity: Not Currently  Lifestyle  . Physical activity:    Days per week: Not on file    Minutes per session: Not on file  . Stress: Not on file  Relationships  . Social connections:    Talks on phone: Not on file    Gets together: Not on file    Attends religious service: Not on file    Active member of club or organization: Not on file    Attends meetings of clubs or organizations: Not on file    Relationship status: Not on file  . Intimate partner violence:    Fear of current or ex partner: Not on file    Emotionally abused: Not on file     Physically abused: Not on file    Forced sexual activity: Not on file  Other Topics Concern  . Not on file  Social History Narrative   Homeless    Family History:   History reviewed. No pertinent family history. Family Status:  Family Status  Relation Name Status  . Mother  Alive  . Father  Alive    ROS:  Please see the history of present illness.  All other ROS reviewed and negative.  Physical Exam/Data:   Vitals:   01/19/18 1959 01/20/18 0309 01/20/18 0317 01/20/18 1646  BP: 96/65  97/65 106/82  Pulse: (!) 106  100   Resp: 18     Temp: 98.8 F (37.1 C)  97.8 F (36.6 C) 98.4 F (36.9 C)  TempSrc: Oral  Oral Oral  SpO2: 100%  98%   Weight:  133 lb 12.8 oz (60.7 kg)    Height:        Intake/Output Summary (Last 24 hours) at 01/20/2018 1702 Last data filed at 01/20/2018 0406 Gross per 24 hour  Intake 2310 ml  Output -  Net 2310 ml   Filed Weights   01/18/18 0500 01/19/18 0245 01/20/18 0309  Weight: 128 lb 8.5 oz (58.3 kg) 139 lb 1.8 oz (63.1 kg) 133 lb 12.8 oz (60.7 kg)   Body mass index is 18.66 kg/m.  General:  Well nourished, well developed, in no acute distress HEENT: normal Lymph: no adenopathy Neck: no JVD Endocrine:  No thryomegaly Vascular: No carotid bruits; 4/4 extremity pulses 2+, without bruits  Cardiac:  normal S1, S2; RRR; 3/6 murmur  Lungs:  clear to auscultation bilaterally, no wheezing, rhonchi or rales  Abd: soft, nontender, no hepatomegaly  Ext: no edema Musculoskeletal:  No deformities, BUE strength normal and equal, legs are weak Skin: warm and dry  Neuro:  CNs 2-12 intact, no focal abnormalities noted Psych:  Normal affect   EKG:  The EKG was personally reviewed and demonstrates:  05/16, SR, HR 95, LVH, PVCs Telemetry:  Telemetry was personally reviewed and demonstrates:  Not on  Relevant CV Studies:  ECHO: 01/14/2018  - Left ventricle: The cavity size was severely dilated. Systolic   function was normal. The estimated  ejection fraction was in the   range of 55% to 60%. Wall motion was normal; there were no   regional wall motion abnormalities. Left ventricular diastolic   function parameters were normal. - Aortic valve: Transvalvular velocity was within the normal range.   There was no stenosis. There was no regurgitation. - Mitral valve: There was a medium-sized, 1.4 cm (L) x 0.4 cm (W),   mobile vegetation on the posterior leaflet. There was severe   regurgitation directed posteriorly. - Left atrium: The atrium was severely dilated. - Right ventricle: The cavity size was normal. Wall thickness was   normal. Systolic function was normal. - Tricuspid valve: There was mild regurgitation. - Pulmonary arteries: Systolic pressure was mildly increased. PA   peak pressure: 42 mm Hg (S).  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 Consider TEE to better   evaluate if clinically indicated.  ECHO: AT [facility] 12/30/2017 SUMMARY The left ventricle is mildly dilated. There is normal left ventricular wall thickness. LV ejection fraction = 60-65%.  Left ventricular systolic function is normal. The left ventricular wall motion is normal.  Left ventricular filling pattern is normal. The right ventricle is normal size. The right ventricular systolic function is normal. The left atrium is mildly dilated. There is vegetation seen on the atrial side of the posterior mitral leaflet  with severe mitral regurgitation There was insufficient TR detected to calculate RV systolic pressure. There is no pericardial effusion. There is no comparison study available. - FINDINGS:  LEFT VENTRICLE There is normal left ventricular wall thickness. The left ventricle is mildly  dilated. LV ejection fraction = 60-65%. Left ventricular systolic function is  normal. Left ventricular filling pattern  is normal. The left ventricular wall  motion is  normal. -  RIGHT VENTRICLE The right ventricle is normal size. The right ventricular systolic function  is normal.  LEFT ATRIUM The left atrium is mildly dilated.  RIGHT ATRIUM  Right atrial size is normal. - AORTIC VALVE Structurally normal aortic valve. There is no aortic stenosis. There is no  aortic regurgitation. - MITRAL VALVE There is trivial mitral valve thickening. The mitral regurgitant jet is  eccentrically directed. There is severe mitral regurgitation. There is  vegetation seen on the atrial side of the posterior mitral leaflet with  severe mitral regurgitation. The regurgitant jet is posteriorly-directed. - TRICUSPID VALVE Structurally normal tricuspid valve. There is trace tricuspid regurgitation.  There was insufficient TR detected to calculate RV systolic pressure. - PULMONIC VALVE Structurally normal pulmonic valve. There is no pulmonic valvular  regurgitation. - ARTERIES The aortic root is normal size. - VENOUS Pulmonary venous flow pattern is normal. IVC size was normal. - EFFUSION There is no pericardial effusion. - - MMode/2D Measurements & Calculations IVSd: 0.71 cm LVIDd: 6.1 cm LVPWd: 0.86 cm LVIDs: 4.5 cm LA dim: 3.8 cm Ao root: 3.5 cm EDV(MOD-sp4): 174.0 ml ESV(MOD-sp4): 70.1 ml EDV(MOD-sp2): 88.0 ml ESV(MOD-sp2): 31.3 ml LVOT diam: 2.6 cm SV(MOD-sp4): 103.9 ml SI(MOD-sp4): 52.8 ml/m2 LA area A2: 20.4 cm2 LA area A4: 22.6 cm2 LA vol: 69.9 ml LA vol index: 35.5 ml/m2 RA area A4: 12.8 cm2 Doppler Measurements & Calculations MV E max vel: 138.6 cm/sec MV A max vel: 67.5 cm/sec MV E/A: 2.1 Med Peak E' Vel: 14.6 cm/sec Lat Peak E' Vel: 17.6 cm/sec E/Lat E`: 7.9 E/Med E`: 9.5 MV dec time: 0.22 sec SV(LVOT): 101.6 ml Ao V2 max: 126.8 cm/sec Ao max PG: 6.4 mmHg Ao V2 mean: 78.8 cm/sec Ao mean PG: 2.9 mmHg Ao V2 VTI: 21.9 cm AVA (VTI): 4.6 cm2 LV V1 VTI: 18.7 cm MR ERO: 0.20 cm2 MR PISA radius: 0.79 cm MR alias vel:  26.0 cm/sec RAP systole: 5.0 mmHg AS Dimensionless Index (VTI): 0.85 AVAi(VTI) cm^2/m^2: 2.4 cm2 SV index(LVOT): 51.6 ml/m2  Laboratory Data:  Chemistry Recent Labs  Lab 01/17/18 0355 01/18/18 0342 01/19/18 0339  NA 140 139 141  K 3.9 4.2 4.2  CL 106 104 105  CO2 GLUCOSE 103* 94 97  BUN CREATININE 0.83 0.72 0.68  CALCIUM 8.3* 8.6* 8.4*  GFRNONAA >60 >60 >60  GFRAA >60 >60 >60  ANIONGAP Lab Results  Component Value Date   ALT 54 01/19/2018   AST 54 (H) 01/19/2018   ALKPHOS 72 01/19/2018   BILITOT 0.4 01/19/2018   Hematology Recent Labs  Lab 01/17/18 0355 01/18/18 0342 01/19/18 0339  WBC 10.9* 10.4 10.7*  RBC 3.23* 3.13* 3.09*  HGB 8.6* 8.1* 8.2*  HCT 28.7* 27.7* 27.8*  MCV 88.9 88.5 90.0  MCH 26.6 25.9* 26.5  MCHC 30.0 29.2* 29.5*  RDW 16.0* 16.4* 16.5*  PLT 537* 524* 522*     Recent Labs  Lab 01/13/18 2033 01/14/18 0800  TROPIPOC 0.00 0.00     DDimer  Recent Labs  Lab 01/14/18 1105  DDIMER 2.14*   TSH:  Lab Results  Component Value Date   TSH 1.213 07/06/2016   Lipids: Lab Results  Component Value Date   CHOL 100 01/15/2018   HDL 27 (L) 01/15/2018   LDLCALC 64 01/15/2018   TRIG 43 01/15/2018   CHOLHDL 3.7 01/15/2018   HgbA1c:  Lab Results  Component Value Date   HGBA1C 4.8 01/15/2018   Magnesium:  Magnesium  Date Value Ref Range Status  01/17/2018 1.8 1.7 - 2.4 mg/dL Final    Comment:    Performed at [facility], [address], [place], [place] [address]   Lab Results  Component Value Date   INR 1.18 01/18/2018   INR 1.18 01/17/2018   INR 1.98 01/15/2018     Radiology/Studies:  Ir Angio Intra Extracran Sel Com Carotid Innominate Bilat Mod Sed  Result Date: 01/19/2018 CLINICAL DATA:  Focal right frontal hemorrhage. 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 EXAM: IR ANGIO VERTEBRAL SEL VERTEBRAL UNI RIGHT MOD SED; IR ULTRASOUND GUIDANCE VASC ACCESS RIGHT; IR  ANGIO VERTEBRAL SEL SUBCLAVIAN INNOMINATE UNI LEFT MOD SED; BILATERAL COMMON CAROTID AND INNOMINATE ANGIOGRAPHY COMPARISON:  MRI MRA of the brain of 01/14/2018. MEDICATIONS: Heparin 0 units IV; no antibiotic was administered within 1 hour of the procedure. ANESTHESIA/SEDATION: Versed 0.5 mg IV; Fentanyl 37.5 mcg IV. Moderate Sedation Time:  75 minutes. The patient was continuously monitored during the procedure by the interventional radiology nurse under my direct supervision. CONTRAST:  Isovue 300 approximately 60 mL. FLUOROSCOPY TIME:  Fluoroscopy Time: 8 minutes 42 seconds (822 mGy). COMPLICATIONS: None immediate. TECHNIQUE: Informed written consent was obtained from the patient after a thorough discussion of the procedural risks, benefits and alternatives. All questions were addressed. Maximal Sterile Barrier Technique was utilized including caps, mask, sterile gowns, sterile gloves, sterile drape, hand hygiene and skin antiseptic. A timeout was performed prior to the initiation of the procedure. The right antecubital fossa was prepped and draped in the usual sterile fashion. Thereafter using modified Seldinger technique, transbrachial access into the right brachial artery was obtained without difficulty. Over a 0.035 inch guidewire, a 5 French Pinnacle sheath was inserted. Through this, and also over 0.035 inch guidewire, a 5 French Simmons 2 catheter was advanced to the aortic arch region and formed without difficulty. It was selectively positioned in the right common carotid artery, the right vertebral artery, the left common carotid artery and the left subclavian artery. FINDINGS: The right vertebral artery origin is widely patent. The vessel is seen to opacify normally to the cranial skull base. Wide patency is seen of the right vertebrobasilar junction and the right posterior-inferior cerebellar artery. The opacified portion of the basilar artery, the posterior cerebral arteries, the superior cerebellar  arteries and the anterior-inferior cerebellar arteries appear normal into the capillary and venous phases. Non-opacified blood is seen in the basilar artery from contralateral vertebral artery. The left common carotid arteriogram demonstrates the left external carotid artery and its major branches to be widely patent. The left internal carotid artery at the bulb to the cranial skull base is widely patent. There is mild tortuosity in the proximal 1/3 of the left internal carotid artery. The petrous, cavernous and the supraclinoid segments are widely patent. The left middle cerebral artery and the left anterior cerebral artery opacify normally into the capillary and venous phases. The venous phase demonstrates retrograde opacification of the left transverse sinus in its proximal 2/3 primarily from the ipsilateral vein of Labbe. The right common carotid arteriogram demonstrates the right external carotid artery and its major branches to be widely patent. The right internal carotid artery at the bulb to the cranial skull base is widely patent. There is a double U shaped tortuosity of the middle 1/3 of the right internal carotid artery without evidence of kinking. More  distally, the right internal carotid artery is seen to opacify normally to the cranial skull base. The distal petrous, and the proximal cavernous segments demonstrate mild fusiform dilatation. Distal to this the supraclinoid right ICA is normally patent. The right middle cerebral artery and the right anterior cerebral artery demonstrate normal opacification into the capillary and venous phases. The right transverse sinus consistently demonstrates focal areas of moderate irregularity with questionable stenosis at its distal aspect. The left vertebral artery origin demonstrates mild stenosis. The vessel is seen to opacify normally to the cranial skull base. Wide patency is seen of the left vertebrobasilar junction and the left posterior-inferior cerebellar  artery. The opacified portion of the distal left vertebrobasilar junction, the basilar artery, the posterior cerebral arteries, the superior cerebellar arteries and the anterior-cerebellar arteries is grossly patent into the delayed arterial phase. IMPRESSION: Angiographically no evidence of intracranial aneurysms, arteriovenous malformations, dural AV fistula, dissections, or of intraluminal filling defects. Venous outflow demonstrates areas of caliber irregularity involving the right transverse sinus with suspicion of stenosis. However, patient motion precludes fine detail in this region. PLAN: As per referring physician. Electronically Signed   By: Julieanne Cotton M.D.   On: 01/18/2018 13:10   Ir Angio Vertebral Sel Subclavian Innominate Uni L Mod Sed  Result Date: 01/19/2018 CLINICAL DATA:  Focal right frontal hemorrhage. 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 EXAM: IR ANGIO VERTEBRAL SEL VERTEBRAL UNI RIGHT MOD SED; IR ULTRASOUND GUIDANCE VASC ACCESS RIGHT; IR ANGIO VERTEBRAL SEL SUBCLAVIAN INNOMINATE UNI LEFT MOD SED; BILATERAL COMMON CAROTID AND INNOMINATE ANGIOGRAPHY COMPARISON:  MRI MRA of the brain of 01/14/2018. MEDICATIONS: Heparin 0 units IV; no antibiotic was administered within 1 hour of the procedure. ANESTHESIA/SEDATION: Versed 0.5 mg IV; Fentanyl 37.5 mcg IV. Moderate Sedation Time:  75 minutes. The patient was continuously monitored during the procedure by the interventional radiology nurse under my direct supervision. CONTRAST:  Isovue 300 approximately 60 mL. FLUOROSCOPY TIME:  Fluoroscopy Time: 8 minutes 42 seconds (822 mGy). COMPLICATIONS: None immediate. TECHNIQUE: Informed written consent was obtained from the patient after a thorough discussion of the procedural risks, benefits and alternatives. All questions were addressed. Maximal Sterile Barrier Technique was utilized including caps, mask, sterile gowns, sterile gloves, sterile drape, hand hygiene and  skin antiseptic. A timeout was performed prior to the initiation of the procedure. The right antecubital fossa was prepped and draped in the usual sterile fashion. Thereafter using modified Seldinger technique, transbrachial access into the right brachial artery was obtained without difficulty. Over a 0.035 inch guidewire, a 5 French Pinnacle sheath was inserted. Through this, and also over 0.035 inch guidewire, a 5 French Simmons 2 catheter was advanced to the aortic arch region and formed without difficulty. It was selectively positioned in the right common carotid artery, the right vertebral artery, the left common carotid artery and the left subclavian artery. FINDINGS: The right vertebral artery origin is widely patent. The vessel is seen to opacify normally to the cranial skull base. Wide patency is seen of the right vertebrobasilar junction and the right posterior-inferior cerebellar artery. The opacified portion of the basilar artery, the posterior cerebral arteries, the superior cerebellar arteries and the anterior-inferior cerebellar arteries appear normal into the capillary and venous phases. Non-opacified blood is seen in the basilar artery from contralateral vertebral artery. The left common carotid arteriogram demonstrates the left external carotid artery and its major branches to be widely patent. The left internal carotid artery at the bulb to the cranial  skull base is widely patent. There is mild tortuosity in the proximal 1/3 of the left internal carotid artery. The petrous, cavernous and the supraclinoid segments are widely patent. The left middle cerebral artery and the left anterior cerebral artery opacify normally into the capillary and venous phases. The venous phase demonstrates retrograde opacification of the left transverse sinus in its proximal 2/3 primarily from the ipsilateral vein of Labbe. The right common carotid arteriogram demonstrates the right external carotid artery and its major  branches to be widely patent. The right internal carotid artery at the bulb to the cranial skull base is widely patent. There is a double U shaped tortuosity of the middle 1/3 of the right internal carotid artery without evidence of kinking. More distally, the right internal carotid artery is seen to opacify normally to the cranial skull base. The distal petrous, and the proximal cavernous segments demonstrate mild fusiform dilatation. Distal to this the supraclinoid right ICA is normally patent. The right middle cerebral artery and the right anterior cerebral artery demonstrate normal opacification into the capillary and venous phases. The right transverse sinus consistently demonstrates focal areas of moderate irregularity with questionable stenosis at its distal aspect. The left vertebral artery origin demonstrates mild stenosis. The vessel is seen to opacify normally to the cranial skull base. Wide patency is seen of the left vertebrobasilar junction and the left posterior-inferior cerebellar artery. The opacified portion of the distal left vertebrobasilar junction, the basilar artery, the posterior cerebral arteries, the superior cerebellar arteries and the anterior-cerebellar arteries is grossly patent into the delayed arterial phase. IMPRESSION: Angiographically no evidence of intracranial aneurysms, arteriovenous malformations, dural AV fistula, dissections, or of intraluminal filling defects. Venous outflow demonstrates areas of caliber irregularity involving the right transverse sinus with suspicion of stenosis. However, patient motion precludes fine detail in this region. PLAN: As per referring physician. Electronically Signed   By: Julieanne Cotton M.D.   On: 01/18/2018 13:10   Ir Angio Vertebral Sel Vertebral Uni R Mod Sed  Result Date: 01/19/2018 CLINICAL DATA:  Focal right frontal hemorrhage. 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 EXAM: IR ANGIO VERTEBRAL SEL VERTEBRAL  UNI RIGHT MOD SED; IR ULTRASOUND GUIDANCE VASC ACCESS RIGHT; IR ANGIO VERTEBRAL SEL SUBCLAVIAN INNOMINATE UNI LEFT MOD SED; BILATERAL COMMON CAROTID AND INNOMINATE ANGIOGRAPHY COMPARISON:  MRI MRA of the brain of 01/14/2018. MEDICATIONS: Heparin 0 units IV; no antibiotic was administered within 1 hour of the procedure. ANESTHESIA/SEDATION: Versed 0.5 mg IV; Fentanyl 37.5 mcg IV. Moderate Sedation Time:  75 minutes. The patient was continuously monitored during the procedure by the interventional radiology nurse under my direct supervision. CONTRAST:  Isovue 300 approximately 60 mL. FLUOROSCOPY TIME:  Fluoroscopy Time: 8 minutes 42 seconds (822 mGy). COMPLICATIONS: None immediate. TECHNIQUE: Informed written consent was obtained from the patient after a thorough discussion of the procedural risks, benefits and alternatives. All questions were addressed. Maximal Sterile Barrier Technique was utilized including caps, mask, sterile gowns, sterile gloves, sterile drape, hand hygiene and skin antiseptic. A timeout was performed prior to the initiation of the procedure. The right antecubital fossa was prepped and draped in the usual sterile fashion. Thereafter using modified Seldinger technique, transbrachial access into the right brachial artery was obtained without difficulty. Over a 0.035 inch guidewire, a 5 French Pinnacle sheath was inserted. Through this, and also over 0.035 inch guidewire, a 5 French Simmons 2 catheter was advanced to the aortic arch region and formed without difficulty. It was selectively positioned  in the right common carotid artery, the right vertebral artery, the left common carotid artery and the left subclavian artery. FINDINGS: The right vertebral artery origin is widely patent. The vessel is seen to opacify normally to the cranial skull base. Wide patency is seen of the right vertebrobasilar junction and the right posterior-inferior cerebellar artery. The opacified portion of the basilar  artery, the posterior cerebral arteries, the superior cerebellar arteries and the anterior-inferior cerebellar arteries appear normal into the capillary and venous phases. Non-opacified blood is seen in the basilar artery from contralateral vertebral artery. The left common carotid arteriogram demonstrates the left external carotid artery and its major branches to be widely patent. The left internal carotid artery at the bulb to the cranial skull base is widely patent. There is mild tortuosity in the proximal 1/3 of the left internal carotid artery. The petrous, cavernous and the supraclinoid segments are widely patent. The left middle cerebral artery and the left anterior cerebral artery opacify normally into the capillary and venous phases. The venous phase demonstrates retrograde opacification of the left transverse sinus in its proximal 2/3 primarily from the ipsilateral vein of Labbe. The right common carotid arteriogram demonstrates the right external carotid artery and its major branches to be widely patent. The right internal carotid artery at the bulb to the cranial skull base is widely patent. There is a double U shaped tortuosity of the middle 1/3 of the right internal carotid artery without evidence of kinking. More distally, the right internal carotid artery is seen to opacify normally to the cranial skull base. The distal petrous, and the proximal cavernous segments demonstrate mild fusiform dilatation. Distal to this the supraclinoid right ICA is normally patent. The right middle cerebral artery and the right anterior cerebral artery demonstrate normal opacification into the capillary and venous phases. The right transverse sinus consistently demonstrates focal areas of moderate irregularity with questionable stenosis at its distal aspect. The left vertebral artery origin demonstrates mild stenosis. The vessel is seen to opacify normally to the cranial skull base. Wide patency is seen of the left  vertebrobasilar junction and the left posterior-inferior cerebellar artery. The opacified portion of the distal left vertebrobasilar junction, the basilar artery, the posterior cerebral arteries, the superior cerebellar arteries and the anterior-cerebellar arteries is grossly patent into the delayed arterial phase. IMPRESSION: Angiographically no evidence of intracranial aneurysms, arteriovenous malformations, dural AV fistula, dissections, or of intraluminal filling defects. Venous outflow demonstrates areas of caliber irregularity involving the right transverse sinus with suspicion of stenosis. However, patient motion precludes fine detail in this region. PLAN: As per referring physician. Electronically Signed   By: Julieanne Cotton M.D.   On: 01/18/2018 13:10    Assessment and Plan:   Principal Problem: 1.  Endocarditis of mitral valve - agree w/ surgical consult but am not sure he is a candidate for OHS. - ABX per ID - TEE tomorrow  2. Chest pain - EF nl and no WMA on echo this admit - happened in the setting of significant physical stress on top of acute illness - MD review data and advise if any additional eval needed.  3. Chronic systolic CHF - 2nd mitral valve vegetation - no current volume overload by exam, need to follow closely  4. 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 Intraluminal density beyond  these areas concerning for thrombus. 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 Wall enhancement and perivascular stranding in this region suggests acute to subacute nature.The distal left common iliac and proximal small right femoral arteries fill via collaterals. 3.Large splenic infarct.  4.Multiple bilateral kidney  infarcts. 5.Circumferential bladder wall thickening concerning for cystitis. Ischemic or infectious etiologies are in the differential. 6.Small low to intermediate density left pleural effusion, which could reflect mildly complex effusion or a component of hemothorax. - he was seen by Vascular surgery while at [facility], from d/c summary:  "they recommended anticoagulation, and no surgical intervention. They also noted his prognosis was poor ("This is a fatal condition for which there is no surgical cure"), and recommended palliative care. " - he was on coumadin at d/c 05/10, on 05/15, INR was 1.57 - now not a candidate for anticoag 2nd hemorrhage seen on CT head 01/14/2018 - however, his aorta and other arteries may have improved>>recheck CT  - if no improvement in vascular system and cannot be anticoagulated, do not think he would be a candidate for surgical intervention on his valve.  Otherwise, per IM and consultants. - MRI planned but pt wants premed, has not been done yet. Active Problems:   Septic embolism (HCC)   Cerebral embolism with cerebral infarction   Aortic occlusion (HCC)   Nontraumatic cortical hemorrhage of right cerebral hemisphere [facility])   Chronic viral hepatitis B without delta-agent (HCC)   Chronic viral hepatitis C (HCC)   Back pain   For questions or updates, please contact [facility] Please consult [email or web address] for contact info under Cardiology/STEMI.   SignedTheodore Demark, PA-C  01/20/2018 5:02 PM

## 2018-01-20 NOTE — Progress Notes (Signed)
Patient pre-medicated with lorazepam  IVP and  transported to MRI via [place].

## 2018-01-20 NOTE — Care Management Note (Signed)
Case Management Note Donn Pierini RN, BSN Unit 4E-Case Manager [phone]  Patient Details  Name: Frederic Tones MRN: [id number] Date of Birth: Mar 24, 1976  Subjective/Objective:  Pt admitted with septic embolism, endocarditis, +vegetation. Hx IVDU                  Action/Plan: PTA Pt lived at home, will need CVTS consult for possible valve surgery- ID following for abx needs- CM and CSW to follow   Expected Discharge Date:                  Expected Discharge Plan:     In-House Referral:  Clinical Social Work  Discharge planning Services  CM Consult  Post Acute Care Choice:    Choice offered to:     DME Arranged:    DME Agency:     HH Arranged:    HH Agency:     Status of Service:  In process, will continue to follow  If discussed at Long Length of Stay Meetings, dates discussed:    Discharge Disposition:   Additional Comments:  Darrold Span, RN 01/20/2018, 4:17 PM

## 2018-01-20 NOTE — Progress Notes (Signed)
MRI reported numerous scanning attempts aborted due to persistent patient movement during testing.

## 2018-01-20 NOTE — Progress Notes (Signed)
Spoke to patients MD (AKULA, V).  We discussed the next step for completing patients MRI would need to be anesthesia if he was unable to hold still. MD states she will speak to pt tomorrow to see if he would like to do anesthesia.

## 2018-01-20 NOTE — Progress Notes (Signed)
[facility]  Date of Admission:  01/14/2018   Total days of antibiotics 7        Day 2 Penicillin          Patient ID: Marcus [name] is a 42 y.o. male with poly sub abuse, chronic Hep B, Hep C and in 12/29/17 diagnosed with strep pneumo endocarditis of the mitral valve at [facility]. Also noted to have subtotal occlusion of aorta @ IMA level with splenic/renal infarctions. Received PCN through 5/10 and transitioned to oral Levaquin 750 mg daily. Presented to [name] 5/16 with back pain and AMS. Found to have persistent vegetation of MV on TTE and bilateral MCA/watershed infarctions suspicious for septic emboli. Mycotic aneurysm r/o with cerebral angiography.   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 famotidine  20 mg Oral BID  . feeding supplement (ENSURE ENLIVE)  237 mL Oral TID PC & HS  . LORazepam  1-2 mg Intravenous Once  . senna-docusate  1 tablet Oral BID    SUBJECTIVE: Not able to get MRI done yesterday as it was not timed with anti-anxiety medication.    Allergies  Allergen Reactions  . Bee Venom Anaphylaxis  . Food Hives and Other (See Comments)    grapefruit  . Tylenol [Acetaminophen] Other (See Comments)    Pt states that he is not supposed to take this medication because of his Hep C.     OBJECTIVE: Vitals:   01/19/18 1034 01/19/18 1959 01/20/18 0309 01/20/18 0317  BP: 105/66 96/65  97/65  Pulse: 95 (!) 106  100  Resp: (!) 28 18    Temp: (!) 97.5 F (36.4 C) 98.8 F (37.1 C)  97.8 F (36.6 C)  TempSrc: Oral Oral  Oral  SpO2: 100% 100%  98%  Weight:   133 lb 12.8 oz (60.7 kg)   Height:       Body mass index is 18.66 kg/m.  Physical  Exam  Constitutional: He is oriented to person, place, and time. He appears well-developed and well-nourished.  Disheveled. Resting in bed, somewhat uncomfortable.   HENT:  Mouth/Throat: Oropharynx is clear and moist and mucous membranes are normal. Normal dentition. No dental abscesses.  Cardiovascular: Normal rate, regular rhythm and normal pulses.  Murmur heard.  Systolic murmur is present with a grade of 3/6. Pulmonary/Chest: Effort normal and breath sounds normal. No respiratory distress.  Abdominal: Soft. He exhibits no distension. There is no tenderness.  Lymphadenopathy:    He has no cervical adenopathy.  Neurological: He is alert and oriented to person, place, and time.  Skin: Skin is warm and dry. No rash noted.  Psychiatric: He has a normal mood and affect. Judgment normal.  Less anxious today.    Lab Results Lab Results  Component Value Date   WBC 10.7 (H) 01/19/2018   HGB 8.2 (L) 01/19/2018   HCT 27.8 (L) 01/19/2018   MCV 90.0 01/19/2018   PLT 522 (H) 01/19/2018    Lab Results  Component Value Date   CREATININE 0.68 01/19/2018   BUN 20 01/19/2018   NA 141 01/19/2018   K 4.2 01/19/2018   CL 105 01/19/2018  CO2 29 01/19/2018    Lab Results  Component Value Date   ALT 54 01/19/2018   AST 54 (H) 01/19/2018   ALKPHOS 72 01/19/2018   BILITOT 0.4 01/19/2018     Microbiology: BCx 5/16 >> NG   Assessment:  Marcus [name] is a 42 y.o. male with Hep B (not worked up), Hep C (1a, tx naive), IVDU (meth) and known streptococcal pneumoniae (PCN MIC 0.25) endocarditis of the mitral valve with secondary multifocal septic embolisms (spleen, kidney) and now with watershed strokes noted on brain MRI at 3 weeks of treatment. Systolic murmur 3/6 on exam over apex. Still with lower extremity weakness.   Continue penicillin (this was previously listed as an allergy for him however with recently tolerability I have removed it since it is no longer clinically relevant).   MRI  pending for LE weakness and pain -- hopefully will get done today.   Used suboxone in the past to help manage opioid addiction and is interested in establishing care for this here in local area. Previously was getting this at [name] however would like to stay here locally for ongoing ID care. Will discuss with Dr. Oswaldo Done about the possibility to start inpatient (was well maintained on 8 mg daily).   Appreciate CT surgery evaluation - discussed with [name] today regarding this. Welcomed and answered all questions he had.    Plan:  1. MRI of T/L-spine to be done 2. CT surgery evaluation  3. Continue Penicillin infusion    [name], MSN, NP-C [facility] for Infectious Disease  Medical Group Cell: [phone] Pager: [phone]  01/20/2018  12:49 PM

## 2018-01-20 NOTE — Progress Notes (Signed)
  Speech Language Pathology Treatment: Cognitive-Linquistic  Patient Details Name: Merlin Golden MRN: [id number] DOB: 05-Sep-1975 Today's Date: 01/20/2018 Time: 7062-3762 SLP Time Calculation (min) (ACUTE ONLY): 15 min  Assessment / Plan / Recommendation Clinical Impression  Pt recalls daily events with Min cues despite being distracted by internal factors (wanting to go outside, wanting a cigarette). SLP provided Min cues for selective attention throughout session, with pt reporting that his attention level is at baseline (h/o ADD). He verbalizes adequate safety awareness, although he also acknowledges that he wants to be able to do what he wants. Pt appears to be likely at his baseline level of function and he has no subjective complaints. Will s/o for now.   HPI HPI: Pt is a 42 y.o. 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 MRI showed multifocal subacute bilateral MCA/watershed territory infarcts.      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 CCC-SLP [phone]

## 2018-01-21 ENCOUNTER — Encounter (HOSPITAL_COMMUNITY)
Admission: EM | Disposition: A | Discharge status: Home / Self Care | Payer: Self-pay | Source: Home / Self Care | Attending: Internal Medicine

## 2018-01-21 ENCOUNTER — Inpatient Hospital Stay (HOSPITAL_COMMUNITY): Payer: Medicaid Other

## 2018-01-21 ENCOUNTER — Encounter (HOSPITAL_COMMUNITY): Payer: Self-pay | Admitting: Radiology

## 2018-01-21 DIAGNOSIS — I729 Aneurysm of unspecified site: Secondary | ICD-10-CM

## 2018-01-21 DIAGNOSIS — I7410 Embolism and thrombosis of unspecified parts of aorta: Secondary | ICD-10-CM

## 2018-01-21 DIAGNOSIS — I340 Nonrheumatic mitral (valve) insufficiency: Secondary | ICD-10-CM

## 2018-01-21 DIAGNOSIS — I058 Other rheumatic mitral valve diseases: Secondary | ICD-10-CM

## 2018-01-21 DIAGNOSIS — I63413 Cerebral infarction due to embolism of bilateral middle cerebral arteries: Secondary | ICD-10-CM

## 2018-01-21 LAB — BASIC METABOLIC PANEL
Anion gap: 10 (ref 5–15)
BUN: 23 mg/dL — ABNORMAL HIGH (ref 6–20)
CO2: 28 mmol/L (ref 22–32)
Calcium: 8.6 mg/dL — ABNORMAL LOW (ref 8.9–10.3)
Chloride: 101 mmol/L (ref 101–111)
Creatinine, Ser: 0.79 mg/dL (ref 0.61–1.24)
GFR calc Af Amer: >60 mL/min (ref >60)
GFR calc non Af Amer: >60 mL/min (ref >60)
Glucose, Bld: 136 mg/dL — ABNORMAL HIGH (ref 65–99)
Potassium: 4 mmol/L (ref 3.5–5.1)
Sodium: 139 mmol/L (ref 135–145)

## 2018-01-21 LAB — CBC
HCT: 30.2 % — ABNORMAL LOW (ref 39.0–52.0)
HEMOGLOBIN: 8.9 g/dL — AB (ref 13.0–17.0)
MCH: 26.6 pg (ref 26.0–34.0)
MCHC: 29.5 g/dL — AB (ref 30.0–36.0)
MCV: 90.4 fL (ref 78.0–100.0)
PLATELETS: 500 10*3/uL — AB (ref 150–400)
RBC: 3.34 MIL/uL — ABNORMAL LOW (ref 4.22–5.81)
RDW: 17 % — AB (ref 11.5–15.5)
WBC: 12.6 10*3/uL — ABNORMAL HIGH (ref 4.0–10.5)

## 2018-01-21 LAB — HEPATITIS B E ANTIGEN: HEP B E AG: POSITIVE — AB

## 2018-01-21 LAB — HEPATITIS B DNA, ULTRAQUANTITATIVE, PCR

## 2018-01-21 LAB — HBV REAL-TIME PCR, QUANT
HBV AS IU/ML: 806000000 IU/mL
LOG10 HBV AS IU/ML: 8.906 log10 IU/mL

## 2018-01-21 SURGERY — ECHOCARDIOGRAM, TRANSESOPHAGEAL
Anesthesia: Monitor Anesthesia Care

## 2018-01-21 MED ORDER — IOPAMIDOL (ISOVUE-370) INJECTION 76%
INTRAVENOUS | Status: AC
  Filled 2018-01-21: qty 100

## 2018-01-21 MED ORDER — FENTANYL CITRATE (PF) 100 MCG/2ML IJ SOLN: 25.0000 ug | INTRAMUSCULAR | Status: DC | PRN

## 2018-01-21 MED ORDER — TRAMADOL HCL 50 MG PO TABS
100.0000 mg | ORAL_TABLET | Freq: Four times a day (QID) | ORAL | Status: DC | PRN
  Administered 2018-01-21: 100 mg via ORAL
  Filled 2018-01-21: qty 2

## 2018-01-21 MED ORDER — BUPRENORPHINE HCL-NALOXONE HCL 8-2 MG SL SUBL
1.0000 | SUBLINGUAL_TABLET | Freq: Every day | SUBLINGUAL | Status: DC
  Administered 2018-01-21 – 2018-02-09 (×20): 1 via SUBLINGUAL
  Filled 2018-01-21 (×19): qty 1

## 2018-01-21 MED ORDER — MIDAZOLAM HCL 2 MG/2ML IJ SOLN: 1.0000 mg | INTRAMUSCULAR | Status: DC | PRN

## 2018-01-21 NOTE — Consult Note (Signed)
    I was asked by [name] with the ID service to see Mr. [name] regarding treatment of opioid use disorder. He is a 42 year old man with a 20 year history of injection drug use. He used heroin up until 2 years ago, he stopped because of frequent overdoses. He went to an inpatient rehab in [place] in November and was treated with Suboxone  daily. When he was discharged he was unable to make the daily follow up appointments to continue Suboxone, so he stopped treatment. Currently he denies using opioids, but does inject meth. He is admitted with a resulting mitral valve endocarditis due to strep pneumo as a complication of that injection drug use. He continues to report high burden of cravings. He says the suboxone was very helpful in treating those cravings, it even improved his cravings for meth. He wants to restart suboxone asap and is interested in continuing treatment here in [place] if it can be arranged.   I think he has a moderate opioid use disorder which he has been treating with only abstinence to this point. He still has high burden of cravings. He is also still at high risk for relapse and subsequent overdose or further infectious complications. I think he is a good candidate to restart Suboxone. I will order  daily sublingual, and I counseled him on the importance of good absorption under the tongue. He has no signs of withdrawal, seemingly no dependence for opioids, which is good.   It is ok to continue the suboxone through any future anesthesia events. I will dc the tramadol now, the suboxone will have mild analgesic properties. If you need to use full agonist opioids in the future, you will need to use more potent formulations, like IV dilaudid to overcome the competitive binding. I would also use benzos with caution while on suboxone to avoid oversedation.   I will follow up with him to see how he responds. Feel free to call me with any questions.   Erlinda Hong,  MD [phone]

## 2018-01-21 NOTE — Progress Notes (Signed)
[facility]  Date of Admission:  01/14/2018   Total days of antibiotics 7        Day 2 Penicillin          Patient ID: Marcus [name] is a 42 y.o. male with poly sub abuse, chronic Hep B, Hep C and in 12/29/17 diagnosed with strep pneumo endocarditis of the mitral valve at [facility]. Also noted to have subtotal occlusion of aorta @ IMA level with splenic/renal infarctions. Received PCN through 5/10 and transitioned to oral Levaquin 750 mg daily. Presented to [facility] 5/16 with back pain and AMS. Found to have persistent vegetation of MV on TTE and bilateral MCA/watershed infarctions suspicious for septic emboli. Mycotic aneurysm r/o with cerebral angiography.   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 buprenorphine-naloxone  1 tablet Sublingual Daily  . famotidine  20 mg Oral BID  . feeding supplement (ENSURE ENLIVE)  237 mL Oral TID PC & HS  . iopamidol      . senna-docusate  1 tablet Oral BID    SUBJECTIVE: Feeling better after dose of suboxone an would like to take a walk with me to see how his legs are feeling. Reports they are often very cold but this has been ongoing since prior to recent hospitalizations.    Allergies  Allergen Reactions  . Bee Venom Anaphylaxis  . Food Hives and Other (See Comments)    grapefruit  . Tylenol [Acetaminophen] Other (See Comments)    Pt states that he is not supposed to take this medication because of his Hep C.     OBJECTIVE: Vitals:   01/20/18 0317 01/20/18 1646 01/20/18 2053 01/21/18 0607  BP: 97/65 106/82 100/62 95/63  Pulse: 100  73   Resp:   18 18  Temp: 97.8 F  (36.6 C) 98.4 F (36.9 C) 98.7 F (37.1 C) 98.6 F (37 C)  TempSrc: Oral Oral Oral Oral  SpO2: 98%  99% 98%  Weight:    133 lb 3.2 oz (60.4 kg)  Height:       Body mass index is 18.58 kg/m.  Physical Exam  Constitutional: He is oriented to person, place, and time. He appears well-developed and well-nourished.  Disheveled. Resting in bed, more comfortable.   HENT:  Mouth/Throat: Oropharynx is clear and moist and mucous membranes are normal. Normal dentition. No dental abscesses.  Cardiovascular: Normal rate, regular rhythm and normal pulses.  Murmur heard.  Systolic murmur is present with a grade of 3/6. Pulmonary/Chest: Effort normal and breath sounds normal. No respiratory distress.  Abdominal: Soft. He exhibits no distension. There is no tenderness.  Lymphadenopathy:    He has no cervical adenopathy.  Neurological: He is alert and oriented to person, place, and time.  Skin: Skin is warm and dry. No rash noted.  Psychiatric: He has a normal mood and affect. Judgment normal.  Less anxious today.    Lab Results Lab Results  Component Value Date   WBC 12.6 (H) 01/21/2018   HGB 8.9 (L) 01/21/2018   HCT 30.2 (L) 01/21/2018   MCV 90.4 01/21/2018  PLT 500 (H) 01/21/2018    Lab Results  Component Value Date   CREATININE 0.79 01/21/2018   BUN 23 (H) 01/21/2018   NA 139 01/21/2018   K 4.0 01/21/2018   CL 101 01/21/2018   CO2 28 01/21/2018    Lab Results  Component Value Date   ALT 54 01/19/2018   AST 54 (H) 01/19/2018   ALKPHOS 72 01/19/2018   BILITOT 0.4 01/19/2018     Microbiology: BCx 5/16 >> NG   Assessment:  Marcus [name] is a 42 y.o. male with Hep B (not worked up), Hep C (1a, tx naive), IVDU (meth) and known streptococcal pneumoniae (PCN MIC 0.25) endocarditis of the mitral valve with secondary multifocal septic embolisms (spleen, kidney) and now with watershed strokes noted on brain MRI at 3 weeks of treatment. Systolic murmur 3/6 on exam over apex. Still  with lower extremity weakness and cool for me today with feet up on bed rail with no palpable pulses. CTA Aorta with aneurysm (mycotic 2/2 mitral vegetation vs. Vasculitis) and thrombus. Previously described to be "fatal condition for which there is no surgical cure" and recommended anticoagulation however now with ICH unable to maintain this. If he is truly palliative I am not certain he quite understands this. Agree with Dr. Leonides [name] note that likely this is more chronic due to collateral circulation and warm distal LE's. Clotting disorder?   TEE pending to follow up vegetation and valve Monday.   Appreciate Dr. Oswaldo [name] helping with starting him back on suboxone to help with cravings and some of his analgesic need.    Plan:  1. CT surgery evaluation pending  2. TEE Monday  3. Vascular surgery eval? 4. Continue Penicillin infusion - would anticipate longer duration of therapy beyond the planned 5/28 course.   Marcus Alberts, MSN, NP-C [facility] for Infectious Disease [facility] Cell: [phone] Pager: [phone]  01/21/2018  3:09 PM

## 2018-01-21 NOTE — Progress Notes (Signed)
PROGRESS NOTE    Marcus [name]  ZOX:[id number] DOB: 05/29/1976 DOA: 01/14/2018 PCP: [facility]   Brief Narrative: 41yo homeless man w/ a hx of polysubstance abuse, Hep B and Hep C, and depression/SI who was diagnosed with endocarditis in April 2019 and treated originally at [facility]. 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  He had been on anticoagualtion with coumadin. He presented to the [facility] ED w/ c/o back pain, and was found to be encephalopathic. He underwent TTE showing a 1.4 cm mobile vegetation on the posterior leaflet on the MV with severe regurgitation.     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  Blood cultures this admit negative so far.  Current only penicillin IV as per ID this admission.  TTE on admit  Cardiothoracic surgery evaluation for valve surgery.    Severe MR from the vegetations: CT angio of the abdomen with and without contrast showed  There is abrupt occlusion of the aorta below the level of the IMA as described on the prior report. Mixed density within the aorta suggest subacute age. Also, there is thickening and inflammatory change of the wall of the aorta suggesting vasculitis or mycotic aneurysm.There is also associated occlusion of the bilateral common iliac arteries.Focal occlusion of the splenic artery. TEE to be scheduled on monday , cardiology  consulted.     MCA infarcts due to septic emboli:  No further work up as per neurology.  MRI Brain: Multifocal subacute bilateral MCA/watershed territory infarcts and right frontal high convexity small hemorrhage. No anti coagulation as per neurology.  Cerebral angiogram on 5 /20 does not show any mycotic aneurysm.  Recommend outpatient follow up with stroke clinic NP at [facility] in about 4 weeks.    Polysubstance abuse:  Pt reports he was on Suboxone previously and requested to be restarted on it.  On 8 mg SL suboxone.    Homeless ness: CSW consulted.      DVT prophylaxis:scd's Code Status: full code.  Family Communication: none at bedside.  Disposition Plan: pending evaluation by cardiothoracic surgery   Consultants:   ID Dr Drue Second  Cardiothoracic surgery   Cardiology Dr Duke Salvia   Procedures: TTE on 5/16.   Antimicrobials: PENICILLIN G   Subjective: Requesting to be back on suboxone 8 mg daily.   Objective: Vitals:   01/20/18 0317 01/20/18 1646 01/20/18 2053 01/21/18 0607  BP: 97/65 106/82 100/62 95/63  Pulse: 100  73   Resp:   18 18  Temp: 97.8 F (36.6 C) 98.4 F (36.9 C) 98.7 F (37.1 C) 98.6 F (37 C)  TempSrc: Oral Oral Oral Oral  SpO2: 98%  99% 98%  Weight:    60.4 kg (133 lb 3.2 oz)  Height:       No intake or output data in the 24 hours ending 01/21/18 1358 Filed Weights   01/19/18 0245 01/20/18 0309 01/21/18  2130  Weight: 63.1 kg (139 lb 1.8 oz) 60.7 kg (133 lb 12.8 oz) 60.4 kg (133 lb 3.2 oz)    Examination:  General exam: Appears calm and comfortable not in distress.  Respiratory system: clear to auscultation, no wheezing or rhonchi.  Cardiovascular system: S1 & S2 heard, RRR. No JVD,  Systolic murmer present.  No pedal edema. Gastrointestinal system: Abdomen is soft mildly tender in the epigastric area, non distended.  Central nervous system: Alert and oriented. Non focal. Extremities: Symmetric 5 x 5 power. No cyanosis or clubbing.    Skin: No rashes, lesions or ulcers Psychiatry:  Mood & affect appropriate.     Data Reviewed: I have personally reviewed following labs and imaging studies  CBC: Recent Labs  Lab 01/16/18 0512 01/17/18 0355 01/18/18 0342 01/19/18 0339 01/21/18 1232  WBC 9.4 10.9* 10.4 10.7* 12.6*  HGB 8.0* 8.6* 8.1* 8.2* 8.9*  HCT 26.6* 28.7* 27.7* 27.8* 30.2*  MCV 87.2 88.9 88.5 90.0 90.4  PLT 432* 537* 524* 522* 500*   Basic Metabolic Panel: Recent Labs  Lab 01/15/18 0653 01/16/18 0512 01/17/18 0355 01/18/18 0342 01/19/18 0339 01/21/18 1232  NA 139 139 140 139 141 139  K 3.6 3.6 3.9 4.2 4.2 4.0  CL 108 107 106 104 105 101  CO2 GLUCOSE 99 102* 103* 94 97 136*  BUN 5* 5* 23*  CREATININE 0.74 0.73 0.83 0.72 0.68 0.79  CALCIUM 8.3* 7.9* 8.3* 8.6* 8.4* 8.6*  MG 1.8 2.0 1.8  --   --   --   PHOS 3.6 3.2 2.9  --   --   --    GFR: Estimated Creatinine Clearance: 104.9 mL/min (by C-G formula based on SCr of 0.79 mg/dL). Liver Function Tests: Recent Labs  Lab 01/17/18 0355 01/18/18 0342 01/19/18 0339  AST 33 43* 54*  ALT 34 42 54  ALKPHOS 61 59 72  BILITOT 0.5 0.4 0.4  PROT 6.8 7.1 6.7  ALBUMIN 1.8* 1.9* 1.8*   No results for input(s): LIPASE, AMYLASE in the last 168 hours. No results for input(s): AMMONIA in the last 168 hours. Coagulation Profile: Recent Labs  Lab 01/15/18 0653 01/17/18 1200 01/18/18 0342  INR 1.98 1.18 1.18   Cardiac Enzymes: No results for input(s): CKTOTAL, CKMB, CKMBINDEX, TROPONINI in the last 168 hours. BNP (last 3 results) No results for input(s): PROBNP in the last 8760 hours. HbA1C: No results for input(s): HGBA1C in the last 72 hours. CBG: No results for input(s): GLUCAP in the last 168 hours. Lipid Profile: No results for input(s): CHOL, HDL, LDLCALC, TRIG, CHOLHDL, LDLDIRECT in the last 72 hours. Thyroid Function Tests: No results for input(s): TSH, T4TOTAL, FREET4, T3FREE, THYROIDAB in the last 72  hours. Anemia Panel: No results for input(s): VITAMINB12, FOLATE, FERRITIN, TIBC, IRON, RETICCTPCT in the last 72 hours. Sepsis Labs: No results for input(s): PROCALCITON, LATICACIDVEN in the last 168 hours.  Recent Results (from the past 240 hour(s))  Blood culture (routine x 2)     Status: None   Collection Time: 01/14/18 10:29 AM  Result Value Ref Range Status   Specimen Description BLOOD RIGHT ANTECUBITAL  Final   Special Requests   Final    BOTTLES DRAWN AEROBIC AND ANAEROBIC Blood Culture adequate volume   Culture   Final    NO GROWTH 5 DAYS Performed at [facility], [address], [place], [place] [address]    Report Status 01/19/2018  FINAL  Final  Blood culture (routine x 2)     Status: None   Collection Time: 01/14/18 11:05 AM  Result Value Ref Range Status   Specimen Description BLOOD RIGHT ANTECUBITAL  Final   Special Requests   Final    BOTTLES DRAWN AEROBIC AND ANAEROBIC Blood Culture adequate volume   Culture   Final    NO GROWTH 5 DAYS Performed at [facility], [address], [place], [place] [address]    Report Status 01/19/2018 FINAL  Final  MRSA PCR Screening     Status: Abnormal   Collection Time: 01/14/18  3:46 PM  Result Value Ref Range Status   MRSA by PCR POSITIVE (A) NEGATIVE Final    Comment:        The GeneXpert MRSA Assay (FDA approved for NASAL specimens only), is one component of a comprehensive MRSA colonization surveillance program. It is not intended to diagnose MRSA infection nor to guide or monitor treatment for MRSA infections. RESULT CALLED TO, READ BACK BY AND VERIFIED WITH: R.PATERNOSTER,RN AT 1804 BY L.PITT 01/14/18          Radiology Studies: Ct Angio Abdomen W &/or Wo Contrast  Result Date: 01/21/2018 CLINICAL DATA:  Aorta occlusion EXAM: CT ANGIOGRAPHY ABDOMEN TECHNIQUE: Multidetector CT imaging of the abdomen was performed using the standard protocol during bolus administration of intravenous contrast.  Multiplanar reconstructed images and MIPs were obtained and reviewed to evaluate the vascular anatomy. CONTRAST:  ISOVUE-370 IOPAMIDOL (ISOVUE-370) INJECTION 76% COMPARISON:  12/29/2017 at [facility], report only FINDINGS: VASCULAR Aorta: The aorta is abruptly occluded just below the takeoff of the IMA. The borders of the abdominal aorta are poorly defined at the level of the occlusion. Mild aneurysmal dilatation of the distal aorta measuring up to 3.7 cm is suspected. At the level of the occlusion, there is mixed low-density and isodensity within the thrombus, as well as a lack of atherosclerotic calcification, suggesting subacute rather than acute age. There is also a mild thickening of the wall of the aorta with stranding in the adjacent fat. These findings suggest an inflammatory process. Celiac: Patent and ectatic. There is abrupt occlusion of the splenic artery, 2 cm from the hilum of the spleen. Distal branches reconstitute. The hepatic artery and its branches are grossly patent. Gastroduodenal artery is patent. SMA: Patent and ectatic. Renals: 2 right renal arteries and a single left renal artery are patent. IMA: Patent. Inflow: Bilateral common iliac arteries are occluded. There is some filling of the mid and distal left common iliac artery with contrast flow. The left common iliac artery measures 1.9 cm in maximal caliber. Right common iliac artery measures 3.6 cm in caliber. The left internal and external iliac arteries reconstitute and are partially imaged. Veins: No evidence of DVT. Review of the MIP images confirms the above findings. NON-VASCULAR Lower chest: No acute abnormality. Hepatobiliary: Unremarkable Pancreas: Unremarkable Spleen: There is a large nonspecific there is a complex cystic lesion in the spleen measuring up to 11.6 cm. Adrenals/Urinary Tract: There is scarring in the lower pole of the left kidney associated with wedge-shaped areas of low density. These  findings likely represent small infarcts. This is also present in the upper pole of the right kidney to a lesser degree. Stomach/Bowel: No obvious mass in the colon. No evidence of small-bowel obstruction. Lymphatic: Small para-aortic lymph nodes are present. Other: No free fluid.  Postoperative changes from appendectomy. Musculoskeletal: No vertebral compression deformity. Bilateral L5 pars  defects. No evidence of anterolisthesis. IMPRESSION: VASCULAR There is abrupt occlusion of the aorta below the level of the IMA as described on the prior report. Mixed density within the aorta suggest subacute age. Also, there is thickening and inflammatory change of the wall of the aorta suggesting vasculitis or mycotic aneurysm. There is also associated occlusion of the bilateral common iliac arteries. Focal occlusion of the splenic artery. NON-VASCULAR Complex cystic lesion in the spleen is nonspecific. Small infarcts in scarring in the kidneys bilaterally. Electronically Signed   By: Jolaine Click M.D.   On: 01/21/2018 12:05        Scheduled Meds: . buprenorphine-naloxone  1 tablet Sublingual Daily  . famotidine  20 mg Oral BID  . feeding supplement (ENSURE ENLIVE)  237 mL Oral TID PC & HS  . iopamidol      . senna-docusate  1 tablet Oral BID   Continuous Infusions: . pencillin G potassium IV 4 Million Units (01/21/18 1210)     LOS: 7 days    Time spent: 35 minutes    Kathlen Mody, MD [facility] Pager [phone]   If 7PM-7AM, please contact night-coverage [email or web address] Password [email or web address] 01/21/2018, 1:58 PM

## 2018-01-22 DIAGNOSIS — I340 Nonrheumatic mitral (valve) insufficiency: Secondary | ICD-10-CM

## 2018-01-22 DIAGNOSIS — I339 Acute and subacute endocarditis, unspecified: Secondary | ICD-10-CM

## 2018-01-22 DIAGNOSIS — I714 Abdominal aortic aneurysm, without rupture: Secondary | ICD-10-CM

## 2018-01-22 DIAGNOSIS — F1120 Opioid dependence, uncomplicated: Secondary | ICD-10-CM

## 2018-01-22 LAB — HCV RNA QUANT RFLX ULTRA OR GENOTYP
HCV RNA Qnt(log copy/mL): 4.19 log10 IU/mL
HepC Qn: 15500 IU/mL

## 2018-01-22 LAB — HEPATITIS C GENOTYPE
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
## 2018-01-22 NOTE — Progress Notes (Signed)
PROGRESS NOTE    Marcus [name]  ZOX:[id number] DOB: [birth year] DOA: 01/14/2018 PCP: [facility]   Brief Narrative: 41yo homeless man w/ a hx of polysubstance abuse, Hep B and Hep C, and depression/SI who was diagnosed with endocarditis in April 2019 and treated originally at [facility]. 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  He had been on anticoagualtion with coumadin. He presented to the [facility] ED w/ c/o back pain, and was found to be encephalopathic. He underwent TTE showing a 1.4 cm mobile vegetation on the posterior leaflet on the MV with severe regurgitation.     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  Blood cultures this admit negative so far.  Current only penicillin IV as per ID this admission.  TTE on admit and he is tentatively scheduled for TEE on Monday.  Cardiothoracic surgery evaluation for valve surgery.    Severe MR from the vegetations: CT angio of the abdomen with and without contrast showed  There is abrupt occlusion of the aorta below the level of the IMA as described on the prior report. Mixed density within the aorta suggest subacute age. Also, there is thickening and inflammatory change of the wall of the aorta suggesting vasculitis or mycotic aneurysm.There is also associated occlusion of the bilateral common  iliac arteries.Focal occlusion of the splenic artery. VASCULAR surgery suggested pt not a candidate for surgery.      MCA infarcts due to septic emboli:  No further work up as per neurology.  MRI Brain: Multifocal subacute bilateral MCA/watershed territory infarcts and right frontal high convexity small hemorrhage. No anti coagulation as per neurology.  Cerebral angiogram on 5 /20 does not show any mycotic aneurysm.  Recommend outpatient follow up with stroke clinic NP at [facility] in about 4 weeks.  If anti coagulation is needed , we need to obtain a repeat CT head without contrast to rule out worsening ICH. If the ICH is stable ,then he can be started on IV heparin as per Dr Roda Shutters with neurology.    Polysubstance abuse:  Pt reports he was on Suboxone previously and requested to be restarted on it.  On 8 mg SL suboxone.    Homeless ness: CSW consulted.    Anemia of  chronic disease Hemoglobin stable around 8.    Thrombocytosis suspect from the infection.  Platelets stable around 500,000   Leukocytosis:  Pt remains afebrile. Blood cultures negative so far.   DVT prophylaxis: scd's Code Status: full code.  Family Communication: none at bedside.  Disposition Plan: pending evaluation by cardiothoracic surgery   Consultants:   ID Dr Drue Second  Cardiothoracic surgery   Cardiology Dr Duke Salvia   Procedures: TTE on 5/16.   Antimicrobials: PENICILLIN G   Subjective: No new complaints today.   Objective: Vitals:   01/20/18 2053 01/21/18 8119 01/21/18 1933  01/22/18 0409  BP: 100/62 95/63 95/61  (!) 85/54  Pulse: 73     Resp: Temp: 98.7 F (37.1 C) 98.6 F (37 C) 98 F (36.7 C) 98.3 F (36.8 C)  TempSrc: Oral Oral Oral Oral  SpO2: 99% 98%    Weight:  60.4 kg (133 lb 3.2 oz)    Height:       No intake or output data in the 24 hours ending 01/22/18 1151 Filed Weights   01/19/18 0245 01/20/18 0309 01/21/18 0607  Weight: 63.1 kg (139 lb 1.8 oz) 60.7 kg (133  lb 12.8 oz) 60.4 kg (133 lb 3.2 oz)    Examination:  General exam: Appears calm not in distress.  Respiratory system: air entry fair, no wheezing or rhonchi.  Cardiovascular system: S1 & S2 heard, RRR. No JVD,  Systolic murmer present.  No pedal edema. Gastrointestinal system: Abdomen is soft mildly tender generalized today. No distention. Bowel sounds heard.  Central nervous system: Alert and oriented. Non focal. Extremities: Symmetric 5 x 5 power. No cyanosis or clubbing.  Skin: No rashes, lesions or ulcers Psychiatry:  Affect is flat.     Data Reviewed: I have personally reviewed following labs and imaging studies  CBC: Recent Labs  Lab 01/16/18 0512 01/17/18 0355 01/18/18 0342 01/19/18 0339 01/21/18 1232  WBC 9.4 10.9* 10.4 10.7* 12.6*  HGB 8.0* 8.6* 8.1* 8.2* 8.9*  HCT 26.6* 28.7* 27.7* 27.8* 30.2*  MCV 87.2 88.9 88.5 90.0 90.4  PLT 432* 537* 524* 522* 500*   Basic Metabolic Panel: Recent Labs  Lab 01/16/18 0512 01/17/18 0355 01/18/18 0342 01/19/18 0339 01/21/18 1232  NA 139 140 139 141 139  K 3.6 3.9 4.2 4.2 4.0  CL 107 106 104 105 101  CO2 GLUCOSE 102* 103* 94 97 136*  BUN 5* 23*  CREATININE 0.73 0.83 0.72 0.68 0.79  CALCIUM 7.9* 8.3* 8.6* 8.4* 8.6*  MG 2.0 1.8  --   --   --   PHOS 3.2 2.9  --   --   --    GFR: Estimated Creatinine Clearance: 104.9 mL/min (by C-G formula based on SCr of 0.79 mg/dL). Liver Function Tests: Recent Labs  Lab 01/17/18 0355 01/18/18 0342 01/19/18 0339  AST 33 43* 54*  ALT 34 42 54  ALKPHOS 61 59 72  BILITOT 0.5 0.4 0.4  PROT 6.8 7.1 6.7  ALBUMIN 1.8* 1.9* 1.8*   No results for input(s): LIPASE, AMYLASE in the last 168 hours. No results for input(s): AMMONIA in the last 168 hours. Coagulation Profile: Recent Labs  Lab 01/17/18 1200 01/18/18 0342  INR 1.18 1.18   Cardiac Enzymes: No results for input(s): CKTOTAL, CKMB, CKMBINDEX, TROPONINI in the last 168 hours. BNP (last 3  results) No results for input(s): PROBNP in the last 8760 hours. HbA1C: No results for input(s): HGBA1C in the last 72 hours. CBG: No results for input(s): GLUCAP in the last 168 hours. Lipid Profile: No results for input(s): CHOL, HDL, LDLCALC, TRIG, CHOLHDL, LDLDIRECT in the last 72 hours. Thyroid Function Tests: No results for input(s): TSH, T4TOTAL, FREET4, T3FREE, THYROIDAB in the last 72 hours. Anemia Panel: No results for input(s): VITAMINB12, FOLATE, FERRITIN, TIBC, IRON, RETICCTPCT in the last 72 hours. Sepsis Labs: No results for input(s): PROCALCITON, LATICACIDVEN in the last 168 hours.  Recent Results (from the past 240 hour(s))  Blood culture (routine x 2)     Status: None  Collection Time: 01/14/18 10:29 AM  Result Value Ref Range Status   Specimen Description BLOOD RIGHT ANTECUBITAL  Final   Special Requests   Final    BOTTLES DRAWN AEROBIC AND ANAEROBIC Blood Culture adequate volume   Culture   Final    NO GROWTH 5 DAYS Performed at [facility], [address] [place], [place] [address]    Report Status 01/19/2018 FINAL  Final  Blood culture (routine x 2)     Status: None   Collection Time: 01/14/18 11:05 AM  Result Value Ref Range Status   Specimen Description BLOOD RIGHT ANTECUBITAL  Final   Special Requests   Final    BOTTLES DRAWN AEROBIC AND ANAEROBIC Blood Culture adequate volume   Culture   Final    NO GROWTH 5 DAYS Performed at [facility], [address], [place], [place] [address]    Report Status 01/19/2018 FINAL  Final  MRSA PCR Screening     Status: Abnormal   Collection Time: 01/14/18  3:46 PM  Result Value Ref Range Status   MRSA by PCR POSITIVE (A) NEGATIVE Final    Comment:        The GeneXpert MRSA Assay (FDA approved for NASAL specimens only), is one component of a comprehensive MRSA colonization surveillance program. It is not intended to diagnose MRSA infection nor to guide or monitor treatment for MRSA  infections. RESULT CALLED TO, READ BACK BY AND VERIFIED WITH: R.PATERNOSTER,RN AT 1804 BY L.PITT 01/14/18          Radiology Studies: Ct Angio Abdomen W &/or Wo Contrast  Result Date: 01/21/2018 CLINICAL DATA:  Aorta occlusion EXAM: CT ANGIOGRAPHY ABDOMEN TECHNIQUE: Multidetector CT imaging of the abdomen was performed using the standard protocol during bolus administration of intravenous contrast. Multiplanar reconstructed images and MIPs were obtained and reviewed to evaluate the vascular anatomy. CONTRAST:  ISOVUE-370 IOPAMIDOL (ISOVUE-370) INJECTION 76% COMPARISON:  12/29/2017 at [facility], report only FINDINGS: VASCULAR Aorta: The aorta is abruptly occluded just below the takeoff of the IMA. The borders of the abdominal aorta are poorly defined at the level of the occlusion. Mild aneurysmal dilatation of the distal aorta measuring up to 3.7 cm is suspected. At the level of the occlusion, there is mixed low-density and isodensity within the thrombus, as well as a lack of atherosclerotic calcification, suggesting subacute rather than acute age. There is also a mild thickening of the wall of the aorta with stranding in the adjacent fat. These findings suggest an inflammatory process. Celiac: Patent and ectatic. There is abrupt occlusion of the splenic artery, 2 cm from the hilum of the spleen. Distal branches reconstitute. The hepatic artery and its branches are grossly patent. Gastroduodenal artery is patent. SMA: Patent and ectatic. Renals: 2 right renal arteries and a single left renal artery are patent. IMA: Patent. Inflow: Bilateral common iliac arteries are occluded. There is some filling of the mid and distal left common iliac artery with contrast flow. The left common iliac artery measures 1.9 cm in maximal caliber. Right common iliac artery measures 3.6 cm in caliber. The left internal and external iliac arteries reconstitute and are partially imaged. Veins: No  evidence of DVT. Review of the MIP images confirms the above findings. NON-VASCULAR Lower chest: No acute abnormality. Hepatobiliary: Unremarkable Pancreas: Unremarkable Spleen: There is a large nonspecific there is a complex cystic lesion in the spleen measuring up to 11.6 cm. Adrenals/Urinary Tract: There is scarring in the  lower pole of the left kidney associated with wedge-shaped areas of low density. These findings likely represent small infarcts. This is also present in the upper pole of the right kidney to a lesser degree. Stomach/Bowel: No obvious mass in the colon. No evidence of small-bowel obstruction. Lymphatic: Small para-aortic lymph nodes are present. Other: No free fluid.  Postoperative changes from appendectomy. Musculoskeletal: No vertebral compression deformity. Bilateral L5 pars defects. No evidence of anterolisthesis. IMPRESSION: VASCULAR There is abrupt occlusion of the aorta below the level of the IMA as described on the prior report. Mixed density within the aorta suggest subacute age. Also, there is thickening and inflammatory change of the wall of the aorta suggesting vasculitis or mycotic aneurysm. There is also associated occlusion of the bilateral common iliac arteries. Focal occlusion of the splenic artery. NON-VASCULAR Complex cystic lesion in the spleen is nonspecific. Small infarcts in scarring in the kidneys bilaterally. Electronically Signed   By: Jolaine Click M.D.   On: 01/21/2018 12:05        Scheduled Meds: . buprenorphine-naloxone  1 tablet Sublingual Daily  . famotidine  20 mg Oral BID  . feeding supplement (ENSURE ENLIVE)  237 mL Oral TID PC & HS  . senna-docusate  1 tablet Oral BID   Continuous Infusions: . pencillin G potassium IV 4 Million Units (01/22/18 0858)     LOS: 8 days    Time spent: 35 minutes    Kathlen Mody, MD [facility] Pager [phone]   If 7PM-7AM, please contact night-coverage [email or web address] Password [email or web address] 01/22/2018,  11:51 AM

## 2018-01-22 NOTE — Consult Note (Addendum)
[address]       [place], [address]             [phone]        Marcus [name] [place] Medical Record #[id number] Date of Birth: [birth year]  Referring: Janene Madeira, NP infectious disease Primary Care: [facility] Primary Cardiologist:No primary care provider on file.  Chief Complaint:    Chief Complaint  Patient presents with  . 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    He is on Penicillin G for Strep Pneumo on this admission. Prior to this, however, he was treated at [facility]. He was admitted to [facility] 4/30 - 01/08/18 for strep pneumoniae endocarditis with secondary embolizations. He apparently was seen by CT surgery and there were no surgical indications at that time.  Infectious disease at [facility] recommended 4 weeks of IV therapy. He received PCN until 5/10 when he was transitioned to oral levofloxacin 718m QD.  Treatment duration was through May 28th; however, he left AMA. Also, of note, there was a subtotal occlusion of the aorta and [facility] After [facility], he did not take his Coumadin regularly nor have his PT/INR checked. His INR on this admission was 1.57. Patient presented with altered mental status, weakness, and dizziness. CT of the head showed septic emboli,  with acute embolic related hemorrhage in the right superior frontal lobe. MRI showed multifocal subacute bilateral MCA/watershed territory infarcts and old small RIGHT cerebellar infarcts. MRA showed subacute thromboembolism resulting in RIGHT M2 occlusion, given aneurysmal appearance this is concerning for developing mycotic aneurysm. Neurology was consulted. Per the neurologist, no anticoagulation for possible area of small bleed and fear of mycotic aneurysms which might rupture and lead to large bleeds. Echo done on 05/16 showed there was a medium-sized, 1.4 cm (L) x 0.4 cm (W), mobile vegetation on the posterior leaflet. There was severe regurgitation directed posteriorly. Infectious disease was consulted and patient was started on Penicillin G. Also, patient was restarted on Sub[place]one ([place] to treat drug cravings and patient is willing to continue treatment if it can be arranged. Cardiology and vascular surgery have already been consulted to assist in the management of this patient. Per Dr. CBridgett Larsson [facility] Dr. ORoxy Mannshas been consulted regarding further recommendations of the management of mitral valve regurgitation, in the setting of Strep Pneumonia endocarditis.   According to the patient, his most recent drug use was about 3 months ago and was methamphetamine. Complicating matters for this patient is the fact that he is homeless and has no transportation.  At the time of being seen, he is eating lunch. He is no acute distress and denies shortness of breath or chest pain at this time.   Current Activity/  Functional Status: Patient is independent with mobility/ambulation, transfers, ADL's, IADL's.   Zubrod Score: At the time of surgery this patient's most appropriate activity status/level should be described as: []     0    Normal activity, no symptoms [x]     1    Restricted in physical  strenuous activity but ambulatory, able to do out light work []     2    Ambulatory and capable of self care, unable to do work activities, up and about                 more than 50%  Of the time                            []     3    Only limited self care, in bed greater than 50% of waking hours []     4    Completely disabled, no self care, confined to bed or chair []     5    Moribund  Past Medical History:  Diagnosis Date  . Hepatitis B 12/29/2017   at [facility]  . Hepatitis C   . 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 Streptococcal endocarditis 12/29/2017   at [facility]    Past Surgical History:  Procedure Laterality Date  . APPENDECTOMY    . IR ANGIO INTRA EXTRACRAN SEL COM CAROTID INNOMINATE BILAT MOD SED  01/18/2018  . IR ANGIO VERTEBRAL SEL SUBCLAVIAN INNOMINATE UNI L MOD SED  01/18/2018  . IR ANGIO VERTEBRAL SEL VERTEBRAL UNI R MOD SED  01/18/2018    Social History   Tobacco Use  Smoking Status Current Every Day Smoker  Smokeless Tobacco Never Used    Social History   Substance abuse: Heroin and more recently methamphetamine  Alcohol Use No     Allergies  Allergen Reactions  . Bee Venom Anaphylaxis  . Food Hives and Other (See Comments)    grapefruit  . Tylenol [Acetaminophen] Other (See Comments)    Pt states that he is not supposed to take this medication because of his Hep C.     Current Facility-Administered Medications  Medication Dose Route Frequency Provider Last Rate Last Dose  . acetaminophen (TYLENOL) tablet 650 mg  650 mg Oral Q6H PRN Juanito Doom, MD   650 mg at 01/21/18 0803  . buprenorphine-naloxone (SUBOXONE) 8-2 mg per SL tablet 1 tablet  1 tablet Sublingual Daily Axel Filler, MD   1 tablet at 01/22/18 0908  . famotidine (PEPCID) tablet 20 mg  20 mg Oral BID Cherene Altes, MD   20 mg at  01/21/18 1951  . feeding supplement (ENSURE ENLIVE) (ENSURE ENLIVE) liquid 237 mL  237 mL Oral TID PC & HS Cherene Altes, MD   237 mL at 01/21/18 1951  . fentaNYL (SUBLIMAZE) injection 25-50 mcg  25-50 mcg Intravenous Q5 min PRN Hosie Poisson, MD      . iohexol (OMNIPAQUE) 300 MG/ML solution 82 mL  82 mL Intravenous Once PRN Deveshwar, Willaim Rayas, MD      . midazolam (VERSED) injection 1-2 mg  1-2 mg Intravenous Q5 min PRN Hosie Poisson, MD      . penicillin G potassium 4 Million Units in dextrose 5 % 250 mL IVPB  4 Million Units Intravenous  Q4H Manele Callas, NP   Stopped at 01/22/18 1159  . senna-docusate (Senokot-S) tablet 1 tablet  1 tablet Oral BID Amie Portland, MD   1 tablet at 01/20/18 2131    Medications Prior to Admission  Medication Sig Dispense Refill Last Dose  . levofloxacin (LEVAQUIN) 750 MG tablet Take 750 mg by mouth daily.   01/13/2018 at 2100  . MELATONIN PO Take 2 tablets by mouth daily as needed (Sleep).   Unk  . warfarin (COUMADIN) 5 MG tablet Take 5 mg by mouth daily.  0 01/13/2018 at 2100   Family History: Mother is in her 55's and has multiple myeloma. Father does not have any medical problems, but patient does not stay in contact with him much.  Review of Systems:     Cardiac Review of Systems: Y or  [   N  ]= no  Resting SOB [  N ] Exertional SOB  [Y  ]  Orthopnea [ N ]   Pedal Edema [ N  ]    Palpitations Aqua.Slicker  ] Syncope  [ N ]   Presyncope [ N  ]  General Review of Systems: [Y] = yes [ N ]=no Constitional:  nausea [ N ]; night sweats [ N ]; fever [N  ]; or chills [ N ]       Eye : blurred vision [ N ]; diplopia [ N  ];  Resp: cough Aqua.Slicker  ];  wheezing[ N ];  hemoptysis[N  ];  GI:  vomiting[ N ];  dysphagia[  N]; melena[ N ];  hematochezia [ N ];  GU: Hematuria[ N ];   dysuria [ N ];               Skin: rash, swelling[ N ];, peripheral edema[ N ];   Musculosketetal:  Chronic back pain[ Y ];  Neuro:   stroke[ Y ];  vertigo[  N];  seizures[ N ];    Psych:depression[ Y ]; PTSD[Y  ];  Last dental visit unknown             Physical Exam: BP (!) 85/54 (BP Location: Left Arm)   Pulse 73   Temp 98.3 F (36.8 C) (Oral)   Resp 17   Ht 5' 11"  (1.803 m)   Wt 133 lb 3.2 oz (60.4 kg)   SpO2 98%   BMI 18.58 kg/m    General appearance: alert, cooperative and no distress Head: Normocephalic, without obvious abnormality, atraumatic Resp: clear to auscultation bilaterally Cardio: RRR, holosystolic murmur best heard along apex GI: Soft, non tender, bowel sounds present Extremities: No LE edema;feet warm Neurologic: Grossly normal  Diagnostic Studies & Laboratory data:     Recent Radiology Findings:  Most recent echo findings not available as of this consult. Prior to this, however, a TTE was done on 01/14/2018:  Transthoracic Echocardiography  (Report amended )  Patient:    Marcus [name], Marcus [name] MR #:       [id number] Study Date: 01/14/2018 Gender:     M Age:        41 Height:     180.3 cm Weight:     63.5 kg BSA:        1.77 m^2 Pt. Status: Room:   SONOGRAPHER  Merrie Roof, RDCS  PERFORMING   [facility], Inpatient  ATTENDING    Ronny Flurry, Ashish  Lenoard Aden, Ashish  cc:  ------------------------------------------------------------------- LV EF: 55% -  60%  ------------------------------------------------------------------- Indications:      CVA 56.  ------------------------------------------------------------------- History:   PMH:  Endocarditis. Embolic stroke. IVDU. Tobacco abuse. Homelessness.  ------------------------------------------------------------------- Study Conclusions  - Left ventricle: The cavity size was severely dilated. Systolic   function was normal. The estimated ejection fraction was in the   range of 55% to 60%. Wall motion was normal; there were no   regional wall motion abnormalities. Left ventricular diastolic   function parameters were normal. -  Aortic valve: Transvalvular velocity was within the normal range.   There was no stenosis. There was no regurgitation. - Mitral valve: There was a medium-sized, 1.4 cm (L) x 0.4 cm (W),   mobile vegetation on the posterior leaflet. There was severe   regurgitation directed posteriorly. - Left atrium: The atrium was severely dilated. - Right ventricle: The cavity size was normal. Wall thickness was   normal. Systolic function was normal. - Tricuspid valve: There was mild regurgitation. - Pulmonary arteries: Systolic pressure was mildly increased. PA   peak pressure: 42 mm Hg (S).  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 Consider TEE to better   evaluate if clinically indicated.  ------------------------------------------------------------------- Study data:  The previous study was not available, so comparison was made to the report of 12/30/2017.  Study status:  Emergent. Procedure:  The patient reported no pain pre or post test. Transthoracic echocardiography. Image quality was adequate.  Study completion:  There were no complications.          Transthoracic echocardiography.  M-mode, complete 2D, spectral Doppler, and color Doppler.  Birthdate:  Patient birthdate: 1976-06-08.  Age:  Patient is 42 yr old.  Sex:  Gender: male.    BMI: 19.5 kg/m^2.  Blood pressure:     92/58  Patient status:  Inpatient.  Study date: Study date: 01/14/2018. Study time: 11:41 AM.  Location:  Emergency department.  -------------------------------------------------------------------  ------------------------------------------------------------------- Left ventricle:  The cavity size was severely dilated. Systolic function was normal. The estimated ejection fraction was in the range of 55% to 60%. Wall motion was normal; there were no regional wall motion abnormalities. The transmitral flow pattern was normal. The  deceleration time of the early transmitral flow velocity was normal. The pulmonary vein flow pattern was normal. The tissue Doppler parameters were normal. Left ventricular diastolic function parameters were normal.  ------------------------------------------------------------------- Aortic valve:   Trileaflet; normal thickness leaflets. Mobility was not restricted.  Doppler:  Transvalvular velocity was within the normal range. There was no stenosis. There was no regurgitation.   ------------------------------------------------------------------- Aorta:  Aortic root: The aortic root was normal in size.  ------------------------------------------------------------------- Mitral valve:   Structurally normal valve.   Mobility was not restricted. There was a medium-sized, 1.4 cm (L) x 0.4 cm (W), mobile vegetation on the posterior leaflet.  Doppler: Transvalvular velocity was within the normal range. There was no evidence for stenosis. There was severe regurgitation directed posteriorly.    Peak gradient (D): 8 mm Hg.  ------------------------------------------------------------------- Left atrium:  The atrium was severely dilated.  ------------------------------------------------------------------- Right ventricle:  The cavity size was normal. Wall thickness was normal. Systolic function was normal.  ------------------------------------------------------------------- Pulmonic valve:    Structurally normal valve.   Cusp separation was normal.  Doppler:  Transvalvular velocity was within the normal range. There was no evidence for stenosis. There was no regurgitation.  ------------------------------------------------------------------- Tricuspid valve:   Structurally normal valve.    Doppler: Transvalvular velocity was  within the normal range. There was mild regurgitation.  ------------------------------------------------------------------- Pulmonary artery:   The main  pulmonary artery was normal-sized. Systolic pressure was mildly increased.  ------------------------------------------------------------------- Right atrium:  The atrium was normal in size.  ------------------------------------------------------------------- Pericardium:  There was no pericardial effusion.  ------------------------------------------------------------------- Systemic veins: Inferior vena cava: The vessel was normal in size. The respirophasic diameter changes were in the normal range (= 50%), consistent with normal central venous pressure   Ct Angio Abdomen W &/or Wo Contrast  Result Date: 01/21/2018 CLINICAL DATA:  Aorta occlusion EXAM: CT ANGIOGRAPHY ABDOMEN TECHNIQUE: Multidetector CT imaging of the abdomen was performed using the standard protocol during bolus administration of intravenous contrast. Multiplanar reconstructed images and MIPs were obtained and reviewed to evaluate the vascular anatomy. CONTRAST:  123m ISOVUE-370 IOPAMIDOL (ISOVUE-370) INJECTION 76% [facility]  [facility] FINDINGS: VASCULAR Aorta: The aorta is abruptly occluded just below the takeoff of the IMA. The borders of the abdominal aorta are poorly defined at the level of the occlusion. Mild aneurysmal dilatation of the distal aorta measuring up to 3.7 cm is suspected. At the level of the occlusion, there is mixed low-density and isodensity within the thrombus, as well as a lack of atherosclerotic calcification, suggesting subacute rather than acute age. There is also a mild thickening of the wall of the aorta with stranding in the adjacent fat. These findings suggest an inflammatory process. Celiac: Patent and ectatic. There is abrupt occlusion of the splenic artery, 2 cm from the hilum of the spleen. Distal branches reconstitute. The hepatic artery and its branches are grossly patent. Gastroduodenal artery is patent. SMA: Patent and ectatic. Renals: 2 right  renal arteries and a single left renal artery are patent. IMA: Patent. Inflow: Bilateral common iliac arteries are occluded. There is some filling of the mid and distal left common iliac artery with contrast flow. The left common iliac artery measures 1.9 cm in maximal caliber. Right common iliac artery measures 3.6 cm in caliber. The left internal and external iliac arteries reconstitute and are partially imaged. Veins: No evidence of DVT. Review of the MIP images confirms the above findings. NON-VASCULAR Lower chest: No acute abnormality. Hepatobiliary: Unremarkable Pancreas: Unremarkable Spleen: There is a large nonspecific there is a complex cystic lesion in the spleen measuring up to 11.6 cm. Adrenals/Urinary Tract: There is scarring in the lower pole of the left kidney associated with wedge-shaped areas of low density. These findings likely represent small infarcts. This is also present in the upper pole of the right kidney to a lesser degree. Stomach/Bowel: No obvious mass in the colon. No evidence of small-bowel obstruction. Lymphatic: Small para-aortic lymph nodes are present. Other: No free fluid.  Postoperative changes from appendectomy. Musculoskeletal: No vertebral compression deformity. Bilateral L5 pars defects. No evidence of anterolisthesis. IMPRESSION: VASCULAR There is abrupt occlusion of the aorta below the level of the IMA as described on the prior report. Mixed density within the aorta suggest subacute age. Also, there is thickening and inflammatory change of the wall of the aorta suggesting vasculitis or mycotic aneurysm. There is also associated occlusion of the bilateral common iliac arteries. Focal occlusion of the splenic artery. NON-VASCULAR Complex cystic lesion in the spleen is nonspecific. Small infarcts in scarring in the kidneys bilaterally. Electronically Signed   By: AMarybelle KillingsM.D.   On: 01/21/2018 12:05     I have independently reviewed the above radiologic studies and  discussed with the patient   Recent  Lab Findings: Lab Results  Component Value Date   WBC 12.6 (H) 01/21/2018   HGB 8.9 (L) 01/21/2018   HCT 30.2 (L) 01/21/2018   PLT 500 (H) 01/21/2018   GLUCOSE 136 (H) 01/21/2018   CHOL 100 01/15/2018   TRIG 43 01/15/2018   HDL 27 (L) 01/15/2018   LDLCALC 64 01/15/2018   ALT 54 01/19/2018   AST 54 (H) 01/19/2018   NA 139 01/21/2018   K 4.0 01/21/2018   CL 101 01/21/2018   CREATININE 0.79 01/21/2018   BUN 23 (H) 01/21/2018   CO2 28 01/21/2018   TSH 1.213 07/06/2016   INR 1.18 01/18/2018   HGBA1C 4.8 01/15/2018    Assessment / Plan:   1. 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 Dr. Roxy Manns will evaluate, but patient unlikely surgical candidate at this time. 2. Hepatitis B and C-ongoing workup and treatment per Infectious disease 3. Stroke-bilateral MCA subacute infarcts as well as right frontal high convexity small hemorrhage - likely related to septic emboli as well as mycotic aneurysm. 4. Chronically occluded aorta and iliac arteries-not surgical candidate per vascular surgery.   I  spent 15 minutes counseling the patient face to face and over 60 minutes overall.   Marcus Pinks PA-C 01/22/2018 12:47 PM   I have seen and examined the patient and agree with the assessment as outlined above by Marcus [name].  Patient is a 42 yo male w/ history of polysubstance abuse recently hospitalized at [facility] with Streptococcus pneumonia bacterial endocarditis complicated by septic embolization to the brain, spleen, kidneys, and abdominal aorta causing occlusion of the distal aorta and mycotic aneurysm.  The patient was felt not to be a surgical candidate and long-term antibiotics recommended.  Apparently the patient left the hospital AGAINST MEDICAL ADVICE and subsequently wound up getting admitted to [facility] on Jan 13, 2018 with generalized  weakness, dizziness, and back pain.  I have personally reviewed the patient's transthoracic echocardiogram and CT angiogram of the abdomen.  He has known bacterial endocarditis involving the mitral valve associated with a moderate sized vegetation, severe mitral regurgitation, and evidence of multiple different episodes of septic embolization to the brain, spleen, kidneys, and abdominal aorta.  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  Surgical intervention for management of the patient's mitral valve endocarditis would be contraindicated prior to definitive management of the patient's more imminently life-threatening problem with his abdominal aorta.    If the patient is not a candidate for surgical management of his aortic aneurysm then I would recommend continued long-term intravenous antibiotics with referral to the palliative care team for long-term management.  I have discussed matters at length with the patient this afternoon at his bedside.  He seems to have a limited understanding of his unfortunate predicament.  He has no family support and has a truly desperate set of circumstances deserving of quality palliative management.  All questions answered.     I spent in excess of 60 minutes during the conduct of this hospital encounter and >50% of this time involved direct face-to-face encounter with the patient for counseling and/or coordination of their care.    [name], MD 01/22/2018 4:32 PM

## 2018-01-22 NOTE — Progress Notes (Addendum)
Progress Note  Patient Name: Marcus [name] Date of Encounter: 01/22/2018  Primary Cardiologist: No primary care provider on file.   Subjective   Complains of pain in the R hand.  No chest pain or shortness of breath.  Inpatient Medications    Scheduled Meds: . buprenorphine-naloxone  1 tablet Sublingual Daily  . famotidine  20 mg Oral BID  . feeding supplement (ENSURE ENLIVE)  237 mL Oral TID PC & HS  . senna-docusate  1 tablet Oral BID   Continuous Infusions: . pencillin G potassium IV 4 Million Units (01/22/18 0858)   PRN Meds: acetaminophen, fentaNYL (SUBLIMAZE) injection, iohexol, midazolam   Vital Signs    Vitals:   01/20/18 2053 01/21/18 0607 01/21/18 1933 01/22/18 0409  BP: 100/62 95/63 95/61  (!) 85/54  Pulse: 73     Resp: Temp: 98.7 F (37.1 C) 98.6 F (37 C) 98 F (36.7 C) 98.3 F (36.8 C)  TempSrc: Oral Oral Oral Oral  SpO2: 99% 98%    Weight:  133 lb 3.2 oz (60.4 kg)    Height:       No intake or output data in the 24 hours ending 01/22/18 0921 Filed Weights   01/19/18 0245 01/20/18 0309 01/21/18 0607  Weight: 139 lb 1.8 oz (63.1 kg) 133 lb 12.8 oz (60.7 kg) 133 lb 3.2 oz (60.4 kg)    Telemetry    N/a - Personally Reviewed  ECG    01/14/18: Sinus rhythm.  Rate 95 bpm.  PVCs. QTc 487 ms- Personally Reviewed  Physical Exam   VS:  BP (!) 85/54 (BP Location: Left Arm)   Pulse 73   Temp 98.3 F (36.8 C) (Oral)   Resp 17   Ht  (1.803 m)   Wt 133 lb 3.2 oz (60.4 kg)   SpO2 98%   BMI 18.58 kg/m  , BMI Body mass index is 18.58 kg/m. GENERAL:  Chronically ill-appearing.  Frail.  HEENT: Pupils equal round and reactive, fundi not visualized, oral mucosa unremarkable NECK:  No jugular venous distention, waveform within normal limits, carotid upstroke brisk and symmetric, no bruits LUNGS:  Clear to auscultation bilaterally HEART:  RRR.  PMI not displaced or sustained,S1 and S2 within normal limits, no S3, no S4, no clicks, no  rubs, III/VI holosystolic murmur at the apex radiating anteriorly ABD:  Flat, positive bowel sounds normal in frequency in pitch, no bruits, no rebound, no guarding, no midline pulsatile mass, no hepatomegaly, no splenomegaly EXT: DP/PT pulses not palpable. no edema, no cyanosis no clubbing SKIN:  No rashes no nodules NEURO:  Cranial nerves II through XII grossly intact, motor grossly intact throughout [facility]:  Cognitively intact, oriented to person place and time    Labs    Chemistry Recent Labs  Lab 01/17/18 0355 01/18/18 0342 01/19/18 0339 01/21/18 1232  NA 140 139 141 139  K 3.9 4.2 4.2 4.0  CL 106 104 105 101  CO2 GLUCOSE 103* 94 97 136*  BUN 23*  CREATININE 0.83 0.72 0.68 0.79  CALCIUM 8.3* 8.6* 8.4* 8.6*  PROT 6.8 7.1 6.7  --   ALBUMIN 1.8* 1.9* 1.8*  --   AST 33 43* 54*  --   ALT 34 42 54  --   ALKPHOS 61 59 72  --   BILITOT 0.5 0.4 0.4  --   GFRNONAA >60 >60 >60 >60  GFRAA >60 >60 >60 >60  ANIONGAP  Hematology Recent Labs  Lab 01/18/18 0342 01/19/18 0339 01/21/18 1232  WBC 10.4 10.7* 12.6*  RBC 3.13* 3.09* 3.34*  HGB 8.1* 8.2* 8.9*  HCT 27.7* 27.8* 30.2*  MCV 88.5 90.0 90.4  MCH 25.9* 26.5 26.6  MCHC 29.2* 29.5* 29.5*  RDW 16.4* 16.5* 17.0*  PLT 524* 522* 500*    Cardiac EnzymesNo results for input(s): TROPONINI in the last 168 hours. No results for input(s): TROPIPOC in the last 168 hours.   BNPNo results for input(s): BNP, PROBNP in the last 168 hours.   DDimer No results for input(s): DDIMER in the last 168 hours.   Radiology    Ct Angio Abdomen W &/or Wo Contrast  Result Date: 01/21/2018 CLINICAL DATA:  Aorta occlusion EXAM: CT ANGIOGRAPHY ABDOMEN TECHNIQUE: Multidetector CT imaging of the abdomen was performed using the standard protocol during bolus administration of intravenous contrast. Multiplanar reconstructed images and MIPs were obtained and reviewed to evaluate the vascular anatomy. CONTRAST:   ISOVUE-370 IOPAMIDOL (ISOVUE-370) INJECTION 76% COMPARISON:  12/29/2017 at [facility], report only FINDINGS: VASCULAR Aorta: The aorta is abruptly occluded just below the takeoff of the IMA. The borders of the abdominal aorta are poorly defined at the level of the occlusion. Mild aneurysmal dilatation of the distal aorta measuring up to 3.7 cm is suspected. At the level of the occlusion, there is mixed low-density and isodensity within the thrombus, as well as a lack of atherosclerotic calcification, suggesting subacute rather than acute age. There is also a mild thickening of the wall of the aorta with stranding in the adjacent fat. These findings suggest an inflammatory process. Celiac: Patent and ectatic. There is abrupt occlusion of the splenic artery, 2 cm from the hilum of the spleen. Distal branches reconstitute. The hepatic artery and its branches are grossly patent. Gastroduodenal artery is patent. SMA: Patent and ectatic. Renals: 2 right renal arteries and a single left renal artery are patent. IMA: Patent. Inflow: Bilateral common iliac arteries are occluded. There is some filling of the mid and distal left common iliac artery with contrast flow. The left common iliac artery measures 1.9 cm in maximal caliber. Right common iliac artery measures 3.6 cm in caliber. The left internal and external iliac arteries reconstitute and are partially imaged. Veins: No evidence of DVT. Review of the MIP images confirms the above findings. NON-VASCULAR Lower chest: No acute abnormality. Hepatobiliary: Unremarkable Pancreas: Unremarkable Spleen: There is a large nonspecific there is a complex cystic lesion in the spleen measuring up to 11.6 cm. Adrenals/Urinary Tract: There is scarring in the lower pole of the left kidney associated with wedge-shaped areas of low density. These findings likely represent small infarcts. This is also present in the upper pole of the right kidney to a lesser degree.  Stomach/Bowel: No obvious mass in the colon. No evidence of small-bowel obstruction. Lymphatic: Small para-aortic lymph nodes are present. Other: No free fluid.  Postoperative changes from appendectomy. Musculoskeletal: No vertebral compression deformity. Bilateral L5 pars defects. No evidence of anterolisthesis. IMPRESSION: VASCULAR There is abrupt occlusion of the aorta below the level of the IMA as described on the prior report. Mixed density within the aorta suggest subacute age. Also, there is thickening and inflammatory change of the wall of the aorta suggesting vasculitis or mycotic aneurysm. There is also associated occlusion of the bilateral common iliac arteries. Focal occlusion of the splenic artery. NON-VASCULAR Complex cystic lesion in the spleen is nonspecific.  Small infarcts in scarring in the kidneys bilaterally. Electronically Signed   By: Jolaine Click M.D.   On: 01/21/2018 12:05    Cardiac Studies   Echo 01/14/18: Study Conclusions  - Left ventricle: The cavity size was severely dilated. Systolic   function was normal. The estimated ejection fraction was in the   range of 55% to 60%. Wall motion was normal; there were no   regional wall motion abnormalities. Left ventricular diastolic   function parameters were normal. - Aortic valve: Transvalvular velocity was within the normal range.   There was no stenosis. There was no regurgitation. - Mitral valve: There was a medium-sized, 1.4 cm (L) x 0.4 cm (W),   mobile vegetation on the posterior leaflet. There was severe   regurgitation directed posteriorly. - Left atrium: The atrium was severely dilated. - Right ventricle: The cavity size was normal. Wall thickness was   normal. Systolic function was normal. - Tricuspid valve: There was mild regurgitation. - Pulmonary arteries: Systolic pressure was mildly increased. PA   peak pressure: 42 mm Hg (S).  Impressions:  - Findings consisent with endocarditis of the mitral valve  with   associated severe mitral regurgitation. Consider TEE to better   evaluate if clinically indicated.  Patient Profile     Mr. Maclellan is a 49M with IV drug abuse, Hepatitis B/C, bipolar disorder and chronic back pain here with S. 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 Keim CT-A 5/23 demonstrated subacute complete occlusion of the aorta below the IMA.  Intimal thickening and inflammation was concerning for vasculitis versus mycotic aneurysm.  Clinically this is consistent with mycotic aneurysm.  There is also occlusion of bilateral common iliac arteries.  It does not make sense for this to be thrombus.  Recommend contacting vascular surgery.  If, indeed there is nothing that can be done for him surgically, then there is no need to pursue replacing his mitral valve.  If he is not a candidate for mitral valve surgery then there is no utility of a transesophageal echo.  As of now he is scheduled for TEE Monday.  If he is not a surgical candidate then he will need Palliative Care.  His last EKG was 5/16.  We will repeat this to ensure there is no change in his PR interval which could indicate perivalvular extension of the endocarditis.  Continue antibiotics per ID.  Clinically he is stable from a heart failure standpoint, though he isn't doing much physically.       For questions or updates, please contact [facility] Please consult [email or web address] for contact info under Cardiology/STEMI.      Signed, Chilton Si, MD  01/22/2018, 9:21 AM

## 2018-01-22 NOTE — Progress Notes (Signed)
[facility]  Date of Admission:  01/14/2018   Total days of antibiotics 8        Day 3 Penicillin          Patient ID: Marcus [name] is a 42 y.o. male with poly sub abuse, chronic Hep B, Hep C and in 12/29/17 diagnosed with strep pneumo endocarditis of the mitral valve at [facility]. Also noted to have subtotal occlusion of aorta @ IMA level with splenic/renal infarctions. Received PCN through 5/10 and transitioned to oral Levaquin 750 mg daily. Presented to [facility] 5/16 with back pain and AMS. Found to have persistent vegetation of MV on TTE and bilateral MCA/watershed infarctions suspicious for septic emboli. Mycotic aneurysm r/o with cerebral angiography.   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 buprenorphine-naloxone  1 tablet Sublingual Daily  . famotidine  20 mg Oral BID  . feeding supplement (ENSURE ENLIVE)  237 mL Oral TID PC & HS  . senna-docusate  1 tablet Oral BID    SUBJECTIVE: No complaints today. Still very appreciative of suboxone. Vascular surgery has seen and tells me "no surgery for now."   Allergies  Allergen Reactions  . Bee Venom Anaphylaxis  . Food Hives and Other (See Comments)    grapefruit  . Tylenol [Acetaminophen] Other (See Comments)    Pt states that he is not supposed to take this medication because of his Hep C.     OBJECTIVE: Vitals:   01/20/18 2053 01/21/18 0607 01/21/18 1933 01/22/18 0409  BP: 100/62 95/63 95/61  (!) 85/54  Pulse: 73     Resp: Temp: 98.7 F (37.1 C) 98.6 F (37 C) 98 F (36.7 C) 98.3 F (36.8 C)  TempSrc: Oral Oral Oral Oral  SpO2: 99%  98%    Weight:  133 lb 3.2 oz (60.4 kg)    Height:       Body mass index is 18.58 kg/m.  Physical Exam  Constitutional: He is oriented to person, place, and time. He appears well-developed and well-nourished.  Disheveled. Resting in bed, more comfortable.   HENT:  Mouth/Throat: Oropharynx is clear and moist and mucous membranes are normal. Normal dentition. No dental abscesses.  Cardiovascular: Normal rate, regular rhythm and normal pulses.  Murmur heard.  Systolic murmur is present with a grade of 3/6. Pulmonary/Chest: Effort normal and breath sounds normal. No respiratory distress.  Abdominal: Soft. He exhibits no distension. There is no tenderness.  Lymphadenopathy:    He has no cervical adenopathy.  Neurological: He is alert and oriented to person, place, and time.  Skin: Skin is warm and dry. No rash noted.  Psychiatric: He has a normal mood and affect. Judgment normal.  Less anxious today.    Lab Results Lab Results  Component Value Date   WBC 12.6 (H) 01/21/2018   HGB 8.9 (L) 01/21/2018   HCT 30.2 (L) 01/21/2018   MCV 90.4 01/21/2018   PLT 500 (H) 01/21/2018    Lab Results  Component Value Date   CREATININE 0.79 01/21/2018   BUN 23 (H) 01/21/2018  NA 139 01/21/2018   K 4.0 01/21/2018   CL 101 01/21/2018   CO2 28 01/21/2018    Lab Results  Component Value Date   ALT 54 01/19/2018   AST 54 (H) 01/19/2018   ALKPHOS 72 01/19/2018   BILITOT 0.4 01/19/2018     Microbiology: BCx 5/16 >> NG   Assessment:  Marcus [name] is a 42 y.o. male with Hep B (eAg+, DNA 806,000,000), Hep C (1a, tx naive), IVDU (meth) and known streptococcal pneumoniae (PCN MIC 0.25) endocarditis of the mitral valve with secondary multifocal septic embolisms (spleen, kidney) now with watershed strokes on brain MRI at 3 weeks of treatment.   CTA Aorta with aneurysm (?mycotic vs. Vasculitis) and thrombus. Previously described to be "fatal condition for which there is no surgical cure" and  recommended anticoagulation however now with ICH unable to maintain this. Has been seen by Dr. Imogene Burn and his team for consideration and feel no viable surgery as all would involve prosthetic graft for reconstruction. Dr. Cornelius Moras has also seen the patient and feels if he cannot have vascular problems corrected then MV repair is not indicated.   TEE pending to follow up vegetation and valve Monday --> seems this is no longer necessary   Appreciate Dr. Oswaldo Done helping with starting him back on suboxone to help with cravings and some of his analgesic need. Would recommend palliative care to help with not only understanding of the current complex situation and what seems like a strong possibility of a poor outcome but also symptoms management.   Plan:  1. Continue Penicillin infusion - longer duration of therapy beyond the planned 5/28 course. Likely 6 weeks with suspicion for mycotic aneurysms and ongoing embolisms.  2. Repeat Head CT per neuro to determine when/if anticoagulation can be resumed.   Dr. Daiva Eves is available over the weekend @ [phone] for ID questions. Will follow up on Monday.   [name], MSN, NP-C [facility] Cell: [phone] Pager: [phone]  01/22/2018  5:42 PM

## 2018-01-22 NOTE — Consult Note (Addendum)
Hospital Consult    Reason for Consult:  Occluded aorta/aneurysm Requesting Physician:  Blake Divine MRN #:  [id number]  History of Present Illness: This is a 42 y.o. male who presented to [facility] ED on 01/14/18 with AMS and exacerbation of his back pain.  He has chronic back pain since an injury snowboarding a few years ago.   He has a hx of poly substance abuse, chronic Hepatitis B and C.  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  He had a vegetation on the posterior leaflet of the MV.  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  (see Vascular recs from [facility] below).  He is not on any AC at this time due to CVA (see below)  His MRI of the brain revealed multifocal subacute bilateral MCA/watershed territory infarcts and right frontal high convexity small hemorrhage.    Pt was seen by Vascular Surgery at [facility] earlier this month and recommended [facility] and no surgical intervention.  They also noted his prognosis to be poor and is a fatal condition with no surgical cure and recommended palliative care consult.  He had CTA at [facility] on 12/30/17 revealing: 1.  Abrupt occlusion of the abdominal aorta just inferior to the inferior mesenteric artery, as well as of the splenic artery and a branch of the superior mesenteric artery. Intraluminal density beyond these areas concerning for thrombus. 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 Wall enhancement and perivascular stranding in this region suggests acute to subacute nature.The distal left common iliac and proximal small right femoral arteries fill via collaterals. 3.Large splenic infarct.  4.Multiple bilateral kidney infarcts. 5.Circumferential bladder wall thickening concerning for cystitis. Ischemic or infectious etiologies are in the  differential. 6.Small low to intermediate density left pleural effusion, which could reflect mildly complex effusion or a component of hemothorax.  The pt is homeless.    VVS is asked to evaluate pt.  The pt states he has had back pain for about 5 years but it has been worse lately.  He doesn't have any pain in his feet or wounds.   Past Medical History:  Diagnosis Date  . Hepatitis B 12/29/2017   at [facility]  . Hepatitis C   . 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 APPENDECTOMY    . IR ANGIO INTRA EXTRACRAN SEL COM CAROTID INNOMINATE BILAT MOD SED  01/18/2018  . IR ANGIO VERTEBRAL SEL SUBCLAVIAN INNOMINATE UNI L MOD SED  01/18/2018  . IR ANGIO VERTEBRAL SEL VERTEBRAL UNI R MOD SED  01/18/2018    Allergies  Allergen Reactions  . Bee Venom Anaphylaxis  . Food Hives and Other (See Comments)    grapefruit  . Tylenol [Acetaminophen] Other (See Comments)    Pt states that he is not supposed to take this medication because of his Hep C.     Prior to Admission medications   Medication Sig Start Date End Date Taking? Authorizing Provider  levofloxacin (LEVAQUIN) 750 MG tablet Take 750 mg by mouth daily.  Yes [provider]  MELATONIN PO Take 2 tablets by mouth daily as needed (Sleep).   Yes [provider]  warfarin (COUMADIN) 5 MG tablet Take 5 mg by mouth daily. 01/06/18  Yes [provider]    Social History   Socioeconomic History  . Marital status: Single    Spouse name: Not on file  . Number of children: Not on file  . Years of education: Not on file  . Highest education level: Not on file  Occupational History  . Occupation: Unemployed  Social Needs  . Financial resource strain: Not on file  . Food  insecurity:    Worry: Not on file    Inability: Not on file  . Transportation needs:    Medical: Not on file    Non-medical: Not on file  Tobacco Use  . Smoking status: Current Every Day Smoker  . Smokeless tobacco: Never Used  Substance and Sexual Activity  . Alcohol use: No  . Drug use: Yes    Frequency: 7.0 times per week    Types: Methamphetamines, Heroin    Comment: Daily use where possible  . Sexual activity: Not Currently  Lifestyle  . Physical activity:    Days per week: Not on file    Minutes per session: Not on file  . Stress: Not on file  Relationships  . Social connections:    Talks on phone: Not on file    Gets together: Not on file    Attends religious service: Not on file    Active member of club or organization: Not on file    Attends meetings of clubs or organizations: Not on file    Relationship status: Not on file  . Intimate partner violence:    Fear of current or ex partner: Not on file    Emotionally abused: Not on file    Physically abused: Not on file    Forced sexual activity: Not on file  Other Topics Concern  . Not on file  Social History Narrative   Homeless     History reviewed. No pertinent family history.  ROS:  Positive    Negative    All sytems reviewed and are negative  Cardiac:  chest pain/pressure  palpitations  SOB lying flat  DOE  endocarditis-vegetation on MV  Vascular:  pain in legs while walking  pain in legs at rest  pain in legs at night  non-healing ulcers  hx of DVT  swelling in legs  Pulmonary:  productive cough  asthma/wheezing  home O2  Neurologic:  weakness in  arms  legs  numbness in  arms  legs  hx of CVA  mini stroke difficulty speaking or slurred speech  temporary loss of vision in one eye  dizziness  Hematologic:  hx of cancer  bleeding problems  problems with blood clotting easily  Endocrine:    diabetes  thyroid  disease  GI  vomiting blood  blood in stool  Hep B & C  GU:  CKD/renal failure  HD--[]  M/W/F or  T/T/S  burning with urination  blood in urine  Psychiatric:  anxiety  depression  Musculoskeletal:  arthritis  joint pain  back pain  Integumentary:  rashes  ulcers  Constitutional:  fever  chills   Physical Examination  Vitals:   01/21/18 1933 01/22/18 0409  BP: 95/61 (!) 85/54  Pulse:    Resp: 19 17  Temp: 98 F (36.7 C) 98.3 F (36.8 C)  SpO2:     Body mass index is 18.58 kg/m.  General:  WDWN in NAD Gait: Not observed HENT: WNL, normocephalic Pulmonary: normal non-labored breathing, without Rales, rhonchi,  wheezing Cardiac: regular, with Murmurswithout carotid bruits Abdomen:  soft, NT/ND, no masses Skin: without rashes Vascular Exam/Pulses:  Right Left  Radial 2+ (normal) 1+ (weak)  Ulnar Unable to palpate  Unable to palpate   Brachial Not palpated Not palpated  Femoral absent absent  DP absent absent  PT Monophasic doppler signal present Monophasic doppler signal present  Peroneal absent absent   Extremities: without ischemic changes, without Gangrene , without cellulitis; without open wounds; motor and sensation are in tact; Musculoskeletal: no muscle wasting or atrophy  Neurologic: A&O X 3;  No focal weakness or paresthesias are detected; speech is fluent/normal; right hand grip slightly weaker than left due to sore on palmar surface of right hand. Psychiatric:  The pt has flat affect. Lymph:  No inguinal lymphadenopathy   CBC    Component Value Date/Time   WBC 12.6 (H) 01/21/2018 1232   RBC 3.34 (L) 01/21/2018 1232   HGB 8.9 (L) 01/21/2018 1232   HCT 30.2 (L) 01/21/2018 1232   PLT 500 (H) 01/21/2018 1232   MCV 90.4 01/21/2018 1232   MCH 26.6 01/21/2018 1232   MCHC 29.5 (L) 01/21/2018 1232   RDW 17.0 (H) 01/21/2018 1232   LYMPHSABS 3.3 07/07/2017 0655   MONOABS 0.5 07/07/2017 0655   EOSABS 0.3  07/07/2017 0655   BASOSABS 0.0 07/07/2017 0655    BMET    Component Value Date/Time   NA 139 01/21/2018 1232   K 4.0 01/21/2018 1232   CL 101 01/21/2018 1232   CO2 28 01/21/2018 1232   GLUCOSE 136 (H) 01/21/2018 1232   BUN 23 (H) 01/21/2018 1232   CREATININE 0.79 01/21/2018 1232   CALCIUM 8.6 (L) 01/21/2018 1232   GFRNONAA >60 01/21/2018 1232   GFRAA >60 01/21/2018 1232    COAGS: Lab Results  Component Value Date   INR 1.18 01/18/2018   INR 1.18 01/17/2018   INR 1.98 01/15/2018     Non-Invasive Vascular Imaging:   CTA abdomen 01/21/18 IMPRESSION: VASCULAR  There is abrupt occlusion of the aorta below the level of the IMA as described on the prior report. Mixed density within the aorta suggest subacute age. Also, there is thickening and inflammatory change of the wall of the aorta suggesting vasculitis or mycotic aneurysm.  There is also associated occlusion of the bilateral common iliac arteries.  Focal occlusion of the splenic artery.  NON-VASCULAR  Complex cystic lesion in the spleen is nonspecific.  Small infarcts in scarring in the kidneys bilaterally.  Statin:  No. Beta Blocker:  No. Aspirin:  No. ACEI:  No. ARB:  No. CCB use:  No Other antiplatelets/anticoagulants:  No.    ASSESSMENT/PLAN: This is a 42 y.o. male with endocarditis, septic embolic and chronically occluded aorta and iliac arteries   -pt has bilateral monophasic PT doppler signals.  His motor and sensation are in tact in BLE.  Do not see any results for ABI here or at outside facility.  Will order.  -given he has active endocarditis and septic emboli, he is not a candidate for Aortobifemoral bypass grafting.  Since his motor and sensation are in tact and doppler signals in bilateral PT, will not recommend any surgery at this time.  He was seen by Vascular surgery at [facility] who also recommended no surgery. -abx per ID  Doreatha Massed, PA-C [facility] [phone]  Addendum  I have independently interviewed and examined the patient, and I agree with the physician assistant's findings.  This patient has a known aortic occlusion from his recent work-up at [facility].  Reportedly Vascular Surgery at [facility] decided the patient was NOT a surgical candidate and palliative care should be pursued.  This patient with known infectious endocarditis is NOT a candidate for reconstruction as prosthetic graft would be needed.  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  This patient is NOT a NAIS candidate and cryo-aorta is unlikely to last long enough, so there are no NON-Prosthetic options.  The patient has intact motor and sensation in both feet, emphasizing again that this patient's condition is chronic.     Radiology     Ct Angio Abdomen W &/or Wo Contrast  Result Date: 01/21/2018 CLINICAL DATA:  Aorta occlusion EXAM: CT ANGIOGRAPHY ABDOMEN TECHNIQUE: Multidetector CT imaging of the abdomen was performed using the standard protocol during bolus administration of intravenous contrast. Multiplanar reconstructed images and MIPs were obtained and reviewed to evaluate the vascular anatomy. CONTRAST:  ISOVUE-370 IOPAMIDOL (ISOVUE-370) INJECTION 76% COMPARISON:  12/29/2017 at [facility], report only FINDINGS: VASCULAR Aorta: The aorta is abruptly occluded just below the takeoff of the IMA. The borders of the abdominal aorta are poorly defined at the level of the occlusion. Mild aneurysmal dilatation of the distal aorta measuring up to 3.7 cm is suspected. At the level of the occlusion, there is mixed low-density and isodensity within the thrombus, as well as a lack of atherosclerotic calcification, suggesting subacute rather than acute age. There is also a mild thickening of the wall of the aorta with stranding in the adjacent fat. These findings suggest an  inflammatory process. Celiac: Patent and ectatic. There is abrupt occlusion of the splenic artery, 2 cm from the hilum of the spleen. Distal branches reconstitute. The hepatic artery and its branches are grossly patent. Gastroduodenal artery is patent. SMA: Patent and ectatic. Renals: 2 right renal arteries and a single left renal artery are patent. IMA: Patent. Inflow: Bilateral common iliac arteries are occluded. There is some filling of the mid and distal left common iliac artery with contrast flow. The left common iliac artery measures 1.9 cm in maximal caliber. Right common iliac artery measures 3.6 cm in caliber. The left internal and external iliac arteries reconstitute and are partially imaged. Veins: No evidence of DVT. Review of the MIP images confirms the above findings. NON-VASCULAR Lower chest: No acute abnormality. Hepatobiliary: Unremarkable Pancreas: Unremarkable Spleen: There is a large nonspecific there is a complex cystic lesion in the spleen measuring up to 11.6 cm. Adrenals/Urinary Tract: There is scarring in the lower pole of the left kidney associated with wedge-shaped areas of low density. These findings likely represent small infarcts. This is also present in the upper pole of the right kidney to a lesser degree. Stomach/Bowel: No obvious mass in the colon. No evidence of small-bowel obstruction. Lymphatic: Small para-aortic lymph nodes are present. Other: No free fluid.  Postoperative changes from appendectomy. Musculoskeletal: No vertebral compression deformity. Bilateral L5 pars defects. No evidence of anterolisthesis. IMPRESSION: VASCULAR There is abrupt occlusion of the aorta below the level of the IMA as described on the prior report. Mixed density within the aorta suggest subacute age. Also, there is thickening and inflammatory change of the wall of the aorta suggesting  vasculitis or mycotic aneurysm. There is also associated occlusion of the bilateral common iliac arteries. Focal  occlusion of the splenic artery. NON-VASCULAR Complex cystic lesion in the spleen is nonspecific. Small infarcts in scarring in the kidneys bilaterally. Electronically Signed   By: Jolaine Click M.D.   On: 01/21/2018 12:05   I reviewed the CTA, there is an obvious distal aortoiliac occlusion.  The study was terminated too superior as the entire pelvis is not imaged.   Nothing more to offer in this case  Reviewing his cardiology notes, it appears that Palliative care should be considered.   Leonides Sake, MD, FACS [facility] [place] Office: [phone] Pager: [phone]  01/22/2018, 1:05 PM

## 2018-01-23 ENCOUNTER — Inpatient Hospital Stay (HOSPITAL_COMMUNITY): Payer: Medicaid Other

## 2018-01-23 DIAGNOSIS — R0602 Shortness of breath: Secondary | ICD-10-CM

## 2018-01-23 NOTE — Progress Notes (Signed)
PROGRESS NOTE    Marcus [name]  WUJ:[id number] DOB: 19-Dec-1975 DOA: 01/14/2018 PCP: [facility]   Brief Narrative: 41yo homeless man w/ a hx of polysubstance abuse, Hep B and Hep C, and depression/SI who was diagnosed with endocarditis in April 2019 and treated originally at [facility]. 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  He had been on anticoagualtion with coumadin. He presented to the [facility] ED w/ c/o back pain, and was found to be encephalopathic. He underwent TTE showing a 1.4 cm mobile vegetation on the posterior leaflet on the MV with severe regurgitation.     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  Blood cultures this admit negative so far.  Current only penicillin IV as per ID this admission.  Not a surgical candidate for MVR as per cardiothoracic surgery.    Severe MR from the vegetations: CT angio of the abdomen with and without contrast showed  There is abrupt occlusion of the aorta below the level of the IMA as described on the prior report. Mixed density within the aorta suggest subacute age. Also, there is thickening and inflammatory change of the wall of the aorta suggesting vasculitis or mycotic aneurysm.There is also associated occlusion of the bilateral common iliac arteries.Focal occlusion of the splenic  artery. VASCULAR surgery suggested pt not a candidate for surgery.      MCA infarcts due to septic emboli:  No further work up as per neurology.  MRI Brain: Multifocal subacute bilateral MCA/watershed territory infarcts and right frontal high convexity small hemorrhage. No anti coagulation as per neurology.  Cerebral angiogram on 5 /20 does not show any mycotic aneurysm.  Recommend outpatient follow up with stroke clinic NP at [facility] in about 4 weeks.  If anti coagulation is needed , we need to obtain a repeat CT head without contrast to rule out worsening ICH. If the ICH is stable ,then he can be started on IV heparin as per Dr Roda Shutters with neurology.    Polysubstance abuse:  Pt reports he was on Suboxone previously and requested to be restarted on it.  On 8 mg SL suboxone.    Homeless ness: CSW consulted.    Anemia of  chronic disease Hemoglobin stable around 8.    Thrombocytosis suspect from the infection.  Platelets stable around 500,000   Leukocytosis:  Pt remains afebrile. Blood cultures negative so far.    In view of his poor prognosis, palliative care suggested by the surgeons, pt appears overwhelmed with his prognosis at this time and requesting time to think about his options and would like to get in touch with his family.   DVT prophylaxis: scd's Code Status: full code.  Family Communication: none at bedside.  Disposition Plan: SW to assist with disposition    Consultants:   ID Dr Drue Second  Cardiothoracic surgery  Cardiology Dr Duke Salvia  Vascular surgery Dr Imogene Burn.    Procedures: TTE on 5/16.   Antimicrobials: PENICILLIN G   Subjective: Reports pain in the right hand with some redness in the palmer aspect.   Objective: Vitals:   01/22/18 1230 01/22/18 2003 01/23/18 0513 01/23/18 1300  BP: (!) 89/51 (!) 91/46 (!) 96/55 (!) 97/55  Pulse: 76   (!) 105  Resp: Temp: 98.7 F (37.1 C) 98.6 F (37 C) 98.8 F (37.1 C) 99.1 F (37.3 C)   TempSrc: Oral Oral Oral Oral  SpO2: 98%   98%  Weight:   61.9 kg (136 lb 8 oz)   Height:        Intake/Output Summary (Last 24 hours) at 01/23/2018 1622 Last data filed at 01/23/2018 1300 Gross per 24 hour  Intake 480 ml  Output -  Net 480 ml   Filed Weights   01/20/18 0309 01/21/18 0607 01/23/18 0513  Weight: 60.7 kg (133 lb 12.8 oz) 60.4 kg (133 lb 3.2 oz) 61.9 kg (136 lb 8 oz)    Examination:  General exam: Appears calm not in distress. Not on oxygen.  Respiratory system: good air entry , no wheezing or rhonchi.  Cardiovascular system: S1 & S2 heard, RRR. No JVD,  Systolic murmer present.  No pedal edema. Gastrointestinal system: Abdomen is soft mildly tender generalized today. No distention. Bowel sounds heard.  Central nervous system: Alert and oriented. Non focal. Extremities: no pedal edema, some redness in the right palmar aspect. No cyanosis or clubbing.  Skin: No rashes, lesions or ulcers Psychiatry:  Affect is flat.     Data Reviewed: I have personally reviewed following labs and imaging studies  CBC: Recent Labs  Lab 01/17/18 0355 01/18/18 0342 01/19/18 0339 01/21/18 1232  WBC 10.9* 10.4 10.7* 12.6*  HGB 8.6* 8.1* 8.2* 8.9*  HCT 28.7* 27.7* 27.8* 30.2*  MCV 88.9 88.5 90.0 90.4  PLT 537* 524* 522* 500*   Basic Metabolic Panel: Recent Labs  Lab 01/17/18 0355 01/18/18 0342 01/19/18 0339 01/21/18 1232  NA 140 139 141 139  K 3.9 4.2 4.2 4.0  CL 106 104 105 101  CO2 GLUCOSE 103* 94 97 136*  BUN 23*  CREATININE 0.83 0.72 0.68 0.79  CALCIUM 8.3* 8.6* 8.4* 8.6*  MG 1.8  --   --   --   PHOS 2.9  --   --   --    GFR: Estimated Creatinine Clearance: 107.5 mL/min (by C-G formula based on SCr of 0.79 mg/dL). Liver Function Tests: Recent Labs  Lab 01/17/18 0355 01/18/18 0342 01/19/18 0339  AST 33 43* 54*  ALT 34 42 54  ALKPHOS 61 59 72  BILITOT 0.5 0.4 0.4  PROT 6.8 7.1 6.7  ALBUMIN 1.8* 1.9* 1.8*   No results for  input(s): LIPASE, AMYLASE in the last 168 hours. No results for input(s): AMMONIA in the last 168 hours. Coagulation Profile: Recent Labs  Lab 01/17/18 1200 01/18/18 0342  INR 1.18 1.18   Cardiac Enzymes: No results for input(s): CKTOTAL, CKMB, CKMBINDEX, TROPONINI in the last 168 hours. BNP (last 3 results) No results for input(s): PROBNP in the last 8760 hours. HbA1C: No results for input(s): HGBA1C in the last 72 hours. CBG: No results for input(s): GLUCAP in the last 168 hours. Lipid Profile: No results for input(s): CHOL, HDL, LDLCALC, TRIG, CHOLHDL, LDLDIRECT in the last 72 hours. Thyroid Function Tests: No results  for input(s): TSH, T4TOTAL, FREET4, T3FREE, THYROIDAB in the last 72 hours. Anemia Panel: No results for input(s): VITAMINB12, FOLATE, FERRITIN, TIBC, IRON, RETICCTPCT in the last 72 hours. Sepsis Labs: No results for input(s): PROCALCITON, LATICACIDVEN in the last 168 hours.  Recent Results (from the past 240 hour(s))  Blood culture (routine x 2)     Status: None   Collection Time: 01/14/18 10:29 AM  Result Value Ref Range Status   Specimen Description BLOOD RIGHT ANTECUBITAL  Final   Special Requests   Final    BOTTLES DRAWN AEROBIC AND ANAEROBIC Blood Culture adequate volume   Culture   Final    NO GROWTH 5 DAYS Performed at [facility], [address] [place], [place] [address]    Report Status 01/19/2018 FINAL  Final  Blood culture (routine x 2)     Status: None   Collection Time: 01/14/18 11:05 AM  Result Value Ref Range Status   Specimen Description BLOOD RIGHT ANTECUBITAL  Final   Special Requests   Final    BOTTLES DRAWN AEROBIC AND ANAEROBIC Blood Culture adequate volume   Culture   Final    NO GROWTH 5 DAYS Performed at [facility], [address], [place], [place] [address]    Report Status 01/19/2018 FINAL  Final  MRSA PCR Screening     Status: Abnormal   Collection Time: 01/14/18  3:46 PM  Result Value Ref Range Status    MRSA by PCR POSITIVE (A) NEGATIVE Final    Comment:        The GeneXpert MRSA Assay (FDA approved for NASAL specimens only), is one component of a comprehensive MRSA colonization surveillance program. It is not intended to diagnose MRSA infection nor to guide or monitor treatment for MRSA infections. RESULT CALLED TO, READ BACK BY AND VERIFIED WITH: R.PATERNOSTER,RN AT 1804 BY L.PITT 01/14/18          Radiology Studies: No results found.      Scheduled Meds: . buprenorphine-naloxone  1 tablet Sublingual Daily  . famotidine  20 mg Oral BID  . feeding supplement (ENSURE ENLIVE)  237 mL Oral TID PC & HS  . senna-docusate  1 tablet Oral BID   Continuous Infusions: . pencillin G potassium IV 4 Million Units (01/23/18 1618)     LOS: 9 days    Time spent: 35 minutes    Kathlen Mody, MD [facility] Pager [phone]   If 7PM-7AM, please contact night-coverage [email or web address] Password [email or web address] 01/23/2018, 4:22 PM

## 2018-01-23 NOTE — Plan of Care (Signed)
  Problem: Pain Managment: Goal: General experience of comfort will improve Outcome: Progressing   Problem: Safety: Goal: Ability to remain free from injury will improve Outcome: Progressing   Problem: Education: Goal: Knowledge of disease or condition will improve Outcome: Progressing   Problem: Coping: Goal: Will verbalize positive feelings about self Outcome: Progressing

## 2018-01-23 NOTE — Progress Notes (Signed)
Progress Note  Patient Name: Marcus [name] Date of Encounter: 01/23/2018  Primary Cardiologist: Dr. Chilton Si  Subjective   No chest pain or shortness of breath at rest.  Ate about half of his lunch.  Inpatient Medications    Scheduled Meds: . buprenorphine-naloxone  1 tablet Sublingual Daily  . famotidine  20 mg Oral BID  . feeding supplement (ENSURE ENLIVE)  237 mL Oral TID PC & HS  . senna-docusate  1 tablet Oral BID   Continuous Infusions: . pencillin G potassium IV 4 Million Units (01/23/18 1212)   PRN Meds: acetaminophen, iohexol   Vital Signs    Vitals:   01/22/18 0409 01/22/18 1230 01/22/18 2003 01/23/18 0513  BP: (!) 85/54 (!) 89/51 (!) 91/46 (!) 96/55  Pulse:  76    Resp: Temp: 98.3 F (36.8 C) 98.7 F (37.1 C) 98.6 F (37 C) 98.8 F (37.1 C)  TempSrc: Oral Oral Oral Oral  SpO2:  98%    Weight:    136 lb 8 oz (61.9 kg)  Height:        Intake/Output Summary (Last 24 hours) at 01/23/2018 1248 Last data filed at 01/23/2018 0900 Gross per 24 hour  Intake 240 ml  Output -  Net 240 ml   Filed Weights   01/20/18 0309 01/21/18 0607 01/23/18 0513  Weight: 133 lb 12.8 oz (60.7 kg) 133 lb 3.2 oz (60.4 kg) 136 lb 8 oz (61.9 kg)    Telemetry    Currently not on telemetry.  ECG    Tracing from 01/22/2018 showed sinus rhythm.  Personally reviewed.  Physical Exam   GEN:  Chronically ill-appearing, no acute distress.   Neck: No JVD. Cardiac: RRR, 3/6 apical systolic murmur, no gallop.  Respiratory: Nonlabored. Clear to auscultation bilaterally. GI: Soft, nontender, bowel sounds present. MS: No edema; No deformity. Neuro:  Nonfocal. Psych: Alert and oriented x 3. Normal affect.  Labs    Chemistry Recent Labs  Lab 01/17/18 0355 01/18/18 0342 01/19/18 0339 01/21/18 1232  NA 140 139 141 139  K 3.9 4.2 4.2 4.0  CL 106 104 105 101  CO2 GLUCOSE 103* 94 97 136*  BUN 23*  CREATININE 0.83 0.72 0.68 0.79    CALCIUM 8.3* 8.6* 8.4* 8.6*  PROT 6.8 7.1 6.7  --   ALBUMIN 1.8* 1.9* 1.8*  --   AST 33 43* 54*  --   ALT 34 42 54  --   ALKPHOS 61 59 72  --   BILITOT 0.5 0.4 0.4  --   GFRNONAA >60 >60 >60 >60  GFRAA >60 >60 >60 >60  ANIONGAP Hematology Recent Labs  Lab 01/18/18 0342 01/19/18 0339 01/21/18 1232  WBC 10.4 10.7* 12.6*  RBC 3.13* 3.09* 3.34*  HGB 8.1* 8.2* 8.9*  HCT 27.7* 27.8* 30.2*  MCV 88.5 90.0 90.4  MCH 25.9* 26.5 26.6  MCHC 29.2* 29.5* 29.5*  RDW 16.4* 16.5* 17.0*  PLT 524* 522* 500*    Radiology    No results found.  Cardiac Studies   Echocardiogram 01/14/2018: Study Conclusions  - Left ventricle: The cavity size was severely dilated. Systolic   function was normal. The estimated ejection fraction was in the   range of 55% to 60%. Wall motion was normal; there were no   regional wall motion abnormalities. Left ventricular diastolic   function parameters were normal. - Aortic  valve: Transvalvular velocity was within the normal range.   There was no stenosis. There was no regurgitation. - Mitral valve: There was a medium-sized, 1.4 cm (L) x 0.4 cm (W),   mobile vegetation on the posterior leaflet. There was severe   regurgitation directed posteriorly. - Left atrium: The atrium was severely dilated. - Right ventricle: The cavity size was normal. Wall thickness was   normal. Systolic function was normal. - Tricuspid valve: There was mild regurgitation. - Pulmonary arteries: Systolic pressure was mildly increased. PA   peak pressure: 42 mm Hg (S).  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 Consider TEE to better   evaluate if clinically indicated.  Patient Profile     42 y.o. male with a history of IV drug abuse, hepatitis B and C, bipolar disorder, and chronic back pain.  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  He has had evidence of stroke by neuro imaging with some hemorrhagic conversion.  Assessment & Plan    1.  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  Followed by the infectious disease team on directed antibiotic therapy.  He is currently afebrile.  He was assessed by Dr. Cornelius Moras with TCTS and felt not to be an appropriate surgical candidate  2.  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  He was assessed by Dr. Imogene Burn with vascular surgery and felt not to be an appropriate surgical candidate.  3.  History of polysubstance abuse.  He is currently on Suboxone.  4.  Septic emboli including multifocal subacute bilateral MCA/watershed territory infarcts and right frontal high convexity small hemorrhage.  Overall very poor prognosis, he continues on antibiotic therapy.  Multiple consultant services have recommended Palliative Care consultation.  Of note, he is scheduled for TEE on Monday, although it is not entirely clear to me that this would be necessary if surgical intervention is not planned.  Signed, Nona Dell, MD  01/23/2018, 12:48 PM

## 2018-01-23 NOTE — Progress Notes (Signed)
Preliminary notes-- VASCULAR LAB PRELIMINARY  ARTERIAL  ABI completed:    RIGHT    LEFT    PRESSURE WAVEFORM  PRESSURE WAVEFORM  BRACHIAL 96 Monophasic BRACHIAL 100 biphasic  DP 98 irregular monophasic DP 57 irregular monophasic  AT   AT    PT 51 irregular monophasic PT 40 irregular monophasic  PER   PER    GREAT TOE  NA GREAT TOE  NA    RIGHT LEFT  ABI 0.98 0.57   Patient could not keep still during the entire exam. ABI on today's study not reliable,patient could not coorperate.  HONGYING  Makel Mcmann, RVT 01/23/2018, 10:26 AM

## 2018-01-24 ENCOUNTER — Inpatient Hospital Stay (HOSPITAL_COMMUNITY): Payer: Medicaid Other

## 2018-01-24 LAB — CBC
HCT: 27.5 % — ABNORMAL LOW (ref 39.0–52.0)
Hemoglobin: 8.3 g/dL — ABNORMAL LOW (ref 13.0–17.0)
MCH: 26.9 pg (ref 26.0–34.0)
MCHC: 30.2 g/dL (ref 30.0–36.0)
MCV: 89.3 fL (ref 78.0–100.0)
Platelets: 450 K/uL — ABNORMAL HIGH (ref 150–400)
RBC: 3.08 MIL/uL — ABNORMAL LOW (ref 4.22–5.81)
RDW: 17.1 % — ABNORMAL HIGH (ref 11.5–15.5)
WBC: 10.7 K/uL — ABNORMAL HIGH (ref 4.0–10.5)

## 2018-01-24 LAB — BASIC METABOLIC PANEL
Anion gap: 8 (ref 5–15)
BUN: 21 mg/dL — ABNORMAL HIGH (ref 6–20)
CO2: 31 mmol/L (ref 22–32)
Calcium: 8.6 mg/dL — ABNORMAL LOW (ref 8.9–10.3)
Chloride: 97 mmol/L — ABNORMAL LOW (ref 101–111)
Creatinine, Ser: 0.62 mg/dL (ref 0.61–1.24)
GFR calc Af Amer: >60 mL/min (ref >60)
GLUCOSE: 79 mg/dL (ref 65–99)
Potassium: 4.1 mmol/L (ref 3.5–5.1)
Sodium: 136 mmol/L (ref 135–145)

## 2018-01-24 NOTE — Progress Notes (Signed)
Progress Note  Patient Name: Marcus [name] Date of Encounter: 01/24/2018  Primary Cardiologist: Dr. Elmarie Shiley Randoplh  Subjective   No active chest pain or breathlessness at rest.  Inpatient Medications    Scheduled Meds: . buprenorphine-naloxone  1 tablet Sublingual Daily  . famotidine  20 mg Oral BID  . feeding supplement (ENSURE ENLIVE)  237 mL Oral TID PC & HS  . senna-docusate  1 tablet Oral BID   Continuous Infusions: . pencillin G potassium IV Stopped (01/24/18 0713)   PRN Meds: acetaminophen, iohexol   Vital Signs    Vitals:   01/23/18 1300 01/23/18 2049 01/24/18 0519 01/24/18 0837  BP: (!) 95/62  Pulse: (!) 105  93 94  Resp: Temp: 99.1 F (37.3 C) 98.3 F (36.8 C) 98.3 F (36.8 C)   TempSrc: Oral Oral Oral   SpO2: 98% 97% 100% 99%  Weight:   137 lb (62.1 kg)   Height:        Intake/Output Summary (Last 24 hours) at 01/24/2018 1015 Last data filed at 01/24/2018 0500 Gross per 24 hour  Intake 1440 ml  Output 1750 ml  Net -310 ml   Filed Weights   01/21/18 0607 01/23/18 0513 01/24/18 0519  Weight: 133 lb 3.2 oz (60.4 kg) 136 lb 8 oz (61.9 kg) 137 lb (62.1 kg)    Telemetry    Currently not on telemetry.  Personally reviewed.  ECG    Tracing from 01/22/2018 showed sinus rhythm.  Personally reviewed.  Physical Exam   GEN:  Chronically ill-appearing male, no acute distress.   Neck: No JVD. Cardiac: RRR, 3/6 apical systolic murmur, no gallop.  Respiratory: Nonlabored. Clear to auscultation bilaterally. GI: Soft, nontender, bowel sounds present. MS: No edema; No deformity. Neuro:  Nonfocal. Psych: Alert and oriented x 3. Normal affect.  Labs    Chemistry Recent Labs  Lab 01/18/18 0342 01/19/18 0339 01/21/18 1232  NA 139 141 139  K 4.2 4.2 4.0  CL 104 105 101  CO2 GLUCOSE 94 97 136*  BUN 15 20 23*  CREATININE 0.72 0.68 0.79  CALCIUM 8.6* 8.4* 8.6*  PROT 7.1 6.7  --   ALBUMIN 1.9* 1.8*  --     AST 43* 54*  --   ALT 42 54  --   ALKPHOS 59 72  --   BILITOT 0.4 0.4  --   GFRNONAA >60 >60 >60  GFRAA >60 >60 >60  ANIONGAP Hematology Recent Labs  Lab 01/18/18 0342 01/19/18 0339 01/21/18 1232  WBC 10.4 10.7* 12.6*  RBC 3.13* 3.09* 3.34*  HGB 8.1* 8.2* 8.9*  HCT 27.7* 27.8* 30.2*  MCV 88.5 90.0 90.4  MCH 25.9* 26.5 26.6  MCHC 29.2* 29.5* 29.5*  RDW 16.4* 16.5* 17.0*  PLT 524* 522* 500*    Radiology    No results found.  Cardiac Studies   Echocardiogram 01/14/2018: Study Conclusions  - Left ventricle: The cavity size was severely dilated. Systolic function was normal. The estimated ejection fraction was in the range of 55% to 60%. Wall motion was normal; there were no regional wall motion abnormalities. Left ventricular diastolic function parameters were normal. - Aortic valve: Transvalvular velocity was within the normal range. There was no stenosis. There was no regurgitation. - Mitral valve: There was a medium-sized, 1.4 cm (L) x 0.4 cm (W), mobile vegetation on the posterior leaflet. There was severe regurgitation directed  posteriorly. - Left atrium: The atrium was severely dilated. - Right ventricle: The cavity size was normal. Wall thickness was normal. Systolic function was normal. - Tricuspid valve: There was mild regurgitation. - Pulmonary arteries: Systolic pressure was mildly increased. PA peak pressure: 42 mm Hg (S).  Impressions:  - Findings consisent with endocarditis of the mitral valve with associated severe mitral regurgitation. Consider TEE to better evaluate if clinically indicated.  Patient Profile     42 y.o. male with a history of IV drug abuse, hepatitis B and C, bipolar disorder, and chronic back pain.  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  He has had evidence of  stroke by neuro imaging with some hemorrhagic conversion.  Assessment & Plan    1.  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  Patient continues on antibiotic therapy per infectious disease service and is currently afebrile.  He was evaluated by Dr. Cornelius Moras with TCTS and felt not to be an appropriate surgical candidate.  2.  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  He was evaluated by Dr. Imogene Burn with VVS and felt not to be an appropriate surgical candidate.  3.  History of polysubstance abuse.  Currently on Suboxone.  4.  Septic emboli including multifocal subacute bilateral MCA/watershed territory infarcts and right frontal high convexity small hemorrhage.  Patient continues on penicillin G per infectious disease service.  He is afebrile without major change in clinical status.  Overall prognosis is very poor, Palliative Care consultation recommended to discuss treatment goals.  Signed, Nona Dell, MD  01/24/2018, 10:15 AM

## 2018-01-24 NOTE — Progress Notes (Signed)
PROGRESS NOTE    Marcus [name]  ZOX:[id number] DOB: August 02, 1976 DOA: 01/14/2018 PCP: [facility]   Brief Narrative: 41yo homeless man w/ a hx of polysubstance abuse, Hep B and Hep C, and depression/SI who was diagnosed with endocarditis in April 2019 and treated originally at [facility]. 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  He had been on anticoagualtion with coumadin. He presented to the [facility] ED w/ c/o back pain, and was found to be encephalopathic. He underwent TTE showing a 1.4 cm mobile vegetation on the posterior leaflet on the MV with severe regurgitation.     Assessment & Plan:   Principal Problem:   Endocarditis of mitral valve Active Problems:   Septic embolism (HCC)   Cerebral embolism with cerebral infarction   Aortic occlusion (HCC)   Nontraumatic cortical hemorrhage of right cerebral hemisphere [facility])   Chronic viral hepatitis B without delta-agent (HCC)   Chronic viral hepatitis C (HCC)   Back pain   Aneurysm (HCC)   Severe mitral regurgitation   Streptococcal endocarditis involving the MV: With subsequent septic emboli to the brain and spleen, kidney.  Blood cultures this admit negative so far.  Current only penicillin IV as per ID this admission.  Not a surgical candidate for MVR as per cardiothoracic surgery.     CT angio of the abdomen with and without contrast showed  There is abrupt occlusion of the aorta below the level of the IMA as described on the prior report. Mixed density within the aorta suggest subacute age. Also, there is thickening and inflammatory change of the wall of the aorta suggesting vasculitis or mycotic aneurysm.There is also associated occlusion of the bilateral common iliac arteries.Focal occlusion of the splenic artery. VASCULAR surgery suggested pt not a  candidate for surgery.      MCA infarcts due to septic emboli:  No further work up as per neurology.  MRI Brain: Multifocal subacute bilateral MCA/watershed territory infarcts and right frontal high convexity small hemorrhage. No  Indication of anti coagulation as per neurology.  Recommend outpatient follow up with stroke clinic NP at [facility] in about 4 weeks.  If anti coagulation is needed , we need to obtain a repeat CT head without contrast to rule out worsening ICH. If the ICH is stable ,then he can be started on IV heparin as per Dr Roda Shutters with neurology.    Polysubstance abuse:  Pt reports he was on Suboxone previously and requested to be restarted on it.  On 8 mg SL suboxone.    Homeless ness: CSW consulted.    Anemia of  chronic disease Hemoglobin stable around 8.    Thrombocytosis suspect from the infection.  Platelets stable around 500,000   Leukocytosis:  Pt remains afebrile. Blood cultures negative so far.    In view of his poor prognosis, palliative care suggested by the surgeons,  Will order palliative care for goals of care.   DVT prophylaxis: scd's Code Status: full code.  Family Communication: none at bedside.  Disposition Plan: SW to assist with disposition    Consultants:   ID Dr Drue Second  Cardiothoracic surgery   Cardiology Dr Duke Salvia  Vascular surgery Dr Imogene Burn.    Procedures: TTE on 5/16.   Antimicrobials: PENICILLIN G   Subjective: No new complaints at this time.  Objective: Vitals:   01/23/18 2049 01/24/18 0519 01/24/18 0837 01/24/18 1220  BP: (!) 94/52  Pulse:  93 94 (!) 101  Resp: Temp: 98.3 F (36.8 C) 98.3 F (36.8 C)  98.3 F (36.8 C)  TempSrc: Oral Oral  Oral  SpO2: 97% 100% 99% 99%  Weight:  62.1 kg (137 lb)    Height:        Intake/Output Summary (Last 24 hours) at 01/24/2018 1605 Last data filed at 01/24/2018 0500 Gross per 24 hour  Intake 1200 ml  Output 1750 ml  Net -550 ml   Filed  Weights   01/21/18 0607 01/23/18 0513 01/24/18 0519  Weight: 60.4 kg (133 lb 3.2 oz) 61.9 kg (136 lb 8 oz) 62.1 kg (137 lb)    Examination:  General exam: Appears calm not in distress. Not on oxygen.  Respiratory system: air entry fair, no wheezing or rhonchi.  Cardiovascular system: S1 & S2 heard, RRR. No JVD,  Systolic murmer present.  No pedal edema. Gastrointestinal system: Abdomen is soft non tende non distended bowel sounds heard.  Central nervous system: Alert and oriented. Non focal. Extremities: no pedal edema, some redness in the right palmar aspect. No cyanosis or clubbing.  Skin: No rashes, lesions or ulcers Psychiatry:  Affect is flat.     Data Reviewed: I have personally reviewed following labs and imaging studies  CBC: Recent Labs  Lab 01/18/18 0342 01/19/18 0339 01/21/18 1232  WBC 10.4 10.7* 12.6*  HGB 8.1* 8.2* 8.9*  HCT 27.7* 27.8* 30.2*  MCV 88.5 90.0 90.4  PLT 524* 522* 500*   Basic Metabolic Panel: Recent Labs  Lab 01/18/18 0342 01/19/18 0339 01/21/18 1232  NA 139 141 139  K 4.2 4.2 4.0  CL 104 105 101  CO2 GLUCOSE 94 97 136*  BUN 15 20 23*  CREATININE 0.72 0.68 0.79  CALCIUM 8.6* 8.4* 8.6*   GFR: Estimated Creatinine Clearance: 107.8 mL/min (by C-G formula based on SCr of 0.79 mg/dL). Liver Function Tests: Recent Labs  Lab 01/18/18 0342 01/19/18 0339  AST 43* 54*  ALT 42 54  ALKPHOS 59 72  BILITOT 0.4 0.4  PROT 7.1 6.7  ALBUMIN 1.9* 1.8*   No results for input(s): LIPASE, AMYLASE in the last 168 hours. No results for input(s): AMMONIA in the last 168 hours. Coagulation Profile: Recent Labs  Lab 01/18/18 0342  INR 1.18   Cardiac Enzymes: No results for input(s): CKTOTAL, CKMB, CKMBINDEX, TROPONINI in the last 168 hours. BNP (last 3 results) No results for input(s): PROBNP in the last 8760 hours. HbA1C: No results for input(s): HGBA1C in the last 72 hours. CBG: No results for input(s): GLUCAP in the last 168  hours. Lipid Profile: No results for input(s): CHOL, HDL, LDLCALC, TRIG, CHOLHDL, LDLDIRECT in the last 72 hours. Thyroid Function Tests: No results for input(s): TSH, T4TOTAL, FREET4, T3FREE, THYROIDAB in the last 72 hours. Anemia Panel: No results for input(s): VITAMINB12, FOLATE, FERRITIN, TIBC, IRON, RETICCTPCT in the last 72 hours. Sepsis Labs: No results for input(s): PROCALCITON, LATICACIDVEN in the last 168 hours.  No results found for this or any previous visit (from the past 240 hour(s)).       Radiology Studies: No results found.      Scheduled Meds: . buprenorphine-naloxone  1 tablet Sublingual Daily  . famotidine  20 mg Oral BID  . feeding supplement (ENSURE ENLIVE)  237 mL Oral TID PC &  HS  . senna-docusate  1 tablet Oral BID   Continuous Infusions: . pencillin G potassium IV Stopped (01/24/18 1215)     LOS: 10 days    Time spent: 35 minutes    Kathlen Mody, MD [facility] Pager [phone]   If 7PM-7AM, please contact night-coverage [email or web address] Password [facility] 01/24/2018, 4:05 PM

## 2018-01-25 DIAGNOSIS — M79605 Pain in left leg: Secondary | ICD-10-CM

## 2018-01-25 DIAGNOSIS — I63419 Cerebral infarction due to embolism of unspecified middle cerebral artery: Secondary | ICD-10-CM

## 2018-01-25 DIAGNOSIS — M79604 Pain in right leg: Secondary | ICD-10-CM

## 2018-01-25 NOTE — Progress Notes (Signed)
[facility]    Date of Admission:  01/14/2018   Total days of antibiotics 12           ID: Marcus [name] is a 42 y.o. male with strep pneumonia native valve endocarditis c/b septic emboli including cerebral embolism with infarct and hemorrhagic conversion plus aortic thrombus with sub-occlusion, chronic HBV and chronic HCV Principal Problem:   Endocarditis of mitral valve Active Problems:   Septic embolism (HCC)   Cerebral embolism with cerebral infarction   Aortic occlusion (HCC)   Nontraumatic cortical hemorrhage of right cerebral hemisphere [facility])   Chronic viral hepatitis B without delta-agent (HCC)   Chronic viral hepatitis C (HCC)   Back pain   Aneurysm (HCC)   Severe mitral regurgitation    Subjective: Feeling anxious would like to smoke (but does not want patch since it makes him worse). He has bilateral leg pains ? Neuropathy like pain  Medications:  . buprenorphine-naloxone  1 tablet Sublingual Daily  . famotidine  20 mg Oral BID  . feeding supplement (ENSURE ENLIVE)  237 mL Oral TID PC & HS  . senna-docusate  1 tablet Oral BID    Objective: Vital signs in last 24 hours: Temp:  [97.5 F (36.4 C)-98.9 F (37.2 C)] 98.9 F (37.2 C) (05/27 1423) Pulse Rate:  [98-109] 98 (05/27 1423) Resp:  [18] 18 (05/27 1423) BP: (97-108)/(54-57) 99/57 (05/27 1423) SpO2:  [93 %-99 %] 99 % (05/27 1423) Weight:  [142 lb 6.7 oz (64.6 kg)] 142 lb 6.7 oz (64.6 kg) (05/27 0617) Physical Exam  Constitutional: He is oriented to person, place, and time. He appears disheveled,and malnourished. No distress.  HENT:  Mouth/Throat: Oropharynx is clear and moist. No oropharyngeal exudate.  Cardiovascular: Normal rate, regular rhythm and normal heart sounds. Systolic murmur Pulmonary/Chest: Effort normal and breath sounds normal. No respiratory distress. He has no wheezes.  Abdominal: Soft. Bowel sounds are normal. He exhibits no distension. There is no tenderness.    Lymphadenopathy:  no cervical adenopathy.  Neurological: He is alert and oriented to person, place, and time.  Skin: Skin is warm and dry. No rash noted. No erythema.  Psychiatric: He has a normal mood and affect. His behavior is normal.     Lab Results Recent Labs    01/24/18 1632  WBC 10.7*  HGB 8.3*  HCT 27.5*  NA 136  K 4.1  CL 97*  CO2 31  BUN 21*  CREATININE 0.62    Microbiology: reviewed Studies/Results: Ct Head Wo Contrast  Result Date: 01/24/2018 CLINICAL DATA:  Continued surveillance infarcts and hemorrhage. EXAM: CT HEAD WITHOUT CONTRAST TECHNIQUE: Contiguous axial images were obtained from the base of the skull through the vertex without intravenous contrast. COMPARISON:  CT head 01/14/2018. MR head 01/14/2018. CT angiogram 01/18/2018. FINDINGS: Brain: Previously identified subacute infarcts are involving into areas of chronic ischemia affecting the BILATERAL RIGHT greater than LEFT subcortical white matter. Tiny focus of hyperattenuation, RIGHT frontal cortex, image 23 appears improved. This measures no more than 2 x 3 mm on today's exam. Vascular: Improved appearance of the previously identified RIGHT M2 occlusion. Some hyperattenuation of vessel wall in the sylvian fissure, see series 3, image 12. Skull: Normal. Negative for fracture or focal lesion. Sinuses/Orbits: No acute finding. Other: None. IMPRESSION: Previously identified subacute white matter infarcts are have evolved into areas of chronic ischemia. Tiny focus of hyperattenuation RIGHT frontal cortex is improved. Improved appearance of the previously identified RIGHT M2 occlusion. Electronically  Signed   By: Elsie Stain M.D.   On: 01/24/2018 17:38     Assessment/Plan: Strep pneumonia mv endocarditis with septic emboli/cns emboli after 3 weeks of treatment in addition to having large aortic septicthrombus concerning for mycotic aneurysm both from aorta and MCA.  - continue on iv penicillin. Would recommend  to treat for a total of 6 wk, then life long amoxicillin. He is at the end of 4th week of treatment - aortic thrombus needs anticoagulation however in the setting of recent stroke, he is being bridged with 7 days of heparin to see if any changes per neurology - given complicated circumstance and morbidity associated with surgical intervention both vascular and CT surgery recommend medical management for the patient  Opiate dependence = currently on daily suboxone. Will ask dr Para March if he thinks patient would need BID dosing  Insight = appears poor insight to his health condition. Continue to discuss with him that he will need to continue his abtx and anticoagulation as outpatient  Will sign off. Call back for questions. Will have appt for patient to see in 2-3 wk  [facility] Cell: [phone] Pager: [phone]  01/25/2018, 5:04 PM

## 2018-01-25 NOTE — Progress Notes (Signed)
PROGRESS NOTE    Marcus [name]  WUJ:[id number] DOB: September 03, 1975 DOA: 01/14/2018 PCP: [facility], [facility]   Brief Narrative: 41yo homeless man w/ a hx of polysubstance abuse, Hep B and Hep C, and depression/SI who was diagnosed with endocarditis in April 2019 and treated originally at [facility]. 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  He had been on anticoagualtion with coumadin. He presented to the [facility] ED w/ c/o back pain, and was found to be encephalopathic. He underwent TTE showing a 1.4 cm mobile vegetation on the posterior leaflet on the MV with severe regurgitation.     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  Blood cultures this admit negative so far.  Currently on penicillin IV as per ID this admission.  Not a surgical candidate for MVR as per cardiothoracic surgery.     CT angio of the abdomen with and without contrast showed  There is abrupt occlusion of the aorta below the level of the IMA as described on the prior report. Mixed density within the aorta suggest subacute age. Also, there is thickening and inflammatory change of the wall of the aorta suggesting vasculitis or mycotic aneurysm.There is also associated occlusion of the bilateral common iliac arteries.Focal occlusion of the splenic artery. VASCULAR surgery suggested pt not a  candidate for surgery.      MCA infarcts due to septic emboli:  No further work up as per neurology.  MRI Brain: Multifocal subacute bilateral MCA/watershed territory infarcts and right frontal high convexity small hemorrhage. No  Indication of anti coagulation as per neurology.  Recommend outpatient follow up with stroke clinic NP at [facility] in about 4 weeks.  If anti coagulation is needed , we need to obtain a repeat CT head without contrast to rule out worsening ICH. If the ICH is stable ,then he can be started on IV heparin as per Dr Roda Shutters with neurology.    Polysubstance abuse:  Pt reports he was on Suboxone previously and requested to be restarted on it.  On 8 mg SL suboxone.    Homeless ness: CSW consulted for possible SNF placement.    Anemia of  chronic disease Hemoglobin stable around 8.    Thrombocytosis suspect from the infection.  Platelets stable around 500,000   Leukocytosis:  Pt remains afebrile. Blood cultures negative so far.    In view of his poor prognosis,  Will order palliative care for goals of care. PT evaluation ordered.   DVT prophylaxis: scd's Code Status: full code.  Family Communication: none at bedside.  Disposition Plan: SW to assist with disposition    Consultants:   ID Dr Drue Second  Cardiothoracic surgery   Cardiology Dr Duke Salvia  Vascular surgery Dr Imogene Burn.    Procedures: TTE on 5/16.   Antimicrobials: PENICILLIN G   Subjective: Pt requesting to get some fresh  air for 10 to 15 min today.   Objective: Vitals:   01/24/18 1700 01/24/18 2151 01/25/18 0617 01/25/18 1423  BP: (!) 97/59 (!) 108/55 (!) 97/54 (!) 99/57  Pulse: 92 (!) 109 98 98  Resp: Temp: 98 F (36.7 C) (!) 97.5 F (36.4 C) 97.7 F (36.5 C) 98.9 F (37.2 C)  TempSrc: Oral Oral Oral Oral  SpO2: 99% 93% 98% 99%  Weight:   64.6 kg (142 lb 6.7 oz)   Height:        Intake/Output Summary (Last 24 hours) at 01/25/2018 1547 Last data filed at 01/25/2018  1425 Gross per 24 hour  Intake 1460 ml  Output 1500 ml  Net -40 ml   Filed Weights   01/23/18 0513 01/24/18 0519 01/25/18 0617  Weight: 61.9 kg (136 lb 8 oz) 62.1 kg (137 lb) 64.6 kg (142 lb 6.7 oz)    Examination:  General exam: Appears calm and comfortable.  Respiratory system: good air entry , bilateral. No wheezing or rhonchi.  Cardiovascular system: S1 & S2 heard, RRR. No JVD,  Systolic murmer present.  No pedal edema. Gastrointestinal system: Abdomen is soft NT nd bs+ Central nervous system: Alert and oriented. Non focal. Extremities: no pedal edema, some redness in the right palmar aspect. No cyanosis or clubbing.  Skin: No rashes, lesions or ulcers Psychiatry:  Affect is flat.     Data Reviewed: I have personally reviewed following labs and imaging studies  CBC: Recent Labs  Lab 01/19/18 0339 01/21/18 1232 01/24/18 1632  WBC 10.7* 12.6* 10.7*  HGB 8.2* 8.9* 8.3*  HCT 27.8* 30.2* 27.5*  MCV 90.0 90.4 89.3  PLT 522* 500* 450*   Basic Metabolic Panel: Recent Labs  Lab 01/19/18 0339 01/21/18 1232 01/24/18 1632  NA 141 139 136  K 4.2 4.0 4.1  CL 105 101 97*  CO2 GLUCOSE 97 136* 79  BUN 20 23* 21*  CREATININE 0.68 0.79 0.62  CALCIUM 8.4* 8.6* 8.6*   GFR: Estimated Creatinine Clearance: 112.2 mL/min (by C-G formula based on SCr of 0.62 mg/dL). Liver Function Tests: Recent Labs  Lab 01/19/18 0339  AST 54*  ALT 54  ALKPHOS 72  BILITOT 0.4  PROT 6.7  ALBUMIN 1.8*   No results for input(s): LIPASE, AMYLASE in the last 168 hours. No results for input(s): AMMONIA in the last 168 hours. Coagulation Profile: No results for input(s): INR, PROTIME in the last 168 hours. Cardiac Enzymes: No results for input(s): CKTOTAL, CKMB, CKMBINDEX, TROPONINI in the last 168 hours. BNP (last 3 results) No results for input(s): PROBNP in the last 8760 hours. HbA1C: No results for input(s): HGBA1C in the last 72 hours. CBG: No results for input(s):  GLUCAP in the last 168 hours. Lipid Profile: No results for input(s): CHOL, HDL, LDLCALC, TRIG, CHOLHDL, LDLDIRECT in the last 72 hours. Thyroid Function Tests: No results for input(s): TSH, T4TOTAL, FREET4, T3FREE, THYROIDAB in the last 72 hours. Anemia Panel: No results for input(s): VITAMINB12, FOLATE, FERRITIN, TIBC, IRON, RETICCTPCT in the last 72 hours. Sepsis Labs: No results for input(s): PROCALCITON, LATICACIDVEN in the last 168 hours.  No results found for this or any previous visit (from the past 240 hour(s)).       Radiology Studies: Ct Head Wo Contrast  Result Date: 01/24/2018 CLINICAL DATA:  Continued surveillance infarcts and hemorrhage. EXAM: CT HEAD WITHOUT CONTRAST TECHNIQUE: Contiguous axial images were obtained from the base of the skull through  the vertex without intravenous contrast. COMPARISON:  CT head 01/14/2018. MR head 01/14/2018. CT angiogram 01/18/2018. FINDINGS: Brain: Previously identified subacute infarcts are involving into areas of chronic ischemia affecting the BILATERAL RIGHT greater than LEFT subcortical white matter. Tiny focus of hyperattenuation, RIGHT frontal cortex, image 23 appears improved. This measures no more than 2 x 3 mm on today's exam. Vascular: Improved appearance of the previously identified RIGHT M2 occlusion. Some hyperattenuation of vessel wall in the sylvian fissure, see series 3, image 12. Skull: Normal. Negative for fracture or focal lesion. Sinuses/Orbits: No acute finding. Other: None. IMPRESSION: Previously identified subacute white matter infarcts are have evolved into areas of chronic ischemia. Tiny focus of hyperattenuation RIGHT frontal cortex is improved. Improved appearance of the previously identified RIGHT M2 occlusion. Electronically Signed   By: Elsie Stain M.D.   On: 01/24/2018 17:38        Scheduled Meds: . buprenorphine-naloxone  1 tablet Sublingual Daily  . famotidine  20 mg Oral BID  . feeding supplement  (ENSURE ENLIVE)  237 mL Oral TID PC & HS  . senna-docusate  1 tablet Oral BID   Continuous Infusions: . pencillin G potassium IV 4 Million Units (01/25/18 1255)     LOS: 11 days    Time spent: 35 minutes    Kathlen Mody, MD [facility] Pager [phone]   If 7PM-7AM, please contact night-coverage [email or web address] Password [facility] 01/25/2018, 3:47 PM

## 2018-01-25 NOTE — Plan of Care (Signed)
  Problem: Safety: Goal: Ability to remain free from injury will improve Outcome: Progressing   Problem: Education: Goal: Knowledge of disease or condition will improve Outcome: Progressing   Problem: Coping: Goal: Will identify appropriate support needs Outcome: Progressing   Problem: Nutrition: Goal: Risk of aspiration will decrease Outcome: Progressing

## 2018-01-26 ENCOUNTER — Encounter (HOSPITAL_COMMUNITY): Payer: Self-pay | Admitting: Anesthesiology

## 2018-01-26 ENCOUNTER — Encounter (HOSPITAL_COMMUNITY)
Admission: EM | Disposition: A | Discharge status: Home / Self Care | Payer: Self-pay | Source: Home / Self Care | Attending: Internal Medicine

## 2018-01-26 DIAGNOSIS — F329 Major depressive disorder, single episode, unspecified: Secondary | ICD-10-CM

## 2018-01-26 DIAGNOSIS — F32A Depression, unspecified: Secondary | ICD-10-CM

## 2018-01-26 DIAGNOSIS — I63411 Cerebral infarction due to embolism of right middle cerebral artery: Secondary | ICD-10-CM

## 2018-01-26 DIAGNOSIS — Z515 Encounter for palliative care: Secondary | ICD-10-CM

## 2018-01-26 DIAGNOSIS — Z7189 Other specified counseling: Secondary | ICD-10-CM

## 2018-01-26 DIAGNOSIS — F3289 Other specified depressive episodes: Secondary | ICD-10-CM

## 2018-01-26 SURGERY — MRI WITH ANESTHESIA
Anesthesia: General

## 2018-01-26 SURGERY — ECHOCARDIOGRAM, TRANSESOPHAGEAL
Anesthesia: Monitor Anesthesia Care

## 2018-01-26 MED ORDER — BUPROPION HCL ER (XL) 150 MG PO TB24
150.0000 mg | ORAL_TABLET | Freq: Every day | ORAL | Status: DC
  Administered 2018-01-26 – 2018-02-09 (×15): 150 mg via ORAL
  Filled 2018-01-26 (×15): qty 1

## 2018-01-26 NOTE — Consult Note (Signed)
Consultation Note Date: 01/26/2018   Patient Name: Marcus [name]  DOB: [birth year]  MRN: [id number]  Age / Sex: 42 y.o., male  PCP: [facility] Referring Physician: Hosie Poisson, MD  Reason for Consultation: Establishing goals of care  HPI/Patient Profile: 42 y.o. male  with past medical history of endocarditis with septic embolisms (dx April 2-19)- abdominal aortic occlusion with splenic and renal infarcts, depression w/ SI, bipolar d/o, IV drug use, Hep B, Hep C admitted on 01/14/2018 with altered mental status, and SOB. 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 Not surgical candidate. Palliative medicine consulted for [place].    Clinical Assessment and Goals of Care: Met with patient at bedside.  He is from [place]- I was able to get his mother's name and phone number and entered it into demographics- if he became unable to make decisions- she would be his Next of Kin.  Patient reports that he moved here to go to school. 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 He lost his job due to drug abuse.  He has worked and previously enjoyed Environmental consultant and motor cross. Being active and thrill seeking activities have always been important to him.  We reviewed his current illness state and possible trajectories.  I attempted to explain it to him in very simple terms. He tells me that he "winged it" through school and that he will tell people he understands things when he doesn't.  After discussing his health situation he tells me that he is scared.  We discussed his goals of care and possible EOL wishes. He would not want his life prolonged by extraordinary measures including long term  ventilator support or artificial feeding. We further discussed code status. 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 He says he would give it to anyone who needs it, but given all his health problems, he does not feel that it would be helpful to him if he were to experience cardiac arrest. He has been suicidal in the past, and does experience depression, but I do not think this affects his decision. He states that he is weak now, and he understands this is a result of his illness. He says he has struggled most of his life, and if he died, he would not want to be brought back only to have to struggle more. He is interested in continuing antibiotics and pursuing placement in a nursing facility for rehab and seeking counseling for drug rehab. We further discussed advanced healthcare planning pertaining to what his wishes would be if he experienced hemorrhage or stroke that left him disabled and unable to communicate. He said, "If I fall out, and I can't get up and go, just make me comfortable". He said that even his mother knows what kind of lifestyle he  would want- he would not want to be permanantly disabled, bedbound. He would rather die comfortably. He tells me he fears dying in pain.    A copy of Advance Directives were given to him to review.  He was previously on Wellbutrin 172m daily for depression and he would like to resume this. He has been suicidal in the past- he is not suicidal now.  Primary Decision Maker PATIENT   SUMMARY OF RECOMMENDATIONS -DNR- with continued full scope of care -D/C to facility for skilled rehab  -Wellbutrin 1578mdaily -Chaplain consult for advance directives completion    Code Status/Advance Care Planning:  DNR  Palliative Prophylaxis:   Delirium Protocol  Additional Recommendations (Limitations, Scope, Preferences):  Full Scope Treatment  Psycho-social/Spiritual:   Desire for further Chaplaincy support:yes  Additional  Recommendations: Referral to Community Resources   Prognosis:    Unable to determine -patient at high risk for sudden death d/t mycotic aneurysm rupture, also poor prognosis d/t aortic occlusion, infective endocarditis with high risk for poor compliance to treatment plan if he is unable to d/c to SNF  [facility] care service [facility]  Primary Diagnoses: Present on Admission: . Septic embolism ([facility]  I have reviewed the medical record, interviewed the patient and family, and examined the patient. The following aspects are pertinent.  Past Medical History:  Diagnosis Date  . [facility]   . [place] ([facility]   [facility]  . Renal infarction ([facility] [place]04/30/2019   [facility]   [facility]  . [facility]   [facility] Social History   Socioeconomic History  . Marital status: Single    Spouse name: Not on file  . Number of children: Not on file  . Years of education: Not on file  . Highest education level: Not on file  Occupational History  . Occupation: Unemployed  Social Needs  . Financial resource strain: Not on file  . Food insecurity:    Worry: Not on file    Inability: Not on file  . Transportation needs:    Medical: Not on file    Non-medical: Not on file  Tobacco Use  . Smoking status: Current Every Day Smoker  . Smokeless tobacco: Never Used  Substance and Sexual Activity  . Alcohol use: No  . Drug use: Yes    Frequency: 7.0 times per week    Types: Methamphetamines, Heroin    Comment: Daily use where possible  . Sexual activity: Not Currently  Lifestyle  . Physical activity:    Days per week: Not on file    Minutes per session: Not on file  . Stress: Not on file  Relationships  . Social connections:    Talks on phone: Not on file    Gets together: Not  on file    Attends religious service: Not on file    Active member of club or organization: Not on file    Attends meetings of clubs or organizations: Not on file    Relationship status: Not on file  Other Topics Concern  . Not on file  Social History Narrative   Homeless   History reviewed. No pertinent family history. Scheduled Meds: . buprenorphine-naloxone  1 tablet Sublingual Daily  . buPROPion  150 mg Oral Daily  . famotidine  20 mg Oral  BID  . feeding supplement (ENSURE ENLIVE)  237 mL Oral TID PC & HS  . senna-docusate  1 tablet Oral BID   Continuous Infusions: . pencillin G potassium IV 4 Million Units (01/26/18 1119)   PRN Meds:.acetaminophen, iohexol Medications Prior to Admission:  Prior to Admission medications   Medication Sig Start Date End Date Taking? Authorizing Provider  levofloxacin (LEVAQUIN) 750 MG tablet Take 750 mg by mouth daily.   Yes [provider]  MELATONIN PO Take 2 tablets by mouth daily as needed (Sleep).   Yes [provider]  warfarin (COUMADIN) 5 MG tablet Take 5 mg by mouth daily. 01/06/18  Yes [provider]   Allergies  Allergen Reactions  . Bee Venom Anaphylaxis  . Food Hives and Other (See Comments)    grapefruit  . Tylenol [Acetaminophen] Other (See Comments)    Pt states that he is not supposed to take this medication because of his Hep C.    Review of Systems  Constitutional: Positive for activity change and fatigue.  Psychiatric/Behavioral: Positive for dysphoric mood. Negative for hallucinations, sleep disturbance and suicidal ideas.    Physical Exam  Constitutional: He is oriented to person, place, and time.  Thin frail, disheveled  Cardiovascular: Normal rate and regular rhythm.  Murmur heard. Pulmonary/Chest: Effort normal and breath sounds normal.  Neurological: He is alert and oriented to person, place, and time.  Psychiatric:  Flat affect, cognitively intact  Nursing note and vitals  reviewed.   Vital Signs: BP 96/64 (BP Location: Left Arm)   Pulse 93   Temp 98.5 F (36.9 C) (Oral)   Resp 18   Ht 5' 11"  (1.803 m)   Wt 63.8 kg (140 lb 11.2 oz)   SpO2 100%   BMI 19.62 kg/m  Pain Scale: 0-10 POSS *See Group Information*: S-Acceptable,Sleep, easy to arouse Pain Score: 7    SpO2: SpO2: 100 % O2 Device:SpO2: 100 % O2 Flow Rate: .O2 Flow Rate (L/min): 2 L/min  IO: Intake/output summary:   Intake/Output Summary (Last 24 hours) at 01/26/2018 1424 Last data filed at 01/26/2018 1118 Gross per 24 hour  Intake 600 ml  Output 2650 ml  Net -2050 ml    LBM: Last BM Date: 01/25/18 Baseline Weight: Weight: 59.6 kg (131 lb 6.3 oz) Most recent weight: Weight: 63.8 kg (140 lb 11.2 oz)     Palliative Assessment/Data: PPS: 50%     Thank you for this consult. Palliative medicine will continue to follow and assist as needed.   Time In: 1300 Time Out: 1425 Time Total: 85 mins Greater than 50%  of this time was spent counseling and coordinating care related to the above assessment and plan.  Signed by: Mariana Kaufman, AGNP-C Palliative Medicine    Please contact Palliative Medicine Team phone at [phone] for questions and concerns.  For individual provider: See Shea Evans

## 2018-01-26 NOTE — Progress Notes (Signed)
PROGRESS NOTE    Marcus [name]  ZOX:[id number] DOB: 1975-12-10 DOA: 01/14/2018 PCP: [facility]   Brief Narrative: 41yo homeless man w/ a hx of polysubstance abuse, Hep B and Hep C, and depression/SI who was diagnosed with endocarditis in April 2019 and treated originally at [facility]. 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  He had been on anticoagualtion with coumadin. He presented to the [facility] ED w/ c/o back pain, and was found to be encephalopathic. He underwent TTE showing a 1.4 cm mobile vegetation on the posterior leaflet on the MV with severe regurgitation.     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  Blood cultures this admit negative so far.  Currently on penicillin IV as per ID this admission.  Not a surgical candidate for MVR as per cardiothoracic surgery.  Recommendations to continue with penicillin IV for a total of 6 weeks, following that pt to be on amoxicillin for life long.     CT angio of the abdomen with and without contrast showed  There is abrupt occlusion of the aorta below the level of the IMA as described on the prior report. Mixed density within the aorta suggest subacute age. Also, there is thickening and inflammatory change of the wall of the aorta suggesting vasculitis or mycotic aneurysm.There is also associated  occlusion of the bilateral common iliac arteries.Focal occlusion of the splenic artery. VASCULAR surgery suggested pt not a candidate for surgery.      MCA infarcts due to septic emboli:  No further work up as per neurology.  MRI Brain: Multifocal subacute bilateral MCA/watershed territory infarcts and right frontal high convexity small hemorrhage. No  Indication of anti coagulation as per neurology.  Recommend outpatient follow up with stroke clinic NP at [facility] in about 4 weeks.  If anti coagulation is needed , we need to obtain a repeat CT head without contrast to rule out worsening ICH. If the ICH is stable ,then he can be started on IV heparin as per Dr Roda Shutters with neurology.    Polysubstance abuse:  Pt reports he was on Suboxone previously and requested to be restarted on it.  On 8 mg SL suboxone.    Homeless ness: CSW consulted for possible SNF placement.    Anemia of  chronic disease Hemoglobin stable around 8.    Thrombocytosis suspect from the infection. Improving.     Leukocytosis:  Pt remains afebrile. Blood cultures negative so far.  Improving.    In view of his poor prognosis,  Will order palliative care for goals of care. PT evaluation ordered.   DVT prophylaxis: scd's Code Status: full code.  Family Communication: none at bedside.  Disposition Plan: SW to assist with disposition    Consultants:   ID Dr Drue Second  Cardiothoracic surgery   Cardiology Dr Duke Salvia  Vascular surgery  Dr Imogene Burn.    Procedures: TTE on 5/16.   Antimicrobials: PENICILLIN G   Subjective: No new complaints today. Awaiting PT evaluation and Palliative care consult for goals of care.   Objective: Vitals:   01/25/18 0617 01/25/18 1423 01/26/18 0629 01/26/18 1147  BP: (!) 97/54 (!)  Pulse: 98 98  93  Resp: Temp: 97.7 F (36.5 C) 98.9 F (37.2 C) 98.4 F (36.9 C) 98.5 F (36.9 C)  TempSrc: Oral Oral Oral Oral  SpO2: 98% 99% 100% 100%  Weight:  64.6 kg (142 lb 6.7 oz)  63.8 kg (140 lb 11.2 oz)   Height:        Intake/Output Summary (Last 24 hours) at 01/26/2018 1312 Last data filed at 01/26/2018 1118 Gross per 24 hour  Intake 600 ml  Output 2650 ml  Net -2050 ml   Filed Weights   01/24/18 0519 01/25/18 0617 01/26/18 0629  Weight: 62.1 kg (137 lb) 64.6 kg (142 lb 6.7 oz) 63.8 kg (140 lb 11.2 oz)    Examination:  General exam: Appears calm and comfortable.  Respiratory system: air entry fair , no wheezing or rhonchi.  Cardiovascular system: S1 & S2 heard, RRR. No JVD, 3/6 Systolic murmer present.  No pedal edema. Gastrointestinal system: Abdomen is soft non tender non distended bowel sounds good.  Central nervous system: Alert and oriented. Non focal. Extremities: no pedal edema, some redness in the right palmar aspect mild tenderness.. No cyanosis or clubbing.  Skin: No rashes, lesions or ulcers Psychiatry:  Affect is flat.     Data Reviewed: I have personally reviewed following labs and imaging studies  CBC: Recent Labs  Lab 01/21/18 1232 01/24/18 1632  WBC 12.6* 10.7*  HGB 8.9* 8.3*  HCT 30.2* 27.5*  MCV 90.4 89.3  PLT 500* 450*   Basic Metabolic Panel: Recent Labs  Lab 01/21/18 1232 01/24/18 1632  NA 139 136  K 4.0 4.1  CL 101 97*  CO2 28 31  GLUCOSE 136* 79  BUN 23* 21*  CREATININE 0.79 0.62  CALCIUM 8.6* 8.6*   GFR: Estimated Creatinine Clearance: 110.8 mL/min (by C-G formula based on SCr of 0.62 mg/dL). Liver Function Tests: No results for input(s): AST, ALT, ALKPHOS, BILITOT, PROT, ALBUMIN in the last 168 hours. No results for input(s): LIPASE, AMYLASE in the last 168 hours. No results for input(s): AMMONIA in the last 168 hours. Coagulation Profile: No results for input(s): INR, PROTIME in the last 168 hours. Cardiac Enzymes: No results for input(s): CKTOTAL, CKMB, CKMBINDEX, TROPONINI in the last 168 hours. BNP (last 3 results) No results for input(s): PROBNP in the last 8760  hours. HbA1C: No results for input(s): HGBA1C in the last 72 hours. CBG: No results for input(s): GLUCAP in the last 168 hours. Lipid Profile: No results for input(s): CHOL, HDL, LDLCALC, TRIG, CHOLHDL, LDLDIRECT in the last 72 hours. Thyroid Function Tests: No results for input(s): TSH, T4TOTAL, FREET4, T3FREE, THYROIDAB in the last 72 hours. Anemia Panel: No results for input(s): VITAMINB12, FOLATE, FERRITIN, TIBC, IRON, RETICCTPCT in the last 72 hours. Sepsis Labs: No results for input(s): PROCALCITON, LATICACIDVEN in the last 168 hours.  No results found for this or any previous visit (from the past 240 hour(s)).       Radiology Studies: Ct Head Wo Contrast  Result Date: 01/24/2018 CLINICAL DATA:  Continued surveillance infarcts and hemorrhage. EXAM: CT HEAD WITHOUT CONTRAST TECHNIQUE: Contiguous axial images were obtained from the base  of the skull through the vertex without intravenous contrast. COMPARISON:  CT head 01/14/2018. MR head 01/14/2018. CT angiogram 01/18/2018. FINDINGS: Brain: Previously identified subacute infarcts are involving into areas of chronic ischemia affecting the BILATERAL RIGHT greater than LEFT subcortical white matter. Tiny focus of hyperattenuation, RIGHT frontal cortex, image 23 appears improved. This measures no more than 2 x 3 mm on today's exam. Vascular: Improved appearance of the previously identified RIGHT M2 occlusion. Some hyperattenuation of vessel wall in the sylvian fissure, see series 3, image 12. Skull: Normal. Negative for fracture or focal lesion. Sinuses/Orbits: No acute finding. Other: None. IMPRESSION: Previously identified subacute white matter infarcts are have evolved into areas of chronic ischemia. Tiny focus of hyperattenuation RIGHT frontal cortex is improved. Improved appearance of the previously identified RIGHT M2 occlusion. Electronically Signed   By: Elsie Stain M.D.   On: 01/24/2018 17:38        Scheduled Meds: .  buprenorphine-naloxone  1 tablet Sublingual Daily  . famotidine  20 mg Oral BID  . feeding supplement (ENSURE ENLIVE)  237 mL Oral TID PC & HS  . senna-docusate  1 tablet Oral BID   Continuous Infusions: . pencillin G potassium IV 4 Million Units (01/26/18 1119)     LOS: 12 days    Time spent: 35 minutes    Kathlen Mody, MD [facility] Pager [phone]   If 7PM-7AM, please contact night-coverage [email or web address] Password [email or web address] 01/26/2018, 1:12 PM

## 2018-01-26 NOTE — Progress Notes (Signed)
Chart reviewed.  Not a surgical candidate fro valve repair from his endocarditis.  Continue antibx guided by ID.  No other recs at this time.  He is now DNR.  Plan to discharge to SNF for rehab.  Will sign off. Please call with any questions.

## 2018-01-26 NOTE — Evaluation (Signed)
Physical Therapy Evaluation Patient Details Name: Marcus [name] MRN: [id number] DOB: 1976/02/02 Today's Date: 01/26/2018   History of Present Illness   42 yo homeless man. He was diagnosed with endocarditis in mid April and treated originally at [facility] before leaving [facility]. 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 Patient came  with exacerbation of his back pain. 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 PMH - hep c, polysubstance abuse.   Clinical Impression  Pt presents to PT with decr in mobility after extended hospitalization. Currently recommend ST-SNF for further rehab. However if pt progresses quickly and remains in hospital for extended time pt may progress to independence and not require this.    Follow Up Recommendations SNF(currently but if progressess quickly may not need)    Equipment Recommendations  Other (comment)(To be assessed)    Recommendations for Other Services       Precautions / Restrictions Precautions Precautions: Fall Restrictions Weight Bearing Restrictions: No      Mobility  Bed Mobility Overal bed mobility: Needs Assistance Bed Mobility: Supine to Sit     Supine to sit: Supervision     General bed mobility comments: Assist for safety  Transfers Overall transfer level: Needs assistance Equipment used: 1 person hand held assist Transfers: Sit to/from Stand Sit to Stand: Min assist         General transfer comment: assist for balance  Ambulation/Gait Ambulation/Gait assistance: Min assist Ambulation Distance (Feet): 75 Feet Assistive device: Rolling walker (2 wheeled);1 person hand held assist Gait Pattern/deviations: Step-through pattern;Decreased stride length;Drifts right/left;Narrow base of support Gait velocity: decr Gait velocity  interpretation: 1.31 - 2.62 ft/sec, indicative of limited community ambulator General Gait Details: Assist for balance and support. Pt used rolling walker for part of ambulation and then used hand held Physiological scientist Rankin (Stroke Patients Only)       Balance Overall balance assessment: Needs assistance Sitting-balance support: No upper extremity supported;Feet supported Sitting balance-Leahy Scale: Good     Standing balance support: Single extremity supported Standing balance-Leahy Scale: Poor Standing balance comment: UE assist.                              Pertinent Vitals/Pain Pain Assessment: Faces Faces Pain Scale: Hurts a little bit Pain Location: back  Pain Descriptors / Indicators: Guarding Pain Intervention(s): Monitored during session;Limited activity within patient's tolerance    Home Living Family/patient expects to be discharged to:: Shelter/Homeless                      Prior Function Level of Independence: Independent         Comments: Pt reports short distance amb due to back pain     Hand Dominance        Extremity/Trunk Assessment   Upper Extremity Assessment Upper Extremity Assessment: Generalized weakness            Communication   Communication: No difficulties  Cognition Arousal/Alertness: Awake/alert Behavior During Therapy: Flat affect Overall Cognitive Status: Impaired/Different from baseline Area of Impairment: Problem solving;Safety/judgement  Safety/Judgement: Decreased awareness of safety;Decreased awareness of deficits   Problem Solving: Slow processing;Requires verbal cues;Requires tactile cues General Comments: Pt fixated on going outside and smoking.      General Comments      Exercises     Assessment/Plan    PT Assessment Patient needs continued PT services  PT Problem List Decreased strength;Decreased  activity tolerance;Decreased balance;Decreased mobility;Decreased knowledge of use of DME;Decreased cognition       PT Treatment Interventions DME instruction;Gait training;Functional mobility training;Therapeutic activities;Therapeutic exercise;Balance training;Stair training;Patient/family education    PT Goals (Current goals can be found in the Care Plan section)  Acute Rehab PT Goals Patient Stated Goal: to go outside and smoke PT Goal Formulation: With patient Time For Goal Achievement: 02/09/18 Potential to Achieve Goals: Good    Frequency Min 3X/week   Barriers to discharge Decreased caregiver support;Other (comment) Pt homeless with limited family support    Co-evaluation               AM-PAC PT "6 Clicks" Daily Activity  Outcome Measure Difficulty turning over in bed (including adjusting bedclothes, sheets and blankets)?: None Difficulty moving from lying on back to sitting on the side of the bed? : A Little Difficulty sitting down on and standing up from a chair with arms (e.g., wheelchair, bedside commode, etc,.)?: Unable Help needed moving to and from a bed to chair (including a wheelchair)?: A Little Help needed walking in hospital room?: A Little Help needed climbing 3-5 steps with a railing? : A Little 6 Click Score: 17    End of Session Equipment Utilized During Treatment: Gait belt Activity Tolerance: Patient tolerated treatment well Patient left: Other (comment)(in w/c to go outside with nurse tech) Nurse Communication: Mobility status PT Visit Diagnosis: Unsteadiness on feet (R26.81);Muscle weakness (generalized) (M62.81)    Time: 1610-9604 PT Time Calculation (min) (ACUTE ONLY): 12 min   Charges:     PT Treatments $Gait Training: 8-22 mins   PT G Codes:        [facility] PT [phone]   Angelina Ok [facility] 01/26/2018, 4:05 PM

## 2018-01-26 NOTE — ACP (Advance Care Planning) (Signed)
Advance Care Planning Note:   We discussed his goals of care and possible EOL wishes. He would not want his life prolonged by extraordinary measures including long term ventilator support or artificial feeding. We further discussed code status. 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 He says he would give it to anyone who needs it, but given all his health problems, he does not feel that it would be helpful to him if he were to experience cardiac arrest. He has been suicidal in the past, and does experience depression, but I do not think this affects his decision. He states that he is weak now, and he understands this is a result of his illness. He says he has struggled most of his life, and if he died, he would not want to be brought back only to have to struggle more. He is interested in continuing antibiotics and pursuing placement in a nursing facility for rehab and seeking counseling for drug rehab. We further discussed advanced healthcare planning pertaining to what his wishes would be if he experienced hemorrhage or stroke that left him disabled and unable to communicate. He said, "If I fall out, and I can't get up and go, just make me comfortable". He said that even his mother knows what kind of lifestyle he would want- he would not want to be permanantly disabled, bedbound. He would rather die comfortably. He tells me he fears dying in pain.     Marcus [name], AGNP-C Palliative Medicine  Please call Palliative Medicine team phone with any questions [phone]. For individual providers please see AMION.

## 2018-01-26 NOTE — Care Management Note (Signed)
Copied from: Case Management Note Donn Pierini RN, BSN Unit 4E-Case Manager [phone]  Patient Details  Name: Marcus [name] MRN: [id number] Date of Birth: [birth year]  Subjective/Objective:  Pt admitted with septic embolism, endocarditis, +vegetation. Hx IVDU                  Action/Plan: PTA Pt lived at home, will need CVTS consult for possible valve surgery- ID following for abx needs- CM and CSW to follow   Expected Discharge Date:                  Expected Discharge Plan:     In-House Referral:  Clinical Social Work  Discharge planning Services  CM Consult  Post Acute Care Choice:    Choice offered to:     DME Arranged:    DME Agency:     HH Arranged:    HH Agency:     Status of Service:  In process, will continue to follow  If discussed at Long Length of Stay Meetings, dates discussed:    Discharge Disposition:   Additional Comments:  01/26/18 Patient with poor insight into prognosis, non compliance, poor self care, very high risk for poor compliance to DC plan that may include coumadin, INR checks, oral abx.  Seen by palliative today. Discussed w Dr Blake Divine, will obtain psych consult to further evaluate capacity.  Per Pallaitive note NOK is mother.  Patient will require continued IV Abx. Unsure of exact end date.  Barriers to DC include IV Abx completion, housing at DC, medication assistance. CM and CSW will continue to follow.   [name], RN 01/26/2018, 4:01 PM

## 2018-01-27 DIAGNOSIS — F329 Major depressive disorder, single episode, unspecified: Secondary | ICD-10-CM

## 2018-01-27 MED ORDER — RESOURCE THICKENUP CLEAR PO POWD
ORAL | Status: DC | PRN
  Filled 2018-01-27 (×2): qty 125

## 2018-01-27 MED ORDER — NICOTINE POLACRILEX 2 MG MT GUM
2.0000 mg | CHEWING_GUM | OROMUCOSAL | Status: DC | PRN
  Administered 2018-01-28 – 2018-01-31 (×8): 2 mg via ORAL
  Filled 2018-01-27 (×2): qty 1

## 2018-01-27 NOTE — Progress Notes (Signed)
Daily Progress Note   Patient Name: Marcus [name]       Date: 01/27/2018 DOB: [birth year]  Age: 42 y.o. MRN#: [id number] Attending Physician: Tyrone Nine, MD Primary Care Physician: [facility] Admit Date: 01/14/2018  Reason for Consultation/Follow-up: Establishing goals of care  Subjective: Patient awake, alert. Oriented. Able to have meaningful conversation about GOC.  He discusses his past, his difficult relationship with his parents. He called his mother last night and was disappointed by her lack of support. I offered to call her and update her on his medical condition but he declined. Gave him emotional support as he discussed his interfamily conflict. We again discussed his overall medical condition. We discussed the need for long term antibiotics (IV and oral) and continued medical followup). He states he is committed to ongoing medical care and placement at SNF or "old folks home" for rehab. I reviewed the process for placement with him.   ROS  Length of Stay: 13  Current Medications: Scheduled Meds:  . buprenorphine-naloxone  1 tablet Sublingual Daily  . buPROPion  150 mg Oral Daily  . famotidine  20 mg Oral BID  . feeding supplement (ENSURE ENLIVE)  237 mL Oral TID PC & HS  . senna-docusate  1 tablet Oral BID    Continuous Infusions: . pencillin G potassium IV Stopped (01/27/18 1318)    PRN Meds: acetaminophen, iohexol, nicotine polacrilex, RESOURCE THICKENUP CLEAR  Physical Exam  Constitutional: He is oriented to person, place, and time.  Disheveled, thin, frail  Pulmonary/Chest: Effort normal.  Neurological: He is alert and oriented to person, place, and time.  Psychiatric:  Flat affect, distracted at times, easily redirected  Nursing  note and vitals reviewed.           Vital Signs: BP (!) 89/62 (BP Location: Left Arm)   Pulse 80   Temp 98.4 F (36.9 C) (Oral)   Resp 16   Ht  (1.803 m)   Wt 62.8 kg (138 lb 6.4 oz)   SpO2 100%   BMI 19.30 kg/m  SpO2: SpO2: 100 % O2 Device: O2 Device: Room Air O2 Flow Rate: O2 Flow Rate (L/min): 2 L/min  Intake/output summary:   Intake/Output Summary (Last 24 hours) at 01/27/2018 1623 Last data filed at 01/27/2018 1300 Gross per 24  hour  Intake 1560 ml  Output 2900 ml  Net -1340 ml   LBM: Last BM Date: 01/26/18 Baseline Weight: Weight: 59.6 kg (131 lb 6.3 oz) Most recent weight: Weight: 62.8 kg (138 lb 6.4 oz)       Palliative Assessment/Data: PPS: 50%   Flowsheet Rows     Most Recent Value  Intake Tab  Referral Department  Hospitalist  Unit at Time of Referral  Med/Surg Unit  Palliative Care Primary Diagnosis  Sepsis/Infectious Disease [Streptococcal endocarditis]  Date Notified  01/24/18  Palliative Care Type  New Palliative care  Reason for referral  Clarify Goals of Care  Date of Admission  01/14/18  Date first seen by Palliative Care  01/26/18  # of days Palliative referral response time  2 Day(s)  # of days IP prior to Palliative referral  10  Clinical Assessment  Psychosocial & Spiritual Assessment  Palliative Care Outcomes      Patient Active Problem List   Diagnosis Date Noted  . Depression   . Palliative care by specialist   . Advance care planning   . Goals of care, counseling/discussion   . Aneurysm (HCC)   . Severe mitral regurgitation   . Chronic viral hepatitis B without delta-agent (HCC) 01/20/2018  . Chronic viral hepatitis C (HCC) 01/20/2018  . Back pain 01/20/2018  . Endocarditis of mitral valve   . Aortic occlusion (HCC)   . 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 Septic embolism (HCC) 01/14/2018  . Cerebral embolism with cerebral infarction 01/14/2018  . Severe episode of recurrent major  depressive disorder, with psychotic features (HCC)   . Bipolar 1 disorder, mixed, moderate (HCC) 06/29/2017  . Polysubstance dependence including opioid type drug without complication, episodic abuse (HCC) 07/02/2016    Palliative Care Assessment & Plan   Patient Profile:41 y.o. male  with past medical history of endocarditis with septic embolisms (dx April 2-19)- abdominal aortic occlusion with splenic and renal infarcts, depression w/ SI, bipolar d/o, IV drug use, Hep B, Hep C admitted on 01/14/2018 with altered mental status, and SOB. 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 Not surgical candidate. Palliative medicine consulted for GOC.    Assessment/Recommendations/Plan - Continue full scope care -D/C to SNF  Goals of Care and Additional Recommendations:  Limitations on Scope of Treatment: Full Scope Treatment  Code Status:  DNR  Prognosis:   Unable to determine  Discharge Planning:  Skilled Nursing Facility for rehab with Palliative care service follow-up  Care plan was discussed with patient.   Thank you for allowing the Palliative Medicine Team to assist in the care of this patient.   Time In: 1300 Time Out: 1335 Total Time 35 mins Prolonged Time Billed No      Greater than 50%  of this time was spent counseling and coordinating care related to the above assessment and plan.  Ocie Bob, AGNP-C Palliative Medicine   Please contact Palliative Medicine Team phone at [phone] for questions and concerns.

## 2018-01-27 NOTE — Progress Notes (Signed)
PROGRESS NOTE  Marcus [name]  ZOX:[id number] DOB: 06-Jun-1976 DOA: 01/14/2018 PCP: Inc, [facility]   Brief Narrative: Marcus [name] is a 42 y.o. 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 The patient had a vegetation on the posterior leaflet of his mitral valve, and was found to have a subtotal occlusion of his aorta at the level of the IMA, as well as splenic and renal infarcts, treated with coumadin and antibiotics. He presented to the [facility] ED 5/15 with complaints of chest pain, back pain and found to be encephalopathic. 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 ID was consulted, patient placed on penicillin G. Blood cultures repeated at admission here remained negative. Coumadin was stopped due to ICH. TTE showed a 1.4 cm mobile vegetation on the posterior leaflet on the MV with severe regurgitation, though he's had no evidence of heart failure. Vascular srugery also consulted for distal aortoiliac occlusion, felt the patient could not undergo repair as there are no non-prosthetic options and this is contraindicated due to infection. Noted that distal pulses with monophasic with intact motor and sensation to both feet suggesting that these were chronic findings. 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 Palliative care was recommended and has followed with the patient, though he seems to have very few social supports.   Plan is to continue  IV antibiotics x6 weeks, then indefinite oral amoxicillin.    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  - ID consulted, continue medical management with IV PCN x6 weeks, then indefinite amoxicillin.  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  - Not a candidate for vascular surgery.  - Palliative care consulted for his multiple life-threatening findings. Pt is DNR, though desires continued full measures. Will have palliative care services following at SNF. - Needs anticoagulation, but this is contraindicated due to ICH. Could consider heparin gtt and transition to oral anticoagulant if no deficits develop.  Polysubstance abuse and opioid use disorder: - Dr. Oswaldo Done has seen the patient to inform decisions regarding suboxone use. On 1 tab SL daily.   Tobacco use: Pt reports patches make cravings worse.  - Nicotine gum ordered.  - Cessation counseling provided.  Depression:  - Restart wellbutrin   Chronic hepatitis B and C:  - Will need to follow up with ID as outpatient.  MCA CVA/ICH, M2 occlusion:  Cerebral angio showed no cerebral aneurysm. - No further work up per neurology. Follow up in 4 weeks in stroke clinic.  Thrombocytosis and leukocytosis: Reactive to infection.  - Monitor intermittently  Anemia of chronic disease:  - Monitor intermittently, transfuse if below 7g/dl.   DVT prophylaxis: SCDs Code Status: DNR Family Communication: None at bedside, declined offer to call Disposition Plan: SNF, CSW consulted  Consultants:   Cardiology  TCTS  Vascular surgery  ID  Palliative care  Procedures:   TTE on 5/16  Antimicrobials:  PCN G   Subjective: No chest pain or dyspnea, but feels weak generally. Having right hand pain on the palm. No rash.  Objective: Vitals:   01/26/18 1147 01/26/18 1944 01/27/18 0321 01/27/18 0800  BP: 96/64  (!) 89/54 (!) 89/62  Pulse: 93  96 80  Resp: Temp: 98.5 F (36.9 C) 99.2 F (37.3 C) 98.5 F (36.9 C) 98.4 F (36.9 C)  TempSrc: Oral Oral Oral Oral  SpO2: 100%  100% 100%  Weight:   62.8 kg (138 lb 6.4 oz)   Height:        Intake/Output Summary (Last 24 hours) at 01/27/2018 1729 Last data filed at 01/27/2018 1300 Gross per 24 hour  Intake 1560 ml  Output 2900 ml  Net -1340 ml   Filed Weights   01/25/18 0617 01/26/18 0629 01/27/18 0321  Weight: 64.6 kg (142 lb 6.7 oz) 63.8 kg (140 lb 11.2 oz) 62.8 kg (138 lb 6.4 oz)    Gen: Unkempt, tired-appearing male in no distress Pulm: Non-labored breathing room air. Clear to auscultation bilaterally.  CV: Regular rate and rhythm. III/VI holosystolic murmur greatest at apex, no rub, or gallop. No JVD, no pedal edema. Very diminished DP pulses.  GI: Abdomen soft, non-tender, non-distended, with normoactive bowel sounds. No organomegaly or masses felt. Ext: Warm, no deformities Skin: right palmar erythema without nodules, tender. No splinter hemorrhages.  Neuro: Alert and oriented. No focal neurological deficits. Psych: Judgement and insight appear fair. Mood  depressed & affect restricted.  Data Reviewed: I have personally reviewed following labs and imaging studies  CBC: Recent Labs  Lab 01/21/18 1232 01/24/18 1632  WBC 12.6* 10.7*  HGB 8.9* 8.3*  HCT 30.2* 27.5*  MCV 90.4 89.3  PLT 500* 450*   Basic Metabolic Panel: Recent Labs  Lab 01/21/18 1232 01/24/18 1632  NA 139 136  K 4.0 4.1  CL 101 97*  CO2 28 31  GLUCOSE 136* 79  BUN 23* 21*  CREATININE 0.79 0.62  CALCIUM 8.6* 8.6*   GFR: Estimated Creatinine Clearance: 109 mL/min (by C-G formula based on SCr of 0.62 mg/dL). Liver Function Tests: No results for input(s): AST, ALT, ALKPHOS, BILITOT, PROT, ALBUMIN in the last 168 hours. No results for input(s): LIPASE, AMYLASE in the last 168 hours. No results for input(s): AMMONIA in the last 168 hours. Coagulation Profile: No results for input(s): INR, PROTIME in the last 168 hours. Cardiac Enzymes: No results for input(s): CKTOTAL, CKMB, CKMBINDEX, TROPONINI in the last 168 hours. BNP (last 3 results) No results for input(s): PROBNP in the last 8760 hours. HbA1C: No results for input(s): HGBA1C in the last 72 hours. CBG: No results for input(s): GLUCAP in the last 168 hours. Lipid Profile: No results for input(s): CHOL, HDL, LDLCALC, TRIG,  CHOLHDL, LDLDIRECT in the last 72 hours. Thyroid Function Tests: No results for input(s): TSH, T4TOTAL, FREET4, T3FREE, THYROIDAB in the last 72 hours. Anemia Panel: No results for input(s): VITAMINB12, FOLATE, FERRITIN, TIBC, IRON, RETICCTPCT in the last 72 hours. Urine analysis:    Component Value Date/Time   COLORURINE YELLOW 01/12/2018 2050   APPEARANCEUR CLEAR 01/12/2018 2050   LABSPEC 1.014 01/12/2018 2050   PHURINE 5.0 01/12/2018 2050   GLUCOSEU NEGATIVE 01/12/2018 2050   HGBUR NEGATIVE 01/12/2018 2050   BILIRUBINUR NEGATIVE 01/12/2018 2050   KETONESUR NEGATIVE 01/12/2018 2050   PROTEINUR NEGATIVE 01/12/2018 2050   NITRITE NEGATIVE 01/12/2018 2050   LEUKOCYTESUR  NEGATIVE 01/12/2018 2050   No results found for this or any previous visit (from the past 240 hour(s)).    Radiology Studies: No results found.  Scheduled Meds: . buprenorphine-naloxone  1 tablet Sublingual Daily  . buPROPion  150 mg Oral Daily  . famotidine  20 mg Oral BID  . feeding supplement (ENSURE ENLIVE)  237 mL Oral TID PC & HS  . senna-docusate  1 tablet Oral BID   Continuous Infusions: . pencillin G potassium IV 4 Million Units (01/27/18 1642)     LOS: 13 days   Time spent: 25 minutes.  Tyrone Nine, MD [facility] [email or web address] Password [facility] 01/27/2018, 5:29 PM

## 2018-01-28 NOTE — Progress Notes (Signed)
Physical Therapy Treatment Patient Details Name: Marcus [name] MRN: [id number] DOB: 08-15-1976 Today's Date: 01/28/2018    History of Present Illness  42 yo homeless man. He was diagnosed with endocarditis in mid April and treated originally at [facility] before leaving [facility]. 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 Patient came  with exacerbation of his back pain. 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 PMH - hep c, polysubstance abuse.     PT Comments    Pt making steady progress with mobility.   Follow Up Recommendations  SNF(currently but if progressess quickly may not need)     Equipment Recommendations  Other (comment)(To be assessed)    Recommendations for Other Services       Precautions / Restrictions Precautions Precautions: Fall Restrictions Weight Bearing Restrictions: No    Mobility  Bed Mobility Overal bed mobility: Modified Independent                Transfers Overall transfer level: Needs assistance Equipment used: Rolling walker (2 wheeled) Transfers: Sit to/from Stand Sit to Stand: Min guard         General transfer comment: assist for balance  Ambulation/Gait Ambulation/Gait assistance: Min assist Ambulation Distance (Feet): 200 Feet Assistive device: Rolling walker (2 wheeled);1 person hand held assist Gait Pattern/deviations: Step-through pattern;Decreased stride length;Drifts right/left;Narrow base of support Gait velocity: decr Gait velocity interpretation: 1.31 - 2.62 ft/sec, indicative of limited community ambulator General Gait Details: Assist for balance   Stairs             Wheelchair Mobility    Modified Rankin (Stroke Patients Only)       Balance Overall balance assessment: Needs assistance Sitting-balance support: No upper  extremity supported;Feet supported Sitting balance-Leahy Scale: Good     Standing balance support: No upper extremity supported;During functional activity Standing balance-Leahy Scale: Fair                              Cognition Arousal/Alertness: Awake/alert Behavior During Therapy: Flat affect Overall Cognitive Status: No family/caregiver present to determine baseline cognitive functioning Area of Impairment: Attention                   Current Attention Level: Selective                  Exercises      General Comments        Pertinent Vitals/Pain Pain Assessment: Faces Faces Pain Scale: Hurts a little bit Pain Location: feet Pain Descriptors / Indicators: Tingling Pain Intervention(s): Monitored during session    Home Living                      Prior Function            PT Goals (current goals can now be found in the care plan section) Progress towards PT goals: Progressing toward goals    Frequency    Min 2X/week      PT Plan Current plan remains appropriate;Frequency needs to be updated    Co-evaluation              AM-PAC PT "6 Clicks" Daily Activity  Outcome Measure  Difficulty turning over in bed (  including adjusting bedclothes, sheets and blankets)?: None Difficulty moving from lying on back to sitting on the side of the bed? : A Little Difficulty sitting down on and standing up from a chair with arms (e.g., wheelchair, bedside commode, etc,.)?: Unable Help needed moving to and from a bed to chair (including a wheelchair)?: A Little Help needed walking in hospital room?: A Little Help needed climbing 3-5 steps with a railing? : A Little 6 Click Score: 17    End of Session Equipment Utilized During Treatment: Gait belt Activity Tolerance: Patient tolerated treatment well Patient left: in bed;with call bell/phone within reach;with nursing/sitter in room Nurse Communication: Mobility status PT Visit  Diagnosis: Unsteadiness on feet (R26.81);Muscle weakness (generalized) (M62.81)     Time: 1610-9604 PT Time Calculation (min) (ACUTE ONLY): 16 min  Charges:  $Gait Training: 8-22 mins                    G Codes:       [facility] PT [phone]    Angelina Ok [facility] 01/28/2018, 1:25 PM

## 2018-01-28 NOTE — Progress Notes (Signed)
PROGRESS NOTE  Marcus [name]  WUJ:[id number] DOB: May 18, 1976 DOA: 01/14/2018 PCP: [facility]   Brief Narrative: Marcus [name] is a 42 y.o. 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 The patient had a vegetation on the posterior leaflet of his mitral valve, and was found to have a subtotal occlusion of his aorta at the level of the IMA, as well as splenic and renal infarcts, treated with coumadin and antibiotics. He presented to the [facility] ED 5/15 with complaints of chest pain, back pain and found to be encephalopathic. 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 ID was consulted, patient placed on penicillin G. Blood cultures repeated at admission here remained negative. Coumadin was stopped due to ICH. TTE showed a 1.4 cm mobile vegetation on the posterior leaflet on the MV with severe regurgitation, though he's had no evidence of heart failure. Vascular srugery also consulted for distal aortoiliac occlusion, felt the patient could not undergo repair as there are no non-prosthetic options and this is contraindicated due to infection. Noted that distal pulses with monophasic with intact motor and sensation to both feet suggesting that these were chronic findings. 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 Palliative care was recommended and has followed with the patient, though he seems to have very few social supports.   Plan is to continue  IV antibiotics x6 weeks, then indefinite oral amoxicillin.    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  - ID consulted, continue medical management with IV PCN x6 weeks, then indefinite amoxicillin.  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  - Not a candidate for vascular surgery.  - Palliative care consulted for his multiple life-threatening findings. Pt is DNR, though desires continued full measures. Will have palliative care services following at SNF. - Needs anticoagulation, but this is contraindicated due to ICH. Could consider heparin gtt and transition to oral anticoagulant if no deficits develop.  Polysubstance abuse and opioid use disorder: - Dr. Oswaldo Done has seen the patient to inform decisions regarding suboxone use. On 1 tab SL daily.   Tobacco use: Pt reports patches make cravings worse.  - Nicotine gum ordered prn  - Cessation counseling provided.  Depression:  - Restart wellbutrin   Chronic hepatitis B and C:  - Will need to follow up with ID as outpatient.  MCA CVA/ICH, M2  occlusion: Cerebral angio showed no cerebral aneurysm. - No further work up per neurology. Follow up in 4 weeks in stroke clinic.  Thrombocytosis and leukocytosis: Reactive to infection.  - Monitor intermittently  Anemia of chronic disease:  - Monitor intermittently, transfuse if below 7g/dl.   DVT prophylaxis: SCDs Code Status: DNR Family Communication: None at bedside, declined offer to call again today Disposition Plan: SNF, CSW consulted  Consultants:   Cardiology  TCTS  Vascular surgery  ID  Palliative care  Procedures:   TTE on 5/16  Antimicrobials:  PCN G   Subjective: Has some bilateral leg/feet soreness worse in some positions and better when he stretches them. Hasn't been walking, so unable to comment on exertional component.   Objective: Vitals:   01/27/18 0321 01/27/18 0800 01/27/18 1943 01/28/18 0400  BP: (!) 89/54 (!) 89/62 (!) 100/58   Pulse: 96 80 84   Resp: Temp: 98.5 F (36.9 C) 98.4 F (36.9 C) 99.2 F (37.3 C)   TempSrc: Oral Oral Oral   SpO2: 100% 100% 100%   Weight: 62.8 kg (138 lb 6.4 oz)   63.2 kg (139 lb 6.4 oz)  Height:        Intake/Output Summary (Last 24 hours) at 01/28/2018 1315 Last data filed at 01/28/2018 1106 Gross per 24 hour  Intake 1180 ml  Output 2335 ml  Net -1155 ml   Filed Weights   01/26/18 0629 01/27/18 0321 01/28/18 0400  Weight: 63.8 kg (140 lb 11.2 oz) 62.8 kg (138 lb 6.4 oz) 63.2 kg (139 lb 6.4 oz)    Gen: Unkempt, tired-appearing male in no distress Pulm: Non-labored breathing room air. Clear to auscultation bilaterally.  CV: Regular rate and rhythm. III/VI holosystolic murmur greatest at apex, no rub, or gallop. No JVD, no pedal edema. Very diminished DP pulses, cap refill fair.  GI: Abdomen soft, non-tender, non-distended, with normoactive bowel sounds. No organomegaly or masses felt. Ext: Warm, no deformities Skin: Very mild right palmar erythema without nodules, tender. No splinter  hemorrhages in 4 extremities. Neuro: Alert and oriented. No focal neurological deficits. Psych: Judgement and insight appear fair. Mood depressed & affect restricted.   Time spent: 15 minutes.  Tyrone Nine, MD [facility] [email or web address] Password [email or web address] 01/28/2018, 1:15 PM

## 2018-01-29 MED ORDER — BISACODYL 5 MG PO TBEC
5.0000 mg | DELAYED_RELEASE_TABLET | Freq: Every day | ORAL | Status: DC | PRN
  Administered 2018-02-01: 5 mg via ORAL
  Filled 2018-01-29: qty 1

## 2018-01-29 NOTE — Progress Notes (Signed)
PROGRESS NOTE    Marcus [name]  ZHY:[id number] DOB: 1976-04-02 DOA: 01/14/2018 PCP: Inc, [facility]     Brief Narrative:  Marcus [name] is a 42 y.o. 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 The patient had a vegetation on the posterior leaflet of his mitral valve, and was found to have a subtotal occlusion of his aorta at the level of the IMA, as well as splenic and renal infarcts, treated with coumadin and antibiotics. He presented to the [facility] ED 5/15 with complaints of chest pain, back pain and found to be encephalopathic. 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 ID was consulted, patient placed on penicillin G. Blood cultures repeated at admission here remained negative. Coumadin was stopped due to ICH. TTE showed a 1.4 cm mobile vegetation on the posterior leaflet on the MV with severe regurgitation, though he's had no evidence of heart failure. Vascular srugery also consulted for distal aortoiliac occlusion, felt the patient could not undergo repair as there are no non-prosthetic options and this is contraindicated due to infection. Noted that distal pulses with monophasic with intact motor and sensation to both feet suggesting that these were chronic findings. 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 Palliative care was recommended and has followed with the patient, though he seems to have very few social supports.   New events  last 24 hours / Subjective: No acute events. No new complaints on exam today. States his right hand feels better. Denies chest pain, shortness of breath, nausea, vomiting.   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  - ID consulted, continue medical management with IV PCN x6 weeks, then indefinite amoxicillin. Last day IV PCN 6/11. Will need to remain in patient until completion of IV antibiotics as he cannot be placed in SNF while on suboxone.  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  - Not a candidate for vascular surgery.  - Palliative care consulted for his multiple life-threatening findings. Pt is DNR, though desires continued full measures. Will have palliative care services following at SNF. - Needs anticoagulation, but this is contraindicated due to ICH. Could consider heparin gtt and transition to oral anticoagulant  if no deficits develop.  Polysubstance abuse and opioid use disorder: - Dr. Oswaldo [name] has seen the patient to inform decisions regarding suboxone use. On 1 tab SL daily.   Tobacco  use: Pt reports patches make cravings worse.  - Nicotine gum ordered prn  - Cessation counseling provided.   Depression:  - Restart wellbutrin   Chronic hepatitis B and C:  - Will need to follow up with ID as outpatient.  MCA CVA/ICH, M2 occlusion: Cerebral angio showed no cerebral aneurysm. - No further work up per neurology. Follow up in 4 weeks in stroke clinic.  Thrombocytosis and leukocytosis: Reactive to infection.  - Monitor intermittently  Anemia of chronic disease:  - Monitor intermittently, transfuse if below 7g/dl.    DVT prophylaxis: SCD Code Status: DNR Family Communication: No family at bedside Disposition Plan: Finish IV antibiotics through 6/11    Consultants:   Cardiology  TCTS  Vascular surgery  ID  Palliative care  Procedures:   Echo 5/16   Antimicrobials:  Anti-infectives (From admission, onward)   Start     Dose/Rate Route Frequency Ordered Stop   01/19/18 1600  penicillin G potassium 4 Million Units in dextrose 5 % 250 mL IVPB     4 Million Units 250 mL/hr over 60 Minutes Intravenous Every 4 hours 01/19/18 1424 02/09/18 2359   01/18/18 2200  vancomycin (VANCOCIN) IVPB 750 mg/150 ml premix  Status:  Discontinued     750 mg 150 mL/hr over 60 Minutes Intravenous Every 8 hours 01/18/18 1431 01/19/18 1424   01/14/18 2000  vancomycin (VANCOCIN) IVPB 1000 mg/200 mL premix  Status:  Discontinued     1,000 mg 200 mL/hr over 60 Minutes Intravenous Every 8 hours 01/14/18 1103 01/18/18 1431   01/14/18 2000  piperacillin-tazobactam (ZOSYN) IVPB 3.375 g  Status:  Discontinued     3.375 g 12.5 mL/hr over 240 Minutes Intravenous Every 8 hours 01/14/18 1103 01/16/18 1211   01/14/18 1100  vancomycin (VANCOCIN) 1,250 mg in sodium chloride 0.9 % 250 mL IVPB     1,250 mg 166.7 mL/hr over 90 Minutes Intravenous  Once 01/14/18 1036 01/14/18 1631   01/14/18 1045  piperacillin-tazobactam (ZOSYN) IVPB 3.375 g     3.375 g 100 mL/hr over 30 Minutes  Intravenous  Once 01/14/18 1036 01/14/18 1228       Objective: Vitals:   01/28/18 0400 01/28/18 1440 01/28/18 1826 01/29/18 0442  BP:   (!) 97/52   Pulse:  84    Resp:  18    Temp:   98 F (36.7 C)   TempSrc:  Oral    SpO2:  98%    Weight: 63.2 kg (139 lb 6.4 oz)   63.2 kg (139 lb 5.3 oz)  Height:        Intake/Output Summary (Last 24 hours) at 01/29/2018 1347 Last data filed at 01/29/2018 1047 Gross per 24 hour  Intake 990 ml  Output 1300 ml  Net -310 ml   Filed Weights   01/27/18 0321 01/28/18 0400 01/29/18 0442  Weight: 62.8 kg (138 lb 6.4 oz) 63.2 kg (139 lb 6.4 oz) 63.2 kg (139 lb 5.3 oz)    Examination:  General exam: Appears calm and comfortable  Respiratory system: Clear to auscultation. Respiratory effort normal. Cardiovascular system: S1 & S2 heard, RRR. No JVD, murmurs, rubs, gallops or clicks. No pedal edema. Gastrointestinal system: Abdomen is nondistended, soft and nontender. No organomegaly or masses felt. Normal bowel sounds heard. Central nervous system: Alert and  oriented. No focal neurological deficits. Extremities: Symmetric 5 x 5 power. Skin: No rashes, lesions or ulcers Psychiatry: Judgement and insight appear stable   Data Reviewed: I have personally reviewed following labs and imaging studies  CBC: Recent Labs  Lab 01/24/18 1632  WBC 10.7*  HGB 8.3*  HCT 27.5*  MCV 89.3  PLT 450*   Basic Metabolic Panel: Recent Labs  Lab 01/24/18 1632  NA 136  K 4.1  CL 97*  CO2 31  GLUCOSE 79  BUN 21*  CREATININE 0.62  CALCIUM 8.6*   GFR: Estimated Creatinine Clearance: 109.7 mL/min (by C-G formula based on SCr of 0.62 mg/dL). Liver Function Tests: No results for input(s): AST, ALT, ALKPHOS, BILITOT, PROT, ALBUMIN in the last 168 hours. No results for input(s): LIPASE, AMYLASE in the last 168 hours. No results for input(s): AMMONIA in the last 168 hours. Coagulation Profile: No results for input(s): INR, PROTIME in the last 168  hours. Cardiac Enzymes: No results for input(s): CKTOTAL, CKMB, CKMBINDEX, TROPONINI in the last 168 hours. BNP (last 3 results) No results for input(s): PROBNP in the last 8760 hours. HbA1C: No results for input(s): HGBA1C in the last 72 hours. CBG: No results for input(s): GLUCAP in the last 168 hours. Lipid Profile: No results for input(s): CHOL, HDL, LDLCALC, TRIG, CHOLHDL, LDLDIRECT in the last 72 hours. Thyroid Function Tests: No results for input(s): TSH, T4TOTAL, FREET4, T3FREE, THYROIDAB in the last 72 hours. Anemia Panel: No results for input(s): VITAMINB12, FOLATE, FERRITIN, TIBC, IRON, RETICCTPCT in the last 72 hours. Sepsis Labs: No results for input(s): PROCALCITON, LATICACIDVEN in the last 168 hours.  No results found for this or any previous visit (from the past 240 hour(s)).     Radiology Studies: No results found.    Scheduled Meds: . buprenorphine-naloxone  1 tablet Sublingual Daily  . buPROPion  150 mg Oral Daily  . famotidine  20 mg Oral BID  . feeding supplement (ENSURE ENLIVE)  237 mL Oral TID PC & HS  . senna-docusate  1 tablet Oral BID   Continuous Infusions: . pencillin G potassium IV 4 Million Units (01/29/18 1250)     LOS: 15 days    Time spent: 40 minutes   Noralee Stain, DO [facility] [email or web address] Password [email or web address] 01/29/2018, 1:47 PM

## 2018-01-29 NOTE — Clinical Social Work Note (Addendum)
Discussed case with MD. Patient will need IV Penicillin for another two weeks. Patient is also on Suboxone. Patient is currently on "difficult to place" list for SNF but patient cannot go to SNF on Suboxone. Left voicemail for Chiropodistassistant director of social work. Will notify when he calls back. Patient will more than likely have to stay in house until he finishes his antibiotic course.  Marcus CourtSarah Courtne [name], CSW [phone]  10:34 am ChiropodistAssistant director of social work confirmed patient cannot be placed on Suboxone.  Marcus CourtSarah Ernisha [name], CSW [phone]

## 2018-01-29 NOTE — Progress Notes (Signed)
Nurse recommended visit for this patient.  He was in deep sleep despite knocking and calling his name.  If later he awakes and desires chaplain visit, simply page chaplain on call and someone will come and visit. Phebe CollaDonna S Adaisha Campise, Chaplain   01/29/18 1500  Clinical Encounter Type  Visited With Patient  Visit Type Initial  Referral From Nurse  Consult/Referral To Chaplain

## 2018-01-30 MED ORDER — DOCUSATE SODIUM 100 MG PO CAPS: 100.0000 mg | ORAL_CAPSULE | Freq: Every day | ORAL | Status: DC

## 2018-01-30 NOTE — Plan of Care (Signed)
Care plans reviewed and patient is progressing.  

## 2018-01-30 NOTE — Progress Notes (Signed)
PROGRESS NOTE    Marcus [name]  JXB:[id number] DOB: [birth year] DOA: 01/14/2018 PCP: [facility]     Brief Narrative:  Marcus [name] is a 42 y.o. 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 The patient had a vegetation on the posterior leaflet of his mitral valve, and was found to have a subtotal occlusion of his aorta at the level of the IMA, as well as splenic and renal infarcts, treated with coumadin and antibiotics. He presented to the [facility] ED 5/15 with complaints of chest pain, back pain and found to be encephalopathic. 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 ID was consulted, patient placed on penicillin G. Blood cultures repeated at admission here remained negative. Coumadin was stopped due to ICH. TTE showed a 1.4 cm mobile vegetation on the posterior leaflet on the MV with severe regurgitation, though he's had no evidence of heart failure. Vascular srugery also consulted for distal aortoiliac occlusion, felt the patient could not undergo repair as there are no non-prosthetic options and this is contraindicated due to infection. Noted that distal pulses with monophasic with intact motor and sensation to both feet suggesting that these were chronic findings. 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 Palliative care was recommended and has followed with the patient, though he seems to have very few social supports.   New events  last 24 hours / Subjective: No acute issues. States he has had some back pain. Eating breakfast this morning in bed.   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  - Not a surgical candidate for MV repair - ID consulted, continue medical management with IV PCN x6 weeks, then indefinite amoxicillin. Last day IV PCN 6/11. Will need to remain in patient until completion of IV antibiotics as he cannot be placed in SNF while on suboxone.   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  - Not a candidate for vascular surgery - Palliative care consulted for his multiple life-threatening findings. Pt is DNR, though desires continued full medical treatment measures. Will have palliative care services following at SNF. - Needs anticoagulation, but this is contraindicated due to ICH. Could consider heparin gtt and transition to oral anticoagulant if no deficits develop.  Polysubstance  abuse and opioid use disorder: - Dr. Oswaldo Done has seen the patient to inform decisions regarding suboxone use. On 1 tab SL daily.   Tobacco use: Pt reports patches make cravings  worse.  - Nicotine gum ordered prn  - Cessation counseling provided.   Depression:  - Restart wellbutrin   Chronic hepatitis B and C:  - Will need to follow up with ID as outpatient.  MCA CVA/ICH, M2 occlusion: Cerebral angio showed no cerebral aneurysm. - No further work up per neurology. Follow up in 4 weeks in stroke clinic.  Thrombocytosis and leukocytosis: Reactive to infection.  - Monitor intermittently  Anemia of chronic disease:  - Monitor intermittently, transfuse if below 7g/dl.    DVT prophylaxis: SCD Code Status: DNR Family Communication: No family at bedside Disposition Plan: Finish IV antibiotics through 6/11    Consultants:   Cardiology  TCTS  Vascular surgery  ID  Palliative care  Procedures:   Echo 5/16   Antimicrobials:  Anti-infectives (From admission, onward)   Start     Dose/Rate Route Frequency Ordered Stop   01/19/18 1600  penicillin G potassium 4 Million Units in dextrose 5 % 250 mL IVPB     4 Million Units 250 mL/hr over 60 Minutes Intravenous Every 4 hours 01/19/18 1424 02/09/18 2359   01/18/18 2200  vancomycin (VANCOCIN) IVPB 750 mg/150 ml premix  Status:  Discontinued     750 mg 150 mL/hr over 60 Minutes Intravenous Every 8 hours 01/18/18 1431 01/19/18 1424   01/14/18 2000  vancomycin (VANCOCIN) IVPB 1000 mg/200 mL premix  Status:  Discontinued     1,000 mg 200 mL/hr over 60 Minutes Intravenous Every 8 hours 01/14/18 1103 01/18/18 1431   01/14/18 2000  piperacillin-tazobactam (ZOSYN) IVPB 3.375 g  Status:  Discontinued     3.375 g 12.5 mL/hr over 240 Minutes Intravenous Every 8 hours 01/14/18 1103 01/16/18 1211   01/14/18 1100  vancomycin (VANCOCIN) 1,250 mg in sodium chloride 0.9 % 250 mL IVPB     1,250 mg 166.7 mL/hr over 90 Minutes Intravenous  Once 01/14/18 1036 01/14/18 1631   01/14/18 1045  piperacillin-tazobactam (ZOSYN) IVPB 3.375 g     3.375 g 100 mL/hr over 30 Minutes Intravenous  Once 01/14/18 1036 01/14/18 1228        Objective: Vitals:   01/29/18 1240 01/29/18 2030 01/30/18 0515 01/30/18 0547  BP: (!) 95/58 (!) 102/56 93/60   Pulse: 74 60 89   Resp: 16 16 18    Temp:  98.6 F (37 C) 98.6 F (37 C)   TempSrc:  Oral Oral   SpO2: 98% 95% 96%   Weight:    61.6 kg (135 lb 14.4 oz)  Height:        Intake/Output Summary (Last 24 hours) at 01/30/2018 0945 Last data filed at 01/30/2018 0651 Gross per 24 hour  Intake 1974 ml  Output 710 ml  Net 1264 ml   Filed Weights   01/28/18 0400 01/29/18 0442 01/30/18 0547  Weight: 63.2 kg (139 lb 6.4 oz) 63.2 kg (139 lb 5.3 oz) 61.6 kg (135 lb 14.4 oz)    Examination: General exam: Appears calm and comfortable  Respiratory system: Clear to auscultation. Respiratory effort normal. Cardiovascular system: S1 & S2 heard, RRR. No JVD, murmurs, rubs, gallops or clicks. No pedal edema. Gastrointestinal system: Abdomen is nondistended, soft and nontender. No organomegaly or masses felt. Normal bowel sounds heard. Central nervous system: Alert and oriented. No focal neurological deficits. Extremities: Symmetric 5  x 5 power. Skin: No rashes, lesions or ulcers on exposed skin  Psychiatry: Judgement and insight appear stable    Data Reviewed: I have personally reviewed following labs and imaging studies  CBC: Recent Labs  Lab 01/24/18 1632  WBC 10.7*  HGB 8.3*  HCT 27.5*  MCV 89.3  PLT 450*   Basic Metabolic Panel: Recent Labs  Lab 01/24/18 1632  NA 136  K 4.1  CL 97*  CO2 31  GLUCOSE 79  BUN 21*  CREATININE 0.62  CALCIUM 8.6*   GFR: Estimated Creatinine Clearance: 106.9 mL/min (by C-G formula based on SCr of 0.62 mg/dL). Liver Function Tests: No results for input(s): AST, ALT, ALKPHOS, BILITOT, PROT, ALBUMIN in the last 168 hours. No results for input(s): LIPASE, AMYLASE in the last 168 hours. No results for input(s): AMMONIA in the last 168 hours. Coagulation Profile: No results for input(s): INR, PROTIME in the last 168  hours. Cardiac Enzymes: No results for input(s): CKTOTAL, CKMB, CKMBINDEX, TROPONINI in the last 168 hours. BNP (last 3 results) No results for input(s): PROBNP in the last 8760 hours. HbA1C: No results for input(s): HGBA1C in the last 72 hours. CBG: No results for input(s): GLUCAP in the last 168 hours. Lipid Profile: No results for input(s): CHOL, HDL, LDLCALC, TRIG, CHOLHDL, LDLDIRECT in the last 72 hours. Thyroid Function Tests: No results for input(s): TSH, T4TOTAL, FREET4, T3FREE, THYROIDAB in the last 72 hours. Anemia Panel: No results for input(s): VITAMINB12, FOLATE, FERRITIN, TIBC, IRON, RETICCTPCT in the last 72 hours. Sepsis Labs: No results for input(s): PROCALCITON, LATICACIDVEN in the last 168 hours.  No results found for this or any previous visit (from the past 240 hour(s)).     Radiology Studies: No results found.    Scheduled Meds: . buprenorphine-naloxone  1 tablet Sublingual Daily  . buPROPion  150 mg Oral Daily  . famotidine  20 mg Oral BID  . feeding supplement (ENSURE ENLIVE)  237 mL Oral TID PC & HS  . senna-docusate  1 tablet Oral BID   Continuous Infusions: . pencillin G potassium IV 4 Million Units (01/30/18 [id number])     LOS: 16 days    Time spent: 25 minutes   Noralee StainJennifer George Alcantar, DO [facility] [email or web address] Password [email or web address] 01/30/2018, 9:45 AM

## 2018-01-31 LAB — CBC
HCT: 31 % — ABNORMAL LOW (ref 39.0–52.0)
Hemoglobin: 9.1 g/dL — ABNORMAL LOW (ref 13.0–17.0)
MCH: 26.3 pg (ref 26.0–34.0)
MCHC: 29.4 g/dL — ABNORMAL LOW (ref 30.0–36.0)
MCV: 89.6 fL (ref 78.0–100.0)
PLATELETS: 422 10*3/uL — AB (ref 150–400)
RBC: 3.46 MIL/uL — AB (ref 4.22–5.81)
RDW: 17.3 % — AB (ref 11.5–15.5)
WBC: 6.5 10*3/uL (ref 4.0–10.5)

## 2018-01-31 LAB — BASIC METABOLIC PANEL
ANION GAP: 9 (ref 5–15)
BUN: 16 mg/dL (ref 6–20)
CALCIUM: 9 mg/dL (ref 8.9–10.3)
CO2: 27 mmol/L (ref 22–32)
Chloride: 101 mmol/L (ref 101–111)
Creatinine, Ser: 0.64 mg/dL (ref 0.61–1.24)
GLUCOSE: 89 mg/dL (ref 65–99)
POTASSIUM: 4.3 mmol/L (ref 3.5–5.1)
SODIUM: 137 mmol/L (ref 135–145)

## 2018-01-31 NOTE — Plan of Care (Signed)
Care plans reviewed and patient is progressing.  

## 2018-01-31 NOTE — Progress Notes (Signed)
PROGRESS NOTE    Marcus [name]  ZOX:[id number] DOB: [birth year] DOA: 01/14/2018 PCP: [facility], [facility]     Brief Narrative:  Marcus [name] is a 42 y.o. 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 The patient had a vegetation on the posterior leaflet of his mitral valve, and was found to have a subtotal occlusion of his aorta at the level of the IMA, as well as splenic and renal infarcts, treated with coumadin and antibiotics. He presented to the [facility] ED 5/15 with complaints of chest pain, back pain and found to be encephalopathic. 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 ID was consulted, patient placed on penicillin G. Blood cultures repeated at admission here remained negative. Coumadin was stopped due to ICH. TTE showed a 1.4 cm mobile vegetation on the posterior leaflet on the MV with severe regurgitation, though he's had no evidence of heart failure. Vascular srugery also consulted for distal aortoiliac occlusion, felt the patient could not undergo repair as there are no non-prosthetic options and this is contraindicated due to infection. Noted that distal pulses with monophasic with intact motor and sensation to both feet suggesting that these were chronic findings. 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 Palliative care was recommended and has followed with the patient, though he seems to have very few social supports.   New events  last 24 hours / Subjective: No issues overnight. No complaints this morning. Sitting in bed, eating breakfast.   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  - Not a surgical candidate for MV repair - ID consulted, continue medical management with IV PCN x6 weeks, then indefinite amoxicillin. Last day IV PCN 6/11. Will need to remain inpatient until completion of IV antibiotics as he cannot be placed in SNF while on suboxone.   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  - Not a candidate for vascular surgery - Palliative care consulted for his multiple life-threatening findings. Pt is DNR, though desires continued full medical treatment measures. Will have palliative care services following at SNF. - Needs anticoagulation, but this is contraindicated due to ICH. Could consider heparin gtt and transition to oral anticoagulant if no deficits develop.  Polysubstance abuse and opioid use disorder: -  Dr. Oswaldo DoneVincent has seen the patient to inform decisions regarding suboxone use. On 1 tab SL daily.   Tobacco use: Pt reports patches make cravings worse.    - Nicotine gum ordered prn  - Cessation counseling provided.   Depression:  - Restart wellbutrin   Chronic hepatitis B and C:  - Will need to follow up with ID as outpatient.  MCA CVA/ICH, M2 occlusion: Cerebral angio showed no cerebral aneurysm. - No further work up per neurology. Follow up in 4 weeks in stroke clinic.  Thrombocytosis and leukocytosis: Reactive to infection.  - Stable  Anemia of chronic disease:  - Stable    DVT prophylaxis: SCD Code Status: DNR Family Communication: No family at bedside Disposition Plan: Finish IV antibiotics through 6/11 inpatient   Consultants:   Cardiology  TCTS  Vascular surgery  ID  Palliative care  Procedures:   Echo 5/16   Antimicrobials:  Anti-infectives (From admission, onward)   Start     Dose/Rate Route Frequency Ordered Stop   01/19/18 1600  penicillin G potassium 4 Million Units in dextrose 5 % 250 mL IVPB     4 Million Units 250 mL/hr over 60 Minutes Intravenous Every 4 hours 01/19/18 1424 02/09/18 2359   01/18/18 2200  vancomycin (VANCOCIN) IVPB 750 mg/150 ml premix  Status:  Discontinued     750 mg 150 mL/hr over 60 Minutes Intravenous Every 8 hours 01/18/18 1431 01/19/18 1424   01/14/18 2000  vancomycin (VANCOCIN) IVPB 1000 mg/200 mL premix  Status:  Discontinued     1,000 mg 200 mL/hr over 60 Minutes Intravenous Every 8 hours 01/14/18 1103 01/18/18 1431   01/14/18 2000  piperacillin-tazobactam (ZOSYN) IVPB 3.375 g  Status:  Discontinued     3.375 g 12.5 mL/hr over 240 Minutes Intravenous Every 8 hours 01/14/18 1103 01/16/18 1211   01/14/18 1100  vancomycin (VANCOCIN) 1,250 mg in sodium chloride 0.9 % 250 mL IVPB     1,250 mg 166.7 mL/hr over 90 Minutes Intravenous  Once 01/14/18 1036 01/14/18 1631   01/14/18 1045  piperacillin-tazobactam (ZOSYN) IVPB 3.375 g     3.375 g 100 mL/hr over 30 Minutes Intravenous  Once 01/14/18 1036 01/14/18 1228       Objective: Vitals:   01/30/18 0547 01/30/18  1205 01/30/18 2110 01/31/18 0349  BP:  (!) 91/55  92/61  Pulse:   69 78  Resp:   20 18  Temp:  98.5 F (36.9 C) 98.8 F (37.1 C) 98.9 F (37.2 C)  TempSrc:  Oral Oral Oral  SpO2:   98% 100%  Weight: 61.6 kg (135 lb 14.4 oz)   62.6 kg (137 lb 14.4 oz)  Height:        Intake/Output Summary (Last 24 hours) at 01/31/2018 0827 Last data filed at 01/31/2018 0404 Gross per 24 hour  Intake 2438 ml  Output 1580 ml  Net 858 ml   Filed Weights   01/29/18 0442 01/30/18 0547 01/31/18 0349  Weight: 63.2 kg (139 lb 5.3 oz) 61.6 kg (135 lb 14.4 oz) 62.6 kg (137 lb 14.4 oz)    Examination: General exam: Appears calm and comfortable  Respiratory system: Clear to auscultation. Respiratory effort normal. Cardiovascular system: S1 & S2 heard, RRR. No JVD, murmurs, rubs, gallops or clicks. No pedal edema. Gastrointestinal system: Abdomen is nondistended, soft and nontender. No organomegaly or masses felt. Normal bowel sounds heard. Central nervous system: Alert and oriented. No focal neurological deficits. Extremities: Symmetric 5 x 5 power. Skin:  No rashes, lesions or ulcers on exposed skin  Psychiatry: Judgement and insight appear stable. Mood & affect appropriate.    Data Reviewed: I have personally reviewed following labs and imaging studies  CBC: Recent Labs  Lab 01/24/18 1632 01/31/18 0334  WBC 10.7* 6.5  HGB 8.3* 9.1*  HCT 27.5* 31.0*  MCV 89.3 89.6  PLT 450* 422*   Basic Metabolic Panel: Recent Labs  Lab 01/24/18 1632 01/31/18 0334  NA 136 137  K 4.1 4.3  CL 97* 101  CO2 31 27  GLUCOSE 79 89  BUN 21* 16  CREATININE 0.62 0.64  CALCIUM 8.6* 9.0   GFR: Estimated Creatinine Clearance: 108.7 mL/min (by C-G formula based on SCr of 0.64 mg/dL). Liver Function Tests: No results for input(s): AST, ALT, ALKPHOS, BILITOT, PROT, ALBUMIN in the last 168 hours. No results for input(s): LIPASE, AMYLASE in the last 168 hours. No results for input(s): AMMONIA in the last 168  hours. Coagulation Profile: No results for input(s): INR, PROTIME in the last 168 hours. Cardiac Enzymes: No results for input(s): CKTOTAL, CKMB, CKMBINDEX, TROPONINI in the last 168 hours. BNP (last 3 results) No results for input(s): PROBNP in the last 8760 hours. HbA1C: No results for input(s): HGBA1C in the last 72 hours. CBG: No results for input(s): GLUCAP in the last 168 hours. Lipid Profile: No results for input(s): CHOL, HDL, LDLCALC, TRIG, CHOLHDL, LDLDIRECT in the last 72 hours. Thyroid Function Tests: No results for input(s): TSH, T4TOTAL, FREET4, T3FREE, THYROIDAB in the last 72 hours. Anemia Panel: No results for input(s): VITAMINB12, FOLATE, FERRITIN, TIBC, IRON, RETICCTPCT in the last 72 hours. Sepsis Labs: No results for input(s): PROCALCITON, LATICACIDVEN in the last 168 hours.  No results found for this or any previous visit (from the past 240 hour(s)).     Radiology Studies: No results found.    Scheduled Meds: . buprenorphine-naloxone  1 tablet Sublingual Daily  . buPROPion  150 mg Oral Daily  . famotidine  20 mg Oral BID  . feeding supplement (ENSURE ENLIVE)  237 mL Oral TID PC & HS  . senna-docusate  1 tablet Oral BID   Continuous Infusions: . pencillin G potassium IV 4 Million Units (01/31/18 0807)     LOS: 17 days    Time spent: 20 minutes   Noralee Stain, DO [facility] [email or web address] Password [facility] 01/31/2018, 8:27 AM

## 2018-02-01 MED ORDER — POLYETHYLENE GLYCOL 3350 17 G PO PACK
17.0000 g | PACK | Freq: Every day | ORAL | Status: DC
  Administered 2018-02-01 – 2018-02-08 (×8): 17 g via ORAL
  Filled 2018-02-01 (×9): qty 1

## 2018-02-01 MED ORDER — SENNOSIDES-DOCUSATE SODIUM 8.6-50 MG PO TABS
1.0000 | ORAL_TABLET | Freq: Every day | ORAL | Status: DC
  Administered 2018-02-01 – 2018-02-08 (×8): 1 via ORAL
  Filled 2018-02-01 (×8): qty 1

## 2018-02-01 MED ORDER — NICOTINE POLACRILEX 2 MG MT GUM
4.0000 mg | CHEWING_GUM | OROMUCOSAL | Status: DC | PRN
  Administered 2018-02-01 – 2018-02-09 (×17): 4 mg via ORAL
  Filled 2018-02-01 (×16): qty 2

## 2018-02-01 NOTE — Progress Notes (Addendum)
Physical Therapy Treatment Patient Details Name: Marcus ReidDana [name] MRN: [id number] DOB: 1975-12-07 Today's Date: 02/01/2018    History of Present Illness  42 yo homeless man. He was diagnosed with endocarditis in mid April and [facility] 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 Patient came  with exacerbation of his back pain. 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 PMH - hep c, polysubstance abuse.     PT Comments    Pt making steady progress. Working toward progressing away from the walker. Expect he will progress to be independent with mobility by the time his antibiotic finishes so if he remains in the hospital until then he likely won't need PT at [facility].  Follow Up Recommendations  SNF(if in hospital for remainder of antibiotics expect won't need)     Equipment Recommendations  Other (comment)(To be assessed)    Recommendations for Other Services       Precautions / Restrictions Precautions Precautions: Fall Restrictions Weight Bearing Restrictions: No    Mobility  Bed Mobility Overal bed mobility: Modified Independent                Transfers Overall transfer level: Modified independent Equipment used: None Transfers: Sit to/from Stand Sit to Stand: Modified independent (Device/Increase time)            Ambulation/Gait Ambulation/Gait assistance: Min guard Ambulation Distance (Feet): 150 Feet Assistive device: None Gait Pattern/deviations: Step-through pattern;Decreased stride length;Drifts right/left Gait velocity: decr Gait velocity interpretation: 1.31 - 2.62 ft/sec, indicative of limited community ambulator General Gait Details: Assist for safety. Pt needed encouragement to amb without walker. Reported lt foot pain if not using  walker   Stairs             Wheelchair Mobility    Modified Rankin (Stroke Patients Only)       Balance Overall balance assessment: Needs assistance Sitting-balance support: No upper extremity supported;Feet supported Sitting balance-Leahy Scale: Good     Standing balance support: No upper extremity supported;During functional activity Standing balance-Leahy Scale: Fair                              Cognition Arousal/Alertness: Awake/alert Behavior During Therapy: Flat affect Overall Cognitive Status: No family/caregiver present to determine baseline cognitive functioning Area of Impairment: Attention                   Current Attention Level: Selective                  Exercises      General Comments        Pertinent Vitals/Pain Pain Assessment: Faces Faces Pain Scale: Hurts a little bit Pain Location: feet Pain Descriptors / Indicators: Tingling Pain Intervention(s): Limited activity within patient's tolerance;Monitored during session    Home Living                      Prior Function            PT Goals (current goals can now be found in the care plan section) Progress towards PT goals: Progressing toward goals    Frequency    Min 3X/week      PT  Plan Frequency needs to be updated    Co-evaluation              AM-PAC PT "6 Clicks" Daily Activity  Outcome Measure  Difficulty turning over in bed (including adjusting bedclothes, sheets and blankets)?: None Difficulty moving from lying on back to sitting on the side of the bed? : None Difficulty sitting down on and standing up from a chair with arms (e.g., wheelchair, bedside commode, etc,.)?: A Little Help needed moving to and from a bed to chair (including a wheelchair)?: A Little Help needed walking in hospital room?: A Little Help needed climbing 3-5 steps with a railing? : A Little 6 Click Score: 20    End of Session   Activity Tolerance:  Patient tolerated treatment well Patient left: in bed;with call bell/phone within reach Nurse Communication: Mobility status PT Visit Diagnosis: Unsteadiness on feet (R26.81);Muscle weakness (generalized) (M62.81)     Time: 1610-9604 PT Time Calculation (min) (ACUTE ONLY): 9 min  Charges:  $Gait Training: 8-22 mins                    G Codes:       [facility] PT [phone]    Angelina Ok [facility] 02/01/2018, 2:54 PM

## 2018-02-01 NOTE — Progress Notes (Signed)
PROGRESS NOTE  Marcus [name]  NFA:[id number] DOB: 02/14/1976 DOA: 01/14/2018 PCP: [facility]   Brief Narrative: Marcus [name] is a 42 y.o. 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 The patient had a vegetation on the posterior leaflet of his mitral valve, and was found to have a subtotal occlusion of his aorta at the level of the IMA, as well as splenic and renal infarcts, treated with coumadin and antibiotics. He presented to the [facility] ED 5/15 with complaints of chest pain, back pain and found to be encephalopathic. 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 ID was consulted, patient placed on penicillin G. Blood cultures repeated at admission here remained negative. Coumadin was stopped due to ICH. TTE showed a 1.4 cm mobile vegetation on the posterior leaflet on the MV with severe regurgitation, though he's had no evidence of heart failure. Vascular srugery also consulted for distal aortoiliac occlusion, felt the patient could not undergo repair as there are no non-prosthetic options and this is contraindicated due to infection. Noted that distal pulses with monophasic with intact motor and sensation to both feet suggesting that these were chronic findings. 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 Palliative care was recommended and has followed with the patient, though he seems to have very few social supports.   Plan is to  continue IV antibiotics x6 weeks through 6/11, then indefinite oral amoxicillin.   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  - ID consulted, continue medical management with IV PCN x6 weeks, then indefinite amoxicillin.  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  - Not a candidate for vascular surgery.  - Palliative care consulted for his multiple life-threatening findings. Pt is DNR, though desires continued full measures. Will have palliative care services following at SNF. - Needs anticoagulation, but this is contraindicated due to ICH. Could consider heparin gtt and transition to oral anticoagulant if no deficits develop.  Polysubstance abuse and opioid use disorder: - Dr. Oswaldo Done has seen the patient to inform decisions regarding suboxone use. On 1 tab SL daily.   Tobacco use: Pt reports patches make cravings worse.  - Nicotine gum ordered prn, increased dose today.  -  Cessation counseling provided.   Depression:  - Restarted wellbutrin   Chronic hepatitis B and C:  - Will need to follow up  with ID as outpatient.  MCA CVA/ICH, M2 occlusion: Cerebral angio showed no cerebral aneurysm. - No further work up per neurology. Follow up in 4 weeks in stroke clinic.  Thrombocytosis and leukocytosis: Reactive to infection.  - Monitor intermittently  Anemia of chronic disease:  - Monitor intermittently, transfuse if below 7g/dl.   Constipation: Exam benign - Schedule laxatives, OOB.   DVT prophylaxis: SCDs Code Status: DNR Family Communication: None at bedside Disposition Plan: SNF unable to take due to use of suboxone. Will continue IV antibiotics through 6/11.  Consultants:   Cardiology  TCTS  Vascular surgery  ID  Palliative care  Procedures:   TTE on 5/16  Antimicrobials:  PCN G   Subjective: No changes, states 2mg  gum does just enough to help but 4mg  is usually better. Constipation starting.  Objective: BP (!) 96/53 (BP Location: Left Arm)   Pulse 70   Temp 98.3 F (36.8 C) (Oral)   Resp 16   Ht 5\' 11"  (1.803 m)   Wt 62.4 kg (137 lb 9.6 oz)   SpO2 100%   BMI 19.19 kg/m   Gen: Unkempt, thin 42 y.o. male in no distress  Pulm: Non-labored breathing. Clear to auscultation bilaterally.  CV: RRR III/VI apical systolic murmur, no JVD or edema. GI: Abdomen soft, non-tender, non-distended, with normoactive bowel sounds. No organomegaly or masses felt. Ext: Warm, no deformities. Very diminished DP pulses, cap refill fair Skin: Very mild right palmar erythema without nodules, tender. No splinter hemorrhages in 4 extremities. Neuro: Alert and oriented. No focal neurological deficits.  CBC: Recent Labs  Lab 01/31/18 0334  WBC 6.5  HGB 9.1*  HCT 31.0*  MCV 89.6  PLT 422*   Basic Metabolic Panel: Recent Labs  Lab 01/31/18 0334  NA 137  K 4.3  CL 101  CO2 27  GLUCOSE 89  BUN 16  CREATININE 0.64  CALCIUM 9.0   Scheduled Meds: . buprenorphine-naloxone  1 tablet Sublingual Daily  . buPROPion  150 mg Oral Daily  . famotidine  20 mg  Oral BID  . feeding supplement (ENSURE ENLIVE)  237 mL Oral TID PC & HS  . senna-docusate  1 tablet Oral BID   Continuous Infusions: . pencillin G potassium IV Stopped (02/01/18 0850)    Time spent: 15 minutes.  Tyrone Nineyan B Bryan Omura, MD [facility] [email or web address] Password [email or web address] 02/01/2018, 10:27 AM

## 2018-02-02 DIAGNOSIS — E440 Moderate protein-calorie malnutrition: Secondary | ICD-10-CM

## 2018-02-02 MED ORDER — SODIUM CHLORIDE 0.9 % IV BOLUS
500.0000 mL | Freq: Once | INTRAVENOUS | Status: AC
  Administered 2018-02-02: 500 mL via INTRAVENOUS

## 2018-02-02 NOTE — Progress Notes (Signed)
PROGRESS NOTE  Marcus [name]  ZOX:[id number] DOB: 05-24-1976 DOA: 01/14/2018 PCP: [facility]   Brief Narrative: Marcus [name] is a 42 y.o. 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 The patient had a vegetation on the posterior leaflet of his mitral valve, and was found to have a subtotal occlusion of his aorta at the level of the IMA, as well as splenic and renal infarcts, treated with coumadin and antibiotics. He presented to the [facility] ED 5/15 with complaints of chest pain, back pain and found to be encephalopathic. 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 ID was consulted, patient placed on penicillin G. Blood cultures repeated at admission here remained negative. Coumadin was stopped due to ICH. TTE showed a 1.4 cm mobile vegetation on the posterior leaflet on the MV with severe regurgitation, though he's had no evidence of heart failure. Vascular srugery also consulted for distal aortoiliac occlusion, felt the patient could not undergo repair as there are no non-prosthetic options and this is contraindicated due to infection. Noted that distal pulses with monophasic with intact motor and sensation to both feet suggesting that these were chronic findings. 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 Palliative care was recommended and has followed with the patient, though he seems to have very few social supports.   Plan is to  continue IV antibiotics x6 weeks through 6/11, then indefinite oral amoxicillin.   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  - ID consulted, continue medical management with IV PCN x6 weeks, then indefinite amoxicillin.  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  - Not a candidate for vascular surgery.  - Palliative care consulted for his multiple life-threatening findings. Pt is DNR, though desires continued full measures. Will have palliative care services following at SNF.  Polysubstance abuse and opioid use disorder: - Dr. Oswaldo Done has seen the patient to inform decisions regarding suboxone use. On 1 tab SL daily.   Tobacco use: Pt reports patches make cravings worse.  - Nicotine gum ordered prn, increased dose today.  - Cessation counseling provided.   Depression:  - Restarted wellbutrin   Chronic hepatitis B and C:  -  Will need to follow up with ID as outpatient.  MCA CVA/ICH, M2 occlusion: Cerebral angio showed no cerebral aneurysm. - No further work up  per neurology. Follow up in 4 weeks in stroke clinic.  Thrombocytosis and leukocytosis: Reactive to infection.  - Monitor intermittently  Anemia of chronic disease:  - Monitor intermittently, transfuse if below 7g/dl.   Moderate malnutrition:  - Continue Ensure Enlive po QID per RD  Constipation: Exam benign - Schedule laxatives, OOB.   DVT prophylaxis: SCDs Code Status: DNR Family Communication: None at bedside Disposition Plan: Per CSW, pt would not be placeable in SNF prior to completion of IV antibiotics. Will continue IV antibiotics through 6/11.  Consultants:   Cardiology  TCTS  Vascular surgery  ID  Palliative care  Procedures:   TTE on 5/16  Antimicrobials:  PCN G   Subjective: Cravings improved on 4mg  nicorette. No other complaints. Wanted to know if he could get into SNF if we stopped suboxone. I discussed with CSW who stated this would still not guarantee him a spot and he would be unlikely to be placed prior to completion of IV antibiotics.   Objective: BP (!) 89/54 (BP Location: Left Arm)   Pulse 81   Temp 98.4 F (36.9 C) (Oral)   Resp 18   Ht 5\' 11"  (1.803 m)   Wt 62.8 kg (138 lb 8 oz)   SpO2 100%   BMI 19.32 kg/m   Gen: Unkempt, malnourished 42 y.o. male in no distress  Pulm: Non-labored breathing. Clear to auscultation bilaterally.  CV: RRR III/VI apical systolic murmur unchanged, no JVD or edema. GI: Abdomen soft, non-tender, non-distended, with normoactive bowel sounds. No organomegaly or masses felt. Ext: Warm, no deformities. Very diminished DP pulses, cap refill fair Skin: Very mild right palmar erythema without nodules. Mildly tender. No splinter hemorrhages in 4 extremities. Neuro: Alert and oriented. No focal neurological deficits.  CBC: Recent Labs  Lab 01/31/18 0334  WBC 6.5  HGB 9.1*  HCT 31.0*  MCV 89.6  PLT 422*   Basic Metabolic Panel: Recent Labs  Lab 01/31/18 0334  NA 137  K 4.3  CL 101  CO2 27    GLUCOSE 89  BUN 16  CREATININE 0.64  CALCIUM 9.0   Scheduled Meds: . buprenorphine-naloxone  1 tablet Sublingual Daily  . buPROPion  150 mg Oral Daily  . famotidine  20 mg Oral BID  . feeding supplement (ENSURE ENLIVE)  237 mL Oral TID PC & HS  . polyethylene glycol  17 g Oral Daily  . senna-docusate  1 tablet Oral QHS   Continuous Infusions: . pencillin G potassium IV Stopped (02/02/18 1235)    Time spent: 15 minutes.  Tyrone Nineyan B Lacoya Wilbanks, MD [facility] [email or web address] Password [email or web address] 02/02/2018, 3:09 PM

## 2018-02-02 NOTE — Progress Notes (Signed)
Initial Nutrition Assessment  DOCUMENTATION CODES:   Non-severe (moderate) malnutrition in context of social or environmental circumstances  INTERVENTION:  Continue Ensure Enlive po QID, each supplement provides 350 kcal and 20 grams of protein  NUTRITION DIAGNOSIS:   Moderate Malnutrition related to social / environmental circumstances(homelessness, food insecurity) as evidenced by moderate fat depletion, moderate muscle depletion, energy intake < 75% for > or equal to 3 months.  GOAL:   Patient will meet greater than or equal to 90% of their needs   MONITOR:   PO intake, Supplement acceptance  REASON FOR ASSESSMENT:   LOS    ASSESSMENT:   Patient with PMH Polysubstance abuse, homelessness, chronic Hep B and C, depression. [facility] while receiving treatment for S. Pneumoniae endocarditis with subsequent aortic septic thrombus. [facility] with chest pain, back pain, and exhibiting encephalopathy. 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 Currently on IV ABx for S Pneumoniae endocarditis  RD drawn to patient for LOS. Spoke with patient at bedside. He reports UBW of 140 ponds recently. States about 2-3 months ago he was in jail and weighed around 100 pounds, but has rapidly gained weight since. He is homeless, and food insecure. Normally eats once a day, maybe eating a sandwich or ramen noodles. Denies weight loss.  Has been eating 50-100% since admit. Also drinking Ensure QID. No complaints currently.  Labs reviewed Medications reviewed and include:  Miralax, Senokot-S  NUTRITION - FOCUSED PHYSICAL EXAM:    Most Recent Value  Orbital Region  Moderate depletion  Upper Arm Region  Moderate depletion  Thoracic and Lumbar Region  Moderate depletion  Buccal Region  Moderate depletion  Temple Region  Moderate depletion  Clavicle Bone Region  Moderate depletion  Clavicle and Acromion Bone Region  Moderate depletion   Scapular Bone Region  Moderate depletion  Dorsal Hand  Moderate depletion  Patellar Region  Moderate depletion  Anterior Thigh Region  Moderate depletion  Posterior Calf Region  Moderate depletion       Diet Order:   Diet Order           Diet regular Room service appropriate? Yes; Fluid consistency: Thin  Diet effective now          EDUCATION NEEDS:   Not appropriate for education at this time  Skin:  Skin Assessment: Reviewed RN Assessment  Last BM:  02/01/2018  Height:   Ht Readings from Last 1 Encounters:  01/20/18 5\' 11"  (1.803 m)    Weight:   Wt Readings from Last 1 Encounters:  02/02/18 138 lb 8 oz (62.8 kg)    Ideal Body Weight:  78.18 kg  BMI:  Body mass index is 19.32 kg/m.  Estimated Nutritional Needs:   Kcal:  1900-2200 calories  Protein:  95-107 grams (1.5-1.7g/kg)  Fluid:  >1.9L  Dionne AnoWilliam M. Alleyne Lac, MS, RD LDN Inpatient Clinical Dietitian Pager [phone]

## 2018-02-02 NOTE — Progress Notes (Addendum)
Palliative note:   Noted patient is progressing well. Plan to complete IV antibiotics and discharge. According to PT note will likely progress past need for SNF by the time IV antibiotics are needed. GOC have been set for patient- continued aggressive care with limit being DNR.   Palliative will sign off. Please reconsult if further assistance is needed.   Ocie BobKasie Mahan, AGNP-C Palliative Medicine  Please call Palliative Medicine team phone with any questions [phone]. For individual providers please see AMION.

## 2018-02-03 NOTE — Progress Notes (Signed)
Physical Therapy Treatment Patient Details Name: Marcus ReidDana [name] MRN: [id number] DOB: 16-Jun-1976 Today's Date: 02/03/2018    History of Present Illness  42 yo homeless man. He was diagnosed with endocarditis in mid April and [facility]. 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 Patient came  with exacerbation of his back pain. 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 PMH - hep c, polysubstance abuse.     PT Comments    Patient progressing well with mobility but bilateral foot pain remains limiting. Patient steady with device. VSS. Patient provided with adult coloring book and colored pencils for comfort, he was very appreciative. Will continue to see and progress as tolerated.   Follow Up Recommendations  SNF(if in hospital for remainder of antibiotics expect won't nee)     Equipment Recommendations  Other (comment)(To be assessed)    Recommendations for Other Services       Precautions / Restrictions Precautions Precautions: Fall Restrictions Weight Bearing Restrictions: No    Mobility  Bed Mobility Overal bed mobility: Modified Independent                Transfers Overall transfer level: Modified independent Equipment used: None Transfers: Sit to/from Stand Sit to Stand: Modified independent (Device/Increase time)            Ambulation/Gait Ambulation/Gait assistance: Supervision Ambulation Distance (Feet): 310 Feet Assistive device: Rolling walker (2 wheeled) Gait Pattern/deviations: Step-through pattern;Decreased stride length;Drifts right/left Gait velocity: decr Gait velocity interpretation: 1.31 - 2.62 ft/sec, indicative of limited community ambulator General Gait Details: patient ambulated with RW due to bilateral foot pain, no  physical assist. Steady with Rw   Stairs             Wheelchair Mobility    Modified Rankin (Stroke Patients Only)       Balance Overall balance assessment: Needs assistance Sitting-balance support: No upper extremity supported;Feet supported Sitting balance-Leahy Scale: Good     Standing balance support: No upper extremity supported;During functional activity Standing balance-Leahy Scale: Good Standing balance comment: able to move around without UE support                            Cognition Arousal/Alertness: Awake/alert Behavior During Therapy: WFL for tasks assessed/performed Overall Cognitive Status: Within Functional Limits for tasks assessed                                        Exercises      General Comments General comments (skin integrity, edema, etc.): VSS      Pertinent Vitals/Pain Pain Assessment: Faces Faces Pain Scale: Hurts even more Pain Location: feet Pain Descriptors / Indicators: Sore Pain Intervention(s): Monitored during session    Home Living                      Prior Function            PT Goals (current goals can now be found in the care plan section) Acute Rehab PT Goals Patient Stated Goal: to get stronger Progress towards PT goals: Progressing toward goals    Frequency  Min 3X/week      PT Plan Frequency needs to be updated    Co-evaluation              AM-PAC PT "6 Clicks" Daily Activity  Outcome Measure  Difficulty turning over in bed (including adjusting bedclothes, sheets and blankets)?: None Difficulty moving from lying on back to sitting on the side of the bed? : None Difficulty sitting down on and standing up from a chair with arms (e.g., wheelchair, bedside commode, etc,.)?: None Help needed moving to and from a bed to chair (including a wheelchair)?: A Little Help needed walking in hospital room?: A Little Help needed climbing 3-5 steps with a railing?  : A Little 6 Click Score: 21    End of Session Equipment Utilized During Treatment: Gait belt Activity Tolerance: Patient tolerated treatment well Patient left: in bed;with call bell/phone within reach Nurse Communication: Mobility status PT Visit Diagnosis: Unsteadiness on feet (R26.81);Muscle weakness (generalized) (M62.81)     Time: 4782-9562 PT Time Calculation (min) (ACUTE ONLY): 14 min  Charges:  $Gait Training: 8-22 mins                    G Codes:       Charlotte Crumb, PT DPT  Board Certified Neurologic Specialist [phone]    Fabio Asa 02/03/2018, 3:58 PM

## 2018-02-03 NOTE — Progress Notes (Signed)
[facility]                                                                              Patient Demographics  Marcus ReidDana [name], is a 42 y.o. male, DOB - Dec 29, 1975, ZOX:[id number]  Admit date - 01/14/2018   Admitting Physician Otho Najjarobert J Rosenblatt, MD  Outpatient Primary MD for the patient is [facility]  Outpatient specialists:   LOS - 20  days   Medical records reviewed and are as summarized below:    Chief Complaint  Patient presents with  . 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 The patient had a vegetation on the posterior leaflet of his mitral valve, and was found to have a subtotal occlusion of his aorta at the level of the IMA, as well as splenic and renal infarcts, treated with coumadin and antibiotics. [facility] with complaints of chest pain, back pain and found to be encephalopathic. 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 ID was consulted, patient placed on penicillin G. Blood cultures repeated at admission here remained negative. Coumadin was stopped due to ICH. TTE showed a 1.4 cm mobile vegetation on the posterior leaflet on the MV with severe regurgitation, though he's had no evidence of heart failure. Vascular srugery also consulted for distal aortoiliac occlusion, felt the patient could not undergo repair as there are no non-prosthetic options and this is contraindicated due to infection. Noted that distal pulses with monophasic with intact motor and sensation to both feet suggesting that these were chronic findings. 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 Palliative care was recommended and has followed with the patient, though he seems to have very few social supports.  Plan is to continue IV antibiotics x6 weeks through 6/11, then indefinite oral amoxicillin.    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  ID was consulted  -ID recommended IV penicillin for 6 weeks till 6/11 then indefinite amoxicillin. 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 -Palliative consulted, DNR  Polysubstance abuse, opioid use disorder Continue Suboxone, Dr. Oswaldo Done has seen the patient-   Depression Continue Wellbutrin-   Tobacco use Continue nicotine gum  Chronic hepatitis B and C -Outpatient follow-up with ID   MCA CVA/ICH, M2 occlusion -Cerebral angiogram showed no cerebral aneurysm, no further work-up per neurology, follow-up outpatient in 4 weeks with stroke clinic  Thrombocytosis, leukocytosis Reactive  Anemia of chronic disease Currently stable, transfuse if below 7.5  Moderate  malnutrition Continue nutritional supplements  Code Status: DNR DVT Prophylaxis:   SCD's Family Communication: Discussed in detail with the patient, all imaging results, lab results explained to the patient    Disposition Plan:  Time Spent in minutes  25 minutes  Procedures:  TTE on 5/16  Consultants:   Cardiology CT VS Vascular surgery ID Palliative care  Antimicrobials:   Penicillin G   Medications  Scheduled Meds: . buprenorphine-naloxone  1 tablet Sublingual Daily  . buPROPion  150 mg Oral Daily  . famotidine  20 mg Oral BID  . feeding supplement (ENSURE ENLIVE)  237 mL Oral TID PC & HS  . polyethylene glycol  17 g Oral Daily  . senna-docusate  1 tablet Oral QHS   Continuous Infusions: . pencillin G potassium IV 4 Million Units (02/03/18 1151)   PRN Meds:.acetaminophen, bisacodyl, nicotine polacrilex, RESOURCE THICKENUP CLEAR   Antibiotics   Anti-infectives (From admission, onward)   Start     Dose/Rate Route Frequency Ordered Stop   01/19/18 1600  penicillin G potassium 4 Million Units in dextrose 5 % 250 mL IVPB     4 Million Units 250 mL/hr over 60 Minutes Intravenous Every 4 hours 01/19/18 1424 02/09/18 2359   01/18/18 2200  vancomycin (VANCOCIN) IVPB 750 mg/150 ml premix  Status:  Discontinued     750 mg 150 mL/hr over 60 Minutes Intravenous Every 8 hours 01/18/18 1431 01/19/18 1424   01/14/18 2000  vancomycin (VANCOCIN) IVPB 1000 mg/200 mL premix  Status:  Discontinued     1,000 mg 200 mL/hr over 60 Minutes Intravenous Every 8 hours 01/14/18 1103 01/18/18 1431   01/14/18 2000  piperacillin-tazobactam (ZOSYN) IVPB 3.375 g  Status:  Discontinued     3.375 g 12.5 mL/hr over 240 Minutes Intravenous Every 8 hours 01/14/18 1103 01/16/18 1211   01/14/18 1100  vancomycin (VANCOCIN) 1,250 mg in sodium chloride 0.9 % 250 mL IVPB     1,250 mg 166.7 mL/hr over 90 Minutes Intravenous  Once 01/14/18 1036 01/14/18 1631   01/14/18 1045  piperacillin-tazobactam  (ZOSYN) IVPB 3.375 g     3.375 g 100 mL/hr over 30 Minutes Intravenous  Once 01/14/18 1036 01/14/18 1228        Subjective:   Marcus [name] was seen and examined today.  No acute complaints. Patient denies dizziness, chest pain, shortness of breath, abdominal pain, N/V/D/C,  No acute events overnight.  No fevers or chills  Objective:   Vitals:   02/02/18 2000 02/02/18 2046 02/03/18 0557 02/03/18 1219  BP: (!) 79/56 92/60 94/63  (!) 99/53  Pulse: 82 86  87  Resp:   18 18  Temp:   98.3 F (36.8 C) 98.5 F (36.9 C)  TempSrc:   Oral Oral  SpO2:   95% 93%  Weight:   62.8 kg (138 lb 7.5 oz)   Height:        Intake/Output Summary (Last 24 hours) at 02/03/2018 1247 Last data filed at 02/03/2018 1224 Gross per 24  hour  Intake 240 ml  Output 1550 ml  Net -1310 ml     Wt Readings from Last 3 Encounters:  02/03/18 62.8 kg (138 lb 7.5 oz)  01/14/18 63.5 kg (140 lb)  01/13/18 63.5 kg (140 lb)     Exam  General: Alert and oriented x 3, NAD  Eyes:   HEENT:  Atraumatic, normocephalic, normal oropharynx  Cardiovascular: S1 S2 auscultated, 3/6 apical systolic murmur, Regular rate and rhythm.  Respiratory: Clear to auscultation bilaterally, no wheezing, rales or rhonchi  Gastrointestinal: Soft, nontender, nondistended, + bowel sounds  Ext: no pedal edema bilaterally  Neuro: no new deficits  Musculoskeletal: No digital cyanosis, clubbing  Skin: No rashes  Psych: Normal affect and demeanor, alert and oriented x3    Data Reviewed:  I have personally reviewed following labs and imaging studies  Micro Results No results found for this or any previous visit (from the past 240 hour(s)).  Radiology Reports Dg Chest 2 View  Result Date: 01/14/2018 CLINICAL DATA:  Left chest pain EXAM: CHEST - 2 VIEW COMPARISON:  01/13/2018 FINDINGS: Heart and mediastinal contours are within normal limits. No focal opacities or effusions. No acute bony abnormality. IMPRESSION: No active  cardiopulmonary disease. Electronically Signed   By: Charlett Nose M.D.   On: 01/14/2018 08:29   Dg Chest 2 View  Result Date: 01/13/2018 CLINICAL DATA:  Weakness, hungry, pain.  History of hepatitis-C. EXAM: CHEST - 2 VIEW COMPARISON:  None. FINDINGS: The heart size and mediastinal contours are within normal limits. Both lungs are clear. The visualized skeletal structures are unremarkable. IMPRESSION: Negative. Electronically Signed   By: Awilda Metro M.D.   On: 01/13/2018 19:46   Ct Head Wo Contrast  Result Date: 01/24/2018 CLINICAL DATA:  Continued surveillance infarcts and hemorrhage. EXAM: CT HEAD WITHOUT CONTRAST TECHNIQUE: Contiguous axial images were obtained from the base of the skull through the vertex without intravenous contrast. COMPARISON:  CT head 01/14/2018. MR head 01/14/2018. CT angiogram 01/18/2018. FINDINGS: Brain: Previously identified subacute infarcts are involving into areas of chronic ischemia affecting the BILATERAL RIGHT greater than LEFT subcortical white matter. Tiny focus of hyperattenuation, RIGHT frontal cortex, image 23 appears improved. This measures no more than 2 x 3 mm on today's exam. Vascular: Improved appearance of the previously identified RIGHT M2 occlusion. Some hyperattenuation of vessel wall in the sylvian fissure, see series 3, image 12. Skull: Normal. Negative for fracture or focal lesion. Sinuses/Orbits: No acute finding. Other: None. IMPRESSION: Previously identified subacute white matter infarcts are have evolved into areas of chronic ischemia. Tiny focus of hyperattenuation RIGHT frontal cortex is improved. Improved appearance of the previously identified RIGHT M2 occlusion. Electronically Signed   By: Elsie Stain M.D.   On: 01/24/2018 17:38   Ct Head Wo Contrast  Addendum Date: 01/14/2018   ADDENDUM REPORT: 01/14/2018 10:35 ADDENDUM: Study discussed by telephone with Dr. Shaune Pollack on 01/14/2018 at 1015 hours. He advises a history of  Endocarditis in this patient. THEREFORE, the appearance is highly likely to reflect SEPTIC EMBOLI, with acute embolic related hemorrhage in the right superior frontal lobe. Electronically Signed   By: Odessa Fleming M.D.   On: 01/14/2018 10:35   Result Date: 01/14/2018 CLINICAL DATA:  42 year old male with altered level consciousness, lightheadedness and dizzy onset about 1 hour ago. EXAM: CT HEAD WITHOUT CONTRAST TECHNIQUE: Contiguous axial images were obtained from the base of the skull through the vertex without intravenous contrast. COMPARISON:  None. FINDINGS: Brain: Cytotoxic edema at  the right anterior insula and frontal operculum. No associated hemorrhage or mass effect in those segments. Small amorphous 7 millimeter focus of hyperdensity at the right superior frontal gyrus on series 3, image 25 and sagittal image 26. On coronal images there is adjacent hypodensity (coronal image 34). No regional mass effect. No other cytotoxic edema identified. ASPECTS 7 (abnormal insula, M4, M 5). More chronic appearing left corona radiata white matter hypodensity on the left. No intracranial mass effect or midline shift. No ventriculomegaly. Normal basilar cisterns. Vascular: Abnormal cylindrical hyperdensity at the right MCA proximal M2 segment with Vessel expansion (series 3, image 14 and series 6, image 21). Skull: No acute osseous abnormality identified. Sinuses/Orbits: Visualized paranasal sinuses and mastoids are well pneumatized. Other: Mildly Disconjugate gaze, otherwise negative orbits soft tissues. No acute scalp soft tissue findings, scattered benign appearing scalp dermal nodularity. IMPRESSION: 1. 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 2. Possible trace focus of acute hemorrhage in the right superior frontal gyrus on series 3, image 25. No other acute intracranial hemorrhage identified. 3. Right MCA territory cytotoxic edema, ASPECTS 7 (although note suspicion of trace acute hemorrhage in  #2). 4. Chronic appearing left MCA white matter disease. Electronically Signed: By: Odessa Fleming M.D. On: 01/14/2018 10:18   Ct Angio Abdomen W &/or Wo Contrast  Result Date: 01/21/2018 CLINICAL DATA:  Aorta occlusion EXAM: CT ANGIOGRAPHY ABDOMEN TECHNIQUE: Multidetector CT imaging of the abdomen was performed using the standard protocol during bolus administration of intravenous contrast. Multiplanar reconstructed images and MIPs were obtained and reviewed to evaluate the vascular anatomy. CONTRAST:  ISOVUE-370 IOPAMIDOL (ISOVUE-370) INJECTION 76% COMPARISON:  12/29/2017 at [facility], report only FINDINGS: VASCULAR Aorta: The aorta is abruptly occluded just below the takeoff of the IMA. The borders of the abdominal aorta are poorly defined at the level of the occlusion. Mild aneurysmal dilatation of the distal aorta measuring up to 3.7 cm is suspected. At the level of the occlusion, there is mixed low-density and isodensity within the thrombus, as well as a lack of atherosclerotic calcification, suggesting subacute rather than acute age. There is also a mild thickening of the wall of the aorta with stranding in the adjacent fat. These findings suggest an inflammatory process. Celiac: Patent and ectatic. There is abrupt occlusion of the splenic artery, 2 cm from the hilum of the spleen. Distal branches reconstitute. The hepatic artery and its branches are grossly patent. Gastroduodenal artery is patent. SMA: Patent and ectatic. Renals: 2 right renal arteries and a single left renal artery are patent. IMA: Patent. Inflow: Bilateral common iliac arteries are occluded. There is some filling of the mid and distal left common iliac artery with contrast flow. The left common iliac artery measures 1.9 cm in maximal caliber. Right common iliac artery measures 3.6 cm in caliber. The left internal and external iliac arteries reconstitute and are partially imaged. Veins: No evidence of DVT. Review of  the MIP images confirms the above findings. NON-VASCULAR Lower chest: No acute abnormality. Hepatobiliary: Unremarkable Pancreas: Unremarkable Spleen: There is a large nonspecific there is a complex cystic lesion in the spleen measuring up to 11.6 cm. Adrenals/Urinary Tract: There is scarring in the lower pole of the left kidney associated with wedge-shaped areas of low density. These findings likely represent small infarcts. This is also present in the upper pole of the right kidney to a lesser degree. Stomach/Bowel: No obvious mass in the colon. No evidence of small-bowel obstruction. Lymphatic:  Small para-aortic lymph nodes are present. Other: No free fluid.  Postoperative changes from appendectomy. Musculoskeletal: No vertebral compression deformity. Bilateral L5 pars defects. No evidence of anterolisthesis. IMPRESSION: VASCULAR There is abrupt occlusion of the aorta below the level of the IMA as described on the prior report. Mixed density within the aorta suggest subacute age. Also, there is thickening and inflammatory change of the wall of the aorta suggesting vasculitis or mycotic aneurysm. There is also associated occlusion of the bilateral common iliac arteries. Focal occlusion of the splenic artery. NON-VASCULAR Complex cystic lesion in the spleen is nonspecific. Small infarcts in scarring in the kidneys bilaterally. Electronically Signed   By: Jolaine Click M.D.   On: 01/21/2018 12:05   Mr Maxine Glenn Head Wo Contrast  Result Date: 01/14/2018 CLINICAL DATA:  In hospital for sepsis, altered mental status. History of hepatitis-C, polysubstance abuse and endocarditis. History of head injury 1 month ago. Follow-up RIGHT MCA stroke. EXAM: MRI HEAD WITHOUT CONTRAST MRA HEAD WITHOUT CONTRAST TECHNIQUE: Multiplanar, multiecho pulse sequences of the brain and surrounding structures were obtained without intravenous contrast. Angiographic images of the head were obtained using MRA technique without contrast.  COMPARISON:  CT HEAD Jan 14, 2018 at 0951 hours. FINDINGS: MRI HEAD FINDINGS Multiple sequences are mildly or moderately motion degraded. INTRACRANIAL CONTENTS: Patchy reduced diffusion RIGHT frontal lobe and RIGHT basal ganglia, LEFT posterior frontal lobe with normalized ADC values and focal T2 shine through. Associated FLAIR T2 hyperintense signal and faint hemosiderin staining. Slightly expanded FLAIR T2 hyperintense RIGHT basal ganglia. Superimposed focal susceptibility artifact bifrontal lobes, RIGHT parietooccipital lobe. Multiple additional scattered micro hemorrhages. Mild parenchymal brain volume loss. No hydrocephalus. Old small RIGHT cerebellar infarcts. VASCULAR: Susceptibility artifact and T1 shortening RIGHT sylvian fissure corresponding to MCA density. SKULL AND UPPER CERVICAL SPINE: No abnormal sellar expansion. No suspicious calvarial bone marrow signal. Craniocervical junction maintained. SINUSES/ORBITS: The mastoid air-cells and included paranasal sinuses are well-aerated.The included ocular globes and orbital contents are non-suspicious. OTHER: None. MRA HEAD FINDINGS ANTERIOR CIRCULATION: Normal flow related enhancement of the included cervical, petrous, cavernous and supraclinoid internal carotid arteries. Patent anterior communicating artery. Mildly attenuated RIGHT MCA flow related enhancement, occluded RIGHT M2 segment with transient aneurysmal reconstitution. No flow limiting stenosis. POSTERIOR CIRCULATION: vertebral artery is dominant. Basilar artery is patent, with normal flow related enhancement of the main branch vessels. Patent posterior cerebral arteries. Robust RIGHT and smaller LEFT posterior communicating arteries present. No large vessel occlusion, flow limiting stenosis,  aneurysm. ANATOMIC VARIANTS: None. Source images and MIP images were reviewed. IMPRESSION: MRI HEAD: 1. Multifocal subacute bilateral MCA/watershed territory infarcts. 2. Peripheral susceptibility artifact,  septic emboli suspected. There may be superimposed shear injury or underlying chronic hypertension. 3. Mild parenchymal brain volume loss for age. 4. Old small RIGHT cerebellar infarcts. MRA HEAD: 1. 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 2. Otherwise negative MRA head. Examination reviewed by Dr. Augusto Gamble with Dr. Wilford Corner, Neurology on Jan 14, 2018 at approximately 1425 hours. Electronically Signed   By: Awilda Metro M.D.   On: 01/14/2018 14:38   Mr Brain Wo Contrast  Result Date: 01/14/2018 CLINICAL DATA:  In hospital for sepsis, altered mental status. History of hepatitis-C, polysubstance abuse and endocarditis. History of head injury 1 month ago. Follow-up RIGHT MCA stroke. EXAM: MRI HEAD WITHOUT CONTRAST MRA HEAD WITHOUT CONTRAST TECHNIQUE: Multiplanar, multiecho pulse sequences of the brain and surrounding structures were obtained without intravenous contrast. Angiographic images  of the head were obtained using MRA technique without contrast. COMPARISON:  CT HEAD Jan 14, 2018 at 0951 hours. FINDINGS: MRI HEAD FINDINGS Multiple sequences are mildly or moderately motion degraded. INTRACRANIAL CONTENTS: Patchy reduced diffusion RIGHT frontal lobe and RIGHT basal ganglia, LEFT posterior frontal lobe with normalized ADC values and focal T2 shine through. Associated FLAIR T2 hyperintense signal and faint hemosiderin staining. Slightly expanded FLAIR T2 hyperintense RIGHT basal ganglia. Superimposed focal susceptibility artifact bifrontal lobes, RIGHT parietooccipital lobe. Multiple additional scattered micro hemorrhages. Mild parenchymal brain volume loss. No hydrocephalus. Old small RIGHT cerebellar infarcts. VASCULAR: Susceptibility artifact and T1 shortening RIGHT sylvian fissure corresponding to MCA density. SKULL AND UPPER CERVICAL SPINE: No abnormal sellar expansion. No suspicious calvarial bone marrow  signal. Craniocervical junction maintained. SINUSES/ORBITS: The mastoid air-cells and included paranasal sinuses are well-aerated.The included ocular globes and orbital contents are non-suspicious. OTHER: None. MRA HEAD FINDINGS ANTERIOR CIRCULATION: Normal flow related enhancement of the included cervical, petrous, cavernous and supraclinoid internal carotid arteries. Patent anterior communicating artery. Mildly attenuated RIGHT MCA flow related enhancement, occluded RIGHT M2 segment with transient aneurysmal reconstitution. No flow limiting stenosis. POSTERIOR CIRCULATION: vertebral artery is dominant. Basilar artery is patent, with normal flow related enhancement of the main branch vessels. Patent posterior cerebral arteries. Robust RIGHT and smaller LEFT posterior communicating arteries present. No large vessel occlusion, flow limiting stenosis,  aneurysm. ANATOMIC VARIANTS: None. Source images and MIP images were reviewed. IMPRESSION: MRI HEAD: 1. Multifocal subacute bilateral MCA/watershed territory infarcts. 2. Peripheral susceptibility artifact, septic emboli suspected. There may be superimposed shear injury or underlying chronic hypertension. 3. Mild parenchymal brain volume loss for age. 4. Old small RIGHT cerebellar infarcts. MRA HEAD: 1. 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 2. Otherwise negative MRA head. Examination reviewed by Dr. Augusto Gamble with Dr. Wilford Corner, Neurology on Jan 14, 2018 at approximately 1425 hours. Electronically Signed   By: Awilda Metro M.D.   On: 01/14/2018 14:38   Dg Chest Port 1 View  Result Date: 01/15/2018 CLINICAL DATA:  Shortness of breath.  Acute mental status change. EXAM: PORTABLE CHEST 1 VIEW COMPARISON:  Jan 14, 2018 FINDINGS: No pneumothorax. Vascular crowding in the medial right lung base. No suspicious infiltrate. Mild atelectasis in the left base. The  cardiomediastinal silhouette is stable. No other changes. IMPRESSION: Mild atelectasis in the left base.  No other significant change. Electronically Signed   By: Gerome Sam III M.D   On: 01/15/2018 07:40   Ir Angio Intra Extracran Sel Com Carotid Innominate Bilat Mod Sed  Result Date: 01/19/2018 CLINICAL DATA:  Focal right frontal hemorrhage. 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 EXAM: IR ANGIO VERTEBRAL SEL VERTEBRAL UNI RIGHT MOD SED; IR ULTRASOUND GUIDANCE VASC ACCESS RIGHT; IR ANGIO VERTEBRAL SEL SUBCLAVIAN INNOMINATE UNI LEFT MOD SED; BILATERAL COMMON CAROTID AND INNOMINATE ANGIOGRAPHY COMPARISON:  MRI MRA of the brain of 01/14/2018. MEDICATIONS: Heparin 0 units IV; no antibiotic was administered within 1 hour of the procedure. ANESTHESIA/SEDATION: Versed 0.5 mg IV; Fentanyl 37.5 mcg IV. Moderate Sedation Time:  75 minutes. The patient was continuously monitored during the procedure by the interventional radiology nurse under my direct supervision. CONTRAST:  Isovue 300 approximately 60 mL. FLUOROSCOPY TIME:  Fluoroscopy Time: 8 minutes 42 seconds (822 mGy). COMPLICATIONS: None immediate. TECHNIQUE: Informed written consent was obtained from the patient after a thorough discussion of the procedural risks, benefits and alternatives. All questions were addressed.  Maximal Sterile Barrier Technique was utilized including caps, mask, sterile gowns, sterile gloves, sterile drape, hand hygiene and skin antiseptic. A timeout was performed prior to the initiation of the procedure. The right antecubital fossa was prepped and draped in the usual sterile fashion. Thereafter using modified Seldinger technique, transbrachial access into the right brachial artery was obtained without difficulty. Over a 0.035 inch guidewire, a 5 French Pinnacle sheath was inserted. Through this, and also over 0.035 inch guidewire, a 5 French Simmons 2 catheter was advanced to the aortic arch region and  formed without difficulty. It was selectively positioned in the right common carotid artery, the right vertebral artery, the left common carotid artery and the left subclavian artery. FINDINGS: The right vertebral artery origin is widely patent. The vessel is seen to opacify normally to the cranial skull base. Wide patency is seen of the right vertebrobasilar junction and the right posterior-inferior cerebellar artery. The opacified portion of the basilar artery, the posterior cerebral arteries, the superior cerebellar arteries and the anterior-inferior cerebellar arteries appear normal into the capillary and venous phases. Non-opacified blood is seen in the basilar artery from contralateral vertebral artery. The left common carotid arteriogram demonstrates the left external carotid artery and its major branches to be widely patent. The left internal carotid artery at the bulb to the cranial skull base is widely patent. There is mild tortuosity in the proximal 1/3 of the left internal carotid artery. The petrous, cavernous and the supraclinoid segments are widely patent. The left middle cerebral artery and the left anterior cerebral artery opacify normally into the capillary and venous phases. The venous phase demonstrates retrograde opacification of the left transverse sinus in its proximal 2/3 primarily from the ipsilateral vein of Labbe. The right common carotid arteriogram demonstrates the right external carotid artery and its major branches to be widely patent. The right internal carotid artery at the bulb to the cranial skull base is widely patent. There is a double U shaped tortuosity of the middle 1/3 of the right internal carotid artery without evidence of kinking. More distally, the right internal carotid artery is seen to opacify normally to the cranial skull base. The distal petrous, and the proximal cavernous segments demonstrate mild fusiform dilatation. Distal to this the supraclinoid right ICA is  normally patent. The right middle cerebral artery and the right anterior cerebral artery demonstrate normal opacification into the capillary and venous phases. The right transverse sinus consistently demonstrates focal areas of moderate irregularity with questionable stenosis at its distal aspect. The left vertebral artery origin demonstrates mild stenosis. The vessel is seen to opacify normally to the cranial skull base. Wide patency is seen of the left vertebrobasilar junction and the left posterior-inferior cerebellar artery. The opacified portion of the distal left vertebrobasilar junction, the basilar artery, the posterior cerebral arteries, the superior cerebellar arteries and the anterior-cerebellar arteries is grossly patent into the delayed arterial phase. IMPRESSION: Angiographically no evidence of intracranial aneurysms, arteriovenous malformations, dural AV fistula, dissections, or of intraluminal filling defects. Venous outflow demonstrates areas of caliber irregularity involving the right transverse sinus with suspicion of stenosis. However, patient motion precludes fine detail in this region. PLAN: As per referring physician. Electronically Signed   By: Julieanne Cotton M.D.   On: 01/18/2018 13:10   Ir Angio Vertebral Sel Subclavian Innominate Uni L Mod Sed  Result Date: 01/19/2018 CLINICAL DATA:  Focal right frontal hemorrhage. 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 EXAM: IR ANGIO VERTEBRAL SEL VERTEBRAL  UNI RIGHT MOD SED; IR ULTRASOUND GUIDANCE VASC ACCESS RIGHT; IR ANGIO VERTEBRAL SEL SUBCLAVIAN INNOMINATE UNI LEFT MOD SED; BILATERAL COMMON CAROTID AND INNOMINATE ANGIOGRAPHY COMPARISON:  MRI MRA of the brain of 01/14/2018. MEDICATIONS: Heparin 0 units IV; no antibiotic was administered within 1 hour of the procedure. ANESTHESIA/SEDATION: Versed 0.5 mg IV; Fentanyl 37.5 mcg IV. Moderate Sedation Time:  75 minutes. The patient was continuously monitored during the  procedure by the interventional radiology nurse under my direct supervision. CONTRAST:  Isovue 300 approximately 60 mL. FLUOROSCOPY TIME:  Fluoroscopy Time: 8 minutes 42 seconds (822 mGy). COMPLICATIONS: None immediate. TECHNIQUE: Informed written consent was obtained from the patient after a thorough discussion of the procedural risks, benefits and alternatives. All questions were addressed. Maximal Sterile Barrier Technique was utilized including caps, mask, sterile gowns, sterile gloves, sterile drape, hand hygiene and skin antiseptic. A timeout was performed prior to the initiation of the procedure. The right antecubital fossa was prepped and draped in the usual sterile fashion. Thereafter using modified Seldinger technique, transbrachial access into the right brachial artery was obtained without difficulty. Over a 0.035 inch guidewire, a 5 French Pinnacle sheath was inserted. Through this, and also over 0.035 inch guidewire, a 5 French Simmons 2 catheter was advanced to the aortic arch region and formed without difficulty. It was selectively positioned in the right common carotid artery, the right vertebral artery, the left common carotid artery and the left subclavian artery. FINDINGS: The right vertebral artery origin is widely patent. The vessel is seen to opacify normally to the cranial skull base. Wide patency is seen of the right vertebrobasilar junction and the right posterior-inferior cerebellar artery. The opacified portion of the basilar artery, the posterior cerebral arteries, the superior cerebellar arteries and the anterior-inferior cerebellar arteries appear normal into the capillary and venous phases. Non-opacified blood is seen in the basilar artery from contralateral vertebral artery. The left common carotid arteriogram demonstrates the left external carotid artery and its major branches to be widely patent. The left internal carotid artery at the bulb to the cranial skull base is widely  patent. There is mild tortuosity in the proximal 1/3 of the left internal carotid artery. The petrous, cavernous and the supraclinoid segments are widely patent. The left middle cerebral artery and the left anterior cerebral artery opacify normally into the capillary and venous phases. The venous phase demonstrates retrograde opacification of the left transverse sinus in its proximal 2/3 primarily from the ipsilateral vein of Labbe. The right common carotid arteriogram demonstrates the right external carotid artery and its major branches to be widely patent. The right internal carotid artery at the bulb to the cranial skull base is widely patent. There is a double U shaped tortuosity of the middle 1/3 of the right internal carotid artery without evidence of kinking. More distally, the right internal carotid artery is seen to opacify normally to the cranial skull base. The distal petrous, and the proximal cavernous segments demonstrate mild fusiform dilatation. Distal to this the supraclinoid right ICA is normally patent. The right middle cerebral artery and the right anterior cerebral artery demonstrate normal opacification into the capillary and venous phases. The right transverse sinus consistently demonstrates focal areas of moderate irregularity with questionable stenosis at its distal aspect. The left vertebral artery origin demonstrates mild stenosis. The vessel is seen to opacify normally to the cranial skull base. Wide patency is seen of the left vertebrobasilar junction and the left posterior-inferior cerebellar artery. The opacified portion of the distal  left vertebrobasilar junction, the basilar artery, the posterior cerebral arteries, the superior cerebellar arteries and the anterior-cerebellar arteries is grossly patent into the delayed arterial phase. IMPRESSION: Angiographically no evidence of intracranial aneurysms, arteriovenous malformations, dural AV fistula, dissections, or of intraluminal  filling defects. Venous outflow demonstrates areas of caliber irregularity involving the right transverse sinus with suspicion of stenosis. However, patient motion precludes fine detail in this region. PLAN: As per referring physician. Electronically Signed   By: Julieanne Cotton M.D.   On: 01/18/2018 13:10   Ir Angio Vertebral Sel Vertebral Uni R Mod Sed  Result Date: 01/19/2018 CLINICAL DATA:  Focal right frontal hemorrhage. 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 EXAM: IR ANGIO VERTEBRAL SEL VERTEBRAL UNI RIGHT MOD SED; IR ULTRASOUND GUIDANCE VASC ACCESS RIGHT; IR ANGIO VERTEBRAL SEL SUBCLAVIAN INNOMINATE UNI LEFT MOD SED; BILATERAL COMMON CAROTID AND INNOMINATE ANGIOGRAPHY COMPARISON:  MRI MRA of the brain of 01/14/2018. MEDICATIONS: Heparin 0 units IV; no antibiotic was administered within 1 hour of the procedure. ANESTHESIA/SEDATION: Versed 0.5 mg IV; Fentanyl 37.5 mcg IV. Moderate Sedation Time:  75 minutes. The patient was continuously monitored during the procedure by the interventional radiology nurse under my direct supervision. CONTRAST:  Isovue 300 approximately 60 mL. FLUOROSCOPY TIME:  Fluoroscopy Time: 8 minutes 42 seconds (822 mGy). COMPLICATIONS: None immediate. TECHNIQUE: Informed written consent was obtained from the patient after a thorough discussion of the procedural risks, benefits and alternatives. All questions were addressed. Maximal Sterile Barrier Technique was utilized including caps, mask, sterile gowns, sterile gloves, sterile drape, hand hygiene and skin antiseptic. A timeout was performed prior to the initiation of the procedure. The right antecubital fossa was prepped and draped in the usual sterile fashion. Thereafter using modified Seldinger technique, transbrachial access into the right brachial artery was obtained without difficulty. Over a 0.035 inch guidewire, a 5 French Pinnacle sheath was inserted. Through this, and also over 0.035 inch  guidewire, a 5 French Simmons 2 catheter was advanced to the aortic arch region and formed without difficulty. It was selectively positioned in the right common carotid artery, the right vertebral artery, the left common carotid artery and the left subclavian artery. FINDINGS: The right vertebral artery origin is widely patent. The vessel is seen to opacify normally to the cranial skull base. Wide patency is seen of the right vertebrobasilar junction and the right posterior-inferior cerebellar artery. The opacified portion of the basilar artery, the posterior cerebral arteries, the superior cerebellar arteries and the anterior-inferior cerebellar arteries appear normal into the capillary and venous phases. Non-opacified blood is seen in the basilar artery from contralateral vertebral artery. The left common carotid arteriogram demonstrates the left external carotid artery and its major branches to be widely patent. The left internal carotid artery at the bulb to the cranial skull base is widely patent. There is mild tortuosity in the proximal 1/3 of the left internal carotid artery. The petrous, cavernous and the supraclinoid segments are widely patent. The left middle cerebral artery and the left anterior cerebral artery opacify normally into the capillary and venous phases. The venous phase demonstrates retrograde opacification of the left transverse sinus in its proximal 2/3 primarily from the ipsilateral vein of Labbe. The right common carotid arteriogram demonstrates the right external carotid artery and its major branches to be widely patent. The right internal carotid artery at the bulb to the cranial skull base is widely patent. There is a double U shaped tortuosity of the middle 1/3 of the  right internal carotid artery without evidence of kinking. More distally, the right internal carotid artery is seen to opacify normally to the cranial skull base. The distal petrous, and the proximal cavernous segments  demonstrate mild fusiform dilatation. Distal to this the supraclinoid right ICA is normally patent. The right middle cerebral artery and the right anterior cerebral artery demonstrate normal opacification into the capillary and venous phases. The right transverse sinus consistently demonstrates focal areas of moderate irregularity with questionable stenosis at its distal aspect. The left vertebral artery origin demonstrates mild stenosis. The vessel is seen to opacify normally to the cranial skull base. Wide patency is seen of the left vertebrobasilar junction and the left posterior-inferior cerebellar artery. The opacified portion of the distal left vertebrobasilar junction, the basilar artery, the posterior cerebral arteries, the superior cerebellar arteries and the anterior-cerebellar arteries is grossly patent into the delayed arterial phase. IMPRESSION: Angiographically no evidence of intracranial aneurysms, arteriovenous malformations, dural AV fistula, dissections, or of intraluminal filling defects. Venous outflow demonstrates areas of caliber irregularity involving the right transverse sinus with suspicion of stenosis. However, patient motion precludes fine detail in this region. PLAN: As per referring physician. Electronically Signed   By: Julieanne Cotton M.D.   On: 01/18/2018 13:10    Lab Data:  CBC: Recent Labs  Lab 01/31/18 0334  WBC 6.5  HGB 9.1*  HCT 31.0*  MCV 89.6  PLT 422*   Basic Metabolic Panel: Recent Labs  Lab 01/31/18 0334  NA 137  K 4.3  CL 101  CO2 27  GLUCOSE 89  BUN 16  CREATININE 0.64  CALCIUM 9.0   GFR: Estimated Creatinine Clearance: 109 mL/min (by C-G formula based on SCr of 0.64 mg/dL). Liver Function Tests: No results for input(s): AST, ALT, ALKPHOS, BILITOT, PROT, ALBUMIN in the last 168 hours. No results for input(s): LIPASE, AMYLASE in the last 168 hours. No results for input(s): AMMONIA in the last 168 hours. Coagulation Profile: No results  for input(s): INR, PROTIME in the last 168 hours. Cardiac Enzymes: No results for input(s): CKTOTAL, CKMB, CKMBINDEX, TROPONINI in the last 168 hours. BNP (last 3 results) No results for input(s): PROBNP in the last 8760 hours. HbA1C: No results for input(s): HGBA1C in the last 72 hours. CBG: No results for input(s): GLUCAP in the last 168 hours. Lipid Profile: No results for input(s): CHOL, HDL, LDLCALC, TRIG, CHOLHDL, LDLDIRECT in the last 72 hours. Thyroid Function Tests: No results for input(s): TSH, T4TOTAL, FREET4, T3FREE, THYROIDAB in the last 72 hours. Anemia Panel: No results for input(s): VITAMINB12, FOLATE, FERRITIN, TIBC, IRON, RETICCTPCT in the last 72 hours. Urine analysis:    Component Value Date/Time   COLORURINE YELLOW 01/12/2018 2050   APPEARANCEUR CLEAR 01/12/2018 2050   LABSPEC 1.014 01/12/2018 2050   PHURINE 5.0 01/12/2018 2050   GLUCOSEU NEGATIVE 01/12/2018 2050   HGBUR NEGATIVE 01/12/2018 2050   BILIRUBINUR NEGATIVE 01/12/2018 2050   KETONESUR NEGATIVE 01/12/2018 2050   PROTEINUR NEGATIVE 01/12/2018 2050   NITRITE NEGATIVE 01/12/2018 2050   LEUKOCYTESUR NEGATIVE 01/12/2018 2050     Ripudeep Rai M.D. [facility] 02/03/2018, 12:47 PM  Pager: [phone] Between 7am to 7pm - call Pager - [phone]  After 7pm go to [email or web address] - password [email or web address]  Call night coverage person covering after 7pm

## 2018-02-04 DIAGNOSIS — M5440 Lumbago with sciatica, unspecified side: Secondary | ICD-10-CM

## 2018-02-04 NOTE — Consult Note (Signed)
WOC Nurse wound consult note Reason for Consult: Sacral wound Wound type: Unstageable Pressure Injury POA: No Measurement: Yellow slough area measures 1.5 cm x 0.8 cm x unknown depth due to slough Wound bed: The area indicated above is 100% yellow slough. Drainage (amount, consistency, odor) At the time of my assessment in room 4E03 the patient had a foam dressing to the sacral area.  There was not any drainage on the foam dressing. Periwound: Darker pink, no induration, no odor. Dressing procedure/placement/frequency: Replicare hydrocolloid to be applied after the patient's bath today.  It can be left in place for 5 days.  Change if it becomes wrinkled or dislodged. This dressing should provide protection and allow for autolytic debridement of the slough. The patient was instructed on the importance of turning, repositioning, and NOT staying in a supine position for extended periods.  I explained to him if he decides to remain in a position too long that over time he will develop pressure injuries that can be quite serious.  He verbalized his understanding of the information I provided. Monitor the wound area(s) for worsening of condition such as: Signs/symptoms of infection,  Increase in size,  Development of or worsening of odor, Development of pain, or increased pain at the affected locations.  Notify the medical team if any of these develop.  Thank you for the consult.  Discussed plan of care with the patient and bedside nurse.  WOC nurse will not follow at this time.  Please re-consult the WOC team if needed.  Helmut MusterSherry Jamilex Bohnsack, RN, MSN, CWOCN, CNS-BC, pager [phone]

## 2018-02-04 NOTE — Progress Notes (Signed)
[facility]                                                                              Patient Demographics  Marcus [name], is a 42 y.o. male, DOB - [birth year], ZOX:[id number]  Admit date - 01/14/2018   Admitting Physician Otho Najjar, MD  Outpatient Primary MD for the patient is [facility]  Outpatient specialists:   LOS - 21  days   Medical records reviewed and are as summarized below:    Chief Complaint  Patient presents with  . 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 The patient had a vegetation on the posterior leaflet of his mitral valve, and was found to have a subtotal occlusion of his aorta at the level of the IMA, as well as splenic and renal infarcts, treated with coumadin and antibiotics. He presented to the [facility] ED 5/15 with complaints of chest pain, back pain and found to be encephalopathic. 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 ID was consulted, patient placed on penicillin G. Blood cultures repeated at admission here remained negative. Coumadin was stopped due to ICH. TTE showed a 1.4 cm mobile vegetation on the posterior leaflet on the MV with severe regurgitation, though he's had no evidence of heart failure. Vascular srugery also consulted for distal aortoiliac occlusion, felt the patient could not undergo repair as there are no non-prosthetic options and this is contraindicated due to infection. Noted that distal pulses with monophasic with intact motor and sensation to both feet suggesting that these were chronic findings. 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 Palliative care was recommended and has followed with the patient, though he seems to have very few social supports.  Plan is to continue IV antibiotics x6 weeks through 6/11, then indefinite oral amoxicillin.    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  ID was consulted  -ID recommended IV penicillin for 6 weeks till 6/11, then indefinite amoxicillin. 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 -Follow BMET in a.m.   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 -Palliative consulted, DNR  Polysubstance abuse, opioid use disorder Continue Suboxone, Dr. Oswaldo Done has seen the patient   Depression Continue Wellbutrin-   Tobacco use Continue nicotine gum  Chronic hepatitis B and C -Outpatient follow-up with ID   MCA CVA/ICH, M2 occlusion -Cerebral angiogram showed no cerebral aneurysm, no further work-up per neurology, follow-up outpatient in 4 weeks with stroke clinic  Thrombocytosis, leukocytosis Reactive  Anemia of chronic disease - transfuse if below 7.5 -H&H is  currently stable, recheck BMET, CBC in a.m.  Moderate malnutrition Continue nutritional supplements  Sacral wound, unstageable -Wound care consulted, recommended frequent turning, repositioning to the patient  Code Status: DNR DVT Prophylaxis:   SCD's Family Communication: Discussed in detail with the patient, all imaging results, lab results explained to the patient    Disposition Plan:  Time Spent in minutes  25 minutes  Procedures:  TTE on 5/16  Consultants:   Cardiology CT VS Vascular surgery ID Palliative care  Antimicrobials:   Penicillin G   Medications  Scheduled Meds: . buprenorphine-naloxone  1 tablet Sublingual Daily  . buPROPion  150 mg Oral Daily  . famotidine  20 mg Oral BID  . feeding supplement (ENSURE ENLIVE)  237 mL Oral TID PC & HS  . polyethylene glycol  17 g Oral Daily  . senna-docusate  1 tablet Oral QHS   Continuous Infusions: . pencillin G potassium IV Stopped (02/04/18 1000)   PRN Meds:.acetaminophen, bisacodyl, nicotine polacrilex, RESOURCE THICKENUP CLEAR   Antibiotics   Anti-infectives (From admission, onward)   Start     Dose/Rate Route Frequency Ordered Stop   01/19/18 1600  penicillin G potassium 4 Million Units in dextrose 5 % 250 mL IVPB     4 Million Units 250 mL/hr over 60 Minutes Intravenous Every 4 hours 01/19/18 1424 02/09/18 2359   01/18/18 2200  vancomycin (VANCOCIN) IVPB 750 mg/150 ml premix  Status:  Discontinued     750 mg 150 mL/hr over 60 Minutes Intravenous Every 8 hours 01/18/18 1431 01/19/18 1424   01/14/18 2000  vancomycin (VANCOCIN) IVPB 1000 mg/200 mL premix  Status:  Discontinued     1,000 mg 200 mL/hr over 60 Minutes Intravenous Every 8 hours 01/14/18 1103 01/18/18 1431   01/14/18 2000  piperacillin-tazobactam (ZOSYN) IVPB 3.375 g  Status:  Discontinued     3.375 g 12.5 mL/hr over 240 Minutes Intravenous Every 8 hours 01/14/18 1103 01/16/18 1211   01/14/18 1100  vancomycin (VANCOCIN) 1,250 mg in sodium  chloride 0.9 % 250 mL IVPB     1,250 mg 166.7 mL/hr over 90 Minutes Intravenous  Once 01/14/18 1036 01/14/18 1631   01/14/18 1045  piperacillin-tazobactam (ZOSYN) IVPB 3.375 g     3.375 g 100 mL/hr over 30 Minutes Intravenous  Once 01/14/18 1036 01/14/18 1228        Subjective:   Marcus [name] was seen and examined today.  No acute complaints, no fevers or chills.Patient denies dizziness, chest pain, shortness of breath, abdominal pain, N/V/D/C,  No acute events overnight.  Objective:   Vitals:   02/03/18 1219 02/03/18 2013 02/04/18 0441 02/04/18 0836  BP: (!) 99/53  (!) 99/59 (!) 91/57  Pulse: 87  89 88  Resp: 18 18 18 18   Temp: 98.5 F (36.9 C) 97.9 F (36.6 C) 98 F (36.7 C) 98.1 F (36.7 C)  TempSrc: Oral Oral Oral Oral  SpO2: 93%  98% 100%  Weight:   63.9 kg (140 lb  14 oz)   Height:        Intake/Output Summary (Last 24 hours) at 02/04/2018 1102 Last data filed at 02/04/2018 0831 Gross per 24 hour  Intake 968 ml  Output 2160 ml  Net -1192 ml     Wt Readings from Last 3 Encounters:  02/04/18 63.9 kg (140 lb 14 oz)  01/14/18 63.5 kg (140 lb)  01/13/18 63.5 kg (140 lb)     Exam   General: Alert and oriented x 3, NAD  Eyes:   HEENT:    Cardiovascular: S1 S2 auscultated, 3/6 systolic murmur, RRR. no pedal edema b/l  Respiratory: Clear to auscultation bilaterally, no wheezing, rales or rhonchi  Gastrointestinal: Soft, nontender, nondistended, + bowel sounds  Ext: no pedal edema bilaterally  Neuro: no new deficits  Musculoskeletal: No digital cyanosis, clubbing  Skin: Sacral wound unstageable,  Psych: Normal affect and demeanor, alert and oriented x3    Data Reviewed:  I have personally reviewed following labs and imaging studies  Micro Results No results found for this or any previous visit (from the past 240 hour(s)).  Radiology Reports Dg Chest 2 View  Result Date: 01/14/2018 CLINICAL DATA:  Left chest pain EXAM: CHEST - 2 VIEW COMPARISON:   01/13/2018 FINDINGS: Heart and mediastinal contours are within normal limits. No focal opacities or effusions. No acute bony abnormality. IMPRESSION: No active cardiopulmonary disease. Electronically Signed   By: Charlett Nose M.D.   On: 01/14/2018 08:29   Dg Chest 2 View  Result Date: 01/13/2018 CLINICAL DATA:  Weakness, hungry, pain.  History of hepatitis-C. EXAM: CHEST - 2 VIEW COMPARISON:  None. FINDINGS: The heart size and mediastinal contours are within normal limits. Both lungs are clear. The visualized skeletal structures are unremarkable. IMPRESSION: Negative. Electronically Signed   By: Awilda Metro M.D.   On: 01/13/2018 19:46   Ct Head Wo Contrast  Result Date: 01/24/2018 CLINICAL DATA:  Continued surveillance infarcts and hemorrhage. EXAM: CT HEAD WITHOUT CONTRAST TECHNIQUE: Contiguous axial images were obtained from the base of the skull through the vertex without intravenous contrast. COMPARISON:  CT head 01/14/2018. MR head 01/14/2018. CT angiogram 01/18/2018. FINDINGS: Brain: Previously identified subacute infarcts are involving into areas of chronic ischemia affecting the BILATERAL RIGHT greater than LEFT subcortical white matter. Tiny focus of hyperattenuation, RIGHT frontal cortex, image 23 appears improved. This measures no more than 2 x 3 mm on today's exam. Vascular: Improved appearance of the previously identified RIGHT M2 occlusion. Some hyperattenuation of vessel wall in the sylvian fissure, see series 3, image 12. Skull: Normal. Negative for fracture or focal lesion. Sinuses/Orbits: No acute finding. Other: None. IMPRESSION: Previously identified subacute white matter infarcts are have evolved into areas of chronic ischemia. Tiny focus of hyperattenuation RIGHT frontal cortex is improved. Improved appearance of the previously identified RIGHT M2 occlusion. Electronically Signed   By: Elsie Stain M.D.   On: 01/24/2018 17:38   Ct Head Wo Contrast  Addendum Date: 01/14/2018     ADDENDUM REPORT: 01/14/2018 10:35 ADDENDUM: Study discussed by telephone with Dr. Shaune Pollack on 01/14/2018 at 1015 hours. 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 THEREFORE, the appearance is highly likely to reflect SEPTIC EMBOLI, with acute embolic related hemorrhage in the right superior frontal lobe. Electronically Signed   By: Odessa Fleming M.D.   On: 01/14/2018 10:35   Result Date: 01/14/2018 CLINICAL DATA:  42 year old male with altered level consciousness, lightheadedness and dizzy onset about 1 hour ago. EXAM: CT  HEAD WITHOUT CONTRAST TECHNIQUE: Contiguous axial images were obtained from the base of the skull through the vertex without intravenous contrast. COMPARISON:  None. FINDINGS: Brain: Cytotoxic edema at the right anterior insula and frontal operculum. No associated hemorrhage or mass effect in those segments. Small amorphous 7 millimeter focus of hyperdensity at the right superior frontal gyrus on series 3, image 25 and sagittal image 26. On coronal images there is adjacent hypodensity (coronal image 34). No regional mass effect. No other cytotoxic edema identified. ASPECTS 7 (abnormal insula, M4, M 5). More chronic appearing left corona radiata white matter hypodensity on the left. No intracranial mass effect or midline shift. No ventriculomegaly. Normal basilar cisterns. Vascular: Abnormal cylindrical hyperdensity at the right MCA proximal M2 segment with Vessel expansion (series 3, image 14 and series 6, image 21). Skull: No acute osseous abnormality identified. Sinuses/Orbits: Visualized paranasal sinuses and mastoids are well pneumatized. Other: Mildly Disconjugate gaze, otherwise negative orbits soft tissues. No acute scalp soft tissue findings, scattered benign appearing scalp dermal nodularity. IMPRESSION: 1. 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 2. Possible trace focus of acute hemorrhage in the right superior frontal gyrus on series 3, image 25. No  other acute intracranial hemorrhage identified. 3. Right MCA territory cytotoxic edema, ASPECTS 7 (although note suspicion of trace acute hemorrhage in #2). 4. Chronic appearing left MCA white matter disease. Electronically Signed: By: Odessa FlemingH  Hall M.D. On: 01/14/2018 10:18   Ct Angio Abdomen W &/or Wo Contrast  Result Date: 01/21/2018 CLINICAL DATA:  Aorta occlusion EXAM: CT ANGIOGRAPHY ABDOMEN TECHNIQUE: Multidetector CT imaging of the abdomen was performed using the standard protocol during bolus administration of intravenous contrast. Multiplanar reconstructed images and MIPs were obtained and reviewed to evaluate the vascular anatomy. CONTRAST:  100mL ISOVUE-370 IOPAMIDOL (ISOVUE-370) INJECTION 76% COMPAR[facility]:  [facility] FINDINGS: VASCULAR Aorta: The aorta is abruptly occluded just below the takeoff of the IMA. The borders of the abdominal aorta are poorly defined at the level of the occlusion. Mild aneurysmal dilatation of the distal aorta measuring up to 3.7 cm is suspected. At the level of the occlusion, there is mixed low-density and isodensity within the thrombus, as well as a lack of atherosclerotic calcification, suggesting subacute rather than acute age. There is also a mild thickening of the wall of the aorta with stranding in the adjacent fat. These findings suggest an inflammatory process. Celiac: Patent and ectatic. There is abrupt occlusion of the splenic artery, 2 cm from the hilum of the spleen. Distal branches reconstitute. The hepatic artery and its branches are grossly patent. Gastroduodenal artery is patent. SMA: Patent and ectatic. Renals: 2 right renal arteries and a single left renal artery are patent. IMA: Patent. Inflow: Bilateral common iliac arteries are occluded. There is some filling of the mid and distal left common iliac artery with contrast flow. The left common iliac artery measures 1.9 cm in maximal caliber. Right common iliac artery  measures 3.6 cm in caliber. The left internal and external iliac arteries reconstitute and are partially imaged. Veins: No evidence of DVT. Review of the MIP images confirms the above findings. NON-VASCULAR Lower chest: No acute abnormality. Hepatobiliary: Unremarkable Pancreas: Unremarkable Spleen: There is a large nonspecific there is a complex cystic lesion in the spleen measuring up to 11.6 cm. Adrenals/Urinary Tract: There is scarring in the lower pole of the left kidney associated with wedge-shaped areas of low density. These findings likely represent small infarcts.  This is also present in the upper pole of the right kidney to a lesser degree. Stomach/Bowel: No obvious mass in the colon. No evidence of small-bowel obstruction. Lymphatic: Small para-aortic lymph nodes are present. Other: No free fluid.  Postoperative changes from appendectomy. Musculoskeletal: No vertebral compression deformity. Bilateral L5 pars defects. No evidence of anterolisthesis. IMPRESSION: VASCULAR There is abrupt occlusion of the aorta below the level of the IMA as described on the prior report. Mixed density within the aorta suggest subacute age. Also, there is thickening and inflammatory change of the wall of the aorta suggesting vasculitis or mycotic aneurysm. There is also associated occlusion of the bilateral common iliac arteries. Focal occlusion of the splenic artery. NON-VASCULAR Complex cystic lesion in the spleen is nonspecific. Small infarcts in scarring in the kidneys bilaterally. Electronically Signed   By: Jolaine Click M.D.   On: 01/21/2018 12:05   Mr Maxine Glenn Head Wo Contrast  Result Date: 01/14/2018 CLINICAL DATA:  In hospital for sepsis, altered mental status. History of hepatitis-C, polysubstance abuse and endocarditis. History of head injury 1 month ago. Follow-up RIGHT MCA stroke. EXAM: MRI HEAD WITHOUT CONTRAST MRA HEAD WITHOUT CONTRAST TECHNIQUE: Multiplanar, multiecho pulse sequences of the brain and surrounding  structures were obtained without intravenous contrast. Angiographic images of the head were obtained using MRA technique without contrast. COMPARISON:  CT HEAD Jan 14, 2018 at 0951 hours. FINDINGS: MRI HEAD FINDINGS Multiple sequences are mildly or moderately motion degraded. INTRACRANIAL CONTENTS: Patchy reduced diffusion RIGHT frontal lobe and RIGHT basal ganglia, LEFT posterior frontal lobe with normalized ADC values and focal T2 shine through. Associated FLAIR T2 hyperintense signal and faint hemosiderin staining. Slightly expanded FLAIR T2 hyperintense RIGHT basal ganglia. Superimposed focal susceptibility artifact bifrontal lobes, RIGHT parietooccipital lobe. Multiple additional scattered micro hemorrhages. Mild parenchymal brain volume loss. No hydrocephalus. Old small RIGHT cerebellar infarcts. VASCULAR: Susceptibility artifact and T1 shortening RIGHT sylvian fissure corresponding to MCA density. SKULL AND UPPER CERVICAL SPINE: No abnormal sellar expansion. No suspicious calvarial bone marrow signal. Craniocervical junction maintained. SINUSES/ORBITS: The mastoid air-cells and included paranasal sinuses are well-aerated.The included ocular globes and orbital contents are non-suspicious. OTHER: None. MRA HEAD FINDINGS ANTERIOR CIRCULATION: Normal flow related enhancement of the included cervical, petrous, cavernous and supraclinoid internal carotid arteries. Patent anterior communicating artery. Mildly attenuated RIGHT MCA flow related enhancement, occluded RIGHT M2 segment with transient aneurysmal reconstitution. No flow limiting stenosis. POSTERIOR CIRCULATION: vertebral artery is dominant. Basilar artery is patent, with normal flow related enhancement of the main branch vessels. Patent posterior cerebral arteries. Robust RIGHT and smaller LEFT posterior communicating arteries present. No large vessel occlusion, flow limiting stenosis,  aneurysm. ANATOMIC VARIANTS: None. Source images and MIP images were  reviewed. IMPRESSION: MRI HEAD: 1. Multifocal subacute bilateral MCA/watershed territory infarcts. 2. Peripheral susceptibility artifact, septic emboli suspected. There may be superimposed shear injury or underlying chronic hypertension. 3. Mild parenchymal brain volume loss for age. 4. Old small RIGHT cerebellar infarcts. MRA HEAD: 1. 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 2. Otherwise negative MRA head. Examination reviewed by Dr. Augusto Gamble with Dr. Wilford Corner, Neurology on Jan 14, 2018 at approximately 1425 hours. Electronically Signed   By: Awilda Metro M.D.   On: 01/14/2018 14:38   Mr Brain Wo Contrast  Result Date: 01/14/2018 CLINICAL DATA:  In hospital for sepsis, altered mental status. History of hepatitis-C, polysubstance abuse and endocarditis. History of head injury 1 month ago. Follow-up RIGHT  MCA stroke. EXAM: MRI HEAD WITHOUT CONTRAST MRA HEAD WITHOUT CONTRAST TECHNIQUE: Multiplanar, multiecho pulse sequences of the brain and surrounding structures were obtained without intravenous contrast. Angiographic images of the head were obtained using MRA technique without contrast. COMPARISON:  CT HEAD Jan 14, 2018 at 0951 hours. FINDINGS: MRI HEAD FINDINGS Multiple sequences are mildly or moderately motion degraded. INTRACRANIAL CONTENTS: Patchy reduced diffusion RIGHT frontal lobe and RIGHT basal ganglia, LEFT posterior frontal lobe with normalized ADC values and focal T2 shine through. Associated FLAIR T2 hyperintense signal and faint hemosiderin staining. Slightly expanded FLAIR T2 hyperintense RIGHT basal ganglia. Superimposed focal susceptibility artifact bifrontal lobes, RIGHT parietooccipital lobe. Multiple additional scattered micro hemorrhages. Mild parenchymal brain volume loss. No hydrocephalus. Old small RIGHT cerebellar infarcts. VASCULAR: Susceptibility artifact and T1 shortening RIGHT sylvian  fissure corresponding to MCA density. SKULL AND UPPER CERVICAL SPINE: No abnormal sellar expansion. No suspicious calvarial bone marrow signal. Craniocervical junction maintained. SINUSES/ORBITS: The mastoid air-cells and included paranasal sinuses are well-aerated.The included ocular globes and orbital contents are non-suspicious. OTHER: None. MRA HEAD FINDINGS ANTERIOR CIRCULATION: Normal flow related enhancement of the included cervical, petrous, cavernous and supraclinoid internal carotid arteries. Patent anterior communicating artery. Mildly attenuated RIGHT MCA flow related enhancement, occluded RIGHT M2 segment with transient aneurysmal reconstitution. No flow limiting stenosis. POSTERIOR CIRCULATION: vertebral artery is dominant. Basilar artery is patent, with normal flow related enhancement of the main branch vessels. Patent posterior cerebral arteries. Robust RIGHT and smaller LEFT posterior communicating arteries present. No large vessel occlusion, flow limiting stenosis,  aneurysm. ANATOMIC VARIANTS: None. Source images and MIP images were reviewed. IMPRESSION: MRI HEAD: 1. Multifocal subacute bilateral MCA/watershed territory infarcts. 2. Peripheral susceptibility artifact, septic emboli suspected. There may be superimposed shear injury or underlying chronic hypertension. 3. Mild parenchymal brain volume loss for age. 4. Old small RIGHT cerebellar infarcts. MRA HEAD: 1. 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 2. Otherwise negative MRA head. Examination reviewed by Dr. Augusto Gamble with Dr. Wilford Corner, Neurology on Jan 14, 2018 at approximately 1425 hours. Electronically Signed   By: Awilda Metro M.D.   On: 01/14/2018 14:38   Dg Chest Port 1 View  Result Date: 01/15/2018 CLINICAL DATA:  Shortness of breath.  Acute mental status change. EXAM: PORTABLE CHEST 1 VIEW COMPARISON:  Jan 14, 2018 FINDINGS: No  pneumothorax. Vascular crowding in the medial right lung base. No suspicious infiltrate. Mild atelectasis in the left base. The cardiomediastinal silhouette is stable. No other changes. IMPRESSION: Mild atelectasis in the left base.  No other significant change. Electronically Signed   By: Gerome Sam III M.D   On: 01/15/2018 07:40   Ir Angio Intra Extracran Sel Com Carotid Innominate Bilat Mod Sed  Result Date: 01/19/2018 CLINICAL DATA:  Focal right frontal hemorrhage. 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 EXAM: IR ANGIO VERTEBRAL SEL VERTEBRAL UNI RIGHT MOD SED; IR ULTRASOUND GUIDANCE VASC ACCESS RIGHT; IR ANGIO VERTEBRAL SEL SUBCLAVIAN INNOMINATE UNI LEFT MOD SED; BILATERAL COMMON CAROTID AND INNOMINATE ANGIOGRAPHY COMPARISON:  MRI MRA of the brain of 01/14/2018. MEDICATIONS: Heparin 0 units IV; no antibiotic was administered within 1 hour of the procedure. ANESTHESIA/SEDATION: Versed 0.5 mg IV; Fentanyl 37.5 mcg IV. Moderate Sedation Time:  75 minutes. The patient was continuously monitored during the procedure by the interventional radiology nurse under my direct supervision. CONTRAST:  Isovue 300 approximately 60 mL. FLUOROSCOPY TIME:  Fluoroscopy Time: 8 minutes 42 seconds (  822 mGy). COMPLICATIONS: None immediate. TECHNIQUE: Informed written consent was obtained from the patient after a thorough discussion of the procedural risks, benefits and alternatives. All questions were addressed. Maximal Sterile Barrier Technique was utilized including caps, mask, sterile gowns, sterile gloves, sterile drape, hand hygiene and skin antiseptic. A timeout was performed prior to the initiation of the procedure. The right antecubital fossa was prepped and draped in the usual sterile fashion. Thereafter using modified Seldinger technique, transbrachial access into the right brachial artery was obtained without difficulty. Over a 0.035 inch guidewire, a 5 French Pinnacle sheath was inserted.  Through this, and also over 0.035 inch guidewire, a 5 French Simmons 2 catheter was advanced to the aortic arch region and formed without difficulty. It was selectively positioned in the right common carotid artery, the right vertebral artery, the left common carotid artery and the left subclavian artery. FINDINGS: The right vertebral artery origin is widely patent. The vessel is seen to opacify normally to the cranial skull base. Wide patency is seen of the right vertebrobasilar junction and the right posterior-inferior cerebellar artery. The opacified portion of the basilar artery, the posterior cerebral arteries, the superior cerebellar arteries and the anterior-inferior cerebellar arteries appear normal into the capillary and venous phases. Non-opacified blood is seen in the basilar artery from contralateral vertebral artery. The left common carotid arteriogram demonstrates the left external carotid artery and its major branches to be widely patent. The left internal carotid artery at the bulb to the cranial skull base is widely patent. There is mild tortuosity in the proximal 1/3 of the left internal carotid artery. The petrous, cavernous and the supraclinoid segments are widely patent. The left middle cerebral artery and the left anterior cerebral artery opacify normally into the capillary and venous phases. The venous phase demonstrates retrograde opacification of the left transverse sinus in its proximal 2/3 primarily from the ipsilateral vein of Labbe. The right common carotid arteriogram demonstrates the right external carotid artery and its major branches to be widely patent. The right internal carotid artery at the bulb to the cranial skull base is widely patent. There is a double U shaped tortuosity of the middle 1/3 of the right internal carotid artery without evidence of kinking. More distally, the right internal carotid artery is seen to opacify normally to the cranial skull base. The distal petrous,  and the proximal cavernous segments demonstrate mild fusiform dilatation. Distal to this the supraclinoid right ICA is normally patent. The right middle cerebral artery and the right anterior cerebral artery demonstrate normal opacification into the capillary and venous phases. The right transverse sinus consistently demonstrates focal areas of moderate irregularity with questionable stenosis at its distal aspect. The left vertebral artery origin demonstrates mild stenosis. The vessel is seen to opacify normally to the cranial skull base. Wide patency is seen of the left vertebrobasilar junction and the left posterior-inferior cerebellar artery. The opacified portion of the distal left vertebrobasilar junction, the basilar artery, the posterior cerebral arteries, the superior cerebellar arteries and the anterior-cerebellar arteries is grossly patent into the delayed arterial phase. IMPRESSION: Angiographically no evidence of intracranial aneurysms, arteriovenous malformations, dural AV fistula, dissections, or of intraluminal filling defects. Venous outflow demonstrates areas of caliber irregularity involving the right transverse sinus with suspicion of stenosis. However, patient motion precludes fine detail in this region. PLAN: As per referring physician. Electronically Signed   By: Julieanne Cotton M.D.   On: 01/18/2018 13:10   Ir Angio Vertebral Sel Subclavian Innominate Uni L  Mod Sed  Result Date: 01/19/2018 CLINICAL DATA:  Focal right frontal hemorrhage. 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 EXAM: IR ANGIO VERTEBRAL SEL VERTEBRAL UNI RIGHT MOD SED; IR ULTRASOUND GUIDANCE VASC ACCESS RIGHT; IR ANGIO VERTEBRAL SEL SUBCLAVIAN INNOMINATE UNI LEFT MOD SED; BILATERAL COMMON CAROTID AND INNOMINATE ANGIOGRAPHY COMPARISON:  MRI MRA of the brain of 01/14/2018. MEDICATIONS: Heparin 0 units IV; no antibiotic was administered within 1 hour of the procedure. ANESTHESIA/SEDATION: Versed 0.5  mg IV; Fentanyl 37.5 mcg IV. Moderate Sedation Time:  75 minutes. The patient was continuously monitored during the procedure by the interventional radiology nurse under my direct supervision. CONTRAST:  Isovue 300 approximately 60 mL. FLUOROSCOPY TIME:  Fluoroscopy Time: 8 minutes 42 seconds (822 mGy). COMPLICATIONS: None immediate. TECHNIQUE: Informed written consent was obtained from the patient after a thorough discussion of the procedural risks, benefits and alternatives. All questions were addressed. Maximal Sterile Barrier Technique was utilized including caps, mask, sterile gowns, sterile gloves, sterile drape, hand hygiene and skin antiseptic. A timeout was performed prior to the initiation of the procedure. The right antecubital fossa was prepped and draped in the usual sterile fashion. Thereafter using modified Seldinger technique, transbrachial access into the right brachial artery was obtained without difficulty. Over a 0.035 inch guidewire, a 5 French Pinnacle sheath was inserted. Through this, and also over 0.035 inch guidewire, a 5 French Simmons 2 catheter was advanced to the aortic arch region and formed without difficulty. It was selectively positioned in the right common carotid artery, the right vertebral artery, the left common carotid artery and the left subclavian artery. FINDINGS: The right vertebral artery origin is widely patent. The vessel is seen to opacify normally to the cranial skull base. Wide patency is seen of the right vertebrobasilar junction and the right posterior-inferior cerebellar artery. The opacified portion of the basilar artery, the posterior cerebral arteries, the superior cerebellar arteries and the anterior-inferior cerebellar arteries appear normal into the capillary and venous phases. Non-opacified blood is seen in the basilar artery from contralateral vertebral artery. The left common carotid arteriogram demonstrates the left external carotid artery and its major  branches to be widely patent. The left internal carotid artery at the bulb to the cranial skull base is widely patent. There is mild tortuosity in the proximal 1/3 of the left internal carotid artery. The petrous, cavernous and the supraclinoid segments are widely patent. The left middle cerebral artery and the left anterior cerebral artery opacify normally into the capillary and venous phases. The venous phase demonstrates retrograde opacification of the left transverse sinus in its proximal 2/3 primarily from the ipsilateral vein of Labbe. The right common carotid arteriogram demonstrates the right external carotid artery and its major branches to be widely patent. The right internal carotid artery at the bulb to the cranial skull base is widely patent. There is a double U shaped tortuosity of the middle 1/3 of the right internal carotid artery without evidence of kinking. More distally, the right internal carotid artery is seen to opacify normally to the cranial skull base. The distal petrous, and the proximal cavernous segments demonstrate mild fusiform dilatation. Distal to this the supraclinoid right ICA is normally patent. The right middle cerebral artery and the right anterior cerebral artery demonstrate normal opacification into the capillary and venous phases. The right transverse sinus consistently demonstrates focal areas of moderate irregularity with questionable stenosis at its distal aspect. The left vertebral artery origin demonstrates mild stenosis. The vessel is seen  to opacify normally to the cranial skull base. Wide patency is seen of the left vertebrobasilar junction and the left posterior-inferior cerebellar artery. The opacified portion of the distal left vertebrobasilar junction, the basilar artery, the posterior cerebral arteries, the superior cerebellar arteries and the anterior-cerebellar arteries is grossly patent into the delayed arterial phase. IMPRESSION: Angiographically no evidence  of intracranial aneurysms, arteriovenous malformations, dural AV fistula, dissections, or of intraluminal filling defects. Venous outflow demonstrates areas of caliber irregularity involving the right transverse sinus with suspicion of stenosis. However, patient motion precludes fine detail in this region. PLAN: As per referring physician. Electronically Signed   By: Julieanne Cotton M.D.   On: 01/18/2018 13:10   Ir Angio Vertebral Sel Vertebral Uni R Mod Sed  Result Date: 01/19/2018 CLINICAL DATA:  Focal right frontal hemorrhage. 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 EXAM: IR ANGIO VERTEBRAL SEL VERTEBRAL UNI RIGHT MOD SED; IR ULTRASOUND GUIDANCE VASC ACCESS RIGHT; IR ANGIO VERTEBRAL SEL SUBCLAVIAN INNOMINATE UNI LEFT MOD SED; BILATERAL COMMON CAROTID AND INNOMINATE ANGIOGRAPHY COMPARISON:  MRI MRA of the brain of 01/14/2018. MEDICATIONS: Heparin 0 units IV; no antibiotic was administered within 1 hour of the procedure. ANESTHESIA/SEDATION: Versed 0.5 mg IV; Fentanyl 37.5 mcg IV. Moderate Sedation Time:  75 minutes. The patient was continuously monitored during the procedure by the interventional radiology nurse under my direct supervision. CONTRAST:  Isovue 300 approximately 60 mL. FLUOROSCOPY TIME:  Fluoroscopy Time: 8 minutes 42 seconds (822 mGy). COMPLICATIONS: None immediate. TECHNIQUE: Informed written consent was obtained from the patient after a thorough discussion of the procedural risks, benefits and alternatives. All questions were addressed. Maximal Sterile Barrier Technique was utilized including caps, mask, sterile gowns, sterile gloves, sterile drape, hand hygiene and skin antiseptic. A timeout was performed prior to the initiation of the procedure. The right antecubital fossa was prepped and draped in the usual sterile fashion. Thereafter using modified Seldinger technique, transbrachial access into the right brachial artery was obtained without difficulty. Over a  0.035 inch guidewire, a 5 French Pinnacle sheath was inserted. Through this, and also over 0.035 inch guidewire, a 5 French Simmons 2 catheter was advanced to the aortic arch region and formed without difficulty. It was selectively positioned in the right common carotid artery, the right vertebral artery, the left common carotid artery and the left subclavian artery. FINDINGS: The right vertebral artery origin is widely patent. The vessel is seen to opacify normally to the cranial skull base. Wide patency is seen of the right vertebrobasilar junction and the right posterior-inferior cerebellar artery. The opacified portion of the basilar artery, the posterior cerebral arteries, the superior cerebellar arteries and the anterior-inferior cerebellar arteries appear normal into the capillary and venous phases. Non-opacified blood is seen in the basilar artery from contralateral vertebral artery. The left common carotid arteriogram demonstrates the left external carotid artery and its major branches to be widely patent. The left internal carotid artery at the bulb to the cranial skull base is widely patent. There is mild tortuosity in the proximal 1/3 of the left internal carotid artery. The petrous, cavernous and the supraclinoid segments are widely patent. The left middle cerebral artery and the left anterior cerebral artery opacify normally into the capillary and venous phases. The venous phase demonstrates retrograde opacification of the left transverse sinus in its proximal 2/3 primarily from the ipsilateral vein of Labbe. The right common carotid arteriogram demonstrates the right external carotid artery and its major branches to be widely patent.  The right internal carotid artery at the bulb to the cranial skull base is widely patent. There is a double U shaped tortuosity of the middle 1/3 of the right internal carotid artery without evidence of kinking. More distally, the right internal carotid artery is seen to  opacify normally to the cranial skull base. The distal petrous, and the proximal cavernous segments demonstrate mild fusiform dilatation. Distal to this the supraclinoid right ICA is normally patent. The right middle cerebral artery and the right anterior cerebral artery demonstrate normal opacification into the capillary and venous phases. The right transverse sinus consistently demonstrates focal areas of moderate irregularity with questionable stenosis at its distal aspect. The left vertebral artery origin demonstrates mild stenosis. The vessel is seen to opacify normally to the cranial skull base. Wide patency is seen of the left vertebrobasilar junction and the left posterior-inferior cerebellar artery. The opacified portion of the distal left vertebrobasilar junction, the basilar artery, the posterior cerebral arteries, the superior cerebellar arteries and the anterior-cerebellar arteries is grossly patent into the delayed arterial phase. IMPRESSION: Angiographically no evidence of intracranial aneurysms, arteriovenous malformations, dural AV fistula, dissections, or of intraluminal filling defects. Venous outflow demonstrates areas of caliber irregularity involving the right transverse sinus with suspicion of stenosis. However, patient motion precludes fine detail in this region. PLAN: As per referring physician. Electronically Signed   By: Julieanne Cotton M.D.   On: 01/18/2018 13:10    Lab Data:  CBC: Recent Labs  Lab 01/31/18 0334  WBC 6.5  HGB 9.1*  HCT 31.0*  MCV 89.6  PLT 422*   Basic Metabolic Panel: Recent Labs  Lab 01/31/18 0334  NA 137  K 4.3  CL 101  CO2 27  GLUCOSE 89  BUN 16  CREATININE 0.64  CALCIUM 9.0   GFR: Estimated Creatinine Clearance: 110.9 mL/min (by C-G formula based on SCr of 0.64 mg/dL). Liver Function Tests: No results for input(s): AST, ALT, ALKPHOS, BILITOT, PROT, ALBUMIN in the last 168 hours. No results for input(s): LIPASE, AMYLASE in the last  168 hours. No results for input(s): AMMONIA in the last 168 hours. Coagulation Profile: No results for input(s): INR, PROTIME in the last 168 hours. Cardiac Enzymes: No results for input(s): CKTOTAL, CKMB, CKMBINDEX, TROPONINI in the last 168 hours. BNP (last 3 results) No results for input(s): PROBNP in the last 8760 hours. HbA1C: No results for input(s): HGBA1C in the last 72 hours. CBG: No results for input(s): GLUCAP in the last 168 hours. Lipid Profile: No results for input(s): CHOL, HDL, LDLCALC, TRIG, CHOLHDL, LDLDIRECT in the last 72 hours. Thyroid Function Tests: No results for input(s): TSH, T4TOTAL, FREET4, T3FREE, THYROIDAB in the last 72 hours. Anemia Panel: No results for input(s): VITAMINB12, FOLATE, FERRITIN, TIBC, IRON, RETICCTPCT in the last 72 hours. Urine analysis:    Component Value Date/Time   COLORURINE YELLOW 01/12/2018 2050   APPEARANCEUR CLEAR 01/12/2018 2050   LABSPEC 1.014 01/12/2018 2050   PHURINE 5.0 01/12/2018 2050   GLUCOSEU NEGATIVE 01/12/2018 2050   HGBUR NEGATIVE 01/12/2018 2050   BILIRUBINUR NEGATIVE 01/12/2018 2050   KETONESUR NEGATIVE 01/12/2018 2050   PROTEINUR NEGATIVE 01/12/2018 2050   NITRITE NEGATIVE 01/12/2018 2050   LEUKOCYTESUR NEGATIVE 01/12/2018 2050     Ripudeep Rai M.D. [facility] 02/04/2018, 11:02 AM  Pager: [phone] Between 7am to 7pm - call Pager - [phone]  After 7pm go to [email or web address] - password [email or web address]  Call night coverage person covering after 7pm

## 2018-02-05 LAB — CBC
HCT: 33.9 % — ABNORMAL LOW (ref 39.0–52.0)
Hemoglobin: 10.1 g/dL — ABNORMAL LOW (ref 13.0–17.0)
MCH: 26.9 pg (ref 26.0–34.0)
MCHC: 29.8 g/dL — AB (ref 30.0–36.0)
MCV: 90.4 fL (ref 78.0–100.0)
PLATELETS: 338 10*3/uL (ref 150–400)
RBC: 3.75 MIL/uL — AB (ref 4.22–5.81)
RDW: 17.7 % — ABNORMAL HIGH (ref 11.5–15.5)
WBC: 5.6 10*3/uL (ref 4.0–10.5)

## 2018-02-05 LAB — BASIC METABOLIC PANEL
Anion gap: 5 (ref 5–15)
BUN: 10 mg/dL (ref 6–20)
CALCIUM: 9 mg/dL (ref 8.9–10.3)
CO2: 28 mmol/L (ref 22–32)
CREATININE: 0.7 mg/dL (ref 0.61–1.24)
Chloride: 104 mmol/L (ref 101–111)
GFR calc Af Amer: >60 mL/min (ref >60)
Glucose, Bld: 111 mg/dL — ABNORMAL HIGH (ref 65–99)
Potassium: 3.8 mmol/L (ref 3.5–5.1)
Sodium: 137 mmol/L (ref 135–145)

## 2018-02-05 NOTE — Progress Notes (Signed)
[facility]                                                                              Patient Demographics  Marcus [name], is a 42 y.o. male, DOB - [birth year], WUJ:[id number]  Admit date - 01/14/2018   Admitting Physician Otho Najjar, MD  Outpatient Primary MD for the patient is [facility]  Outpatient specialists:   LOS - 22  days   Medical records reviewed and are as summarized below:    Chief Complaint  Patient presents with  . 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 The patient had a vegetation on the posterior leaflet of his mitral valve, and was found to have a subtotal occlusion of his aorta at the level of the IMA, as well as splenic and renal infarcts, treated with coumadin and antibiotics. He presented to the [facility] ED 5/15 with complaints of chest pain, back pain and found to be encephalopathic. 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 ID was consulted, patient placed on penicillin G. Blood cultures repeated at admission here remained negative. Coumadin was stopped due to ICH. TTE showed a 1.4 cm mobile vegetation on the posterior leaflet on the MV with severe regurgitation, though he's had no evidence of heart failure. Vascular srugery also consulted for distal aortoiliac occlusion, felt the patient could not undergo repair as there are no non-prosthetic options and this is contraindicated due to infection. Noted that distal pulses with monophasic with intact motor and sensation to both feet suggesting that these were chronic findings. 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 Palliative care was recommended and has followed with the patient, though he seems to have very few social supports.  Plan is to continue IV antibiotics x6 weeks through 6/11, then indefinite oral amoxicillin.    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  ID was consulted  -ID recommended IV penicillin for 6 weeks till 6/11, then indefinite amoxicillin. 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 -Palliative consulted, DNR  Polysubstance abuse, opioid use disorder Continue Suboxone, Dr. Oswaldo Done has seen the patient   Depression Continue Wellbutrin  Tobacco use Continue nicotine gum  Chronic hepatitis B and C -Outpatient follow-up with ID   MCA CVA/ICH, M2 occlusion -Cerebral angiogram showed no cerebral aneurysm, no further work-up per neurology, follow-up outpatient in 4 weeks with stroke clinic  Thrombocytosis, leukocytosis Improving, within normal  limits  Anemia of chronic disease - transfuse if below 7.5 -H&H is currently stable, recheck BMET, CBC in a.m.  Moderate malnutrition Continue nutritional supplements  Sacral wound, unstageable -Wound care consulted, recommended frequent turning, repositioning to the patient  Code Status: DNR DVT Prophylaxis:   SCD's Family Communication: Discussed in detail with the patient, all imaging results, lab results explained to the patient    Disposition Plan:  Time Spent in minutes  15 minutes  Procedures:  TTE on 5/16  Consultants:   Cardiology CT VS Vascular surgery ID Palliative care  Antimicrobials:   Penicillin G   Medications  Scheduled Meds: . buprenorphine-naloxone  1 tablet Sublingual Daily  . buPROPion  150 mg Oral Daily  . famotidine  20 mg Oral BID  . feeding supplement (ENSURE ENLIVE)  237 mL Oral TID PC & HS  . polyethylene glycol  17 g Oral Daily  . senna-docusate  1 tablet Oral QHS   Continuous Infusions: . pencillin G potassium IV 4 Million Units (02/05/18 1214)   PRN Meds:.acetaminophen, bisacodyl, nicotine polacrilex, RESOURCE THICKENUP CLEAR   Antibiotics   Anti-infectives (From admission, onward)   Start     Dose/Rate Route Frequency Ordered Stop   01/19/18 1600  penicillin G potassium 4 Million Units in dextrose 5 % 250 mL IVPB     4 Million Units 250 mL/hr over 60 Minutes Intravenous Every 4 hours 01/19/18 1424 02/09/18 2359   01/18/18 2200  vancomycin (VANCOCIN) IVPB 750 mg/150 ml premix  Status:  Discontinued     750 mg 150 mL/hr over 60 Minutes Intravenous Every 8 hours 01/18/18 1431 01/19/18 1424   01/14/18 2000  vancomycin (VANCOCIN) IVPB 1000 mg/200 mL premix  Status:  Discontinued     1,000 mg 200 mL/hr over 60 Minutes Intravenous Every 8 hours 01/14/18 1103 01/18/18 1431   01/14/18 2000  piperacillin-tazobactam (ZOSYN) IVPB 3.375 g  Status:  Discontinued     3.375 g 12.5 mL/hr over 240 Minutes Intravenous Every 8 hours  01/14/18 1103 01/16/18 1211   01/14/18 1100  vancomycin (VANCOCIN) 1,250 mg in sodium chloride 0.9 % 250 mL IVPB     1,250 mg 166.7 mL/hr over 90 Minutes Intravenous  Once 01/14/18 1036 01/14/18 1631   01/14/18 1045  piperacillin-tazobactam (ZOSYN) IVPB 3.375 g     3.375 g 100 mL/hr over 30 Minutes Intravenous  Once 01/14/18 1036 01/14/18 1228        Subjective:   Marcus [name] was seen and examined today.  No complaints, worried about disposition.  No fevers or chills.  Patient denies dizziness, chest pain, shortness of breath, abdominal pain, N/V/D/C,  No acute events overnight.  Objective:   Vitals:   02/04/18 2303 02/05/18 0445 02/05/18 0840 02/05/18 1500  BP: (!) 93/57 (!) 102/58 (!) 128/103 (!) 153/124  Pulse: (!) 106 (!) 105 (!) 52 100  Resp: 17 19 18 19   Temp: 98 F (36.7 C) 97.9 F (36.6 C) 98.2 F (36.8 C) 98.2 F (36.8 C)  TempSrc:  Oral Oral Oral Oral  SpO2: 96% 98% 100% 97%  Weight:  61.1 kg (134 lb 9.6 oz)    Height:        Intake/Output Summary (Last 24 hours) at 02/05/2018 1527 Last data filed at 02/05/2018 1500 Gross per 24 hour  Intake 1850 ml  Output 1770 ml  Net 80 ml     Wt Readings from Last 3 Encounters:  02/05/18 61.1 kg (134 lb 9.6 oz)  01/14/18 63.5 kg (140 lb)  01/13/18 63.5 kg (140 lb)     Exam  Physical Exam  General: Alert and oriented x 3, NAD  Eyes:   HEENT:   Cardiovascular: S1 S2 auscultated, 3/6 systolic murmur.  Regular rate and rhythm. No pedal edema b/l  Respiratory: Clear to auscultation bilaterally, no wheezing, rales or rhonchi  Gastrointestinal: Soft, nontender, nondistended, + bowel sounds  Ext: no pedal edema bilaterally  Neuro: no new deficit  Musculoskeletal: No digital cyanosis, clubbing  Skin: No rashes  Psych: Normal affect and demeanor, alert and oriented x3   Data Reviewed:  I have personally reviewed following labs and imaging studies  Micro Results No results found for this or any previous  visit (from the past 240 hour(s)).  Radiology Reports Dg Chest 2 View  Result Date: 01/14/2018 CLINICAL DATA:  Left chest pain EXAM: CHEST - 2 VIEW COMPARISON:  01/13/2018 FINDINGS: Heart and mediastinal contours are within normal limits. No focal opacities or effusions. No acute bony abnormality. IMPRESSION: No active cardiopulmonary disease. Electronically Signed   By: Charlett Nose M.D.   On: 01/14/2018 08:29   Dg Chest 2 View  Result Date: 01/13/2018 CLINICAL DATA:  Weakness, hungry, pain.  History of hepatitis-C. EXAM: CHEST - 2 VIEW COMPARISON:  None. FINDINGS: The heart size and mediastinal contours are within normal limits. Both lungs are clear. The visualized skeletal structures are unremarkable. IMPRESSION: Negative. Electronically Signed   By: Awilda Metro M.D.   On: 01/13/2018 19:46   Ct Head Wo Contrast  Result Date: 01/24/2018 CLINICAL DATA:  Continued surveillance infarcts and hemorrhage. EXAM: CT HEAD WITHOUT CONTRAST TECHNIQUE: Contiguous axial images were obtained from the base of the skull through the vertex without intravenous contrast. COMPARISON:  CT head 01/14/2018. MR head 01/14/2018. CT angiogram 01/18/2018. FINDINGS: Brain: Previously identified subacute infarcts are involving into areas of chronic ischemia affecting the BILATERAL RIGHT greater than LEFT subcortical white matter. Tiny focus of hyperattenuation, RIGHT frontal cortex, image 23 appears improved. This measures no more than 2 x 3 mm on today's exam. Vascular: Improved appearance of the previously identified RIGHT M2 occlusion. Some hyperattenuation of vessel wall in the sylvian fissure, see series 3, image 12. Skull: Normal. Negative for fracture or focal lesion. Sinuses/Orbits: No acute finding. Other: None. IMPRESSION: Previously identified subacute white matter infarcts are have evolved into areas of chronic ischemia. Tiny focus of hyperattenuation RIGHT frontal cortex is improved. Improved appearance of the  previously identified RIGHT M2 occlusion. Electronically Signed   By: Elsie Stain M.D.   On: 01/24/2018 17:38   Ct Head Wo Contrast  Addendum Date: 01/14/2018   ADDENDUM REPORT: 01/14/2018 10:35 ADDENDUM: Study discussed by telephone with Dr. Shaune Pollack on 01/14/2018 at 1015 hours. 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 THEREFORE, the appearance is highly likely to reflect SEPTIC EMBOLI, with acute embolic related hemorrhage in the right superior frontal lobe. Electronically Signed   By: Odessa Fleming M.D.   On: 01/14/2018 10:35   Result Date:  01/14/2018 CLINICAL DATA:  41 year old male with altered level consciousness, lightheadedness and dizzy onset about 1 hour ago. EXAM: CT HEAD WITHOUT CONTRAST TECHNIQUE: Contiguous axial images were obtained from the base of the skull through the vertex without intravenous contrast. COMPARISON:  None. FINDINGS: Brain: Cytotoxic edema at the right anterior insula and frontal operculum. No associated hemorrhage or mass effect in those segments. Small amorphous 7 millimeter focus of hyperdensity at the right superior frontal gyrus on series 3, image 25 and sagittal image 26. On coronal images there is adjacent hypodensity (coronal image 34). No regional mass effect. No other cytotoxic edema identified. ASPECTS 7 (abnormal insula, M4, M 5). More chronic appearing left corona radiata white matter hypodensity on the left. No intracranial mass effect or midline shift. No ventriculomegaly. Normal basilar cisterns. Vascular: Abnormal cylindrical hyperdensity at the right MCA proximal M2 segment with Vessel expansion (series 3, image 14 and series 6, image 21). Skull: No acute osseous abnormality identified. Sinuses/Orbits: Visualized paranasal sinuses and mastoids are well pneumatized. Other: Mildly Disconjugate gaze, otherwise negative orbits soft tissues. No acute scalp soft tissue findings, scattered benign appearing scalp dermal nodularity. IMPRESSION: 1.  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 2. Possible trace focus of acute hemorrhage in the right superior frontal gyrus on series 3, image 25. No other acute intracranial hemorrhage identified. 3. Right MCA territory cytotoxic edema, ASPECTS 7 (although note suspicion of trace acute hemorrhage in #2). 4. Chronic appearing left MCA white matter disease. Electronically Signed: By: Odessa Fleming M.D. On: 01/14/2018 10:18   Ct Angio Abdomen W &/or Wo Contrast  Result Date: 01/21/2018 CLINICAL DATA:  Aorta occlusion EXAM: CT ANGIOGRAPHY ABDOMEN TECHNIQUE: Multidetector CT imaging of the abdomen was performed using the standard protocol during bolus administration of intravenous contrast. Multiplanar reconstructed images and MIPs were obtained and reviewed to evaluate the vascular anatomy. CONTRAST:  ISOVUE-370 IOPAMIDOL (ISOVUE-370) INJECTION 76% COMPARISON:  12/29/2017 at [facility], report only FINDINGS: VASCULAR Aorta: The aorta is abruptly occluded just below the takeoff of the IMA. The borders of the abdominal aorta are poorly defined at the level of the occlusion. Mild aneurysmal dilatation of the distal aorta measuring up to 3.7 cm is suspected. At the level of the occlusion, there is mixed low-density and isodensity within the thrombus, as well as a lack of atherosclerotic calcification, suggesting subacute rather than acute age. There is also a mild thickening of the wall of the aorta with stranding in the adjacent fat. These findings suggest an inflammatory process. Celiac: Patent and ectatic. There is abrupt occlusion of the splenic artery, 2 cm from the hilum of the spleen. Distal branches reconstitute. The hepatic artery and its branches are grossly patent. Gastroduodenal artery is patent. SMA: Patent and ectatic. Renals: 2 right renal arteries and a single left renal artery are patent. IMA: Patent. Inflow: Bilateral common iliac arteries are occluded. There is  some filling of the mid and distal left common iliac artery with contrast flow. The left common iliac artery measures 1.9 cm in maximal caliber. Right common iliac artery measures 3.6 cm in caliber. The left internal and external iliac arteries reconstitute and are partially imaged. Veins: No evidence of DVT. Review of the MIP images confirms the above findings. NON-VASCULAR Lower chest: No acute abnormality. Hepatobiliary: Unremarkable Pancreas: Unremarkable Spleen: There is a large nonspecific there is a complex cystic lesion in the spleen measuring up to 11.6 cm. Adrenals/Urinary Tract: There is scarring in  the lower pole of the left kidney associated with wedge-shaped areas of low density. These findings likely represent small infarcts. This is also present in the upper pole of the right kidney to a lesser degree. Stomach/Bowel: No obvious mass in the colon. No evidence of small-bowel obstruction. Lymphatic: Small para-aortic lymph nodes are present. Other: No free fluid.  Postoperative changes from appendectomy. Musculoskeletal: No vertebral compression deformity. Bilateral L5 pars defects. No evidence of anterolisthesis. IMPRESSION: VASCULAR There is abrupt occlusion of the aorta below the level of the IMA as described on the prior report. Mixed density within the aorta suggest subacute age. Also, there is thickening and inflammatory change of the wall of the aorta suggesting vasculitis or mycotic aneurysm. There is also associated occlusion of the bilateral common iliac arteries. Focal occlusion of the splenic artery. NON-VASCULAR Complex cystic lesion in the spleen is nonspecific. Small infarcts in scarring in the kidneys bilaterally. Electronically Signed   By: Jolaine Click M.D.   On: 01/21/2018 12:05   Mr Maxine Glenn Head Wo Contrast  Result Date: 01/14/2018 CLINICAL DATA:  In hospital for sepsis, altered mental status. History of hepatitis-C, polysubstance abuse and endocarditis. History of head injury 1  month ago. Follow-up RIGHT MCA stroke. EXAM: MRI HEAD WITHOUT CONTRAST MRA HEAD WITHOUT CONTRAST TECHNIQUE: Multiplanar, multiecho pulse sequences of the brain and surrounding structures were obtained without intravenous contrast. Angiographic images of the head were obtained using MRA technique without contrast. COMPARISON:  CT HEAD Jan 14, 2018 at 0951 hours. FINDINGS: MRI HEAD FINDINGS Multiple sequences are mildly or moderately motion degraded. INTRACRANIAL CONTENTS: Patchy reduced diffusion RIGHT frontal lobe and RIGHT basal ganglia, LEFT posterior frontal lobe with normalized ADC values and focal T2 shine through. Associated FLAIR T2 hyperintense signal and faint hemosiderin staining. Slightly expanded FLAIR T2 hyperintense RIGHT basal ganglia. Superimposed focal susceptibility artifact bifrontal lobes, RIGHT parietooccipital lobe. Multiple additional scattered micro hemorrhages. Mild parenchymal brain volume loss. No hydrocephalus. Old small RIGHT cerebellar infarcts. VASCULAR: Susceptibility artifact and T1 shortening RIGHT sylvian fissure corresponding to MCA density. SKULL AND UPPER CERVICAL SPINE: No abnormal sellar expansion. No suspicious calvarial bone marrow signal. Craniocervical junction maintained. SINUSES/ORBITS: The mastoid air-cells and included paranasal sinuses are well-aerated.The included ocular globes and orbital contents are non-suspicious. OTHER: None. MRA HEAD FINDINGS ANTERIOR CIRCULATION: Normal flow related enhancement of the included cervical, petrous, cavernous and supraclinoid internal carotid arteries. Patent anterior communicating artery. Mildly attenuated RIGHT MCA flow related enhancement, occluded RIGHT M2 segment with transient aneurysmal reconstitution. No flow limiting stenosis. POSTERIOR CIRCULATION: vertebral artery is dominant. Basilar artery is patent, with normal flow related enhancement of the main branch vessels. Patent posterior cerebral arteries. Robust RIGHT and  smaller LEFT posterior communicating arteries present. No large vessel occlusion, flow limiting stenosis,  aneurysm. ANATOMIC VARIANTS: None. Source images and MIP images were reviewed. IMPRESSION: MRI HEAD: 1. Multifocal subacute bilateral MCA/watershed territory infarcts. 2. Peripheral susceptibility artifact, septic emboli suspected. There may be superimposed shear injury or underlying chronic hypertension. 3. Mild parenchymal brain volume loss for age. 4. Old small RIGHT cerebellar infarcts. MRA HEAD: 1. 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 2. Otherwise negative MRA head. Examination reviewed by Dr. Augusto Gamble with Dr. Wilford Corner, Neurology on Jan 14, 2018 at approximately 1425 hours. Electronically Signed   By: Awilda Metro M.D.   On: 01/14/2018 14:38   Mr Brain Wo Contrast  Result Date: 01/14/2018 CLINICAL DATA:  In hospital for  sepsis, altered mental status. History of hepatitis-C, polysubstance abuse and endocarditis. History of head injury 1 month ago. Follow-up RIGHT MCA stroke. EXAM: MRI HEAD WITHOUT CONTRAST MRA HEAD WITHOUT CONTRAST TECHNIQUE: Multiplanar, multiecho pulse sequences of the brain and surrounding structures were obtained without intravenous contrast. Angiographic images of the head were obtained using MRA technique without contrast. COMPARISON:  CT HEAD Jan 14, 2018 at 0951 hours. FINDINGS: MRI HEAD FINDINGS Multiple sequences are mildly or moderately motion degraded. INTRACRANIAL CONTENTS: Patchy reduced diffusion RIGHT frontal lobe and RIGHT basal ganglia, LEFT posterior frontal lobe with normalized ADC values and focal T2 shine through. Associated FLAIR T2 hyperintense signal and faint hemosiderin staining. Slightly expanded FLAIR T2 hyperintense RIGHT basal ganglia. Superimposed focal susceptibility artifact bifrontal lobes, RIGHT parietooccipital lobe. Multiple additional scattered  micro hemorrhages. Mild parenchymal brain volume loss. No hydrocephalus. Old small RIGHT cerebellar infarcts. VASCULAR: Susceptibility artifact and T1 shortening RIGHT sylvian fissure corresponding to MCA density. SKULL AND UPPER CERVICAL SPINE: No abnormal sellar expansion. No suspicious calvarial bone marrow signal. Craniocervical junction maintained. SINUSES/ORBITS: The mastoid air-cells and included paranasal sinuses are well-aerated.The included ocular globes and orbital contents are non-suspicious. OTHER: None. MRA HEAD FINDINGS ANTERIOR CIRCULATION: Normal flow related enhancement of the included cervical, petrous, cavernous and supraclinoid internal carotid arteries. Patent anterior communicating artery. Mildly attenuated RIGHT MCA flow related enhancement, occluded RIGHT M2 segment with transient aneurysmal reconstitution. No flow limiting stenosis. POSTERIOR CIRCULATION: vertebral artery is dominant. Basilar artery is patent, with normal flow related enhancement of the main branch vessels. Patent posterior cerebral arteries. Robust RIGHT and smaller LEFT posterior communicating arteries present. No large vessel occlusion, flow limiting stenosis,  aneurysm. ANATOMIC VARIANTS: None. Source images and MIP images were reviewed. IMPRESSION: MRI HEAD: 1. Multifocal subacute bilateral MCA/watershed territory infarcts. 2. Peripheral susceptibility artifact, septic emboli suspected. There may be superimposed shear injury or underlying chronic hypertension. 3. Mild parenchymal brain volume loss for age. 4. Old small RIGHT cerebellar infarcts. MRA HEAD: 1. 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 2. Otherwise negative MRA head. Examination reviewed by Dr. Augusto Gamble with Dr. Wilford Corner, Neurology on Jan 14, 2018 at approximately 1425 hours. Electronically Signed   By: Awilda Metro M.D.   On: 01/14/2018 14:38   Dg Chest  Port 1 View  Result Date: 01/15/2018 CLINICAL DATA:  Shortness of breath.  Acute mental status change. EXAM: PORTABLE CHEST 1 VIEW COMPARISON:  Jan 14, 2018 FINDINGS: No pneumothorax. Vascular crowding in the medial right lung base. No suspicious infiltrate. Mild atelectasis in the left base. The cardiomediastinal silhouette is stable. No other changes. IMPRESSION: Mild atelectasis in the left base.  No other significant change. Electronically Signed   By: Gerome Sam III M.D   On: 01/15/2018 07:40   Ir Angio Intra Extracran Sel Com Carotid Innominate Bilat Mod Sed  Result Date: 01/19/2018 CLINICAL DATA:  Focal right frontal hemorrhage. 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 EXAM: IR ANGIO VERTEBRAL SEL VERTEBRAL UNI RIGHT MOD SED; IR ULTRASOUND GUIDANCE VASC ACCESS RIGHT; IR ANGIO VERTEBRAL SEL SUBCLAVIAN INNOMINATE UNI LEFT MOD SED; BILATERAL COMMON CAROTID AND INNOMINATE ANGIOGRAPHY COMPARISON:  MRI MRA of the brain of 01/14/2018. MEDICATIONS: Heparin 0 units IV; no antibiotic was administered within 1 hour of the procedure. ANESTHESIA/SEDATION: Versed 0.5 mg IV; Fentanyl 37.5 mcg IV. Moderate Sedation Time:  75 minutes. The patient was continuously monitored during the procedure by the interventional radiology nurse  under my direct supervision. CONTRAST:  Isovue 300 approximately 60 mL. FLUOROSCOPY TIME:  Fluoroscopy Time: 8 minutes 42 seconds (822 mGy). COMPLICATIONS: None immediate. TECHNIQUE: Informed written consent was obtained from the patient after a thorough discussion of the procedural risks, benefits and alternatives. All questions were addressed. Maximal Sterile Barrier Technique was utilized including caps, mask, sterile gowns, sterile gloves, sterile drape, hand hygiene and skin antiseptic. A timeout was performed prior to the initiation of the procedure. The right antecubital fossa was prepped and draped in the usual sterile fashion. Thereafter using modified  Seldinger technique, transbrachial access into the right brachial artery was obtained without difficulty. Over a 0.035 inch guidewire, a 5 French Pinnacle sheath was inserted. Through this, and also over 0.035 inch guidewire, a 5 French Simmons 2 catheter was advanced to the aortic arch region and formed without difficulty. It was selectively positioned in the right common carotid artery, the right vertebral artery, the left common carotid artery and the left subclavian artery. FINDINGS: The right vertebral artery origin is widely patent. The vessel is seen to opacify normally to the cranial skull base. Wide patency is seen of the right vertebrobasilar junction and the right posterior-inferior cerebellar artery. The opacified portion of the basilar artery, the posterior cerebral arteries, the superior cerebellar arteries and the anterior-inferior cerebellar arteries appear normal into the capillary and venous phases. Non-opacified blood is seen in the basilar artery from contralateral vertebral artery. The left common carotid arteriogram demonstrates the left external carotid artery and its major branches to be widely patent. The left internal carotid artery at the bulb to the cranial skull base is widely patent. There is mild tortuosity in the proximal 1/3 of the left internal carotid artery. The petrous, cavernous and the supraclinoid segments are widely patent. The left middle cerebral artery and the left anterior cerebral artery opacify normally into the capillary and venous phases. The venous phase demonstrates retrograde opacification of the left transverse sinus in its proximal 2/3 primarily from the ipsilateral vein of Labbe. The right common carotid arteriogram demonstrates the right external carotid artery and its major branches to be widely patent. The right internal carotid artery at the bulb to the cranial skull base is widely patent. There is a double U shaped tortuosity of the middle 1/3 of the right  internal carotid artery without evidence of kinking. More distally, the right internal carotid artery is seen to opacify normally to the cranial skull base. The distal petrous, and the proximal cavernous segments demonstrate mild fusiform dilatation. Distal to this the supraclinoid right ICA is normally patent. The right middle cerebral artery and the right anterior cerebral artery demonstrate normal opacification into the capillary and venous phases. The right transverse sinus consistently demonstrates focal areas of moderate irregularity with questionable stenosis at its distal aspect. The left vertebral artery origin demonstrates mild stenosis. The vessel is seen to opacify normally to the cranial skull base. Wide patency is seen of the left vertebrobasilar junction and the left posterior-inferior cerebellar artery. The opacified portion of the distal left vertebrobasilar junction, the basilar artery, the posterior cerebral arteries, the superior cerebellar arteries and the anterior-cerebellar arteries is grossly patent into the delayed arterial phase. IMPRESSION: Angiographically no evidence of intracranial aneurysms, arteriovenous malformations, dural AV fistula, dissections, or of intraluminal filling defects. Venous outflow demonstrates areas of caliber irregularity involving the right transverse sinus with suspicion of stenosis. However, patient motion precludes fine detail in this region. PLAN: As per referring physician. Electronically Signed  By: Julieanne CottonSanjeev  Deveshwar M.D.   On: 01/18/2018 13:10   Ir Angio Vertebral Sel Subclavian Innominate Uni L Mod Sed  Result Date: 01/19/2018 CLINICAL DATA:  Focal right frontal hemorrhage. 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 EXAM: IR ANGIO VERTEBRAL SEL VERTEBRAL UNI RIGHT MOD SED; IR ULTRASOUND GUIDANCE VASC ACCESS RIGHT; IR ANGIO VERTEBRAL SEL SUBCLAVIAN INNOMINATE UNI LEFT MOD SED; BILATERAL COMMON CAROTID AND INNOMINATE ANGIOGRAPHY  COMPARISON:  MRI MRA of the brain of 01/14/2018. MEDICATIONS: Heparin 0 units IV; no antibiotic was administered within 1 hour of the procedure. ANESTHESIA/SEDATION: Versed 0.5 mg IV; Fentanyl 37.5 mcg IV. Moderate Sedation Time:  75 minutes. The patient was continuously monitored during the procedure by the interventional radiology nurse under my direct supervision. CONTRAST:  Isovue 300 approximately 60 mL. FLUOROSCOPY TIME:  Fluoroscopy Time: 8 minutes 42 seconds (822 mGy). COMPLICATIONS: None immediate. TECHNIQUE: Informed written consent was obtained from the patient after a thorough discussion of the procedural risks, benefits and alternatives. All questions were addressed. Maximal Sterile Barrier Technique was utilized including caps, mask, sterile gowns, sterile gloves, sterile drape, hand hygiene and skin antiseptic. A timeout was performed prior to the initiation of the procedure. The right antecubital fossa was prepped and draped in the usual sterile fashion. Thereafter using modified Seldinger technique, transbrachial access into the right brachial artery was obtained without difficulty. Over a 0.035 inch guidewire, a 5 French Pinnacle sheath was inserted. Through this, and also over 0.035 inch guidewire, a 5 French Simmons 2 catheter was advanced to the aortic arch region and formed without difficulty. It was selectively positioned in the right common carotid artery, the right vertebral artery, the left common carotid artery and the left subclavian artery. FINDINGS: The right vertebral artery origin is widely patent. The vessel is seen to opacify normally to the cranial skull base. Wide patency is seen of the right vertebrobasilar junction and the right posterior-inferior cerebellar artery. The opacified portion of the basilar artery, the posterior cerebral arteries, the superior cerebellar arteries and the anterior-inferior cerebellar arteries appear normal into the capillary and venous phases.  Non-opacified blood is seen in the basilar artery from contralateral vertebral artery. The left common carotid arteriogram demonstrates the left external carotid artery and its major branches to be widely patent. The left internal carotid artery at the bulb to the cranial skull base is widely patent. There is mild tortuosity in the proximal 1/3 of the left internal carotid artery. The petrous, cavernous and the supraclinoid segments are widely patent. The left middle cerebral artery and the left anterior cerebral artery opacify normally into the capillary and venous phases. The venous phase demonstrates retrograde opacification of the left transverse sinus in its proximal 2/3 primarily from the ipsilateral vein of Labbe. The right common carotid arteriogram demonstrates the right external carotid artery and its major branches to be widely patent. The right internal carotid artery at the bulb to the cranial skull base is widely patent. There is a double U shaped tortuosity of the middle 1/3 of the right internal carotid artery without evidence of kinking. More distally, the right internal carotid artery is seen to opacify normally to the cranial skull base. The distal petrous, and the proximal cavernous segments demonstrate mild fusiform dilatation. Distal to this the supraclinoid right ICA is normally patent. The right middle cerebral artery and the right anterior cerebral artery demonstrate normal opacification into the capillary and venous phases. The right transverse sinus consistently demonstrates focal areas of moderate  irregularity with questionable stenosis at its distal aspect. The left vertebral artery origin demonstrates mild stenosis. The vessel is seen to opacify normally to the cranial skull base. Wide patency is seen of the left vertebrobasilar junction and the left posterior-inferior cerebellar artery. The opacified portion of the distal left vertebrobasilar junction, the basilar artery, the posterior  cerebral arteries, the superior cerebellar arteries and the anterior-cerebellar arteries is grossly patent into the delayed arterial phase. IMPRESSION: Angiographically no evidence of intracranial aneurysms, arteriovenous malformations, dural AV fistula, dissections, or of intraluminal filling defects. Venous outflow demonstrates areas of caliber irregularity involving the right transverse sinus with suspicion of stenosis. However, patient motion precludes fine detail in this region. PLAN: As per referring physician. Electronically Signed   By: Julieanne Cotton M.D.   On: 01/18/2018 13:10   Ir Angio Vertebral Sel Vertebral Uni R Mod Sed  Result Date: 01/19/2018 CLINICAL DATA:  Focal right frontal hemorrhage. 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 EXAM: IR ANGIO VERTEBRAL SEL VERTEBRAL UNI RIGHT MOD SED; IR ULTRASOUND GUIDANCE VASC ACCESS RIGHT; IR ANGIO VERTEBRAL SEL SUBCLAVIAN INNOMINATE UNI LEFT MOD SED; BILATERAL COMMON CAROTID AND INNOMINATE ANGIOGRAPHY COMPARISON:  MRI MRA of the brain of 01/14/2018. MEDICATIONS: Heparin 0 units IV; no antibiotic was administered within 1 hour of the procedure. ANESTHESIA/SEDATION: Versed 0.5 mg IV; Fentanyl 37.5 mcg IV. Moderate Sedation Time:  75 minutes. The patient was continuously monitored during the procedure by the interventional radiology nurse under my direct supervision. CONTRAST:  Isovue 300 approximately 60 mL. FLUOROSCOPY TIME:  Fluoroscopy Time: 8 minutes 42 seconds (822 mGy). COMPLICATIONS: None immediate. TECHNIQUE: Informed written consent was obtained from the patient after a thorough discussion of the procedural risks, benefits and alternatives. All questions were addressed. Maximal Sterile Barrier Technique was utilized including caps, mask, sterile gowns, sterile gloves, sterile drape, hand hygiene and skin antiseptic. A timeout was performed prior to the initiation of the procedure. The right antecubital fossa was prepped  and draped in the usual sterile fashion. Thereafter using modified Seldinger technique, transbrachial access into the right brachial artery was obtained without difficulty. Over a 0.035 inch guidewire, a 5 French Pinnacle sheath was inserted. Through this, and also over 0.035 inch guidewire, a 5 French Simmons 2 catheter was advanced to the aortic arch region and formed without difficulty. It was selectively positioned in the right common carotid artery, the right vertebral artery, the left common carotid artery and the left subclavian artery. FINDINGS: The right vertebral artery origin is widely patent. The vessel is seen to opacify normally to the cranial skull base. Wide patency is seen of the right vertebrobasilar junction and the right posterior-inferior cerebellar artery. The opacified portion of the basilar artery, the posterior cerebral arteries, the superior cerebellar arteries and the anterior-inferior cerebellar arteries appear normal into the capillary and venous phases. Non-opacified blood is seen in the basilar artery from contralateral vertebral artery. The left common carotid arteriogram demonstrates the left external carotid artery and its major branches to be widely patent. The left internal carotid artery at the bulb to the cranial skull base is widely patent. There is mild tortuosity in the proximal 1/3 of the left internal carotid artery. The petrous, cavernous and the supraclinoid segments are widely patent. The left middle cerebral artery and the left anterior cerebral artery opacify normally into the capillary and venous phases. The venous phase demonstrates retrograde opacification of the left transverse sinus in its proximal 2/3 primarily from the ipsilateral vein of  Labbe. The right common carotid arteriogram demonstrates the right external carotid artery and its major branches to be widely patent. The right internal carotid artery at the bulb to the cranial skull base is widely patent.  There is a double U shaped tortuosity of the middle 1/3 of the right internal carotid artery without evidence of kinking. More distally, the right internal carotid artery is seen to opacify normally to the cranial skull base. The distal petrous, and the proximal cavernous segments demonstrate mild fusiform dilatation. Distal to this the supraclinoid right ICA is normally patent. The right middle cerebral artery and the right anterior cerebral artery demonstrate normal opacification into the capillary and venous phases. The right transverse sinus consistently demonstrates focal areas of moderate irregularity with questionable stenosis at its distal aspect. The left vertebral artery origin demonstrates mild stenosis. The vessel is seen to opacify normally to the cranial skull base. Wide patency is seen of the left vertebrobasilar junction and the left posterior-inferior cerebellar artery. The opacified portion of the distal left vertebrobasilar junction, the basilar artery, the posterior cerebral arteries, the superior cerebellar arteries and the anterior-cerebellar arteries is grossly patent into the delayed arterial phase. IMPRESSION: Angiographically no evidence of intracranial aneurysms, arteriovenous malformations, dural AV fistula, dissections, or of intraluminal filling defects. Venous outflow demonstrates areas of caliber irregularity involving the right transverse sinus with suspicion of stenosis. However, patient motion precludes fine detail in this region. PLAN: As per referring physician. Electronically Signed   By: Julieanne Cotton M.D.   On: 01/18/2018 13:10    Lab Data:  CBC: Recent Labs  Lab 01/31/18 0334 02/05/18 0411  WBC 6.5 5.6  HGB 9.1* 10.1*  HCT 31.0* 33.9*  MCV 89.6 90.4  PLT 422* 338   Basic Metabolic Panel: Recent Labs  Lab 01/31/18 0334 02/05/18 0411  NA 137 137  K 4.3 3.8  CL 101 104  CO2 27 28  GLUCOSE 89 111*  BUN 16 10  CREATININE 0.64 0.70  CALCIUM 9.0 9.0     GFR: Estimated Creatinine Clearance: 106.1 mL/min (by C-G formula based on SCr of 0.7 mg/dL). Liver Function Tests: No results for input(s): AST, ALT, ALKPHOS, BILITOT, PROT, ALBUMIN in the last 168 hours. No results for input(s): LIPASE, AMYLASE in the last 168 hours. No results for input(s): AMMONIA in the last 168 hours. Coagulation Profile: No results for input(s): INR, PROTIME in the last 168 hours. Cardiac Enzymes: No results for input(s): CKTOTAL, CKMB, CKMBINDEX, TROPONINI in the last 168 hours. BNP (last 3 results) No results for input(s): PROBNP in the last 8760 hours. HbA1C: No results for input(s): HGBA1C in the last 72 hours. CBG: No results for input(s): GLUCAP in the last 168 hours. Lipid Profile: No results for input(s): CHOL, HDL, LDLCALC, TRIG, CHOLHDL, LDLDIRECT in the last 72 hours. Thyroid Function Tests: No results for input(s): TSH, T4TOTAL, FREET4, T3FREE, THYROIDAB in the last 72 hours. Anemia Panel: No results for input(s): VITAMINB12, FOLATE, FERRITIN, TIBC, IRON, RETICCTPCT in the last 72 hours. Urine analysis:    Component Value Date/Time   COLORURINE YELLOW 01/12/2018 2050   APPEARANCEUR CLEAR 01/12/2018 2050   LABSPEC 1.014 01/12/2018 2050   PHURINE 5.0 01/12/2018 2050   GLUCOSEU NEGATIVE 01/12/2018 2050   HGBUR NEGATIVE 01/12/2018 2050   BILIRUBINUR NEGATIVE 01/12/2018 2050   KETONESUR NEGATIVE 01/12/2018 2050   PROTEINUR NEGATIVE 01/12/2018 2050   NITRITE NEGATIVE 01/12/2018 2050   LEUKOCYTESUR NEGATIVE 01/12/2018 2050     Sudais Banghart M.D. [facility]  02/05/2018, 3:27 PM  Pager: [phone] Between 7am to 7pm - call Pager - [phone]  After 7pm go to [email or web address] - password [email or web address]  Call night coverage person covering after 7pm

## 2018-02-05 NOTE — Plan of Care (Signed)
  Problem: Pain Managment: Goal: General experience of comfort will improve Outcome: Progressing  Patient's pain is ongoing in his feet. Says it is a chronic condition. Pain remains around a 7/10.

## 2018-02-05 NOTE — Progress Notes (Signed)
Physical Therapy Treatment Patient Details Name: Marcus [name] MRN: [id number] DOB: 12/19/1975 Today's Date: 02/05/2018    History of Present Illness  42 yo homeless man. 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 Patient came  with exacerbation of his back pain. 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 PMH - hep c, polysubstance abuse.     PT Comments    Pt now abe to amb without assist using rolling walker. Pt only using walker due to reports of painful feet. Have instructed pt to amb 3x/day on his own in the hallway with the walker when he is not hooked up to IV pole. Pt is capable of doing this on his own and does not need staff assistance for this. Next PT visit will plan to practice stairs and then will likely DC from PT services.  Follow Up Recommendations  No PT follow up     Equipment Recommendations  Rolling walker with 5" wheels    Recommendations for Other Services       Precautions / Restrictions Precautions Precautions: Fall Restrictions Weight Bearing Restrictions: No    Mobility  Bed Mobility Overal bed mobility: Modified Independent                Transfers Overall transfer level: Modified independent Equipment used: None Transfers: Sit to/from Stand Sit to Stand: Modified independent (Device/Increase time)            Ambulation/Gait Ambulation/Gait assistance: Modified independent (Device/Increase time) Ambulation Distance (Feet): 250 Feet Assistive device: Rolling walker (2 wheeled) Gait Pattern/deviations: Step-through pattern;Decreased stride length Gait velocity: decr Gait velocity interpretation: 1.31 - 2.62 ft/sec, indicative of limited community  ambulator General Gait Details: Pt with steady gait with rolling walker. Pt using walker due to painful feet.   Stairs             Wheelchair Mobility    Modified Rankin (Stroke Patients Only)       Balance Overall balance assessment: Needs assistance Sitting-balance support: No upper extremity supported;Feet supported Sitting balance-Leahy Scale: Good     Standing balance support: No upper extremity supported;During functional activity Standing balance-Leahy Scale: Good Standing balance comment: able to manuver in room and bathroom without loss of balance                            Cognition Arousal/Alertness: Awake/alert Behavior During Therapy: WFL for tasks assessed/performed Overall Cognitive Status: Within Functional Limits for tasks assessed                                        Exercises      General Comments        Pertinent Vitals/Pain Pain Assessment: Faces Faces Pain Scale: Hurts even more Pain Location: feet Pain Descriptors / Indicators: Sore Pain Intervention(s): Limited activity within patient's tolerance;Monitored during session    Home Living                      Prior Function  PT Goals (current goals can now be found in the care plan section) Progress towards PT goals: Progressing toward goals    Frequency    Min 2X/week      PT Plan Frequency needs to be updated;Discharge plan needs to be updated    Co-evaluation              AM-PAC PT "6 Clicks" Daily Activity  Outcome Measure  Difficulty turning over in bed (including adjusting bedclothes, sheets and blankets)?: None Difficulty moving from lying on back to sitting on the side of the bed? : None Difficulty sitting down on and standing up from a chair with arms (e.g., wheelchair, bedside commode, etc,.)?: None Help needed moving to and from a bed to chair (including a wheelchair)?: None Help needed walking in  hospital room?: None Help needed climbing 3-5 steps with a railing? : A Little 6 Click Score: 23    End of Session   Activity Tolerance: Patient tolerated treatment well Patient left: in bed;with call bell/phone within reach Nurse Communication: Mobility status PT Visit Diagnosis: Unsteadiness on feet (R26.81);Muscle weakness (generalized) (M62.81)     Time: 1610-9604 PT Time Calculation (min) (ACUTE ONLY): 12 min  Charges:  $Gait Training: 8-22 mins                    G Codes:       [facility] PT [phone]    Angelina Ok [facility] 02/05/2018, 2:20 PM

## 2018-02-06 MED ORDER — ONDANSETRON HCL 4 MG/2ML IJ SOLN
4.0000 mg | Freq: Four times a day (QID) | INTRAMUSCULAR | Status: DC | PRN
  Administered 2018-02-06: 4 mg via INTRAVENOUS
  Filled 2018-02-06: qty 2

## 2018-02-06 NOTE — Progress Notes (Signed)
[facility]                                                                              Patient Demographics  Marcus [name], is a 42 y.o. male, DOB - 1976-03-27, ZOX:[id number]  Admit date - 01/14/2018   Admitting Physician Otho Najjar, MD  Outpatient Primary MD for the patient is [facility]  Outpatient specialists:   LOS - 23  days   Medical records reviewed and are as summarized below:    Chief Complaint  Patient presents with  . 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 The patient had a vegetation on the posterior leaflet of his mitral valve, and was found to have a subtotal occlusion of his aorta at the level of the IMA, as well as splenic and renal infarcts, treated with coumadin and antibiotics. He presented to the [facility] ED 5/15 with complaints of chest pain, back pain and found to be encephalopathic. 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 ID was consulted, patient placed on penicillin G. Blood cultures repeated at admission here remained negative. Coumadin was stopped due to ICH. TTE showed a 1.4 cm mobile vegetation on the posterior leaflet on the MV with severe regurgitation, though he's had no evidence of heart failure. Vascular srugery also consulted for distal aortoiliac occlusion, felt the patient could not undergo repair as there are no non-prosthetic options and this is contraindicated due to infection. Noted that distal pulses with monophasic with intact motor and sensation to both feet suggesting that these were chronic findings. 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 Palliative care was recommended and has followed with the patient, though he seems to have very few social supports.  Plan is to continue IV antibiotics x6 weeks through 6/11, then indefinite oral amoxicillin.    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  ID was consulted  -ID recommended IV penicillin for 6 weeks till 6/11, then indefinite amoxicillin. 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 -Palliative consulted, DNR  Polysubstance abuse, opioid use disorder Continue Suboxone, Dr. Oswaldo Done has seen the patient   Depression Continue Wellbutrin  Tobacco use Continue nicotine gum  Chronic hepatitis B and C -Outpatient follow-up with ID   MCA CVA/ICH, M2 occlusion -Cerebral angiogram showed no cerebral aneurysm, no further work-up per neurology, follow-up outpatient in 4 weeks with stroke clinic  Thrombocytosis, leukocytosis Improving, within normal limits  Anemia of chronic disease - transfuse if below 7.5 -H&H  currently stable  Moderate malnutrition Continue nutritional supplements  Sacral wound, unstageable -Wound care consulted, recommended frequent turning, repositioning to the patient  Code Status: DNR DVT Prophylaxis:   SCD's Family Communication: Discussed in detail with the patient, all imaging results, lab results explained to the patient    Disposition Plan: No acute issues today, antibiotics to continue till 6/11 Time Spent in minutes  15 minutes  Procedures:  TTE on 5/16  Consultants:   Cardiology CT VS Vascular surgery ID Palliative care  Antimicrobials:   Penicillin G   Medications  Scheduled Meds: . buprenorphine-naloxone  1 tablet Sublingual Daily  . buPROPion  150 mg Oral Daily  . famotidine  20 mg Oral BID  . feeding supplement (ENSURE ENLIVE)  237 mL Oral TID PC & HS  . polyethylene glycol  17 g Oral Daily  . senna-docusate  1 tablet Oral QHS   Continuous Infusions: . pencillin G potassium IV 4 Million Units (02/06/18 1150)   PRN Meds:.acetaminophen, bisacodyl, nicotine polacrilex, RESOURCE THICKENUP CLEAR   Antibiotics   Anti-infectives (From admission, onward)   Start     Dose/Rate Route Frequency Ordered Stop   01/19/18 1600  penicillin G potassium 4 Million Units in dextrose 5 % 250 mL IVPB     4 Million Units 250 mL/hr over 60 Minutes Intravenous Every 4 hours 01/19/18 1424 02/09/18 2359   01/18/18 2200  vancomycin (VANCOCIN) IVPB 750 mg/150 ml premix  Status:  Discontinued     750 mg 150 mL/hr over 60 Minutes Intravenous Every 8 hours 01/18/18 1431 01/19/18 1424   01/14/18 2000  vancomycin (VANCOCIN) IVPB 1000 mg/200 mL premix  Status:  Discontinued     1,000 mg 200 mL/hr over 60 Minutes Intravenous Every 8 hours 01/14/18 1103 01/18/18 1431   01/14/18 2000  piperacillin-tazobactam (ZOSYN) IVPB 3.375 g  Status:  Discontinued     3.375 g 12.5 mL/hr over 240 Minutes Intravenous Every 8 hours 01/14/18 1103 01/16/18 1211   01/14/18 1100   vancomycin (VANCOCIN) 1,250 mg in sodium chloride 0.9 % 250 mL IVPB     1,250 mg 166.7 mL/hr over 90 Minutes Intravenous  Once 01/14/18 1036 01/14/18 1631   01/14/18 1045  piperacillin-tazobactam (ZOSYN) IVPB 3.375 g     3.375 g 100 mL/hr over 30 Minutes Intravenous  Once 01/14/18 1036 01/14/18 1228        Subjective:   Marcus [name] was seen and examined today.  No complaints, wants to talk to social work regarding plans about disposition after antibiotics are completed.  No fevers or chills.  No acute issues overnight.  No dizziness, chest pain, shortness of breath, nausea vomiting or abdominal pain.    Objective:   Vitals:   02/05/18 1500 02/05/18 2216 02/06/18 0227 02/06/18 0500  BP: (!) 153/124 (!) 92/58  (!) 98/49  Pulse: 100 87  99  Resp: 19 18    Temp: 98.2 F (36.8 C) 98.2 F (36.8 C)  98.3 F (36.8 C)  TempSrc:  Oral Oral  Oral  SpO2: 97% 95%  99%  Weight:   62.2 kg (137 lb 3.2 oz)   Height:        Intake/Output Summary (Last 24 hours) at 02/06/2018 1203 Last data filed at 02/06/2018 0843 Gross per 24 hour  Intake 120 ml  Output 1200 ml  Net -1080 ml     Wt Readings from Last 3 Encounters:  02/06/18 62.2 kg (137 lb 3.2 oz)  01/14/18 63.5 kg (140 lb)  01/13/18 63.5 kg (140 lb)     Exam    General: Alert and oriented x 3, NAD  Eyes:   HEENT:    Cardiovascular: S1 S2 auscultated, Regular rate and rhythm. No pedal edema b/l  Respiratory: Clear to auscultation bilaterally, no wheezing, rales or rhonchi  Gastrointestinal: Soft, nontender, nondistended, + bowel sounds  Ext: no pedal edema bilaterally  Neuro: no new deficit  Musculoskeletal: No digital cyanosis, clubbing  Skin: Sacral wound dressing intact  Psych: Normal affect and demeanor, alert and oriented x3   Data Reviewed:  I have personally reviewed following labs and imaging studies  Micro Results No results found for this or any previous visit (from the past 240 hour(s)).  Radiology  Reports Dg Chest 2 View  Result Date: 01/14/2018 CLINICAL DATA:  Left chest pain EXAM: CHEST - 2 VIEW COMPARISON:  01/13/2018 FINDINGS: Heart and mediastinal contours are within normal limits. No focal opacities or effusions. No acute bony abnormality. IMPRESSION: No active cardiopulmonary disease. Electronically Signed   By: Charlett Nose M.D.   On: 01/14/2018 08:29   Dg Chest 2 View  Result Date: 01/13/2018 CLINICAL DATA:  Weakness, hungry, pain.  History of hepatitis-C. EXAM: CHEST - 2 VIEW COMPARISON:  None. FINDINGS: The heart size and mediastinal contours are within normal limits. Both lungs are clear. The visualized skeletal structures are unremarkable. IMPRESSION: Negative. Electronically Signed   By: Awilda Metro M.D.   On: 01/13/2018 19:46   Ct Head Wo Contrast  Result Date: 01/24/2018 CLINICAL DATA:  Continued surveillance infarcts and hemorrhage. EXAM: CT HEAD WITHOUT CONTRAST TECHNIQUE: Contiguous axial images were obtained from the base of the skull through the vertex without intravenous contrast. COMPARISON:  CT head 01/14/2018. MR head 01/14/2018. CT angiogram 01/18/2018. FINDINGS: Brain: Previously identified subacute infarcts are involving into areas of chronic ischemia affecting the BILATERAL RIGHT greater than LEFT subcortical white matter. Tiny focus of hyperattenuation, RIGHT frontal cortex, image 23 appears improved. This measures no more than 2 x 3 mm on today's exam. Vascular: Improved appearance of the previously identified RIGHT M2 occlusion. Some hyperattenuation of vessel wall in the sylvian fissure, see series 3, image 12. Skull: Normal. Negative for fracture or focal lesion. Sinuses/Orbits: No acute finding. Other: None. IMPRESSION: Previously identified subacute white matter infarcts are have evolved into areas of chronic ischemia. Tiny focus of hyperattenuation RIGHT frontal cortex is improved. Improved appearance of the previously identified RIGHT M2 occlusion.  Electronically Signed   By: Elsie Stain M.D.   On: 01/24/2018 17:38   Ct Head Wo Contrast  Addendum Date: 01/14/2018   ADDENDUM REPORT: 01/14/2018 10:35 ADDENDUM: Study discussed by telephone with Dr. Shaune Pollack on 01/14/2018 at 1015 hours. 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 THEREFORE, the appearance is highly likely to reflect SEPTIC EMBOLI, with acute embolic related hemorrhage in the right superior frontal lobe. Electronically Signed   By: Odessa Fleming M.D.   On: 01/14/2018 10:35   Result Date: 01/14/2018 CLINICAL  DATA:  42 year old male with altered level consciousness, lightheadedness and dizzy onset about 1 hour ago. EXAM: CT HEAD WITHOUT CONTRAST TECHNIQUE: Contiguous axial images were obtained from the base of the skull through the vertex without intravenous contrast. COMPARISON:  None. FINDINGS: Brain: Cytotoxic edema at the right anterior insula and frontal operculum. No associated hemorrhage or mass effect in those segments. Small amorphous 7 millimeter focus of hyperdensity at the right superior frontal gyrus on series 3, image 25 and sagittal image 26. On coronal images there is adjacent hypodensity (coronal image 34). No regional mass effect. No other cytotoxic edema identified. ASPECTS 7 (abnormal insula, M4, M 5). More chronic appearing left corona radiata white matter hypodensity on the left. No intracranial mass effect or midline shift. No ventriculomegaly. Normal basilar cisterns. Vascular: Abnormal cylindrical hyperdensity at the right MCA proximal M2 segment with Vessel expansion (series 3, image 14 and series 6, image 21). Skull: No acute osseous abnormality identified. Sinuses/Orbits: Visualized paranasal sinuses and mastoids are well pneumatized. Other: Mildly Disconjugate gaze, otherwise negative orbits soft tissues. No acute scalp soft tissue findings, scattered benign appearing scalp dermal nodularity. IMPRESSION: 1. Evidence of large vessel occlusion at the right  MCA proximal M2. 2. Possible trace focus of acute hemorrhage in the right superior frontal gyrus on series 3, image 25. No other acute intracranial hemorrhage identified. 3. Right MCA territory cytotoxic edema, ASPECTS 7 (although note suspicion of trace acute hemorrhage in #2). 4. Chronic appearing left MCA white matter disease. Electronically Signed: By: Odessa Fleming M.D. On: 01/14/2018 10:18   Ct Angio Abdomen W &/or Wo Contrast  Result Date: 01/21/2018 CLINICAL DATA:  Aorta occlusion EXAM: CT ANGIOGRAPHY ABDOMEN TECHNIQUE: Multidetector CT imaging of the abdomen was performed using the standard protocol during bolus administration of intravenous contrast. Multiplanar reconstructed images and MIPs were obtained and reviewed to evaluate the vascular anatomy. CONTRAST:  ISOVUE-370 IOPAMIDOL (ISOVUE-370) INJECTION 76% COMPARISON:  12/29/2017 at [facility], report only FINDINGS: VASCULAR Aorta: The aorta is abruptly occluded just below the takeoff of the IMA. The borders of the abdominal aorta are poorly defined at the level of the occlusion. Mild aneurysmal dilatation of the distal aorta measuring up to 3.7 cm is suspected. At the level of the occlusion, there is mixed low-density and isodensity within the thrombus, as well as a lack of atherosclerotic calcification, suggesting subacute rather than acute age. There is also a mild thickening of the wall of the aorta with stranding in the adjacent fat. These findings suggest an inflammatory process. Celiac: Patent and ectatic. There is abrupt occlusion of the splenic artery, 2 cm from the hilum of the spleen. Distal branches reconstitute. The hepatic artery and its branches are grossly patent. Gastroduodenal artery is patent. SMA: Patent and ectatic. Renals: 2 right renal arteries and a single left renal artery are patent. IMA: Patent. Inflow: Bilateral common iliac arteries are occluded. There is some filling of the mid and distal left common  iliac artery with contrast flow. The left common iliac artery measures 1.9 cm in maximal caliber. Right common iliac artery measures 3.6 cm in caliber. The left internal and external iliac arteries reconstitute and are partially imaged. Veins: No evidence of DVT. Review of the MIP images confirms the above findings. NON-VASCULAR Lower chest: No acute abnormality. Hepatobiliary: Unremarkable Pancreas: Unremarkable Spleen: There is a large nonspecific there is a complex cystic lesion in the spleen measuring up to 11.6 cm. Adrenals/Urinary Tract: There is scarring in the lower  pole of the left kidney associated with wedge-shaped areas of low density. These findings likely represent small infarcts. This is also present in the upper pole of the right kidney to a lesser degree. Stomach/Bowel: No obvious mass in the colon. No evidence of small-bowel obstruction. Lymphatic: Small para-aortic lymph nodes are present. Other: No free fluid.  Postoperative changes from appendectomy. Musculoskeletal: No vertebral compression deformity. Bilateral L5 pars defects. No evidence of anterolisthesis. IMPRESSION: VASCULAR There is abrupt occlusion of the aorta below the level of the IMA as described on the prior report. Mixed density within the aorta suggest subacute age. Also, there is thickening and inflammatory change of the wall of the aorta suggesting vasculitis or mycotic aneurysm. There is also associated occlusion of the bilateral common iliac arteries. Focal occlusion of the splenic artery. NON-VASCULAR Complex cystic lesion in the spleen is nonspecific. Small infarcts in scarring in the kidneys bilaterally. Electronically Signed   By: Jolaine Click M.D.   On: 01/21/2018 12:05   Mr Maxine Glenn Head Wo Contrast  Result Date: 01/14/2018 CLINICAL DATA:  In hospital for sepsis, altered mental status. History of hepatitis-C, polysubstance abuse and endocarditis. History of head injury 1 month ago. Follow-up RIGHT MCA stroke. EXAM: MRI  HEAD WITHOUT CONTRAST MRA HEAD WITHOUT CONTRAST TECHNIQUE: Multiplanar, multiecho pulse sequences of the brain and surrounding structures were obtained without intravenous contrast. Angiographic images of the head were obtained using MRA technique without contrast. COMPARISON:  CT HEAD Jan 14, 2018 at 0951 hours. FINDINGS: MRI HEAD FINDINGS Multiple sequences are mildly or moderately motion degraded. INTRACRANIAL CONTENTS: Patchy reduced diffusion RIGHT frontal lobe and RIGHT basal ganglia, LEFT posterior frontal lobe with normalized ADC values and focal T2 shine through. Associated FLAIR T2 hyperintense signal and faint hemosiderin staining. Slightly expanded FLAIR T2 hyperintense RIGHT basal ganglia. Superimposed focal susceptibility artifact bifrontal lobes, RIGHT parietooccipital lobe. Multiple additional scattered micro hemorrhages. Mild parenchymal brain volume loss. No hydrocephalus. Old small RIGHT cerebellar infarcts. VASCULAR: Susceptibility artifact and T1 shortening RIGHT sylvian fissure corresponding to MCA density. SKULL AND UPPER CERVICAL SPINE: No abnormal sellar expansion. No suspicious calvarial bone marrow signal. Craniocervical junction maintained. SINUSES/ORBITS: The mastoid air-cells and included paranasal sinuses are well-aerated.The included ocular globes and orbital contents are non-suspicious. OTHER: None. MRA HEAD FINDINGS ANTERIOR CIRCULATION: Normal flow related enhancement of the included cervical, petrous, cavernous and supraclinoid internal carotid arteries. Patent anterior communicating artery. Mildly attenuated RIGHT MCA flow related enhancement, occluded RIGHT M2 segment with transient aneurysmal reconstitution. No flow limiting stenosis. POSTERIOR CIRCULATION: vertebral artery is dominant. Basilar artery is patent, with normal flow related enhancement of the main branch vessels. Patent posterior cerebral arteries. Robust RIGHT and smaller LEFT posterior communicating arteries  present. No large vessel occlusion, flow limiting stenosis,  aneurysm. ANATOMIC VARIANTS: None. Source images and MIP images were reviewed. IMPRESSION: MRI HEAD: 1. Multifocal subacute bilateral MCA/watershed territory infarcts. 2. Peripheral susceptibility artifact, septic emboli suspected. There may be superimposed shear injury or underlying chronic hypertension. 3. Mild parenchymal brain volume loss for age. 4. Old small RIGHT cerebellar infarcts. MRA HEAD: 1. 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 2. Otherwise negative MRA head. Examination reviewed by Dr. Augusto Gamble with Dr. Wilford Corner, Neurology on Jan 14, 2018 at approximately 1425 hours. Electronically Signed   By: Awilda Metro M.D.   On: 01/14/2018 14:38   Mr Brain Wo Contrast  Result Date: 01/14/2018 CLINICAL DATA:  In hospital for sepsis, altered  mental status. History of hepatitis-C, polysubstance abuse and endocarditis. History of head injury 1 month ago. Follow-up RIGHT MCA stroke. EXAM: MRI HEAD WITHOUT CONTRAST MRA HEAD WITHOUT CONTRAST TECHNIQUE: Multiplanar, multiecho pulse sequences of the brain and surrounding structures were obtained without intravenous contrast. Angiographic images of the head were obtained using MRA technique without contrast. COMPARISON:  CT HEAD Jan 14, 2018 at 0951 hours. FINDINGS: MRI HEAD FINDINGS Multiple sequences are mildly or moderately motion degraded. INTRACRANIAL CONTENTS: Patchy reduced diffusion RIGHT frontal lobe and RIGHT basal ganglia, LEFT posterior frontal lobe with normalized ADC values and focal T2 shine through. Associated FLAIR T2 hyperintense signal and faint hemosiderin staining. Slightly expanded FLAIR T2 hyperintense RIGHT basal ganglia. Superimposed focal susceptibility artifact bifrontal lobes, RIGHT parietooccipital lobe. Multiple additional scattered micro hemorrhages. Mild parenchymal brain  volume loss. No hydrocephalus. Old small RIGHT cerebellar infarcts. VASCULAR: Susceptibility artifact and T1 shortening RIGHT sylvian fissure corresponding to MCA density. SKULL AND UPPER CERVICAL SPINE: No abnormal sellar expansion. No suspicious calvarial bone marrow signal. Craniocervical junction maintained. SINUSES/ORBITS: The mastoid air-cells and included paranasal sinuses are well-aerated.The included ocular globes and orbital contents are non-suspicious. OTHER: None. MRA HEAD FINDINGS ANTERIOR CIRCULATION: Normal flow related enhancement of the included cervical, petrous, cavernous and supraclinoid internal carotid arteries. Patent anterior communicating artery. Mildly attenuated RIGHT MCA flow related enhancement, occluded RIGHT M2 segment with transient aneurysmal reconstitution. No flow limiting stenosis. POSTERIOR CIRCULATION: vertebral artery is dominant. Basilar artery is patent, with normal flow related enhancement of the main branch vessels. Patent posterior cerebral arteries. Robust RIGHT and smaller LEFT posterior communicating arteries present. No large vessel occlusion, flow limiting stenosis,  aneurysm. ANATOMIC VARIANTS: None. Source images and MIP images were reviewed. IMPRESSION: MRI HEAD: 1. Multifocal subacute bilateral MCA/watershed territory infarcts. 2. Peripheral susceptibility artifact, septic emboli suspected. There may be superimposed shear injury or underlying chronic hypertension. 3. Mild parenchymal brain volume loss for age. 4. Old small RIGHT cerebellar infarcts. MRA HEAD: 1. 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 2. Otherwise negative MRA head. Examination reviewed by Dr. Augusto Gamble with Dr. Wilford Corner, Neurology on Jan 14, 2018 at approximately 1425 hours. Electronically Signed   By: Awilda Metro M.D.   On: 01/14/2018 14:38   Dg Chest Port 1 View  Result Date:  01/15/2018 CLINICAL DATA:  Shortness of breath.  Acute mental status change. EXAM: PORTABLE CHEST 1 VIEW COMPARISON:  Jan 14, 2018 FINDINGS: No pneumothorax. Vascular crowding in the medial right lung base. No suspicious infiltrate. Mild atelectasis in the left base. The cardiomediastinal silhouette is stable. No other changes. IMPRESSION: Mild atelectasis in the left base.  No other significant change. Electronically Signed   By: Gerome Sam III M.D   On: 01/15/2018 07:40   Ir Angio Intra Extracran Sel Com Carotid Innominate Bilat Mod Sed  Result Date: 01/19/2018 CLINICAL DATA:  Focal right frontal hemorrhage. 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 EXAM: IR ANGIO VERTEBRAL SEL VERTEBRAL UNI RIGHT MOD SED; IR ULTRASOUND GUIDANCE VASC ACCESS RIGHT; IR ANGIO VERTEBRAL SEL SUBCLAVIAN INNOMINATE UNI LEFT MOD SED; BILATERAL COMMON CAROTID AND INNOMINATE ANGIOGRAPHY COMPARISON:  MRI MRA of the brain of 01/14/2018. MEDICATIONS: Heparin 0 units IV; no antibiotic was administered within 1 hour of the procedure. ANESTHESIA/SEDATION: Versed 0.5 mg IV; Fentanyl 37.5 mcg IV. Moderate Sedation Time:  75 minutes. The patient was continuously monitored during the procedure by the interventional radiology nurse under my  direct supervision. CONTRAST:  Isovue 300 approximately 60 mL. FLUOROSCOPY TIME:  Fluoroscopy Time: 8 minutes 42 seconds (822 mGy). COMPLICATIONS: None immediate. TECHNIQUE: Informed written consent was obtained from the patient after a thorough discussion of the procedural risks, benefits and alternatives. All questions were addressed. Maximal Sterile Barrier Technique was utilized including caps, mask, sterile gowns, sterile gloves, sterile drape, hand hygiene and skin antiseptic. A timeout was performed prior to the initiation of the procedure. The right antecubital fossa was prepped and draped in the usual sterile fashion. Thereafter using modified Seldinger technique,  transbrachial access into the right brachial artery was obtained without difficulty. Over a 0.035 inch guidewire, a 5 French Pinnacle sheath was inserted. Through this, and also over 0.035 inch guidewire, a 5 French Simmons 2 catheter was advanced to the aortic arch region and formed without difficulty. It was selectively positioned in the right common carotid artery, the right vertebral artery, the left common carotid artery and the left subclavian artery. FINDINGS: The right vertebral artery origin is widely patent. The vessel is seen to opacify normally to the cranial skull base. Wide patency is seen of the right vertebrobasilar junction and the right posterior-inferior cerebellar artery. The opacified portion of the basilar artery, the posterior cerebral arteries, the superior cerebellar arteries and the anterior-inferior cerebellar arteries appear normal into the capillary and venous phases. Non-opacified blood is seen in the basilar artery from contralateral vertebral artery. The left common carotid arteriogram demonstrates the left external carotid artery and its major branches to be widely patent. The left internal carotid artery at the bulb to the cranial skull base is widely patent. There is mild tortuosity in the proximal 1/3 of the left internal carotid artery. The petrous, cavernous and the supraclinoid segments are widely patent. The left middle cerebral artery and the left anterior cerebral artery opacify normally into the capillary and venous phases. The venous phase demonstrates retrograde opacification of the left transverse sinus in its proximal 2/3 primarily from the ipsilateral vein of Labbe. The right common carotid arteriogram demonstrates the right external carotid artery and its major branches to be widely patent. The right internal carotid artery at the bulb to the cranial skull base is widely patent. There is a double U shaped tortuosity of the middle 1/3 of the right internal carotid  artery without evidence of kinking. More distally, the right internal carotid artery is seen to opacify normally to the cranial skull base. The distal petrous, and the proximal cavernous segments demonstrate mild fusiform dilatation. Distal to this the supraclinoid right ICA is normally patent. The right middle cerebral artery and the right anterior cerebral artery demonstrate normal opacification into the capillary and venous phases. The right transverse sinus consistently demonstrates focal areas of moderate irregularity with questionable stenosis at its distal aspect. The left vertebral artery origin demonstrates mild stenosis. The vessel is seen to opacify normally to the cranial skull base. Wide patency is seen of the left vertebrobasilar junction and the left posterior-inferior cerebellar artery. The opacified portion of the distal left vertebrobasilar junction, the basilar artery, the posterior cerebral arteries, the superior cerebellar arteries and the anterior-cerebellar arteries is grossly patent into the delayed arterial phase. IMPRESSION: Angiographically no evidence of intracranial aneurysms, arteriovenous malformations, dural AV fistula, dissections, or of intraluminal filling defects. Venous outflow demonstrates areas of caliber irregularity involving the right transverse sinus with suspicion of stenosis. However, patient motion precludes fine detail in this region. PLAN: As per referring physician. Electronically Signed   By: Simonne Maffucci  Deveshwar M.D.   On: 01/18/2018 13:10   Ir Angio Vertebral Sel Subclavian Innominate Uni L Mod Sed  Result Date: 01/19/2018 CLINICAL DATA:  Focal right frontal hemorrhage. 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 EXAM: IR ANGIO VERTEBRAL SEL VERTEBRAL UNI RIGHT MOD SED; IR ULTRASOUND GUIDANCE VASC ACCESS RIGHT; IR ANGIO VERTEBRAL SEL SUBCLAVIAN INNOMINATE UNI LEFT MOD SED; BILATERAL COMMON CAROTID AND INNOMINATE ANGIOGRAPHY COMPARISON:  MRI  MRA of the brain of 01/14/2018. MEDICATIONS: Heparin 0 units IV; no antibiotic was administered within 1 hour of the procedure. ANESTHESIA/SEDATION: Versed 0.5 mg IV; Fentanyl 37.5 mcg IV. Moderate Sedation Time:  75 minutes. The patient was continuously monitored during the procedure by the interventional radiology nurse under my direct supervision. CONTRAST:  Isovue 300 approximately 60 mL. FLUOROSCOPY TIME:  Fluoroscopy Time: 8 minutes 42 seconds (822 mGy). COMPLICATIONS: None immediate. TECHNIQUE: Informed written consent was obtained from the patient after a thorough discussion of the procedural risks, benefits and alternatives. All questions were addressed. Maximal Sterile Barrier Technique was utilized including caps, mask, sterile gowns, sterile gloves, sterile drape, hand hygiene and skin antiseptic. A timeout was performed prior to the initiation of the procedure. The right antecubital fossa was prepped and draped in the usual sterile fashion. Thereafter using modified Seldinger technique, transbrachial access into the right brachial artery was obtained without difficulty. Over a 0.035 inch guidewire, a 5 French Pinnacle sheath was inserted. Through this, and also over 0.035 inch guidewire, a 5 French Simmons 2 catheter was advanced to the aortic arch region and formed without difficulty. It was selectively positioned in the right common carotid artery, the right vertebral artery, the left common carotid artery and the left subclavian artery. FINDINGS: The right vertebral artery origin is widely patent. The vessel is seen to opacify normally to the cranial skull base. Wide patency is seen of the right vertebrobasilar junction and the right posterior-inferior cerebellar artery. The opacified portion of the basilar artery, the posterior cerebral arteries, the superior cerebellar arteries and the anterior-inferior cerebellar arteries appear normal into the capillary and venous phases. Non-opacified blood is  seen in the basilar artery from contralateral vertebral artery. The left common carotid arteriogram demonstrates the left external carotid artery and its major branches to be widely patent. The left internal carotid artery at the bulb to the cranial skull base is widely patent. There is mild tortuosity in the proximal 1/3 of the left internal carotid artery. The petrous, cavernous and the supraclinoid segments are widely patent. The left middle cerebral artery and the left anterior cerebral artery opacify normally into the capillary and venous phases. The venous phase demonstrates retrograde opacification of the left transverse sinus in its proximal 2/3 primarily from the ipsilateral vein of Labbe. The right common carotid arteriogram demonstrates the right external carotid artery and its major branches to be widely patent. The right internal carotid artery at the bulb to the cranial skull base is widely patent. There is a double U shaped tortuosity of the middle 1/3 of the right internal carotid artery without evidence of kinking. More distally, the right internal carotid artery is seen to opacify normally to the cranial skull base. The distal petrous, and the proximal cavernous segments demonstrate mild fusiform dilatation. Distal to this the supraclinoid right ICA is normally patent. The right middle cerebral artery and the right anterior cerebral artery demonstrate normal opacification into the capillary and venous phases. The right transverse sinus consistently demonstrates focal areas of moderate irregularity with questionable  stenosis at its distal aspect. The left vertebral artery origin demonstrates mild stenosis. The vessel is seen to opacify normally to the cranial skull base. Wide patency is seen of the left vertebrobasilar junction and the left posterior-inferior cerebellar artery. The opacified portion of the distal left vertebrobasilar junction, the basilar artery, the posterior cerebral arteries, the  superior cerebellar arteries and the anterior-cerebellar arteries is grossly patent into the delayed arterial phase. IMPRESSION: Angiographically no evidence of intracranial aneurysms, arteriovenous malformations, dural AV fistula, dissections, or of intraluminal filling defects. Venous outflow demonstrates areas of caliber irregularity involving the right transverse sinus with suspicion of stenosis. However, patient motion precludes fine detail in this region. PLAN: As per referring physician. Electronically Signed   By: Julieanne Cotton M.D.   On: 01/18/2018 13:10   Ir Angio Vertebral Sel Vertebral Uni R Mod Sed  Result Date: 01/19/2018 CLINICAL DATA:  Focal right frontal hemorrhage. 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 EXAM: IR ANGIO VERTEBRAL SEL VERTEBRAL UNI RIGHT MOD SED; IR ULTRASOUND GUIDANCE VASC ACCESS RIGHT; IR ANGIO VERTEBRAL SEL SUBCLAVIAN INNOMINATE UNI LEFT MOD SED; BILATERAL COMMON CAROTID AND INNOMINATE ANGIOGRAPHY COMPARISON:  MRI MRA of the brain of 01/14/2018. MEDICATIONS: Heparin 0 units IV; no antibiotic was administered within 1 hour of the procedure. ANESTHESIA/SEDATION: Versed 0.5 mg IV; Fentanyl 37.5 mcg IV. Moderate Sedation Time:  75 minutes. The patient was continuously monitored during the procedure by the interventional radiology nurse under my direct supervision. CONTRAST:  Isovue 300 approximately 60 mL. FLUOROSCOPY TIME:  Fluoroscopy Time: 8 minutes 42 seconds (822 mGy). COMPLICATIONS: None immediate. TECHNIQUE: Informed written consent was obtained from the patient after a thorough discussion of the procedural risks, benefits and alternatives. All questions were addressed. Maximal Sterile Barrier Technique was utilized including caps, mask, sterile gowns, sterile gloves, sterile drape, hand hygiene and skin antiseptic. A timeout was performed prior to the initiation of the procedure. The right antecubital fossa was prepped and draped in the usual  sterile fashion. Thereafter using modified Seldinger technique, transbrachial access into the right brachial artery was obtained without difficulty. Over a 0.035 inch guidewire, a 5 French Pinnacle sheath was inserted. Through this, and also over 0.035 inch guidewire, a 5 French Simmons 2 catheter was advanced to the aortic arch region and formed without difficulty. It was selectively positioned in the right common carotid artery, the right vertebral artery, the left common carotid artery and the left subclavian artery. FINDINGS: The right vertebral artery origin is widely patent. The vessel is seen to opacify normally to the cranial skull base. Wide patency is seen of the right vertebrobasilar junction and the right posterior-inferior cerebellar artery. The opacified portion of the basilar artery, the posterior cerebral arteries, the superior cerebellar arteries and the anterior-inferior cerebellar arteries appear normal into the capillary and venous phases. Non-opacified blood is seen in the basilar artery from contralateral vertebral artery. The left common carotid arteriogram demonstrates the left external carotid artery and its major branches to be widely patent. The left internal carotid artery at the bulb to the cranial skull base is widely patent. There is mild tortuosity in the proximal 1/3 of the left internal carotid artery. The petrous, cavernous and the supraclinoid segments are widely patent. The left middle cerebral artery and the left anterior cerebral artery opacify normally into the capillary and venous phases. The venous phase demonstrates retrograde opacification of the left transverse sinus in its proximal 2/3 primarily from the ipsilateral vein of Labbe. The right  common carotid arteriogram demonstrates the right external carotid artery and its major branches to be widely patent. The right internal carotid artery at the bulb to the cranial skull base is widely patent. There is a double U shaped  tortuosity of the middle 1/3 of the right internal carotid artery without evidence of kinking. More distally, the right internal carotid artery is seen to opacify normally to the cranial skull base. The distal petrous, and the proximal cavernous segments demonstrate mild fusiform dilatation. Distal to this the supraclinoid right ICA is normally patent. The right middle cerebral artery and the right anterior cerebral artery demonstrate normal opacification into the capillary and venous phases. The right transverse sinus consistently demonstrates focal areas of moderate irregularity with questionable stenosis at its distal aspect. The left vertebral artery origin demonstrates mild stenosis. The vessel is seen to opacify normally to the cranial skull base. Wide patency is seen of the left vertebrobasilar junction and the left posterior-inferior cerebellar artery. The opacified portion of the distal left vertebrobasilar junction, the basilar artery, the posterior cerebral arteries, the superior cerebellar arteries and the anterior-cerebellar arteries is grossly patent into the delayed arterial phase. IMPRESSION: Angiographically no evidence of intracranial aneurysms, arteriovenous malformations, dural AV fistula, dissections, or of intraluminal filling defects. Venous outflow demonstrates areas of caliber irregularity involving the right transverse sinus with suspicion of stenosis. However, patient motion precludes fine detail in this region. PLAN: As per referring physician. Electronically Signed   By: Julieanne Cotton M.D.   On: 01/18/2018 13:10    Lab Data:  CBC: Recent Labs  Lab 01/31/18 0334 02/05/18 0411  WBC 6.5 5.6  HGB 9.1* 10.1*  HCT 31.0* 33.9*  MCV 89.6 90.4  PLT 422* 338   Basic Metabolic Panel: Recent Labs  Lab 01/31/18 0334 02/05/18 0411  NA 137 137  K 4.3 3.8  CL 101 104  CO2 27 28  GLUCOSE 89 111*  BUN 16 10  CREATININE 0.64 0.70  CALCIUM 9.0 9.0   GFR: Estimated  Creatinine Clearance: 108 mL/min (by C-G formula based on SCr of 0.7 mg/dL). Liver Function Tests: No results for input(s): AST, ALT, ALKPHOS, BILITOT, PROT, ALBUMIN in the last 168 hours. No results for input(s): LIPASE, AMYLASE in the last 168 hours. No results for input(s): AMMONIA in the last 168 hours. Coagulation Profile: No results for input(s): INR, PROTIME in the last 168 hours. Cardiac Enzymes: No results for input(s): CKTOTAL, CKMB, CKMBINDEX, TROPONINI in the last 168 hours. BNP (last 3 results) No results for input(s): PROBNP in the last 8760 hours. HbA1C: No results for input(s): HGBA1C in the last 72 hours. CBG: No results for input(s): GLUCAP in the last 168 hours. Lipid Profile: No results for input(s): CHOL, HDL, LDLCALC, TRIG, CHOLHDL, LDLDIRECT in the last 72 hours. Thyroid Function Tests: No results for input(s): TSH, T4TOTAL, FREET4, T3FREE, THYROIDAB in the last 72 hours. Anemia Panel: No results for input(s): VITAMINB12, FOLATE, FERRITIN, TIBC, IRON, RETICCTPCT in the last 72 hours. Urine analysis:    Component Value Date/Time   COLORURINE YELLOW 01/12/2018 2050   APPEARANCEUR CLEAR 01/12/2018 2050   LABSPEC 1.014 01/12/2018 2050   PHURINE 5.0 01/12/2018 2050   GLUCOSEU NEGATIVE 01/12/2018 2050   HGBUR NEGATIVE 01/12/2018 2050   BILIRUBINUR NEGATIVE 01/12/2018 2050   KETONESUR NEGATIVE 01/12/2018 2050   PROTEINUR NEGATIVE 01/12/2018 2050   NITRITE NEGATIVE 01/12/2018 2050   LEUKOCYTESUR NEGATIVE 01/12/2018 2050     Tula Schryver M.D. [facility] 02/06/2018, 12:03 PM  Pager: [phone] Between 7am to 7pm - call Pager - [phone]  After 7pm go to [email or web address] - password [email or web address]  Call night coverage person covering after 7pm

## 2018-02-06 NOTE — Plan of Care (Signed)
  Problem: Education: Goal: Knowledge of General Education information will improve Outcome: Progressing   Problem: Health Behavior/Discharge Planning: Goal: Ability to manage health-related needs will improve Outcome: Progressing   Problem: Clinical Measurements: Goal: Ability to maintain clinical measurements within normal limits will improve Outcome: Progressing Goal: Will remain free from infection Outcome: Progressing Goal: Diagnostic test results will improve Outcome: Progressing Goal: Respiratory complications will improve Outcome: Progressing Goal: Cardiovascular complication will be avoided Outcome: Progressing   Problem: Activity: Goal: Risk for activity intolerance will decrease Outcome: Progressing   Problem: Pain Managment: Goal: General experience of comfort will improve Outcome: Progressing   Problem: Skin Integrity: Goal: Risk for impaired skin integrity will decrease Outcome: Progressing   Problem: Education: Goal: Knowledge of disease or condition will improve Outcome: Progressing Goal: Knowledge of secondary prevention will improve Outcome: Progressing Goal: Knowledge of patient specific risk factors addressed and post discharge goals established will improve Outcome: Progressing   Problem: Coping: Goal: Will verbalize positive feelings about self Outcome: Progressing Goal: Will identify appropriate support needs Outcome: Progressing   Problem: Health Behavior/Discharge Planning: Goal: Ability to manage health-related needs will improve Outcome: Progressing   Problem: Self-Care: Goal: Ability to participate in self-care as condition permits will improve Outcome: Progressing Goal: Verbalization of feelings and concerns over difficulty with self-care will improve Outcome: Progressing Goal: Ability to communicate needs accurately will improve Outcome: Progressing   Problem: Nutrition: Goal: Risk of aspiration will decrease Outcome:  Progressing   Problem: Intracerebral Hemorrhage Tissue Perfusion: Goal: Complications of Intracerebral Hemorrhage will be minimized Outcome: Progressing   

## 2018-02-07 NOTE — Progress Notes (Signed)
[facility]                                                                              Patient Demographics  Marcus ReidDana [name], is a 42 y.o. male, DOB - 1976-01-31, XBJ:[id number]  Admit date - 01/14/2018   Admitting Physician Otho Najjarobert J Rosenblatt, MD  Outpatient Primary MD for the patient is [facility]  Outpatient specialists:   LOS - 24  days   Medical records reviewed and are as summarized below:    Chief Complaint  Patient presents with  . 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 The patient had a vegetation on the posterior leaflet of his mitral valve, and was found to have a subtotal occlusion of his aorta at the level of the IMA, as well as splenic and renal infarcts, treated with coumadin and antibiotics. [facility] with complaints of chest pain, back pain and found to be encephalopathic. 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 ID was consulted, patient placed on penicillin G. Blood cultures repeated at admission here remained negative. Coumadin was stopped due to ICH. TTE showed a 1.4 cm mobile vegetation on the posterior leaflet on the MV with severe regurgitation, though he's had no evidence of heart failure. Vascular srugery also consulted for distal aortoiliac occlusion, felt the patient could not undergo repair as there are no non-prosthetic options and this is contraindicated due to infection. Noted that distal pulses with monophasic with intact motor and sensation to both feet suggesting that these were chronic findings. 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 Palliative care was recommended and has followed with the patient, though he seems to have very few social supports.  Plan is to continue IV antibiotics x6 weeks through 6/11, then indefinite oral amoxicillin.    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  ID was consulted  -ID recommended IV penicillin for 6 weeks till 6/11, then indefinite amoxicillin. 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 -Palliative consulted, DNR  Polysubstance abuse, opioid use disorder Continue Suboxone, Dr. Oswaldo Done has seen the patient   Depression Continue Wellbutrin  Tobacco use Continue nicotine gum  Chronic hepatitis B and C -Outpatient follow-up with ID   MCA CVA/ICH, M2 occlusion -Cerebral angiogram showed no cerebral aneurysm, no further work-up per neurology, follow-up outpatient in 4 weeks with stroke clinic  Thrombocytosis, leukocytosis Improving, within normal limits  Anemia of chronic disease - transfuse if below 7.5 -H&H  currently stable  Moderate malnutrition Continue nutritional supplements  Sacral wound, unstageable -Wound care consulted, recommended frequent turning, repositioning to the patient  Code Status: DNR DVT Prophylaxis:   SCD's Family Communication: Discussed in detail with the patient, all imaging results, lab results explained to the patient    Disposition Plan: No acute issues or complaints.  Continue antibiotics till 6/11. Time Spent in minutes  15 minutes  Procedures:  TTE on 5/16  Consultants:   Cardiology CT VS Vascular surgery ID Palliative care  Antimicrobials:   Penicillin G   Medications  Scheduled Meds: . buprenorphine-naloxone  1 tablet Sublingual Daily  . buPROPion  150 mg Oral Daily  . famotidine  20 mg Oral BID  . feeding supplement (ENSURE ENLIVE)  237 mL Oral TID PC & HS  . polyethylene glycol  17 g Oral Daily  . senna-docusate  1 tablet Oral QHS   Continuous Infusions: . pencillin G potassium IV 4 Million Units (02/07/18 0845)   PRN Meds:.acetaminophen, bisacodyl, nicotine polacrilex, ondansetron (ZOFRAN) IV, RESOURCE THICKENUP CLEAR   Antibiotics   Anti-infectives (From admission, onward)   Start     Dose/Rate Route Frequency Ordered Stop   01/19/18 1600  penicillin G potassium 4 Million Units in dextrose 5 % 250 mL IVPB     4 Million Units 250 mL/hr over 60 Minutes Intravenous Every 4 hours 01/19/18 1424 02/09/18 2359   01/18/18 2200  vancomycin (VANCOCIN) IVPB 750 mg/150 ml premix  Status:  Discontinued     750 mg 150 mL/hr over 60 Minutes Intravenous Every 8 hours 01/18/18 1431 01/19/18 1424   01/14/18 2000  vancomycin (VANCOCIN) IVPB 1000 mg/200 mL premix  Status:  Discontinued     1,000 mg 200 mL/hr over 60 Minutes Intravenous Every 8 hours 01/14/18 1103 01/18/18 1431   01/14/18 2000  piperacillin-tazobactam (ZOSYN) IVPB 3.375 g  Status:  Discontinued     3.375 g 12.5 mL/hr over 240 Minutes Intravenous Every 8 hours 01/14/18 1103  01/16/18 1211   01/14/18 1100  vancomycin (VANCOCIN) 1,250 mg in sodium chloride 0.9 % 250 mL IVPB     1,250 mg 166.7 mL/hr over 90 Minutes Intravenous  Once 01/14/18 1036 01/14/18 1631   01/14/18 1045  piperacillin-tazobactam (ZOSYN) IVPB 3.375 g     3.375 g 100 mL/hr over 30 Minutes Intravenous  Once 01/14/18 1036 01/14/18 1228        Subjective:   Marcus [name] was seen and examined today.  Denies any specific complaints, no fevers or chills.  No acute issues overnight.  Objective:   Vitals:   02/06/18 2016 02/07/18 0400 02/07/18 0410 02/07/18 0800  BP: 100/60 (!) 98/58  (!) 90/55  Pulse: 74 86    Resp: 18 20    Temp: 98.4 F (36.9 C) 98.6 F (37 C)    TempSrc: Oral Oral    SpO2: 99% 96%    Weight:   62.3 kg (137 lb 6.4 oz)   Height:  Intake/Output Summary (Last 24 hours) at 02/07/2018 1121 Last data filed at 02/07/2018 0300 Gross per 24 hour  Intake 730 ml  Output 700 ml  Net 30 ml     Wt Readings from Last 3 Encounters:  02/07/18 62.3 kg (137 lb 6.4 oz)  01/14/18 63.5 kg (140 lb)  01/13/18 63.5 kg (140 lb)     Exam   General: Alert and oriented x 3, NAD  Eyes:  HEENT:    Cardiovascular: S1 S2 auscultated, 3/6 systolic murmur, RRR No pedal edema b/l  Respiratory: Clear to auscultation bilaterally, no wheezing, rales or rhonchi  Gastrointestinal: Soft, nontender, nondistended, + bowel sounds  Ext: no pedal edema bilaterally  Neuro: no new deficits  Musculoskeletal: No digital cyanosis, clubbing  Skin: No rashes  Psych: Normal affect and demeanor, alert and oriented x3    Data Reviewed:  I have personally reviewed following labs and imaging studies  Micro Results No results found for this or any previous visit (from the past 240 hour(s)).  Radiology Reports Dg Chest 2 View  Result Date: 01/14/2018 CLINICAL DATA:  Left chest pain EXAM: CHEST - 2 VIEW COMPARISON:  01/13/2018 FINDINGS: Heart and mediastinal contours are within normal  limits. No focal opacities or effusions. No acute bony abnormality. IMPRESSION: No active cardiopulmonary disease. Electronically Signed   By: Charlett Nose M.D.   On: 01/14/2018 08:29   Dg Chest 2 View  Result Date: 01/13/2018 CLINICAL DATA:  Weakness, hungry, pain.  History of hepatitis-C. EXAM: CHEST - 2 VIEW COMPARISON:  None. FINDINGS: The heart size and mediastinal contours are within normal limits. Both lungs are clear. The visualized skeletal structures are unremarkable. IMPRESSION: Negative. Electronically Signed   By: Awilda Metro M.D.   On: 01/13/2018 19:46   Ct Head Wo Contrast  Result Date: 01/24/2018 CLINICAL DATA:  Continued surveillance infarcts and hemorrhage. EXAM: CT HEAD WITHOUT CONTRAST TECHNIQUE: Contiguous axial images were obtained from the base of the skull through the vertex without intravenous contrast. COMPARISON:  CT head 01/14/2018. MR head 01/14/2018. CT angiogram 01/18/2018. FINDINGS: Brain: Previously identified subacute infarcts are involving into areas of chronic ischemia affecting the BILATERAL RIGHT greater than LEFT subcortical white matter. Tiny focus of hyperattenuation, RIGHT frontal cortex, image 23 appears improved. This measures no more than 2 x 3 mm on today's exam. Vascular: Improved appearance of the previously identified RIGHT M2 occlusion. Some hyperattenuation of vessel wall in the sylvian fissure, see series 3, image 12. Skull: Normal. Negative for fracture or focal lesion. Sinuses/Orbits: No acute finding. Other: None. IMPRESSION: Previously identified subacute white matter infarcts are have evolved into areas of chronic ischemia. Tiny focus of hyperattenuation RIGHT frontal cortex is improved. Improved appearance of the previously identified RIGHT M2 occlusion. Electronically Signed   By: Elsie Stain M.D.   On: 01/24/2018 17:38   Ct Head Wo Contrast  Addendum Date: 01/14/2018   ADDENDUM REPORT: 01/14/2018 10:35 ADDENDUM: Study discussed by  telephone with Dr. Shaune Pollack on 01/14/2018 at 1015 hours. 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 THEREFORE, the appearance is highly likely to reflect SEPTIC EMBOLI, with acute embolic related hemorrhage in the right superior frontal lobe. Electronically Signed   By: Odessa Fleming M.D.   On: 01/14/2018 10:35   Result Date: 01/14/2018 CLINICAL DATA:  43 year old male with altered level consciousness, lightheadedness and dizzy onset about 1 hour ago. EXAM: CT HEAD WITHOUT CONTRAST TECHNIQUE: Contiguous axial images were obtained from the base of the skull  through the vertex without intravenous contrast. COMPARISON:  None. FINDINGS: Brain: Cytotoxic edema at the right anterior insula and frontal operculum. No associated hemorrhage or mass effect in those segments. Small amorphous 7 millimeter focus of hyperdensity at the right superior frontal gyrus on series 3, image 25 and sagittal image 26. On coronal images there is adjacent hypodensity (coronal image 34). No regional mass effect. No other cytotoxic edema identified. ASPECTS 7 (abnormal insula, M4, M 5). More chronic appearing left corona radiata white matter hypodensity on the left. No intracranial mass effect or midline shift. No ventriculomegaly. Normal basilar cisterns. Vascular: Abnormal cylindrical hyperdensity at the right MCA proximal M2 segment with Vessel expansion (series 3, image 14 and series 6, image 21). Skull: No acute osseous abnormality identified. Sinuses/Orbits: Visualized paranasal sinuses and mastoids are well pneumatized. Other: Mildly Disconjugate gaze, otherwise negative orbits soft tissues. No acute scalp soft tissue findings, scattered benign appearing scalp dermal nodularity. IMPRESSION: 1. 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 2. Possible trace focus of acute hemorrhage in the right superior frontal gyrus on series 3, image 25. No other acute intracranial hemorrhage identified. 3. Right MCA  territory cytotoxic edema, ASPECTS 7 (although note suspicion of trace acute hemorrhage in #2). 4. Chronic appearing left MCA white matter disease. Electronically Signed: By: Odessa Fleming M.D. On: 01/14/2018 10:18   Ct Angio Abdomen W &/or Wo Contrast  Result Date: 01/21/2018 CLINICAL DATA:  Aorta occlusion EXAM: CT ANGIOGRAPHY ABDOMEN TECHNIQUE: Multidetector CT imaging of the abdomen was performed using the standard protocol during bolus administration of intravenous contrast. Multiplanar reconstructed images and MIPs were obtained and reviewed to evaluate the vascular anatomy. CONTRAST:  ISOVUE-370 IOPAMIDOL (ISOVUE-370) INJECTION 76% COMPARISON:  12/29/2017 at [facility], report only FINDINGS: VASCULAR Aorta: The aorta is abruptly occluded just below the takeoff of the IMA. The borders of the abdominal aorta are poorly defined at the level of the occlusion. Mild aneurysmal dilatation of the distal aorta measuring up to 3.7 cm is suspected. At the level of the occlusion, there is mixed low-density and isodensity within the thrombus, as well as a lack of atherosclerotic calcification, suggesting subacute rather than acute age. There is also a mild thickening of the wall of the aorta with stranding in the adjacent fat. These findings suggest an inflammatory process. Celiac: Patent and ectatic. There is abrupt occlusion of the splenic artery, 2 cm from the hilum of the spleen. Distal branches reconstitute. The hepatic artery and its branches are grossly patent. Gastroduodenal artery is patent. SMA: Patent and ectatic. Renals: 2 right renal arteries and a single left renal artery are patent. IMA: Patent. Inflow: Bilateral common iliac arteries are occluded. There is some filling of the mid and distal left common iliac artery with contrast flow. The left common iliac artery measures 1.9 cm in maximal caliber. Right common iliac artery measures 3.6 cm in caliber. The left internal and external  iliac arteries reconstitute and are partially imaged. Veins: No evidence of DVT. Review of the MIP images confirms the above findings. NON-VASCULAR Lower chest: No acute abnormality. Hepatobiliary: Unremarkable Pancreas: Unremarkable Spleen: There is a large nonspecific there is a complex cystic lesion in the spleen measuring up to 11.6 cm. Adrenals/Urinary Tract: There is scarring in the lower pole of the left kidney associated with wedge-shaped areas of low density. These findings likely represent small infarcts. This is also present in the upper pole of the right kidney to a lesser  degree. Stomach/Bowel: No obvious mass in the colon. No evidence of small-bowel obstruction. Lymphatic: Small para-aortic lymph nodes are present. Other: No free fluid.  Postoperative changes from appendectomy. Musculoskeletal: No vertebral compression deformity. Bilateral L5 pars defects. No evidence of anterolisthesis. IMPRESSION: VASCULAR There is abrupt occlusion of the aorta below the level of the IMA as described on the prior report. Mixed density within the aorta suggest subacute age. Also, there is thickening and inflammatory change of the wall of the aorta suggesting vasculitis or mycotic aneurysm. There is also associated occlusion of the bilateral common iliac arteries. Focal occlusion of the splenic artery. NON-VASCULAR Complex cystic lesion in the spleen is nonspecific. Small infarcts in scarring in the kidneys bilaterally. Electronically Signed   By: Jolaine Click M.D.   On: 01/21/2018 12:05   Mr Maxine Glenn Head Wo Contrast  Result Date: 01/14/2018 CLINICAL DATA:  In hospital for sepsis, altered mental status. History of hepatitis-C, polysubstance abuse and endocarditis. History of head injury 1 month ago. Follow-up RIGHT MCA stroke. EXAM: MRI HEAD WITHOUT CONTRAST MRA HEAD WITHOUT CONTRAST TECHNIQUE: Multiplanar, multiecho pulse sequences of the brain and surrounding structures were obtained without intravenous contrast.  Angiographic images of the head were obtained using MRA technique without contrast. COMPARISON:  CT HEAD Jan 14, 2018 at 0951 hours. FINDINGS: MRI HEAD FINDINGS Multiple sequences are mildly or moderately motion degraded. INTRACRANIAL CONTENTS: Patchy reduced diffusion RIGHT frontal lobe and RIGHT basal ganglia, LEFT posterior frontal lobe with normalized ADC values and focal T2 shine through. Associated FLAIR T2 hyperintense signal and faint hemosiderin staining. Slightly expanded FLAIR T2 hyperintense RIGHT basal ganglia. Superimposed focal susceptibility artifact bifrontal lobes, RIGHT parietooccipital lobe. Multiple additional scattered micro hemorrhages. Mild parenchymal brain volume loss. No hydrocephalus. Old small RIGHT cerebellar infarcts. VASCULAR: Susceptibility artifact and T1 shortening RIGHT sylvian fissure corresponding to MCA density. SKULL AND UPPER CERVICAL SPINE: No abnormal sellar expansion. No suspicious calvarial bone marrow signal. Craniocervical junction maintained. SINUSES/ORBITS: The mastoid air-cells and included paranasal sinuses are well-aerated.The included ocular globes and orbital contents are non-suspicious. OTHER: None. MRA HEAD FINDINGS ANTERIOR CIRCULATION: Normal flow related enhancement of the included cervical, petrous, cavernous and supraclinoid internal carotid arteries. Patent anterior communicating artery. Mildly attenuated RIGHT MCA flow related enhancement, occluded RIGHT M2 segment with transient aneurysmal reconstitution. No flow limiting stenosis. POSTERIOR CIRCULATION: vertebral artery is dominant. Basilar artery is patent, with normal flow related enhancement of the main branch vessels. Patent posterior cerebral arteries. Robust RIGHT and smaller LEFT posterior communicating arteries present. No large vessel occlusion, flow limiting stenosis,  aneurysm. ANATOMIC VARIANTS: None. Source images and MIP images were reviewed. IMPRESSION: MRI HEAD: 1. Multifocal subacute  bilateral MCA/watershed territory infarcts. 2. Peripheral susceptibility artifact, septic emboli suspected. There may be superimposed shear injury or underlying chronic hypertension. 3. Mild parenchymal brain volume loss for age. 4. Old small RIGHT cerebellar infarcts. MRA HEAD: 1. 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 2. Otherwise negative MRA head. Examination reviewed by Dr. Augusto Gamble with Dr. Wilford Corner, Neurology on Jan 14, 2018 at approximately 1425 hours. Electronically Signed   By: Awilda Metro M.D.   On: 01/14/2018 14:38   Mr Brain Wo Contrast  Result Date: 01/14/2018 CLINICAL DATA:  In hospital for sepsis, altered mental status. History of hepatitis-C, polysubstance abuse and endocarditis. History of head injury 1 month ago. Follow-up RIGHT MCA stroke. EXAM: MRI HEAD WITHOUT CONTRAST MRA HEAD WITHOUT CONTRAST TECHNIQUE: Multiplanar, multiecho pulse  sequences of the brain and surrounding structures were obtained without intravenous contrast. Angiographic images of the head were obtained using MRA technique without contrast. COMPARISON:  CT HEAD Jan 14, 2018 at 0951 hours. FINDINGS: MRI HEAD FINDINGS Multiple sequences are mildly or moderately motion degraded. INTRACRANIAL CONTENTS: Patchy reduced diffusion RIGHT frontal lobe and RIGHT basal ganglia, LEFT posterior frontal lobe with normalized ADC values and focal T2 shine through. Associated FLAIR T2 hyperintense signal and faint hemosiderin staining. Slightly expanded FLAIR T2 hyperintense RIGHT basal ganglia. Superimposed focal susceptibility artifact bifrontal lobes, RIGHT parietooccipital lobe. Multiple additional scattered micro hemorrhages. Mild parenchymal brain volume loss. No hydrocephalus. Old small RIGHT cerebellar infarcts. VASCULAR: Susceptibility artifact and T1 shortening RIGHT sylvian fissure corresponding to MCA density. SKULL AND UPPER  CERVICAL SPINE: No abnormal sellar expansion. No suspicious calvarial bone marrow signal. Craniocervical junction maintained. SINUSES/ORBITS: The mastoid air-cells and included paranasal sinuses are well-aerated.The included ocular globes and orbital contents are non-suspicious. OTHER: None. MRA HEAD FINDINGS ANTERIOR CIRCULATION: Normal flow related enhancement of the included cervical, petrous, cavernous and supraclinoid internal carotid arteries. Patent anterior communicating artery. Mildly attenuated RIGHT MCA flow related enhancement, occluded RIGHT M2 segment with transient aneurysmal reconstitution. No flow limiting stenosis. POSTERIOR CIRCULATION: vertebral artery is dominant. Basilar artery is patent, with normal flow related enhancement of the main branch vessels. Patent posterior cerebral arteries. Robust RIGHT and smaller LEFT posterior communicating arteries present. No large vessel occlusion, flow limiting stenosis,  aneurysm. ANATOMIC VARIANTS: None. Source images and MIP images were reviewed. IMPRESSION: MRI HEAD: 1. Multifocal subacute bilateral MCA/watershed territory infarcts. 2. Peripheral susceptibility artifact, septic emboli suspected. There may be superimposed shear injury or underlying chronic hypertension. 3. Mild parenchymal brain volume loss for age. 4. Old small RIGHT cerebellar infarcts. MRA HEAD: 1. 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 2. Otherwise negative MRA head. Examination reviewed by Dr. Augusto Gamble with Dr. Wilford Corner, Neurology on Jan 14, 2018 at approximately 1425 hours. Electronically Signed   By: Awilda Metro M.D.   On: 01/14/2018 14:38   Dg Chest Port 1 View  Result Date: 01/15/2018 CLINICAL DATA:  Shortness of breath.  Acute mental status change. EXAM: PORTABLE CHEST 1 VIEW COMPARISON:  Jan 14, 2018 FINDINGS: No pneumothorax. Vascular crowding in the medial right lung  base. No suspicious infiltrate. Mild atelectasis in the left base. The cardiomediastinal silhouette is stable. No other changes. IMPRESSION: Mild atelectasis in the left base.  No other significant change. Electronically Signed   By: Gerome Sam III M.D   On: 01/15/2018 07:40   Ir Angio Intra Extracran Sel Com Carotid Innominate Bilat Mod Sed  Result Date: 01/19/2018 CLINICAL DATA:  Focal right frontal hemorrhage. 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 EXAM: IR ANGIO VERTEBRAL SEL VERTEBRAL UNI RIGHT MOD SED; IR ULTRASOUND GUIDANCE VASC ACCESS RIGHT; IR ANGIO VERTEBRAL SEL SUBCLAVIAN INNOMINATE UNI LEFT MOD SED; BILATERAL COMMON CAROTID AND INNOMINATE ANGIOGRAPHY COMPARISON:  MRI MRA of the brain of 01/14/2018. MEDICATIONS: Heparin 0 units IV; no antibiotic was administered within 1 hour of the procedure. ANESTHESIA/SEDATION: Versed 0.5 mg IV; Fentanyl 37.5 mcg IV. Moderate Sedation Time:  75 minutes. The patient was continuously monitored during the procedure by the interventional radiology nurse under my direct supervision. CONTRAST:  Isovue 300 approximately 60 mL. FLUOROSCOPY TIME:  Fluoroscopy Time: 8 minutes 42 seconds (822 mGy). COMPLICATIONS: None immediate. TECHNIQUE: Informed written consent was obtained from the patient after  a thorough discussion of the procedural risks, benefits and alternatives. All questions were addressed. Maximal Sterile Barrier Technique was utilized including caps, mask, sterile gowns, sterile gloves, sterile drape, hand hygiene and skin antiseptic. A timeout was performed prior to the initiation of the procedure. The right antecubital fossa was prepped and draped in the usual sterile fashion. Thereafter using modified Seldinger technique, transbrachial access into the right brachial artery was obtained without difficulty. Over a 0.035 inch guidewire, a 5 French Pinnacle sheath was inserted. Through this, and also over 0.035 inch guidewire, a 5  French Simmons 2 catheter was advanced to the aortic arch region and formed without difficulty. It was selectively positioned in the right common carotid artery, the right vertebral artery, the left common carotid artery and the left subclavian artery. FINDINGS: The right vertebral artery origin is widely patent. The vessel is seen to opacify normally to the cranial skull base. Wide patency is seen of the right vertebrobasilar junction and the right posterior-inferior cerebellar artery. The opacified portion of the basilar artery, the posterior cerebral arteries, the superior cerebellar arteries and the anterior-inferior cerebellar arteries appear normal into the capillary and venous phases. Non-opacified blood is seen in the basilar artery from contralateral vertebral artery. The left common carotid arteriogram demonstrates the left external carotid artery and its major branches to be widely patent. The left internal carotid artery at the bulb to the cranial skull base is widely patent. There is mild tortuosity in the proximal 1/3 of the left internal carotid artery. The petrous, cavernous and the supraclinoid segments are widely patent. The left middle cerebral artery and the left anterior cerebral artery opacify normally into the capillary and venous phases. The venous phase demonstrates retrograde opacification of the left transverse sinus in its proximal 2/3 primarily from the ipsilateral vein of Labbe. The right common carotid arteriogram demonstrates the right external carotid artery and its major branches to be widely patent. The right internal carotid artery at the bulb to the cranial skull base is widely patent. There is a double U shaped tortuosity of the middle 1/3 of the right internal carotid artery without evidence of kinking. More distally, the right internal carotid artery is seen to opacify normally to the cranial skull base. The distal petrous, and the proximal cavernous segments demonstrate mild  fusiform dilatation. Distal to this the supraclinoid right ICA is normally patent. The right middle cerebral artery and the right anterior cerebral artery demonstrate normal opacification into the capillary and venous phases. The right transverse sinus consistently demonstrates focal areas of moderate irregularity with questionable stenosis at its distal aspect. The left vertebral artery origin demonstrates mild stenosis. The vessel is seen to opacify normally to the cranial skull base. Wide patency is seen of the left vertebrobasilar junction and the left posterior-inferior cerebellar artery. The opacified portion of the distal left vertebrobasilar junction, the basilar artery, the posterior cerebral arteries, the superior cerebellar arteries and the anterior-cerebellar arteries is grossly patent into the delayed arterial phase. IMPRESSION: Angiographically no evidence of intracranial aneurysms, arteriovenous malformations, dural AV fistula, dissections, or of intraluminal filling defects. Venous outflow demonstrates areas of caliber irregularity involving the right transverse sinus with suspicion of stenosis. However, patient motion precludes fine detail in this region. PLAN: As per referring physician. Electronically Signed   By: Julieanne Cotton M.D.   On: 01/18/2018 13:10   Ir Angio Vertebral Sel Subclavian Innominate Uni L Mod Sed  Result Date: 01/19/2018 CLINICAL DATA:  Focal right frontal hemorrhage. 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 EXAM: IR ANGIO VERTEBRAL SEL VERTEBRAL UNI RIGHT MOD SED; IR ULTRASOUND GUIDANCE VASC ACCESS RIGHT; IR ANGIO VERTEBRAL SEL SUBCLAVIAN INNOMINATE UNI LEFT MOD SED; BILATERAL COMMON CAROTID AND INNOMINATE ANGIOGRAPHY COMPARISON:  MRI MRA of the brain of 01/14/2018. MEDICATIONS: Heparin 0 units IV; no antibiotic was administered within 1 hour of the procedure. ANESTHESIA/SEDATION: Versed 0.5 mg IV; Fentanyl 37.5 mcg IV. Moderate Sedation Time:   75 minutes. The patient was continuously monitored during the procedure by the interventional radiology nurse under my direct supervision. CONTRAST:  Isovue 300 approximately 60 mL. FLUOROSCOPY TIME:  Fluoroscopy Time: 8 minutes 42 seconds (822 mGy). COMPLICATIONS: None immediate. TECHNIQUE: Informed written consent was obtained from the patient after a thorough discussion of the procedural risks, benefits and alternatives. All questions were addressed. Maximal Sterile Barrier Technique was utilized including caps, mask, sterile gowns, sterile gloves, sterile drape, hand hygiene and skin antiseptic. A timeout was performed prior to the initiation of the procedure. The right antecubital fossa was prepped and draped in the usual sterile fashion. Thereafter using modified Seldinger technique, transbrachial access into the right brachial artery was obtained without difficulty. Over a 0.035 inch guidewire, a 5 French Pinnacle sheath was inserted. Through this, and also over 0.035 inch guidewire, a 5 French Simmons 2 catheter was advanced to the aortic arch region and formed without difficulty. It was selectively positioned in the right common carotid artery, the right vertebral artery, the left common carotid artery and the left subclavian artery. FINDINGS: The right vertebral artery origin is widely patent. The vessel is seen to opacify normally to the cranial skull base. Wide patency is seen of the right vertebrobasilar junction and the right posterior-inferior cerebellar artery. The opacified portion of the basilar artery, the posterior cerebral arteries, the superior cerebellar arteries and the anterior-inferior cerebellar arteries appear normal into the capillary and venous phases. Non-opacified blood is seen in the basilar artery from contralateral vertebral artery. The left common carotid arteriogram demonstrates the left external carotid artery and its major branches to be widely patent. The left internal carotid  artery at the bulb to the cranial skull base is widely patent. There is mild tortuosity in the proximal 1/3 of the left internal carotid artery. The petrous, cavernous and the supraclinoid segments are widely patent. The left middle cerebral artery and the left anterior cerebral artery opacify normally into the capillary and venous phases. The venous phase demonstrates retrograde opacification of the left transverse sinus in its proximal 2/3 primarily from the ipsilateral vein of Labbe. The right common carotid arteriogram demonstrates the right external carotid artery and its major branches to be widely patent. The right internal carotid artery at the bulb to the cranial skull base is widely patent. There is a double U shaped tortuosity of the middle 1/3 of the right internal carotid artery without evidence of kinking. More distally, the right internal carotid artery is seen to opacify normally to the cranial skull base. The distal petrous, and the proximal cavernous segments demonstrate mild fusiform dilatation. Distal to this the supraclinoid right ICA is normally patent. The right middle cerebral artery and the right anterior cerebral artery demonstrate normal opacification into the capillary and venous phases. The right transverse sinus consistently demonstrates focal areas of moderate irregularity with questionable stenosis at its distal aspect. The left vertebral artery origin demonstrates mild stenosis. The vessel is seen to opacify normally to the cranial skull base. Wide patency is seen of the left  vertebrobasilar junction and the left posterior-inferior cerebellar artery. The opacified portion of the distal left vertebrobasilar junction, the basilar artery, the posterior cerebral arteries, the superior cerebellar arteries and the anterior-cerebellar arteries is grossly patent into the delayed arterial phase. IMPRESSION: Angiographically no evidence of intracranial aneurysms, arteriovenous malformations,  dural AV fistula, dissections, or of intraluminal filling defects. Venous outflow demonstrates areas of caliber irregularity involving the right transverse sinus with suspicion of stenosis. However, patient motion precludes fine detail in this region. PLAN: As per referring physician. Electronically Signed   By: Julieanne Cotton M.D.   On: 01/18/2018 13:10   Ir Angio Vertebral Sel Vertebral Uni R Mod Sed  Result Date: 01/19/2018 CLINICAL DATA:  Focal right frontal hemorrhage. 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 EXAM: IR ANGIO VERTEBRAL SEL VERTEBRAL UNI RIGHT MOD SED; IR ULTRASOUND GUIDANCE VASC ACCESS RIGHT; IR ANGIO VERTEBRAL SEL SUBCLAVIAN INNOMINATE UNI LEFT MOD SED; BILATERAL COMMON CAROTID AND INNOMINATE ANGIOGRAPHY COMPARISON:  MRI MRA of the brain of 01/14/2018. MEDICATIONS: Heparin 0 units IV; no antibiotic was administered within 1 hour of the procedure. ANESTHESIA/SEDATION: Versed 0.5 mg IV; Fentanyl 37.5 mcg IV. Moderate Sedation Time:  75 minutes. The patient was continuously monitored during the procedure by the interventional radiology nurse under my direct supervision. CONTRAST:  Isovue 300 approximately 60 mL. FLUOROSCOPY TIME:  Fluoroscopy Time: 8 minutes 42 seconds (822 mGy). COMPLICATIONS: None immediate. TECHNIQUE: Informed written consent was obtained from the patient after a thorough discussion of the procedural risks, benefits and alternatives. All questions were addressed. Maximal Sterile Barrier Technique was utilized including caps, mask, sterile gowns, sterile gloves, sterile drape, hand hygiene and skin antiseptic. A timeout was performed prior to the initiation of the procedure. The right antecubital fossa was prepped and draped in the usual sterile fashion. Thereafter using modified Seldinger technique, transbrachial access into the right brachial artery was obtained without difficulty. Over a 0.035 inch guidewire, a 5 French Pinnacle sheath was  inserted. Through this, and also over 0.035 inch guidewire, a 5 French Simmons 2 catheter was advanced to the aortic arch region and formed without difficulty. It was selectively positioned in the right common carotid artery, the right vertebral artery, the left common carotid artery and the left subclavian artery. FINDINGS: The right vertebral artery origin is widely patent. The vessel is seen to opacify normally to the cranial skull base. Wide patency is seen of the right vertebrobasilar junction and the right posterior-inferior cerebellar artery. The opacified portion of the basilar artery, the posterior cerebral arteries, the superior cerebellar arteries and the anterior-inferior cerebellar arteries appear normal into the capillary and venous phases. Non-opacified blood is seen in the basilar artery from contralateral vertebral artery. The left common carotid arteriogram demonstrates the left external carotid artery and its major branches to be widely patent. The left internal carotid artery at the bulb to the cranial skull base is widely patent. There is mild tortuosity in the proximal 1/3 of the left internal carotid artery. The petrous, cavernous and the supraclinoid segments are widely patent. The left middle cerebral artery and the left anterior cerebral artery opacify normally into the capillary and venous phases. The venous phase demonstrates retrograde opacification of the left transverse sinus in its proximal 2/3 primarily from the ipsilateral vein of Labbe. The right common carotid arteriogram demonstrates the right external carotid artery and its major branches to be widely patent. The right internal carotid artery at the bulb to the cranial skull base is widely  patent. There is a double U shaped tortuosity of the middle 1/3 of the right internal carotid artery without evidence of kinking. More distally, the right internal carotid artery is seen to opacify normally to the cranial skull base. The  distal petrous, and the proximal cavernous segments demonstrate mild fusiform dilatation. Distal to this the supraclinoid right ICA is normally patent. The right middle cerebral artery and the right anterior cerebral artery demonstrate normal opacification into the capillary and venous phases. The right transverse sinus consistently demonstrates focal areas of moderate irregularity with questionable stenosis at its distal aspect. The left vertebral artery origin demonstrates mild stenosis. The vessel is seen to opacify normally to the cranial skull base. Wide patency is seen of the left vertebrobasilar junction and the left posterior-inferior cerebellar artery. The opacified portion of the distal left vertebrobasilar junction, the basilar artery, the posterior cerebral arteries, the superior cerebellar arteries and the anterior-cerebellar arteries is grossly patent into the delayed arterial phase. IMPRESSION: Angiographically no evidence of intracranial aneurysms, arteriovenous malformations, dural AV fistula, dissections, or of intraluminal filling defects. Venous outflow demonstrates areas of caliber irregularity involving the right transverse sinus with suspicion of stenosis. However, patient motion precludes fine detail in this region. PLAN: As per referring physician. Electronically Signed   By: Julieanne Cotton M.D.   On: 01/18/2018 13:10    Lab Data:  CBC: Recent Labs  Lab 02/05/18 0411  WBC 5.6  HGB 10.1*  HCT 33.9*  MCV 90.4  PLT 338   Basic Metabolic Panel: Recent Labs  Lab 02/05/18 0411  NA 137  K 3.8  CL 104  CO2 28  GLUCOSE 111*  BUN 10  CREATININE 0.70  CALCIUM 9.0   GFR: Estimated Creatinine Clearance: 108.2 mL/min (by C-G formula based on SCr of 0.7 mg/dL). Liver Function Tests: No results for input(s): AST, ALT, ALKPHOS, BILITOT, PROT, ALBUMIN in the last 168 hours. No results for input(s): LIPASE, AMYLASE in the last 168 hours. No results for input(s): AMMONIA in  the last 168 hours. Coagulation Profile: No results for input(s): INR, PROTIME in the last 168 hours. Cardiac Enzymes: No results for input(s): CKTOTAL, CKMB, CKMBINDEX, TROPONINI in the last 168 hours. BNP (last 3 results) No results for input(s): PROBNP in the last 8760 hours. HbA1C: No results for input(s): HGBA1C in the last 72 hours. CBG: No results for input(s): GLUCAP in the last 168 hours. Lipid Profile: No results for input(s): CHOL, HDL, LDLCALC, TRIG, CHOLHDL, LDLDIRECT in the last 72 hours. Thyroid Function Tests: No results for input(s): TSH, T4TOTAL, FREET4, T3FREE, THYROIDAB in the last 72 hours. Anemia Panel: No results for input(s): VITAMINB12, FOLATE, FERRITIN, TIBC, IRON, RETICCTPCT in the last 72 hours. Urine analysis:    Component Value Date/Time   COLORURINE YELLOW 01/12/2018 2050   APPEARANCEUR CLEAR 01/12/2018 2050   LABSPEC 1.014 01/12/2018 2050   PHURINE 5.0 01/12/2018 2050   GLUCOSEU NEGATIVE 01/12/2018 2050   HGBUR NEGATIVE 01/12/2018 2050   BILIRUBINUR NEGATIVE 01/12/2018 2050   KETONESUR NEGATIVE 01/12/2018 2050   PROTEINUR NEGATIVE 01/12/2018 2050   NITRITE NEGATIVE 01/12/2018 2050   LEUKOCYTESUR NEGATIVE 01/12/2018 2050     Ripudeep Rai M.D. [facility] 02/07/2018, 11:21 AM  Pager: [phone] Between 7am to 7pm - call Pager - [phone]  After 7pm go to [email or web address] - password [email or web address]  Call night coverage person covering after 7pm

## 2018-02-08 DIAGNOSIS — L8990 Pressure ulcer of unspecified site, unspecified stage: Secondary | ICD-10-CM

## 2018-02-08 NOTE — Plan of Care (Signed)
  Problem: Education: Goal: Knowledge of General Education information will improve Outcome: Progressing   Problem: Health Behavior/Discharge Planning: Goal: Ability to manage health-related needs will improve Outcome: Progressing   Problem: Clinical Measurements: Goal: Ability to maintain clinical measurements within normal limits will improve Outcome: Progressing Goal: Will remain free from infection Outcome: Progressing Goal: Diagnostic test results will improve Outcome: Progressing Goal: Respiratory complications will improve Outcome: Progressing Goal: Cardiovascular complication will be avoided Outcome: Progressing   Problem: Activity: Goal: Risk for activity intolerance will decrease Outcome: Progressing   Problem: Pain Managment: Goal: General experience of comfort will improve Outcome: Progressing   Problem: Skin Integrity: Goal: Risk for impaired skin integrity will decrease Outcome: Progressing   Problem: Education: Goal: Knowledge of disease or condition will improve Outcome: Progressing Goal: Knowledge of secondary prevention will improve Outcome: Progressing Goal: Knowledge of patient specific risk factors addressed and post discharge goals established will improve Outcome: Progressing   Problem: Coping: Goal: Will verbalize positive feelings about self Outcome: Progressing Goal: Will identify appropriate support needs Outcome: Progressing   Problem: Health Behavior/Discharge Planning: Goal: Ability to manage health-related needs will improve Outcome: Progressing   Problem: Self-Care: Goal: Ability to participate in self-care as condition permits will improve Outcome: Progressing Goal: Verbalization of feelings and concerns over difficulty with self-care will improve Outcome: Progressing Goal: Ability to communicate needs accurately will improve Outcome: Progressing   Problem: Nutrition: Goal: Risk of aspiration will decrease Outcome:  Progressing   Problem: Intracerebral Hemorrhage Tissue Perfusion: Goal: Complications of Intracerebral Hemorrhage will be minimized Outcome: Progressing

## 2018-02-08 NOTE — Progress Notes (Signed)
Nutrition Follow Up  DOCUMENTATION CODES:   Non-severe (moderate) malnutrition in context of social or environmental circumstances  INTERVENTION:     Ensure Enlive po QID, each supplement provides 350 kcal and 20 grams of protein  NUTRITION DIAGNOSIS:   Moderate Malnutrition related to social / environmental circumstances(homelessness, food insecurity) as evidenced by moderate fat depletion, moderate muscle depletion, energy intake < 75% for > or equal to 3 months, ongoing  GOAL:   Patient will meet greater than or equal to 90% of their needs, progressing  MONITOR:   PO intake, Supplement acceptance, Labs, Skin, Weight trends, I & O's  ASSESSMENT:   Patient with PMH Polysubstance abuse, homelessness, chronic Hep B and C, depression. 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 [facility] with chest pain, back pain, and exhibiting encephalopathy. 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 Currently on IV ABx for S Pneumoniae endocarditis  Pt continues on a Regular diet. PO intake 50% per flowsheet records. Drinking his Ensure Enlive supplements. Labs & medications reviewed. Finishing his ABX 6/11.  Diet Order:   Diet Order           Diet regular Room service appropriate? Yes; Fluid consistency: Thin  Diet effective now         EDUCATION NEEDS:   Not appropriate for education at this time  Skin:  Skin Assessment: Skin Integrity Issues: Skin Integrity Issues:: Stage II Stage II: coccyx  Last BM:  6/7  Height:   Ht Readings from Last 1 Encounters:  01/20/18 5\' 11"  (1.803 m)   Weight:   Wt Readings from Last 1 Encounters:  02/08/18 134 lb 12.8 oz (61.1 kg)   Ideal Body Weight:  78.18 kg  BMI:  Body mass index is 18.8 kg/m.  Estimated Nutritional Needs:   Kcal:  2000-2200  Protein:  95-110 gm  Fluid:  2.0-2.2 L  Maureen ChattersKatie Mckynzie Liwanag, RD,  LDN Pager #: [phone] After-Hours Pager #: [phone]

## 2018-02-08 NOTE — Progress Notes (Signed)
[facility]                                                                              Patient Demographics  Marcus [name], is a 42 y.o. male, DOB - 03/15/1976, ZOX:[id number]  Admit date - 01/14/2018   Admitting Physician Otho Najjar, MD  Outpatient Primary MD for the patient is [facility]  Outpatient specialists:   LOS - 25  days   Medical records reviewed and are as summarized below:    Chief Complaint  Patient presents with  . 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 The patient had a vegetation on the posterior leaflet of his mitral valve, and was found to have a subtotal occlusion of his aorta at the level of the IMA, as well as splenic and renal infarcts, treated with coumadin and antibiotics. He presented to the [facility] ED 5/15 with complaints of chest pain, back pain and found to be encephalopathic. 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 ID was consulted, patient placed on penicillin G. Blood cultures repeated at admission here remained negative. Coumadin was stopped due to ICH. TTE showed a 1.4 cm mobile vegetation on the posterior leaflet on the MV with severe regurgitation, though he's had no evidence of heart failure. Vascular srugery also consulted for distal aortoiliac occlusion, felt the patient could not undergo repair as there are no non-prosthetic options and this is contraindicated due to infection. Noted that distal pulses with monophasic with intact motor and sensation to both feet suggesting that these were chronic findings. 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 Palliative care was recommended and has followed with the patient, though he seems to have very few social supports.  Plan is to continue IV antibiotics x6 weeks through 6/11, then indefinite oral amoxicillin.    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  ID was consulted  -ID recommended IV penicillin for 6 weeks till 6/11, then indefinite amoxicillin. 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 -Palliative consulted, DNR  Polysubstance abuse, opioid use disorder Continue Suboxone, Dr. Oswaldo Done has seen the patient   Depression Continue Wellbutrin  Tobacco use Continue nicotine gum  Chronic hepatitis B and C -Outpatient follow-up with ID   MCA CVA/ICH, M2 occlusion -Cerebral angiogram showed no cerebral aneurysm, no further work-up per neurology, follow-up outpatient in 4 weeks with stroke clinic  Thrombocytosis, leukocytosis Improving, within normal limits  Anemia of chronic disease - transfuse if below 7.5 -H&H  currently stable  Moderate malnutrition Continue nutritional supplements  Sacral wound, unstageable -Wound care consulted, recommended frequent turning, repositioning to the patient  Code Status: DNR DVT Prophylaxis:   SCD's Family Communication: Discussed in detail with the patient, all imaging results, lab results explained to the patient    Disposition Plan: Patient has no medical complaints however very frustrated about the disposition, states he has no home.  Wants to talk to the social work and Sports coach today.  Finishing antibiotics tomorrow 6/11  Time Spent in minutes  15 minutes  Procedures:  TTE on 5/16  Consultants:   Cardiology CT VS Vascular surgery ID Palliative care  Antimicrobials:   Penicillin G   Medications  Scheduled Meds: . buprenorphine-naloxone  1 tablet Sublingual Daily  . buPROPion  150 mg Oral Daily  . famotidine  20 mg Oral BID  . feeding supplement (ENSURE ENLIVE)  237 mL Oral TID PC & HS  . polyethylene glycol  17 g Oral Daily  . senna-docusate  1 tablet Oral QHS   Continuous Infusions: . pencillin G potassium IV 4 Million Units (02/08/18 0830)   PRN Meds:.acetaminophen, bisacodyl, nicotine polacrilex, ondansetron (ZOFRAN) IV, RESOURCE THICKENUP CLEAR   Antibiotics   Anti-infectives (From admission, onward)   Start     Dose/Rate Route Frequency Ordered Stop   01/19/18 1600  penicillin G potassium 4 Million Units in dextrose 5 % 250 mL IVPB     4 Million Units 250 mL/hr over 60 Minutes Intravenous Every 4 hours 01/19/18 1424 02/09/18 2359   01/18/18 2200  vancomycin (VANCOCIN) IVPB 750 mg/150 ml premix  Status:  Discontinued     750 mg 150 mL/hr over 60 Minutes Intravenous Every 8 hours 01/18/18 1431 01/19/18 1424   01/14/18 2000  vancomycin (VANCOCIN) IVPB 1000 mg/200 mL premix  Status:  Discontinued     1,000 mg 200 mL/hr over 60 Minutes Intravenous Every 8 hours 01/14/18 1103 01/18/18 1431   01/14/18 2000   piperacillin-tazobactam (ZOSYN) IVPB 3.375 g  Status:  Discontinued     3.375 g 12.5 mL/hr over 240 Minutes Intravenous Every 8 hours 01/14/18 1103 01/16/18 1211   01/14/18 1100  vancomycin (VANCOCIN) 1,250 mg in sodium chloride 0.9 % 250 mL IVPB     1,250 mg 166.7 mL/hr over 90 Minutes Intravenous  Once 01/14/18 1036 01/14/18 1631   01/14/18 1045  piperacillin-tazobactam (ZOSYN) IVPB 3.375 g     3.375 g 100 mL/hr over 30 Minutes Intravenous  Once 01/14/18 1036 01/14/18 1228        Subjective:   Marcus [name] was seen and examined today.  No complaints per patient, frustrated about disposition.  No fevers or chills.  No chest pain, shortness of breath, abdominal pain, nausea vomiting Objective:   Vitals:   02/07/18 0800 02/07/18 2000 02/08/18 0600 02/08/18 0843  BP: (!) 90/55 102/60 (!) 98/56 96/60  Pulse:   (!) 57 80  Resp:  18 18 20   Temp:  98.5 F (36.9 C) 97.6  F (36.4 C) 98.1 F (36.7 C)  TempSrc:  Oral Oral Oral  SpO2:  98% 100% 97%  Weight:  62.8 kg (138 lb 6.4 oz) 61.1 kg (134 lb 12.8 oz)   Height:        Intake/Output Summary (Last 24 hours) at 02/08/2018 0942 Last data filed at 02/08/2018 0300 Gross per 24 hour  Intake 1050 ml  Output 1200 ml  Net -150 ml     Wt Readings from Last 3 Encounters:  02/08/18 61.1 kg (134 lb 12.8 oz)  01/14/18 63.5 kg (140 lb)  01/13/18 63.5 kg (140 lb)     Exam   General: Alert and oriented x 3, NAD  Eyes:   HEENT:   Cardiovascular: S1 S2 auscultated, 3/6 systolic murmur, RRR  No pedal edema b/l  Respiratory: CTAB  Gastrointestinal: Soft, nontender, nondistended, + bowel sounds  Ext: no pedal edema bilaterally  Neuro: no new deficit  Musculoskeletal: No digital cyanosis, clubbing  Skin: No rashes  Psych: Normal affect and demeanor, alert and oriented x3   Data Reviewed:  I have personally reviewed following labs and imaging studies  Micro Results No results found for this or any previous visit (from the  past 240 hour(s)).  Radiology Reports Dg Chest 2 View  Result Date: 01/14/2018 CLINICAL DATA:  Left chest pain EXAM: CHEST - 2 VIEW COMPARISON:  01/13/2018 FINDINGS: Heart and mediastinal contours are within normal limits. No focal opacities or effusions. No acute bony abnormality. IMPRESSION: No active cardiopulmonary disease. Electronically Signed   By: Charlett Nose M.D.   On: 01/14/2018 08:29   Dg Chest 2 View  Result Date: 01/13/2018 CLINICAL DATA:  Weakness, hungry, pain.  History of hepatitis-C. EXAM: CHEST - 2 VIEW COMPARISON:  None. FINDINGS: The heart size and mediastinal contours are within normal limits. Both lungs are clear. The visualized skeletal structures are unremarkable. IMPRESSION: Negative. Electronically Signed   By: Awilda Metro M.D.   On: 01/13/2018 19:46   Ct Head Wo Contrast  Result Date: 01/24/2018 CLINICAL DATA:  Continued surveillance infarcts and hemorrhage. EXAM: CT HEAD WITHOUT CONTRAST TECHNIQUE: Contiguous axial images were obtained from the base of the skull through the vertex without intravenous contrast. COMPARISON:  CT head 01/14/2018. MR head 01/14/2018. CT angiogram 01/18/2018. FINDINGS: Brain: Previously identified subacute infarcts are involving into areas of chronic ischemia affecting the BILATERAL RIGHT greater than LEFT subcortical white matter. Tiny focus of hyperattenuation, RIGHT frontal cortex, image 23 appears improved. This measures no more than 2 x 3 mm on today's exam. Vascular: Improved appearance of the previously identified RIGHT M2 occlusion. Some hyperattenuation of vessel wall in the sylvian fissure, see series 3, image 12. Skull: Normal. Negative for fracture or focal lesion. Sinuses/Orbits: No acute finding. Other: None. IMPRESSION: Previously identified subacute white matter infarcts are have evolved into areas of chronic ischemia. Tiny focus of hyperattenuation RIGHT frontal cortex is improved. Improved appearance of the previously  identified RIGHT M2 occlusion. Electronically Signed   By: Elsie Stain M.D.   On: 01/24/2018 17:38   Ct Head Wo Contrast  Addendum Date: 01/14/2018   ADDENDUM REPORT: 01/14/2018 10:35 ADDENDUM: Study discussed by telephone with Dr. Shaune Pollack on 01/14/2018 at 1015 hours. 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 THEREFORE, the appearance is highly likely to reflect SEPTIC EMBOLI, with acute embolic related hemorrhage in the right superior frontal lobe. Electronically Signed   By: Odessa Fleming M.D.   On: 01/14/2018 10:35   Result  Date: 01/14/2018 CLINICAL DATA:  42 year old male with altered level consciousness, lightheadedness and dizzy onset about 1 hour ago. EXAM: CT HEAD WITHOUT CONTRAST TECHNIQUE: Contiguous axial images were obtained from the base of the skull through the vertex without intravenous contrast. COMPARISON:  None. FINDINGS: Brain: Cytotoxic edema at the right anterior insula and frontal operculum. No associated hemorrhage or mass effect in those segments. Small amorphous 7 millimeter focus of hyperdensity at the right superior frontal gyrus on series 3, image 25 and sagittal image 26. On coronal images there is adjacent hypodensity (coronal image 34). No regional mass effect. No other cytotoxic edema identified. ASPECTS 7 (abnormal insula, M4, M 5). More chronic appearing left corona radiata white matter hypodensity on the left. No intracranial mass effect or midline shift. No ventriculomegaly. Normal basilar cisterns. Vascular: Abnormal cylindrical hyperdensity at the right MCA proximal M2 segment with Vessel expansion (series 3, image 14 and series 6, image 21). Skull: No acute osseous abnormality identified. Sinuses/Orbits: Visualized paranasal sinuses and mastoids are well pneumatized. Other: Mildly Disconjugate gaze, otherwise negative orbits soft tissues. No acute scalp soft tissue findings, scattered benign appearing scalp dermal nodularity. IMPRESSION: 1. Evidence of large  vessel occlusion at the right MCA proximal M2. 2. Possible trace focus of acute hemorrhage in the right superior frontal gyrus on series 3, image 25. No other acute intracranial hemorrhage identified. 3. Right MCA territory cytotoxic edema, ASPECTS 7 (although note suspicion of trace acute hemorrhage in #2). 4. Chronic appearing left MCA white matter disease. Electronically Signed: By: Odessa FlemingH  Hall M.D. On: 01/14/2018 10:18   Ct Angio Abdomen W &/or Wo Contrast  Result Date: 01/21/2018 CLINICAL DATA:  Aorta occlusion EXAM: CT ANGIOGRAPHY ABDOMEN TECHNIQUE: Multidetector CT imaging of the abdomen was performed using the standard protocol during bolus administration of intravenous contrast. Multiplanar reconstructed images and MIPs were obtained and reviewed to evaluate the vascular anatomy. CONTRAST:  100mL ISOVUE-370 IOPAMIDOL (ISOVUE-370) INJECTION 76% [facility]  [facility] FINDINGS: VASCULAR Aorta: The aorta is abruptly occluded just below the takeoff of the IMA. The borders of the abdominal aorta are poorly defined at the level of the occlusion. Mild aneurysmal dilatation of the distal aorta measuring up to 3.7 cm is suspected. At the level of the occlusion, there is mixed low-density and isodensity within the thrombus, as well as a lack of atherosclerotic calcification, suggesting subacute rather than acute age. There is also a mild thickening of the wall of the aorta with stranding in the adjacent fat. These findings suggest an inflammatory process. Celiac: Patent and ectatic. There is abrupt occlusion of the splenic artery, 2 cm from the hilum of the spleen. Distal branches reconstitute. The hepatic artery and its branches are grossly patent. Gastroduodenal artery is patent. SMA: Patent and ectatic. Renals: 2 right renal arteries and a single left renal artery are patent. IMA: Patent. Inflow: Bilateral common iliac arteries are occluded. There is some filling of  the mid and distal left common iliac artery with contrast flow. The left common iliac artery measures 1.9 cm in maximal caliber. Right common iliac artery measures 3.6 cm in caliber. The left internal and external iliac arteries reconstitute and are partially imaged. Veins: No evidence of DVT. Review of the MIP images confirms the above findings. NON-VASCULAR Lower chest: No acute abnormality. Hepatobiliary: Unremarkable Pancreas: Unremarkable Spleen: There is a large nonspecific there is a complex cystic lesion in the spleen measuring up to 11.6 cm. Adrenals/Urinary Tract: There is scarring  in the lower pole of the left kidney associated with wedge-shaped areas of low density. These findings likely represent small infarcts. This is also present in the upper pole of the right kidney to a lesser degree. Stomach/Bowel: No obvious mass in the colon. No evidence of small-bowel obstruction. Lymphatic: Small para-aortic lymph nodes are present. Other: No free fluid.  Postoperative changes from appendectomy. Musculoskeletal: No vertebral compression deformity. Bilateral L5 pars defects. No evidence of anterolisthesis. IMPRESSION: VASCULAR There is abrupt occlusion of the aorta below the level of the IMA as described on the prior report. Mixed density within the aorta suggest subacute age. Also, there is thickening and inflammatory change of the wall of the aorta suggesting vasculitis or mycotic aneurysm. There is also associated occlusion of the bilateral common iliac arteries. Focal occlusion of the splenic artery. NON-VASCULAR Complex cystic lesion in the spleen is nonspecific. Small infarcts in scarring in the kidneys bilaterally. Electronically Signed   By: Jolaine Click M.D.   On: 01/21/2018 12:05   Mr Maxine Glenn Head Wo Contrast  Result Date: 01/14/2018 CLINICAL DATA:  In hospital for sepsis, altered mental status. History of hepatitis-C, polysubstance abuse and endocarditis. History of head injury 1 month ago. Follow-up  RIGHT MCA stroke. EXAM: MRI HEAD WITHOUT CONTRAST MRA HEAD WITHOUT CONTRAST TECHNIQUE: Multiplanar, multiecho pulse sequences of the brain and surrounding structures were obtained without intravenous contrast. Angiographic images of the head were obtained using MRA technique without contrast. COMPARISON:  CT HEAD Jan 14, 2018 at 0951 hours. FINDINGS: MRI HEAD FINDINGS Multiple sequences are mildly or moderately motion degraded. INTRACRANIAL CONTENTS: Patchy reduced diffusion RIGHT frontal lobe and RIGHT basal ganglia, LEFT posterior frontal lobe with normalized ADC values and focal T2 shine through. Associated FLAIR T2 hyperintense signal and faint hemosiderin staining. Slightly expanded FLAIR T2 hyperintense RIGHT basal ganglia. Superimposed focal susceptibility artifact bifrontal lobes, RIGHT parietooccipital lobe. Multiple additional scattered micro hemorrhages. Mild parenchymal brain volume loss. No hydrocephalus. Old small RIGHT cerebellar infarcts. VASCULAR: Susceptibility artifact and T1 shortening RIGHT sylvian fissure corresponding to MCA density. SKULL AND UPPER CERVICAL SPINE: No abnormal sellar expansion. No suspicious calvarial bone marrow signal. Craniocervical junction maintained. SINUSES/ORBITS: The mastoid air-cells and included paranasal sinuses are well-aerated.The included ocular globes and orbital contents are non-suspicious. OTHER: None. MRA HEAD FINDINGS ANTERIOR CIRCULATION: Normal flow related enhancement of the included cervical, petrous, cavernous and supraclinoid internal carotid arteries. Patent anterior communicating artery. Mildly attenuated RIGHT MCA flow related enhancement, occluded RIGHT M2 segment with transient aneurysmal reconstitution. No flow limiting stenosis. POSTERIOR CIRCULATION: vertebral artery is dominant. Basilar artery is patent, with normal flow related enhancement of the main branch vessels. Patent posterior cerebral arteries. Robust RIGHT and smaller LEFT posterior  communicating arteries present. No large vessel occlusion, flow limiting stenosis,  aneurysm. ANATOMIC VARIANTS: None. Source images and MIP images were reviewed. IMPRESSION: MRI HEAD: 1. Multifocal subacute bilateral MCA/watershed territory infarcts. 2. Peripheral susceptibility artifact, septic emboli suspected. There may be superimposed shear injury or underlying chronic hypertension. 3. Mild parenchymal brain volume loss for age. 4. Old small RIGHT cerebellar infarcts. MRA HEAD: 1. 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 2. Otherwise negative MRA head. Examination reviewed by Dr. Augusto Gamble with Dr. Wilford Corner, Neurology on Jan 14, 2018 at approximately 1425 hours. Electronically Signed   By: Awilda Metro M.D.   On: 01/14/2018 14:38   Mr Brain Wo Contrast  Result Date: 01/14/2018 CLINICAL DATA:  In hospital  for sepsis, altered mental status. History of hepatitis-C, polysubstance abuse and endocarditis. History of head injury 1 month ago. Follow-up RIGHT MCA stroke. EXAM: MRI HEAD WITHOUT CONTRAST MRA HEAD WITHOUT CONTRAST TECHNIQUE: Multiplanar, multiecho pulse sequences of the brain and surrounding structures were obtained without intravenous contrast. Angiographic images of the head were obtained using MRA technique without contrast. COMPARISON:  CT HEAD Jan 14, 2018 at 0951 hours. FINDINGS: MRI HEAD FINDINGS Multiple sequences are mildly or moderately motion degraded. INTRACRANIAL CONTENTS: Patchy reduced diffusion RIGHT frontal lobe and RIGHT basal ganglia, LEFT posterior frontal lobe with normalized ADC values and focal T2 shine through. Associated FLAIR T2 hyperintense signal and faint hemosiderin staining. Slightly expanded FLAIR T2 hyperintense RIGHT basal ganglia. Superimposed focal susceptibility artifact bifrontal lobes, RIGHT parietooccipital lobe. Multiple additional scattered micro hemorrhages. Mild  parenchymal brain volume loss. No hydrocephalus. Old small RIGHT cerebellar infarcts. VASCULAR: Susceptibility artifact and T1 shortening RIGHT sylvian fissure corresponding to MCA density. SKULL AND UPPER CERVICAL SPINE: No abnormal sellar expansion. No suspicious calvarial bone marrow signal. Craniocervical junction maintained. SINUSES/ORBITS: The mastoid air-cells and included paranasal sinuses are well-aerated.The included ocular globes and orbital contents are non-suspicious. OTHER: None. MRA HEAD FINDINGS ANTERIOR CIRCULATION: Normal flow related enhancement of the included cervical, petrous, cavernous and supraclinoid internal carotid arteries. Patent anterior communicating artery. Mildly attenuated RIGHT MCA flow related enhancement, occluded RIGHT M2 segment with transient aneurysmal reconstitution. No flow limiting stenosis. POSTERIOR CIRCULATION: vertebral artery is dominant. Basilar artery is patent, with normal flow related enhancement of the main branch vessels. Patent posterior cerebral arteries. Robust RIGHT and smaller LEFT posterior communicating arteries present. No large vessel occlusion, flow limiting stenosis,  aneurysm. ANATOMIC VARIANTS: None. Source images and MIP images were reviewed. IMPRESSION: MRI HEAD: 1. Multifocal subacute bilateral MCA/watershed territory infarcts. 2. Peripheral susceptibility artifact, septic emboli suspected. There may be superimposed shear injury or underlying chronic hypertension. 3. Mild parenchymal brain volume loss for age. 4. Old small RIGHT cerebellar infarcts. MRA HEAD: 1. 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 2. Otherwise negative MRA head. Examination reviewed by Dr. Augusto Gamble with Dr. Wilford Corner, Neurology on Jan 14, 2018 at approximately 1425 hours. Electronically Signed   By: Awilda Metro M.D.   On: 01/14/2018 14:38   Dg Chest Port 1 View  Result  Date: 01/15/2018 CLINICAL DATA:  Shortness of breath.  Acute mental status change. EXAM: PORTABLE CHEST 1 VIEW COMPARISON:  Jan 14, 2018 FINDINGS: No pneumothorax. Vascular crowding in the medial right lung base. No suspicious infiltrate. Mild atelectasis in the left base. The cardiomediastinal silhouette is stable. No other changes. IMPRESSION: Mild atelectasis in the left base.  No other significant change. Electronically Signed   By: Gerome Sam III M.D   On: 01/15/2018 07:40   Ir Angio Intra Extracran Sel Com Carotid Innominate Bilat Mod Sed  Result Date: 01/19/2018 CLINICAL DATA:  Focal right frontal hemorrhage. 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 EXAM: IR ANGIO VERTEBRAL SEL VERTEBRAL UNI RIGHT MOD SED; IR ULTRASOUND GUIDANCE VASC ACCESS RIGHT; IR ANGIO VERTEBRAL SEL SUBCLAVIAN INNOMINATE UNI LEFT MOD SED; BILATERAL COMMON CAROTID AND INNOMINATE ANGIOGRAPHY COMPARISON:  MRI MRA of the brain of 01/14/2018. MEDICATIONS: Heparin 0 units IV; no antibiotic was administered within 1 hour of the procedure. ANESTHESIA/SEDATION: Versed 0.5 mg IV; Fentanyl 37.5 mcg IV. Moderate Sedation Time:  75 minutes. The patient was continuously monitored during the procedure by the interventional radiology  nurse under my direct supervision. CONTRAST:  Isovue 300 approximately 60 mL. FLUOROSCOPY TIME:  Fluoroscopy Time: 8 minutes 42 seconds (822 mGy). COMPLICATIONS: None immediate. TECHNIQUE: Informed written consent was obtained from the patient after a thorough discussion of the procedural risks, benefits and alternatives. All questions were addressed. Maximal Sterile Barrier Technique was utilized including caps, mask, sterile gowns, sterile gloves, sterile drape, hand hygiene and skin antiseptic. A timeout was performed prior to the initiation of the procedure. The right antecubital fossa was prepped and draped in the usual sterile fashion. Thereafter using modified Seldinger technique,  transbrachial access into the right brachial artery was obtained without difficulty. Over a 0.035 inch guidewire, a 5 French Pinnacle sheath was inserted. Through this, and also over 0.035 inch guidewire, a 5 French Simmons 2 catheter was advanced to the aortic arch region and formed without difficulty. It was selectively positioned in the right common carotid artery, the right vertebral artery, the left common carotid artery and the left subclavian artery. FINDINGS: The right vertebral artery origin is widely patent. The vessel is seen to opacify normally to the cranial skull base. Wide patency is seen of the right vertebrobasilar junction and the right posterior-inferior cerebellar artery. The opacified portion of the basilar artery, the posterior cerebral arteries, the superior cerebellar arteries and the anterior-inferior cerebellar arteries appear normal into the capillary and venous phases. Non-opacified blood is seen in the basilar artery from contralateral vertebral artery. The left common carotid arteriogram demonstrates the left external carotid artery and its major branches to be widely patent. The left internal carotid artery at the bulb to the cranial skull base is widely patent. There is mild tortuosity in the proximal 1/3 of the left internal carotid artery. The petrous, cavernous and the supraclinoid segments are widely patent. The left middle cerebral artery and the left anterior cerebral artery opacify normally into the capillary and venous phases. The venous phase demonstrates retrograde opacification of the left transverse sinus in its proximal 2/3 primarily from the ipsilateral vein of Labbe. The right common carotid arteriogram demonstrates the right external carotid artery and its major branches to be widely patent. The right internal carotid artery at the bulb to the cranial skull base is widely patent. There is a double U shaped tortuosity of the middle 1/3 of the right internal carotid  artery without evidence of kinking. More distally, the right internal carotid artery is seen to opacify normally to the cranial skull base. The distal petrous, and the proximal cavernous segments demonstrate mild fusiform dilatation. Distal to this the supraclinoid right ICA is normally patent. The right middle cerebral artery and the right anterior cerebral artery demonstrate normal opacification into the capillary and venous phases. The right transverse sinus consistently demonstrates focal areas of moderate irregularity with questionable stenosis at its distal aspect. The left vertebral artery origin demonstrates mild stenosis. The vessel is seen to opacify normally to the cranial skull base. Wide patency is seen of the left vertebrobasilar junction and the left posterior-inferior cerebellar artery. The opacified portion of the distal left vertebrobasilar junction, the basilar artery, the posterior cerebral arteries, the superior cerebellar arteries and the anterior-cerebellar arteries is grossly patent into the delayed arterial phase. IMPRESSION: Angiographically no evidence of intracranial aneurysms, arteriovenous malformations, dural AV fistula, dissections, or of intraluminal filling defects. Venous outflow demonstrates areas of caliber irregularity involving the right transverse sinus with suspicion of stenosis. However, patient motion precludes fine detail in this region. PLAN: As per referring physician. Electronically Signed  By: Julieanne Cotton M.D.   On: 01/18/2018 13:10   Ir Angio Vertebral Sel Subclavian Innominate Uni L Mod Sed  Result Date: 01/19/2018 CLINICAL DATA:  Focal right frontal hemorrhage. 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 EXAM: IR ANGIO VERTEBRAL SEL VERTEBRAL UNI RIGHT MOD SED; IR ULTRASOUND GUIDANCE VASC ACCESS RIGHT; IR ANGIO VERTEBRAL SEL SUBCLAVIAN INNOMINATE UNI LEFT MOD SED; BILATERAL COMMON CAROTID AND INNOMINATE ANGIOGRAPHY COMPARISON:  MRI  MRA of the brain of 01/14/2018. MEDICATIONS: Heparin 0 units IV; no antibiotic was administered within 1 hour of the procedure. ANESTHESIA/SEDATION: Versed 0.5 mg IV; Fentanyl 37.5 mcg IV. Moderate Sedation Time:  75 minutes. The patient was continuously monitored during the procedure by the interventional radiology nurse under my direct supervision. CONTRAST:  Isovue 300 approximately 60 mL. FLUOROSCOPY TIME:  Fluoroscopy Time: 8 minutes 42 seconds (822 mGy). COMPLICATIONS: None immediate. TECHNIQUE: Informed written consent was obtained from the patient after a thorough discussion of the procedural risks, benefits and alternatives. All questions were addressed. Maximal Sterile Barrier Technique was utilized including caps, mask, sterile gowns, sterile gloves, sterile drape, hand hygiene and skin antiseptic. A timeout was performed prior to the initiation of the procedure. The right antecubital fossa was prepped and draped in the usual sterile fashion. Thereafter using modified Seldinger technique, transbrachial access into the right brachial artery was obtained without difficulty. Over a 0.035 inch guidewire, a 5 French Pinnacle sheath was inserted. Through this, and also over 0.035 inch guidewire, a 5 French Simmons 2 catheter was advanced to the aortic arch region and formed without difficulty. It was selectively positioned in the right common carotid artery, the right vertebral artery, the left common carotid artery and the left subclavian artery. FINDINGS: The right vertebral artery origin is widely patent. The vessel is seen to opacify normally to the cranial skull base. Wide patency is seen of the right vertebrobasilar junction and the right posterior-inferior cerebellar artery. The opacified portion of the basilar artery, the posterior cerebral arteries, the superior cerebellar arteries and the anterior-inferior cerebellar arteries appear normal into the capillary and venous phases. Non-opacified blood is  seen in the basilar artery from contralateral vertebral artery. The left common carotid arteriogram demonstrates the left external carotid artery and its major branches to be widely patent. The left internal carotid artery at the bulb to the cranial skull base is widely patent. There is mild tortuosity in the proximal 1/3 of the left internal carotid artery. The petrous, cavernous and the supraclinoid segments are widely patent. The left middle cerebral artery and the left anterior cerebral artery opacify normally into the capillary and venous phases. The venous phase demonstrates retrograde opacification of the left transverse sinus in its proximal 2/3 primarily from the ipsilateral vein of Labbe. The right common carotid arteriogram demonstrates the right external carotid artery and its major branches to be widely patent. The right internal carotid artery at the bulb to the cranial skull base is widely patent. There is a double U shaped tortuosity of the middle 1/3 of the right internal carotid artery without evidence of kinking. More distally, the right internal carotid artery is seen to opacify normally to the cranial skull base. The distal petrous, and the proximal cavernous segments demonstrate mild fusiform dilatation. Distal to this the supraclinoid right ICA is normally patent. The right middle cerebral artery and the right anterior cerebral artery demonstrate normal opacification into the capillary and venous phases. The right transverse sinus consistently demonstrates focal areas of moderate  irregularity with questionable stenosis at its distal aspect. The left vertebral artery origin demonstrates mild stenosis. The vessel is seen to opacify normally to the cranial skull base. Wide patency is seen of the left vertebrobasilar junction and the left posterior-inferior cerebellar artery. The opacified portion of the distal left vertebrobasilar junction, the basilar artery, the posterior cerebral arteries, the  superior cerebellar arteries and the anterior-cerebellar arteries is grossly patent into the delayed arterial phase. IMPRESSION: Angiographically no evidence of intracranial aneurysms, arteriovenous malformations, dural AV fistula, dissections, or of intraluminal filling defects. Venous outflow demonstrates areas of caliber irregularity involving the right transverse sinus with suspicion of stenosis. However, patient motion precludes fine detail in this region. PLAN: As per referring physician. Electronically Signed   By: Julieanne Cotton M.D.   On: 01/18/2018 13:10   Ir Angio Vertebral Sel Vertebral Uni R Mod Sed  Result Date: 01/19/2018 CLINICAL DATA:  Focal right frontal hemorrhage. 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 EXAM: IR ANGIO VERTEBRAL SEL VERTEBRAL UNI RIGHT MOD SED; IR ULTRASOUND GUIDANCE VASC ACCESS RIGHT; IR ANGIO VERTEBRAL SEL SUBCLAVIAN INNOMINATE UNI LEFT MOD SED; BILATERAL COMMON CAROTID AND INNOMINATE ANGIOGRAPHY COMPARISON:  MRI MRA of the brain of 01/14/2018. MEDICATIONS: Heparin 0 units IV; no antibiotic was administered within 1 hour of the procedure. ANESTHESIA/SEDATION: Versed 0.5 mg IV; Fentanyl 37.5 mcg IV. Moderate Sedation Time:  75 minutes. The patient was continuously monitored during the procedure by the interventional radiology nurse under my direct supervision. CONTRAST:  Isovue 300 approximately 60 mL. FLUOROSCOPY TIME:  Fluoroscopy Time: 8 minutes 42 seconds (822 mGy). COMPLICATIONS: None immediate. TECHNIQUE: Informed written consent was obtained from the patient after a thorough discussion of the procedural risks, benefits and alternatives. All questions were addressed. Maximal Sterile Barrier Technique was utilized including caps, mask, sterile gowns, sterile gloves, sterile drape, hand hygiene and skin antiseptic. A timeout was performed prior to the initiation of the procedure. The right antecubital fossa was prepped and draped in the usual  sterile fashion. Thereafter using modified Seldinger technique, transbrachial access into the right brachial artery was obtained without difficulty. Over a 0.035 inch guidewire, a 5 French Pinnacle sheath was inserted. Through this, and also over 0.035 inch guidewire, a 5 French Simmons 2 catheter was advanced to the aortic arch region and formed without difficulty. It was selectively positioned in the right common carotid artery, the right vertebral artery, the left common carotid artery and the left subclavian artery. FINDINGS: The right vertebral artery origin is widely patent. The vessel is seen to opacify normally to the cranial skull base. Wide patency is seen of the right vertebrobasilar junction and the right posterior-inferior cerebellar artery. The opacified portion of the basilar artery, the posterior cerebral arteries, the superior cerebellar arteries and the anterior-inferior cerebellar arteries appear normal into the capillary and venous phases. Non-opacified blood is seen in the basilar artery from contralateral vertebral artery. The left common carotid arteriogram demonstrates the left external carotid artery and its major branches to be widely patent. The left internal carotid artery at the bulb to the cranial skull base is widely patent. There is mild tortuosity in the proximal 1/3 of the left internal carotid artery. The petrous, cavernous and the supraclinoid segments are widely patent. The left middle cerebral artery and the left anterior cerebral artery opacify normally into the capillary and venous phases. The venous phase demonstrates retrograde opacification of the left transverse sinus in its proximal 2/3 primarily from the ipsilateral vein of  Labbe. The right common carotid arteriogram demonstrates the right external carotid artery and its major branches to be widely patent. The right internal carotid artery at the bulb to the cranial skull base is widely patent. There is a double U shaped  tortuosity of the middle 1/3 of the right internal carotid artery without evidence of kinking. More distally, the right internal carotid artery is seen to opacify normally to the cranial skull base. The distal petrous, and the proximal cavernous segments demonstrate mild fusiform dilatation. Distal to this the supraclinoid right ICA is normally patent. The right middle cerebral artery and the right anterior cerebral artery demonstrate normal opacification into the capillary and venous phases. The right transverse sinus consistently demonstrates focal areas of moderate irregularity with questionable stenosis at its distal aspect. The left vertebral artery origin demonstrates mild stenosis. The vessel is seen to opacify normally to the cranial skull base. Wide patency is seen of the left vertebrobasilar junction and the left posterior-inferior cerebellar artery. The opacified portion of the distal left vertebrobasilar junction, the basilar artery, the posterior cerebral arteries, the superior cerebellar arteries and the anterior-cerebellar arteries is grossly patent into the delayed arterial phase. IMPRESSION: Angiographically no evidence of intracranial aneurysms, arteriovenous malformations, dural AV fistula, dissections, or of intraluminal filling defects. Venous outflow demonstrates areas of caliber irregularity involving the right transverse sinus with suspicion of stenosis. However, patient motion precludes fine detail in this region. PLAN: As per referring physician. Electronically Signed   By: Julieanne Cotton M.D.   On: 01/18/2018 13:10    Lab Data:  CBC: Recent Labs  Lab 02/05/18 0411  WBC 5.6  HGB 10.1*  HCT 33.9*  MCV 90.4  PLT 338   Basic Metabolic Panel: Recent Labs  Lab 02/05/18 0411  NA 137  K 3.8  CL 104  CO2 28  GLUCOSE 111*  BUN 10  CREATININE 0.70  CALCIUM 9.0   GFR: Estimated Creatinine Clearance: 106.1 mL/min (by C-G formula based on SCr of 0.7 mg/dL). Liver  Function Tests: No results for input(s): AST, ALT, ALKPHOS, BILITOT, PROT, ALBUMIN in the last 168 hours. No results for input(s): LIPASE, AMYLASE in the last 168 hours. No results for input(s): AMMONIA in the last 168 hours. Coagulation Profile: No results for input(s): INR, PROTIME in the last 168 hours. Cardiac Enzymes: No results for input(s): CKTOTAL, CKMB, CKMBINDEX, TROPONINI in the last 168 hours. BNP (last 3 results) No results for input(s): PROBNP in the last 8760 hours. HbA1C: No results for input(s): HGBA1C in the last 72 hours. CBG: No results for input(s): GLUCAP in the last 168 hours. Lipid Profile: No results for input(s): CHOL, HDL, LDLCALC, TRIG, CHOLHDL, LDLDIRECT in the last 72 hours. Thyroid Function Tests: No results for input(s): TSH, T4TOTAL, FREET4, T3FREE, THYROIDAB in the last 72 hours. Anemia Panel: No results for input(s): VITAMINB12, FOLATE, FERRITIN, TIBC, IRON, RETICCTPCT in the last 72 hours. Urine analysis:    Component Value Date/Time   COLORURINE YELLOW 01/12/2018 2050   APPEARANCEUR CLEAR 01/12/2018 2050   LABSPEC 1.014 01/12/2018 2050   PHURINE 5.0 01/12/2018 2050   GLUCOSEU NEGATIVE 01/12/2018 2050   HGBUR NEGATIVE 01/12/2018 2050   BILIRUBINUR NEGATIVE 01/12/2018 2050   KETONESUR NEGATIVE 01/12/2018 2050   PROTEINUR NEGATIVE 01/12/2018 2050   NITRITE NEGATIVE 01/12/2018 2050   LEUKOCYTESUR NEGATIVE 01/12/2018 2050     Ripudeep Rai M.D. [facility] 02/08/2018, 9:42 AM  Pager: [phone] Between 7am to 7pm - call Pager - [phone]  After 7pm  go to [email or web address] - password [email or web address]  Call night coverage person covering after 7pm

## 2018-02-08 NOTE — Progress Notes (Signed)
Clinical Social Worker following patient for support and discharge needs. CSW and RNCM met patient at bedside to discuss discharge plan once patient becomes medically clear. CSW went over homeless resources and gave patient another copy of resources (pt was given resources several weeks ago). CSW encouraged patient to reach out to homeless shelters in the area. Patient was given information on the [facility] for extra resources to assist once discharge.  CSW and RNCM answered patients questions on why he is unable to go to a SNF for rehab. Patient stated he is disappointed with the way the system. CSW is signing off as patients social work needs have been addressed. Please consult CSW again if new needs arise   Rhea Pink, MSW,  Reader

## 2018-02-08 NOTE — Plan of Care (Signed)
  Problem: Self-Care: Goal: Verbalization of feelings and concerns over difficulty with self-care will improve Outcome: Progressing  Patient expressed concern about being discharged with his sacral pressure injury. Doesn't know how he will manage it without being in the hospital. Explained to patient the importance of turning frequently and staying off of his back to reduce the pressure on the area.

## 2018-02-08 NOTE — Progress Notes (Signed)
Physical Therapy Treatment Patient Details Name: Marcus [name] MRN: [id number] DOB: [birth year] Today's Date: 02/08/2018    History of Present Illness  42 yo homeless man. 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 Patient came  with exacerbation of his back pain. 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 PMH - hep c, polysubstance abuse.     PT Comments    Pt mobilizing well with walker. Continues to report foot soreness. Had instructed pt to amb in hallways on his own this weekend. When asked if he had walked this weekend pt stated "A little." Unsure if pt followed through with this at all. Recommend rolling walker for DC.   Follow Up Recommendations  No PT follow up     Equipment Recommendations  Rolling walker with 5" wheels    Recommendations for Other Services       Precautions / Restrictions Precautions Precautions: Fall Restrictions Weight Bearing Restrictions: No    Mobility  Bed Mobility Overal bed mobility: Modified Independent                Transfers Overall transfer level: Modified independent Equipment used: None Transfers: Sit to/from Stand Sit to Stand: Modified independent (Device/Increase time)            Ambulation/Gait Ambulation/Gait assistance: Modified independent (Device/Increase time)   Assistive device: Rolling walker (2 wheeled) Gait Pattern/deviations: Step-through pattern;Decreased stride length Gait velocity: decr Gait velocity interpretation: 1.31 - 2.62 ft/sec, indicative of limited community ambulator General Gait Details: Pt with steady gait with rolling walker. Pt using walker due to painful feet.   Stairs Stairs: Yes Stairs  assistance: Min guard Stair Management: Two rails;Step to pattern;Forwards;Backwards Number of Stairs: 2 General stair comments: assist for safety   Wheelchair Mobility    Modified Rankin (Stroke Patients Only)       Balance Overall balance assessment: Needs assistance Sitting-balance support: No upper extremity supported;Feet supported Sitting balance-Leahy Scale: Good     Standing balance support: No upper extremity supported;During functional activity Standing balance-Leahy Scale: Good Standing balance comment: able to manuver in room and bathroom without loss of balance                            Cognition Arousal/Alertness: Awake/alert Behavior During Therapy: WFL for tasks assessed/performed Overall Cognitive Status: Within Functional Limits for tasks assessed                                        Exercises      General Comments        Pertinent Vitals/Pain Pain Assessment: Faces Faces Pain Scale: Hurts even more Pain Location: feet Pain Descriptors / Indicators: Sore Pain Intervention(s): Limited activity within patient's tolerance    Home Living                      Prior Function            PT Goals (current goals can now be found in the care plan section) Acute Rehab PT Goals PT Goal  Formulation: With patient Time For Goal Achievement: 2018-03-09 Potential to Achieve Goals: Good Progress towards PT goals: Progressing toward goals;Goals met and updated - see care plan    Frequency    Min 2X/week      PT Plan Current plan remains appropriate    Co-evaluation              AM-PAC PT "6 Clicks" Daily Activity  Outcome Measure  Difficulty turning over in bed (including adjusting bedclothes, sheets and blankets)?: None Difficulty moving from lying on back to sitting on the side of the bed? : None Difficulty sitting down on and standing up from a chair with arms (e.g., wheelchair, bedside commode,  etc,.)?: None Help needed moving to and from a bed to chair (including a wheelchair)?: None Help needed walking in hospital room?: None Help needed climbing 3-5 steps with a railing? : A Little 6 Click Score: 23    End of Session   Activity Tolerance: Patient tolerated treatment well Patient left: in bed;with call bell/phone within reach Nurse Communication: Mobility status PT Visit Diagnosis: Unsteadiness on feet (R26.81);Muscle weakness (generalized) (M62.81)     Time: 3143-8887 PT Time Calculation (min) (ACUTE ONLY): 17 min  Charges:  $Gait Training: 8-22 mins                    G Codes:       [facility] PT [place] 02/08/2018, 12:50 PM

## 2018-02-09 ENCOUNTER — Telehealth: Payer: Self-pay

## 2018-02-09 ENCOUNTER — Encounter (HOSPITAL_COMMUNITY): Payer: Self-pay | Admitting: Emergency Medicine

## 2018-02-09 ENCOUNTER — Inpatient Hospital Stay (HOSPITAL_COMMUNITY)
Admission: EM | Admit: 2018-02-09 | Discharge: 2018-02-12 | DRG: 288 | Disposition: A | Discharge status: Home / Self Care | Payer: Medicaid Other | Attending: Family Medicine | Admitting: Family Medicine

## 2018-02-09 ENCOUNTER — Emergency Department (HOSPITAL_COMMUNITY): Payer: Medicaid Other

## 2018-02-09 DIAGNOSIS — R0602 Shortness of breath: Secondary | ICD-10-CM | POA: Diagnosis present

## 2018-02-09 DIAGNOSIS — I39 Endocarditis and heart valve disorders in diseases classified elsewhere: Secondary | ICD-10-CM

## 2018-02-09 DIAGNOSIS — R0600 Dyspnea, unspecified: Secondary | ICD-10-CM

## 2018-02-09 DIAGNOSIS — Z8679 Personal history of other diseases of the circulatory system: Secondary | ICD-10-CM

## 2018-02-09 DIAGNOSIS — L8990 Pressure ulcer of unspecified site, unspecified stage: Secondary | ICD-10-CM | POA: Diagnosis present

## 2018-02-09 DIAGNOSIS — F1110 Opioid abuse, uncomplicated: Secondary | ICD-10-CM | POA: Diagnosis present

## 2018-02-09 DIAGNOSIS — Z79899 Other long term (current) drug therapy: Secondary | ICD-10-CM

## 2018-02-09 DIAGNOSIS — Z720 Tobacco use: Secondary | ICD-10-CM | POA: Diagnosis present

## 2018-02-09 DIAGNOSIS — L89153 Pressure ulcer of sacral region, stage 3: Secondary | ICD-10-CM | POA: Diagnosis present

## 2018-02-09 DIAGNOSIS — F17200 Nicotine dependence, unspecified, uncomplicated: Secondary | ICD-10-CM | POA: Diagnosis present

## 2018-02-09 DIAGNOSIS — F32A Depression, unspecified: Secondary | ICD-10-CM | POA: Diagnosis present

## 2018-02-09 DIAGNOSIS — F329 Major depressive disorder, single episode, unspecified: Secondary | ICD-10-CM | POA: Diagnosis present

## 2018-02-09 DIAGNOSIS — B955 Unspecified streptococcus as the cause of diseases classified elsewhere: Secondary | ICD-10-CM | POA: Diagnosis present

## 2018-02-09 DIAGNOSIS — I059 Rheumatic mitral valve disease, unspecified: Secondary | ICD-10-CM | POA: Diagnosis present

## 2018-02-09 DIAGNOSIS — D638 Anemia in other chronic diseases classified elsewhere: Secondary | ICD-10-CM | POA: Diagnosis present

## 2018-02-09 DIAGNOSIS — B182 Chronic viral hepatitis C: Secondary | ICD-10-CM | POA: Diagnosis present

## 2018-02-09 DIAGNOSIS — R7989 Other specified abnormal findings of blood chemistry: Secondary | ICD-10-CM | POA: Diagnosis present

## 2018-02-09 DIAGNOSIS — J189 Pneumonia, unspecified organism: Secondary | ICD-10-CM

## 2018-02-09 DIAGNOSIS — E440 Moderate protein-calorie malnutrition: Secondary | ICD-10-CM | POA: Diagnosis present

## 2018-02-09 DIAGNOSIS — F1910 Other psychoactive substance abuse, uncomplicated: Secondary | ICD-10-CM | POA: Diagnosis present

## 2018-02-09 DIAGNOSIS — Z590 Homelessness: Secondary | ICD-10-CM

## 2018-02-09 DIAGNOSIS — I330 Acute and subacute infective endocarditis: Principal | ICD-10-CM | POA: Diagnosis present

## 2018-02-09 DIAGNOSIS — I6340 Cerebral infarction due to embolism of unspecified cerebral artery: Secondary | ICD-10-CM | POA: Diagnosis present

## 2018-02-09 DIAGNOSIS — I058 Other rheumatic mitral valve diseases: Secondary | ICD-10-CM | POA: Insufficient documentation

## 2018-02-09 DIAGNOSIS — B181 Chronic viral hepatitis B without delta-agent: Secondary | ICD-10-CM | POA: Diagnosis present

## 2018-02-09 LAB — CBC WITH DIFFERENTIAL/PLATELET
Abs Immature Granulocytes: 0 10*3/uL (ref 0.0–0.1)
Basophils Absolute: 0 10*3/uL (ref 0.0–0.1)
Basophils Relative: 0 %
Eosinophils Absolute: 0 10*3/uL (ref 0.0–0.7)
Eosinophils Relative: 0 %
HCT: 39.5 % (ref 39.0–52.0)
Hemoglobin: 11.8 g/dL — ABNORMAL LOW (ref 13.0–17.0)
Immature Granulocytes: 0 %
Lymphocytes Relative: 20 %
Lymphs Abs: 1.5 10*3/uL (ref 0.7–4.0)
MCH: 27.4 pg (ref 26.0–34.0)
MCHC: 29.9 g/dL — ABNORMAL LOW (ref 30.0–36.0)
MCV: 91.6 fL (ref 78.0–100.0)
Monocytes Absolute: 0.7 10*3/uL (ref 0.1–1.0)
Monocytes Relative: 8 %
Neutro Abs: 5.5 10*3/uL (ref 1.7–7.7)
Neutrophils Relative %: 72 %
Platelets: 395 10*3/uL (ref 150–400)
RBC: 4.31 MIL/uL (ref 4.22–5.81)
RDW: 17.9 % — ABNORMAL HIGH (ref 11.5–15.5)
WBC: 7.8 10*3/uL (ref 4.0–10.5)

## 2018-02-09 LAB — CBC
HEMATOCRIT: 34.6 % — AB (ref 39.0–52.0)
HEMOGLOBIN: 10.5 g/dL — AB (ref 13.0–17.0)
MCH: 27.5 pg (ref 26.0–34.0)
MCHC: 30.3 g/dL (ref 30.0–36.0)
MCV: 90.6 fL (ref 78.0–100.0)
Platelets: 289 10*3/uL (ref 150–400)
RBC: 3.82 MIL/uL — ABNORMAL LOW (ref 4.22–5.81)
RDW: 17.6 % — ABNORMAL HIGH (ref 11.5–15.5)
WBC: 4.6 10*3/uL (ref 4.0–10.5)

## 2018-02-09 LAB — I-STAT CG4 LACTIC ACID, ED: Lactic Acid, Venous: 3.09 mmol/L (ref 0.5–1.9)

## 2018-02-09 LAB — BASIC METABOLIC PANEL
ANION GAP: 10 (ref 5–15)
Anion gap: 9 (ref 5–15)
BUN: 9 mg/dL (ref 6–20)
BUN: 13 mg/dL (ref 6–20)
CALCIUM: 9.7 mg/dL (ref 8.9–10.3)
CHLORIDE: 103 mmol/L (ref 101–111)
CO2: 25 mmol/L (ref 22–32)
CO2: 25 mmol/L (ref 22–32)
CREATININE: 0.62 mg/dL (ref 0.61–1.24)
CREATININE: 0.89 mg/dL (ref 0.61–1.24)
Calcium: 9.1 mg/dL (ref 8.9–10.3)
Chloride: 101 mmol/L (ref 101–111)
GFR calc non Af Amer: >60 mL/min (ref >60)
Glucose, Bld: 96 mg/dL (ref 65–99)
Glucose, Bld: 140 mg/dL — ABNORMAL HIGH (ref 65–99)
Potassium: 3.8 mmol/L (ref 3.5–5.1)
Potassium: 3.9 mmol/L (ref 3.5–5.1)
SODIUM: 136 mmol/L (ref 135–145)
Sodium: 137 mmol/L (ref 135–145)

## 2018-02-09 LAB — I-STAT TROPONIN, ED: TROPONIN I, POC: 0.01 ng/mL (ref 0.00–0.08)

## 2018-02-09 LAB — URINALYSIS, ROUTINE W REFLEX MICROSCOPIC
Glucose, UA: NEGATIVE mg/dL
Hgb urine dipstick: NEGATIVE
Ketones, ur: 5 mg/dL — AB
Leukocytes, UA: NEGATIVE
Nitrite: NEGATIVE
Protein, ur: 30 mg/dL — AB
Specific Gravity, Urine: 1.028 (ref 1.005–1.030)
pH: 5 (ref 5.0–8.0)

## 2018-02-09 MED ORDER — BUPROPION HCL ER (XL) 150 MG PO TB24
150.0000 mg | ORAL_TABLET | Freq: Every day | ORAL | 1 refills | Status: AC
Start: 2018-02-09 — End: ?

## 2018-02-09 MED ORDER — AMOXICILLIN 500 MG PO CAPS: 1000.0000 mg | ORAL_CAPSULE | Freq: Two times a day (BID) | ORAL | 5 refills | Status: AC

## 2018-02-09 MED ORDER — BUPRENORPHINE HCL-NALOXONE HCL 8-2 MG SL SUBL: 1.0000 | SUBLINGUAL_TABLET | Freq: Every day | SUBLINGUAL | 0 refills | Status: AC

## 2018-02-09 NOTE — Progress Notes (Signed)
Patient taken off the unit on a wheelchair with his personal belongings by Caralyn GuileSandra RN.

## 2018-02-09 NOTE — Progress Notes (Signed)
   I came back to re-evaluate Marcus [name] today as he is planning for discharge and wants to follow up with Marcus [name] for medication assisted therapy of opioid use disorder. He has done well inpatient with Suboxone 8mg  daily. He wants to continue as an outpatient and is currently working through issues of financial coverage, it seems he has no insurance to help pay for medications, also is unhoused. I gave him a paper prescription for generic suboxone, I would like him to use 16mg  daily as I anticipate this extra stress of being discharged will exacerbate his cravings. I gave him instructions about the [facility] being the most affordable out of pocket cost, around $25 for a week supply. [name] one [facility] for follow up. I hope he is able to follow up with Marcus [name] and continue treatment.

## 2018-02-09 NOTE — Telephone Encounter (Signed)
Spoke w/ dr Oswaldo Donevincent, called kristi back, lm for her rtc to triage for scheduling pt oud appt 6/18 at 1045

## 2018-02-09 NOTE — Discharge Summary (Signed)
Physician Discharge Summary   Patient ID: Marcus ReidDana [name] MRN: [id number] DOB/AGE: [birth year] 41 y.o.  Admit date: 01/14/2018 Discharge date: 02/09/2018  Primary Care Physician:  [facility]   Recommendations for Outpatient Follow-up:  1. 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 Bee Venom Anaphylaxis  . Food Hives and Other (See Comments)    grapefruit  . Tylenol [Acetaminophen] Other (See Comments)    Pt states that he is not supposed to take this medication because of his Hep C.      DISCHARGE MEDICATIONS: Allergies as of 02/09/2018      Reactions   Bee Venom Anaphylaxis   Food Hives, Other (See Comments)   grapefruit   Tylenol [acetaminophen] Other (See Comments)   Pt states that he is not supposed to take this medication because of his Hep C.       Medication List    STOP taking these medications   levofloxacin 750 MG tablet Commonly known as:  LEVAQUIN   warfarin 5 MG tablet Commonly known as:  COUMADIN     TAKE these medications   amoxicillin 500 MG capsule Commonly known as:  AMOXIL Take 2 capsules (1,000 mg total) by mouth 2 (two) times daily. X 6 MONTHS   buprenorphine-naloxone 8-2 mg Subl SL tablet Commonly known as:  SUBOXONE Place 1 tablet under the tongue daily for 7 days. Further refill by IM suboxone  clinic follow-up   buPROPion 150 MG 24 hr tablet Commonly known as:  WELLBUTRIN XL Take 1 tablet (150 mg total) by mouth daily.   MELATONIN PO Take 2 tablets by mouth daily as needed (Sleep).        Brief H and P: For complete details please refer to admission H and P, but in briefDana Barhamis a41 y.o.malewith a history of polysubtstance abuse, homelessness, chronic Hep B and C, depression who [facility] medical advice while receiving treatment for S. pneumoniae endocarditis with subsequent aortic septic thrombus. The patient had a vegetation on the posterior leaflet of his mitral valve, and was found to have a subtotal occlusion of his aorta at the level of the IMA, as well as splenic and renal infarcts, treated with coumadin and antibiotics. [facility] with complaints of chest pain, back pain and found to be encephalopathic. 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 ID was consulted, patient placed on penicillin G. Blood cultures repeated at admission here remained negative. Coumadin was stopped due to ICH. TTE showed a 1.4 cm mobile vegetation on the posterior leaflet on the MV with severe regurgitation, though he's had no evidence of heart failure. Vascular srugery also consulted for distal aortoiliac occlusion, felt the patient could not undergo repair as there are no non-prosthetic options and this is contraindicated due to infection. Noted that distal pulses with monophasic  with intact motor and sensation to both feet suggesting that these were chronic findings. TCTS was consulted due to 1.4cm MV vegetation despite 3 weeks IV antibiotics, they felt he was not a candidate for surgery, noting that surgery for this would be contraindicated prior to definitive management of more imminently life-threatening abdominal aortic occlusion with likely  mycotic aneurysm. Palliative care was recommended and has followed with the patient, though he seems to have very few social supports.  ID recommended continue IV antibiotics x6 weeksthrough 6/11, then oral amoxicillin minimum for 6 months.  Patient will be seen in ID clinic on 6/18 by Dr. Luciana Axe.  Hospital Course:  Streptococcus pneumonia mitral valve endocarditis, complicated by multiple septic emboli to kidneys, spleen, brain  -Blood cultures on admission were negative.  ID was consulted  -ID recommended IV penicillin for 6 weeks till 6/11, then indefinite amoxicillin.  Patient given prescription for 6 months of amoxicillin.  He will be followed by Dr. Luciana Axe in the ID clinic on 6/18 and will be recommended any change in the dosage or the duration any longer. 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 -Palliative medicine was consulted  Polysubstance abuse, opioid use disorder Continue Suboxone, Dr. Oswaldo Done (IM) has seen the patient.  Patient was given the prescription for 7 days of Suboxone and case management will try to get an appointment in the Suboxone clinic with internal medicine prior to discharge.  Depression Continue Wellbutrin  Tobacco use Continue nicotine gum  Chronic hepatitis B and C -Outpatient follow-up with ID scheduled on 6/18  MCA CVA/ICH, M2 occlusion -Cerebral angiogram showed no cerebral aneurysm, no further  work-up per neurology, follow-up outpatient in 4 weeks with stroke clinic  Thrombocytosis, leukocytosis Resolved  Anemia of chronic disease -Hemoglobin stable 10.5 at the time of discharge  Moderate malnutrition Continue nutritional supplements  Sacral wound, unstageable -Wound care consulted, recommended frequent turning, repositioning to the patient   Social issues Patient has been estranged from his family.  Social work consult was placed as he needs support.  He was given the resources for shelters.  Requested case management consult as well.  Day of Discharge S: Worried about the social issues otherwise no acute complaints  BP 110/66 (BP Location: Left Arm)   Pulse 86   Temp 98.5 F (36.9 C) (Oral)   Resp 16   Ht 5\' 11"  (1.803 m)   Wt 61.1 kg (134 lb 12.8 oz)   SpO2 98%   BMI 18.80 kg/m   Physical Exam: General: Alert and awake oriented x3 not in any acute distress. HEENT: anicteric sclera, pupils reactive to light and accommodation CVS: S1-S2 clear no murmur rubs or gallops Chest: clear to auscultation bilaterally, no wheezing rales or rhonchi Abdomen: soft nontender, nondistended, normal bowel sounds Extremities: no cyanosis, clubbing or edema noted bilaterally Neuro: Cranial nerves II-XII intact, no focal neurological deficits   The results of significant diagnostics from this hospitalization (including imaging, microbiology, ancillary and laboratory) are listed below for reference.  Procedures/Studies: Study Conclusions  - Left ventricle: The cavity size was severely dilated. Systolic   function was normal. The estimated ejection fraction was in the   range of 55% to 60%. Wall motion was normal; there were no   regional wall motion abnormalities. Left ventricular diastolic   function parameters were normal. - Aortic valve: Transvalvular velocity was within the normal range.   There was no stenosis. There was no regurgitation. - Mitral valve:  There was a medium-sized, 1.4 cm (L) x 0.4 cm (W),   mobile vegetation on the posterior leaflet. There was severe   regurgitation directed posteriorly. - Left atrium: The atrium was severely dilated. - Right ventricle: The cavity size was normal. Wall thickness was   normal. Systolic function was normal. - Tricuspid valve: There was mild regurgitation. - Pulmonary arteries: Systolic pressure was mildly increased. PA   peak pressure: 42 mm Hg (S).   Dg Chest 2 View  Result Date: 01/14/2018 CLINICAL DATA:  Left chest pain EXAM: CHEST - 2 VIEW COMPARISON:  01/13/2018 FINDINGS: Heart and mediastinal contours are within normal limits. No focal opacities or effusions. No acute bony abnormality. IMPRESSION: No active cardiopulmonary disease. Electronically Signed   By: Charlett Nose M.D.   On: 01/14/2018 08:29   Dg Chest 2 View  Result Date: 01/13/2018 CLINICAL DATA:  Weakness, hungry, pain.  History of hepatitis-C. EXAM: CHEST - 2 VIEW COMPARISON:  None. FINDINGS: The heart size and mediastinal contours are within normal limits. Both lungs are clear. The visualized skeletal structures are unremarkable. IMPRESSION: Negative. Electronically Signed   By: Awilda Metro M.D.   On: 01/13/2018 19:46   Ct Head Wo Contrast  Result Date: 01/24/2018 CLINICAL DATA:  Continued surveillance infarcts and hemorrhage. EXAM: CT HEAD WITHOUT CONTRAST TECHNIQUE: Contiguous axial images were obtained from the base of the skull through the vertex without intravenous contrast. COMPARISON:  CT head 01/14/2018. MR head 01/14/2018. CT angiogram 01/18/2018. FINDINGS: Brain: Previously identified subacute infarcts are involving into areas of chronic ischemia affecting the BILATERAL RIGHT greater than LEFT subcortical white matter. Tiny focus of hyperattenuation, RIGHT frontal cortex, image 23 appears improved. This measures no more than 2 x 3 mm on today's exam. Vascular: Improved appearance of the previously identified  RIGHT M2 occlusion. Some hyperattenuation of vessel wall in the sylvian fissure, see series 3, image 12. Skull: Normal. Negative for fracture or focal lesion. Sinuses/Orbits: No acute finding. Other: None. IMPRESSION: Previously identified subacute white matter infarcts are have evolved into areas of chronic ischemia. Tiny focus of hyperattenuation RIGHT frontal cortex is improved. Improved appearance of the previously identified RIGHT M2 occlusion. Electronically Signed   By: Elsie Stain M.D.   On: 01/24/2018 17:38   Ct Head Wo Contrast  Addendum Date: 01/14/2018   ADDENDUM REPORT: 01/14/2018 10:35 ADDENDUM: Study discussed by telephone with Dr. Shaune Pollack on 01/14/2018 at 1015 hours. 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 THEREFORE, the appearance is highly likely to reflect SEPTIC EMBOLI, with acute embolic related hemorrhage in the right superior frontal lobe. Electronically Signed   By: Odessa Fleming M.D.   On: 01/14/2018 10:35   Result Date: 01/14/2018 CLINICAL DATA:  42 year old male with altered level consciousness, lightheadedness and dizzy onset about 1 hour ago. EXAM: CT HEAD WITHOUT CONTRAST TECHNIQUE: Contiguous axial images were obtained from the base of the skull through the vertex without intravenous contrast. COMPARISON:  None. FINDINGS: Brain: Cytotoxic edema at the right anterior insula and frontal operculum.  No associated hemorrhage or mass effect in those segments. Small amorphous 7 millimeter focus of hyperdensity at the right superior frontal gyrus on series 3, image 25 and sagittal image 26. On coronal images there is adjacent hypodensity (coronal image 34). No regional mass effect. No other cytotoxic edema identified. ASPECTS 7 (abnormal insula, M4, M 5). More chronic appearing left corona radiata white matter hypodensity on the left. No intracranial mass effect or midline shift. No ventriculomegaly. Normal basilar cisterns. Vascular: Abnormal cylindrical hyperdensity at  the right MCA proximal M2 segment with Vessel expansion (series 3, image 14 and series 6, image 21). Skull: No acute osseous abnormality identified. Sinuses/Orbits: Visualized paranasal sinuses and mastoids are well pneumatized. Other: Mildly Disconjugate gaze, otherwise negative orbits soft tissues. No acute scalp soft tissue findings, scattered benign appearing scalp dermal nodularity. IMPRESSION: 1. 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 2. Possible trace focus of acute hemorrhage in the right superior frontal gyrus on series 3, image 25. No other acute intracranial hemorrhage identified. 3. Right MCA territory cytotoxic edema, ASPECTS 7 (although note suspicion of trace acute hemorrhage in #2). 4. Chronic appearing left MCA white matter disease. Electronically Signed: By: Odessa Fleming M.D. On: 01/14/2018 10:18   Ct Angio Abdomen W &/or Wo Contrast  Result Date: 01/21/2018 CLINICAL DATA:  Aorta occlusion EXAM: CT ANGIOGRAPHY ABDOMEN TECHNIQUE: Multidetector CT imaging of the abdomen was performed using the standard protocol during bolus administration of intravenous contrast. Multiplanar reconstructed images and MIPs were obtained and reviewed to evaluate the vascular anatomy. CONTRAST:  ISOVUE-370 IOPAMIDOL (ISOVUE-370) INJECTION 76% COMPARISON:  12/29/2017 at [facility], report only FINDINGS: VASCULAR Aorta: The aorta is abruptly occluded just below the takeoff of the IMA. The borders of the abdominal aorta are poorly defined at the level of the occlusion. Mild aneurysmal dilatation of the distal aorta measuring up to 3.7 cm is suspected. At the level of the occlusion, there is mixed low-density and isodensity within the thrombus, as well as a lack of atherosclerotic calcification, suggesting subacute rather than acute age. There is also a mild thickening of the wall of the aorta with stranding in the adjacent fat. These findings suggest an inflammatory process.  Celiac: Patent and ectatic. There is abrupt occlusion of the splenic artery, 2 cm from the hilum of the spleen. Distal branches reconstitute. The hepatic artery and its branches are grossly patent. Gastroduodenal artery is patent. SMA: Patent and ectatic. Renals: 2 right renal arteries and a single left renal artery are patent. IMA: Patent. Inflow: Bilateral common iliac arteries are occluded. There is some filling of the mid and distal left common iliac artery with contrast flow. The left common iliac artery measures 1.9 cm in maximal caliber. Right common iliac artery measures 3.6 cm in caliber. The left internal and external iliac arteries reconstitute and are partially imaged. Veins: No evidence of DVT. Review of the MIP images confirms the above findings. NON-VASCULAR Lower chest: No acute abnormality. Hepatobiliary: Unremarkable Pancreas: Unremarkable Spleen: There is a large nonspecific there is a complex cystic lesion in the spleen measuring up to 11.6 cm. Adrenals/Urinary Tract: There is scarring in the lower pole of the left kidney associated with wedge-shaped areas of low density. These findings likely represent small infarcts. This is also present in the upper pole of the right kidney to a lesser degree. Stomach/Bowel: No obvious mass in the colon. No evidence of small-bowel obstruction. Lymphatic: Small para-aortic lymph nodes are present. Other:  No free fluid.  Postoperative changes from appendectomy. Musculoskeletal: No vertebral compression deformity. Bilateral L5 pars defects. No evidence of anterolisthesis. IMPRESSION: VASCULAR There is abrupt occlusion of the aorta below the level of the IMA as described on the prior report. Mixed density within the aorta suggest subacute age. Also, there is thickening and inflammatory change of the wall of the aorta suggesting vasculitis or mycotic aneurysm. There is also associated occlusion of the bilateral common iliac arteries. Focal occlusion of the splenic  artery. NON-VASCULAR Complex cystic lesion in the spleen is nonspecific. Small infarcts in scarring in the kidneys bilaterally. Electronically Signed   By: Jolaine Click M.D.   On: 01/21/2018 12:05   Mr Maxine Glenn Head Wo Contrast  Result Date: 01/14/2018 CLINICAL DATA:  In hospital for sepsis, altered mental status. History of hepatitis-C, polysubstance abuse and endocarditis. History of head injury 1 month ago. Follow-up RIGHT MCA stroke. EXAM: MRI HEAD WITHOUT CONTRAST MRA HEAD WITHOUT CONTRAST TECHNIQUE: Multiplanar, multiecho pulse sequences of the brain and surrounding structures were obtained without intravenous contrast. Angiographic images of the head were obtained using MRA technique without contrast. COMPARISON:  CT HEAD Jan 14, 2018 at 0951 hours. FINDINGS: MRI HEAD FINDINGS Multiple sequences are mildly or moderately motion degraded. INTRACRANIAL CONTENTS: Patchy reduced diffusion RIGHT frontal lobe and RIGHT basal ganglia, LEFT posterior frontal lobe with normalized ADC values and focal T2 shine through. Associated FLAIR T2 hyperintense signal and faint hemosiderin staining. Slightly expanded FLAIR T2 hyperintense RIGHT basal ganglia. Superimposed focal susceptibility artifact bifrontal lobes, RIGHT parietooccipital lobe. Multiple additional scattered micro hemorrhages. Mild parenchymal brain volume loss. No hydrocephalus. Old small RIGHT cerebellar infarcts. VASCULAR: Susceptibility artifact and T1 shortening RIGHT sylvian fissure corresponding to MCA density. SKULL AND UPPER CERVICAL SPINE: No abnormal sellar expansion. No suspicious calvarial bone marrow signal. Craniocervical junction maintained. SINUSES/ORBITS: The mastoid air-cells and included paranasal sinuses are well-aerated.The included ocular globes and orbital contents are non-suspicious. OTHER: None. MRA HEAD FINDINGS ANTERIOR CIRCULATION: Normal flow related enhancement of the included cervical, petrous, cavernous and supraclinoid internal  carotid arteries. Patent anterior communicating artery. Mildly attenuated RIGHT MCA flow related enhancement, occluded RIGHT M2 segment with transient aneurysmal reconstitution. No flow limiting stenosis. POSTERIOR CIRCULATION: vertebral artery is dominant. Basilar artery is patent, with normal flow related enhancement of the main branch vessels. Patent posterior cerebral arteries. Robust RIGHT and smaller LEFT posterior communicating arteries present. No large vessel occlusion, flow limiting stenosis,  aneurysm. ANATOMIC VARIANTS: None. Source images and MIP images were reviewed. IMPRESSION: MRI HEAD: 1. Multifocal subacute bilateral MCA/watershed territory infarcts. 2. Peripheral susceptibility artifact, septic emboli suspected. There may be superimposed shear injury or underlying chronic hypertension. 3. Mild parenchymal brain volume loss for age. 4. Old small RIGHT cerebellar infarcts. MRA HEAD: 1. 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 2. Otherwise negative MRA head. Examination reviewed by Dr. Augusto Gamble with Dr. Wilford Corner, Neurology on Jan 14, 2018 at approximately 1425 hours. Electronically Signed   By: Awilda Metro M.D.   On: 01/14/2018 14:38   Mr Brain Wo Contrast  Result Date: 01/14/2018 CLINICAL DATA:  In hospital for sepsis, altered mental status. History of hepatitis-C, polysubstance abuse and endocarditis. History of head injury 1 month ago. Follow-up RIGHT MCA stroke. EXAM: MRI HEAD WITHOUT CONTRAST MRA HEAD WITHOUT CONTRAST TECHNIQUE: Multiplanar, multiecho pulse sequences of the brain and surrounding structures were obtained without intravenous contrast. Angiographic images of the head were obtained using MRA  technique without contrast. COMPARISON:  CT HEAD Jan 14, 2018 at 0951 hours. FINDINGS: MRI HEAD FINDINGS Multiple sequences are mildly or moderately motion degraded. INTRACRANIAL CONTENTS:  Patchy reduced diffusion RIGHT frontal lobe and RIGHT basal ganglia, LEFT posterior frontal lobe with normalized ADC values and focal T2 shine through. Associated FLAIR T2 hyperintense signal and faint hemosiderin staining. Slightly expanded FLAIR T2 hyperintense RIGHT basal ganglia. Superimposed focal susceptibility artifact bifrontal lobes, RIGHT parietooccipital lobe. Multiple additional scattered micro hemorrhages. Mild parenchymal brain volume loss. No hydrocephalus. Old small RIGHT cerebellar infarcts. VASCULAR: Susceptibility artifact and T1 shortening RIGHT sylvian fissure corresponding to MCA density. SKULL AND UPPER CERVICAL SPINE: No abnormal sellar expansion. No suspicious calvarial bone marrow signal. Craniocervical junction maintained. SINUSES/ORBITS: The mastoid air-cells and included paranasal sinuses are well-aerated.The included ocular globes and orbital contents are non-suspicious. OTHER: None. MRA HEAD FINDINGS ANTERIOR CIRCULATION: Normal flow related enhancement of the included cervical, petrous, cavernous and supraclinoid internal carotid arteries. Patent anterior communicating artery. Mildly attenuated RIGHT MCA flow related enhancement, occluded RIGHT M2 segment with transient aneurysmal reconstitution. No flow limiting stenosis. POSTERIOR CIRCULATION: vertebral artery is dominant. Basilar artery is patent, with normal flow related enhancement of the main branch vessels. Patent posterior cerebral arteries. Robust RIGHT and smaller LEFT posterior communicating arteries present. No large vessel occlusion, flow limiting stenosis,  aneurysm. ANATOMIC VARIANTS: None. Source images and MIP images were reviewed. IMPRESSION: MRI HEAD: 1. Multifocal subacute bilateral MCA/watershed territory infarcts. 2. Peripheral susceptibility artifact, septic emboli suspected. There may be superimposed shear injury or underlying chronic hypertension. 3. Mild parenchymal brain volume loss for age. 4. Old small  RIGHT cerebellar infarcts. MRA HEAD: 1. 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 2. Otherwise negative MRA head. Examination reviewed by Dr. Augusto Gamble with Dr. Wilford Corner, Neurology on Jan 14, 2018 at approximately 1425 hours. Electronically Signed   By: Awilda Metro M.D.   On: 01/14/2018 14:38   Dg Chest Port 1 View  Result Date: 01/15/2018 CLINICAL DATA:  Shortness of breath.  Acute mental status change. EXAM: PORTABLE CHEST 1 VIEW COMPARISON:  Jan 14, 2018 FINDINGS: No pneumothorax. Vascular crowding in the medial right lung base. No suspicious infiltrate. Mild atelectasis in the left base. The cardiomediastinal silhouette is stable. No other changes. IMPRESSION: Mild atelectasis in the left base.  No other significant change. Electronically Signed   By: Gerome Sam III M.D   On: 01/15/2018 07:40   Ir Angio Intra Extracran Sel Com Carotid Innominate Bilat Mod Sed  Result Date: 01/19/2018 CLINICAL DATA:  Focal right frontal hemorrhage. 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 EXAM: IR ANGIO VERTEBRAL SEL VERTEBRAL UNI RIGHT MOD SED; IR ULTRASOUND GUIDANCE VASC ACCESS RIGHT; IR ANGIO VERTEBRAL SEL SUBCLAVIAN INNOMINATE UNI LEFT MOD SED; BILATERAL COMMON CAROTID AND INNOMINATE ANGIOGRAPHY COMPARISON:  MRI MRA of the brain of 01/14/2018. MEDICATIONS: Heparin 0 units IV; no antibiotic was administered within 1 hour of the procedure. ANESTHESIA/SEDATION: Versed 0.5 mg IV; Fentanyl 37.5 mcg IV. Moderate Sedation Time:  75 minutes. The patient was continuously monitored during the procedure by the interventional radiology nurse under my direct supervision. CONTRAST:  Isovue 300 approximately 60 mL. FLUOROSCOPY TIME:  Fluoroscopy Time: 8 minutes 42 seconds (822 mGy). COMPLICATIONS: None immediate. TECHNIQUE: Informed written consent was obtained from the patient after a thorough discussion of  the procedural risks, benefits and alternatives. All questions were addressed. Maximal Sterile Barrier Technique was utilized  including caps, mask, sterile gowns, sterile gloves, sterile drape, hand hygiene and skin antiseptic. A timeout was performed prior to the initiation of the procedure. The right antecubital fossa was prepped and draped in the usual sterile fashion. Thereafter using modified Seldinger technique, transbrachial access into the right brachial artery was obtained without difficulty. Over a 0.035 inch guidewire, a 5 French Pinnacle sheath was inserted. Through this, and also over 0.035 inch guidewire, a 5 French Simmons 2 catheter was advanced to the aortic arch region and formed without difficulty. It was selectively positioned in the right common carotid artery, the right vertebral artery, the left common carotid artery and the left subclavian artery. FINDINGS: The right vertebral artery origin is widely patent. The vessel is seen to opacify normally to the cranial skull base. Wide patency is seen of the right vertebrobasilar junction and the right posterior-inferior cerebellar artery. The opacified portion of the basilar artery, the posterior cerebral arteries, the superior cerebellar arteries and the anterior-inferior cerebellar arteries appear normal into the capillary and venous phases. Non-opacified blood is seen in the basilar artery from contralateral vertebral artery. The left common carotid arteriogram demonstrates the left external carotid artery and its major branches to be widely patent. The left internal carotid artery at the bulb to the cranial skull base is widely patent. There is mild tortuosity in the proximal 1/3 of the left internal carotid artery. The petrous, cavernous and the supraclinoid segments are widely patent. The left middle cerebral artery and the left anterior cerebral artery opacify normally into the capillary and venous phases. The venous phase demonstrates  retrograde opacification of the left transverse sinus in its proximal 2/3 primarily from the ipsilateral vein of Labbe. The right common carotid arteriogram demonstrates the right external carotid artery and its major branches to be widely patent. The right internal carotid artery at the bulb to the cranial skull base is widely patent. There is a double U shaped tortuosity of the middle 1/3 of the right internal carotid artery without evidence of kinking. More distally, the right internal carotid artery is seen to opacify normally to the cranial skull base. The distal petrous, and the proximal cavernous segments demonstrate mild fusiform dilatation. Distal to this the supraclinoid right ICA is normally patent. The right middle cerebral artery and the right anterior cerebral artery demonstrate normal opacification into the capillary and venous phases. The right transverse sinus consistently demonstrates focal areas of moderate irregularity with questionable stenosis at its distal aspect. The left vertebral artery origin demonstrates mild stenosis. The vessel is seen to opacify normally to the cranial skull base. Wide patency is seen of the left vertebrobasilar junction and the left posterior-inferior cerebellar artery. The opacified portion of the distal left vertebrobasilar junction, the basilar artery, the posterior cerebral arteries, the superior cerebellar arteries and the anterior-cerebellar arteries is grossly patent into the delayed arterial phase. IMPRESSION: Angiographically no evidence of intracranial aneurysms, arteriovenous malformations, dural AV fistula, dissections, or of intraluminal filling defects. Venous outflow demonstrates areas of caliber irregularity involving the right transverse sinus with suspicion of stenosis. However, patient motion precludes fine detail in this region. PLAN: As per referring physician. Electronically Signed   By: Julieanne Cotton M.D.   On: 01/18/2018 13:10   Ir Angio  Vertebral Sel Subclavian Innominate Uni L Mod Sed  Result Date: 01/19/2018 CLINICAL DATA:  Focal right frontal hemorrhage. 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 EXAM: IR ANGIO VERTEBRAL SEL VERTEBRAL UNI RIGHT MOD SED; IR ULTRASOUND  GUIDANCE VASC ACCESS RIGHT; IR ANGIO VERTEBRAL SEL SUBCLAVIAN INNOMINATE UNI LEFT MOD SED; BILATERAL COMMON CAROTID AND INNOMINATE ANGIOGRAPHY COMPARISON:  MRI MRA of the brain of 01/14/2018. MEDICATIONS: Heparin 0 units IV; no antibiotic was administered within 1 hour of the procedure. ANESTHESIA/SEDATION: Versed 0.5 mg IV; Fentanyl 37.5 mcg IV. Moderate Sedation Time:  75 minutes. The patient was continuously monitored during the procedure by the interventional radiology nurse under my direct supervision. CONTRAST:  Isovue 300 approximately 60 mL. FLUOROSCOPY TIME:  Fluoroscopy Time: 8 minutes 42 seconds (822 mGy). COMPLICATIONS: None immediate. TECHNIQUE: Informed written consent was obtained from the patient after a thorough discussion of the procedural risks, benefits and alternatives. All questions were addressed. Maximal Sterile Barrier Technique was utilized including caps, mask, sterile gowns, sterile gloves, sterile drape, hand hygiene and skin antiseptic. A timeout was performed prior to the initiation of the procedure. The right antecubital fossa was prepped and draped in the usual sterile fashion. Thereafter using modified Seldinger technique, transbrachial access into the right brachial artery was obtained without difficulty. Over a 0.035 inch guidewire, a 5 French Pinnacle sheath was inserted. Through this, and also over 0.035 inch guidewire, a 5 French Simmons 2 catheter was advanced to the aortic arch region and formed without difficulty. It was selectively positioned in the right common carotid artery, the right vertebral artery, the left common carotid artery and the left subclavian artery. FINDINGS: The right vertebral artery origin is  widely patent. The vessel is seen to opacify normally to the cranial skull base. Wide patency is seen of the right vertebrobasilar junction and the right posterior-inferior cerebellar artery. The opacified portion of the basilar artery, the posterior cerebral arteries, the superior cerebellar arteries and the anterior-inferior cerebellar arteries appear normal into the capillary and venous phases. Non-opacified blood is seen in the basilar artery from contralateral vertebral artery. The left common carotid arteriogram demonstrates the left external carotid artery and its major branches to be widely patent. The left internal carotid artery at the bulb to the cranial skull base is widely patent. There is mild tortuosity in the proximal 1/3 of the left internal carotid artery. The petrous, cavernous and the supraclinoid segments are widely patent. The left middle cerebral artery and the left anterior cerebral artery opacify normally into the capillary and venous phases. The venous phase demonstrates retrograde opacification of the left transverse sinus in its proximal 2/3 primarily from the ipsilateral vein of Labbe. The right common carotid arteriogram demonstrates the right external carotid artery and its major branches to be widely patent. The right internal carotid artery at the bulb to the cranial skull base is widely patent. There is a double U shaped tortuosity of the middle 1/3 of the right internal carotid artery without evidence of kinking. More distally, the right internal carotid artery is seen to opacify normally to the cranial skull base. The distal petrous, and the proximal cavernous segments demonstrate mild fusiform dilatation. Distal to this the supraclinoid right ICA is normally patent. The right middle cerebral artery and the right anterior cerebral artery demonstrate normal opacification into the capillary and venous phases. The right transverse sinus consistently demonstrates focal areas of  moderate irregularity with questionable stenosis at its distal aspect. The left vertebral artery origin demonstrates mild stenosis. The vessel is seen to opacify normally to the cranial skull base. Wide patency is seen of the left vertebrobasilar junction and the left posterior-inferior cerebellar artery. The opacified portion of the distal left vertebrobasilar junction, the basilar artery,  the posterior cerebral arteries, the superior cerebellar arteries and the anterior-cerebellar arteries is grossly patent into the delayed arterial phase. IMPRESSION: Angiographically no evidence of intracranial aneurysms, arteriovenous malformations, dural AV fistula, dissections, or of intraluminal filling defects. Venous outflow demonstrates areas of caliber irregularity involving the right transverse sinus with suspicion of stenosis. However, patient motion precludes fine detail in this region. PLAN: As per referring physician. Electronically Signed   By: Julieanne Cotton M.D.   On: 01/18/2018 13:10   Ir Angio Vertebral Sel Vertebral Uni R Mod Sed  Result Date: 01/19/2018 CLINICAL DATA:  Focal right frontal hemorrhage. 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 EXAM: IR ANGIO VERTEBRAL SEL VERTEBRAL UNI RIGHT MOD SED; IR ULTRASOUND GUIDANCE VASC ACCESS RIGHT; IR ANGIO VERTEBRAL SEL SUBCLAVIAN INNOMINATE UNI LEFT MOD SED; BILATERAL COMMON CAROTID AND INNOMINATE ANGIOGRAPHY COMPARISON:  MRI MRA of the brain of 01/14/2018. MEDICATIONS: Heparin 0 units IV; no antibiotic was administered within 1 hour of the procedure. ANESTHESIA/SEDATION: Versed 0.5 mg IV; Fentanyl 37.5 mcg IV. Moderate Sedation Time:  75 minutes. The patient was continuously monitored during the procedure by the interventional radiology nurse under my direct supervision. CONTRAST:  Isovue 300 approximately 60 mL. FLUOROSCOPY TIME:  Fluoroscopy Time: 8 minutes 42 seconds (822 mGy). COMPLICATIONS: None immediate. TECHNIQUE: Informed  written consent was obtained from the patient after a thorough discussion of the procedural risks, benefits and alternatives. All questions were addressed. Maximal Sterile Barrier Technique was utilized including caps, mask, sterile gowns, sterile gloves, sterile drape, hand hygiene and skin antiseptic. A timeout was performed prior to the initiation of the procedure. The right antecubital fossa was prepped and draped in the usual sterile fashion. Thereafter using modified Seldinger technique, transbrachial access into the right brachial artery was obtained without difficulty. Over a 0.035 inch guidewire, a 5 French Pinnacle sheath was inserted. Through this, and also over 0.035 inch guidewire, a 5 French Simmons 2 catheter was advanced to the aortic arch region and formed without difficulty. It was selectively positioned in the right common carotid artery, the right vertebral artery, the left common carotid artery and the left subclavian artery. FINDINGS: The right vertebral artery origin is widely patent. The vessel is seen to opacify normally to the cranial skull base. Wide patency is seen of the right vertebrobasilar junction and the right posterior-inferior cerebellar artery. The opacified portion of the basilar artery, the posterior cerebral arteries, the superior cerebellar arteries and the anterior-inferior cerebellar arteries appear normal into the capillary and venous phases. Non-opacified blood is seen in the basilar artery from contralateral vertebral artery. The left common carotid arteriogram demonstrates the left external carotid artery and its major branches to be widely patent. The left internal carotid artery at the bulb to the cranial skull base is widely patent. There is mild tortuosity in the proximal 1/3 of the left internal carotid artery. The petrous, cavernous and the supraclinoid segments are widely patent. The left middle cerebral artery and the left anterior cerebral artery opacify  normally into the capillary and venous phases. The venous phase demonstrates retrograde opacification of the left transverse sinus in its proximal 2/3 primarily from the ipsilateral vein of Labbe. The right common carotid arteriogram demonstrates the right external carotid artery and its major branches to be widely patent. The right internal carotid artery at the bulb to the cranial skull base is widely patent. There is a double U shaped tortuosity of the middle 1/3 of the right internal carotid artery without evidence  of kinking. More distally, the right internal carotid artery is seen to opacify normally to the cranial skull base. The distal petrous, and the proximal cavernous segments demonstrate mild fusiform dilatation. Distal to this the supraclinoid right ICA is normally patent. The right middle cerebral artery and the right anterior cerebral artery demonstrate normal opacification into the capillary and venous phases. The right transverse sinus consistently demonstrates focal areas of moderate irregularity with questionable stenosis at its distal aspect. The left vertebral artery origin demonstrates mild stenosis. The vessel is seen to opacify normally to the cranial skull base. Wide patency is seen of the left vertebrobasilar junction and the left posterior-inferior cerebellar artery. The opacified portion of the distal left vertebrobasilar junction, the basilar artery, the posterior cerebral arteries, the superior cerebellar arteries and the anterior-cerebellar arteries is grossly patent into the delayed arterial phase. IMPRESSION: Angiographically no evidence of intracranial aneurysms, arteriovenous malformations, dural AV fistula, dissections, or of intraluminal filling defects. Venous outflow demonstrates areas of caliber irregularity involving the right transverse sinus with suspicion of stenosis. However, patient motion precludes fine detail in this region. PLAN: As per referring physician.  Electronically Signed   By: Julieanne Cotton M.D.   On: 01/18/2018 13:10       LAB RESULTS: Basic Metabolic Panel: Recent Labs  Lab 02/05/18 0411 02/09/18 0437  NA 137 137  K 3.8 3.8  CL 104 103  CO2 28 25  GLUCOSE 111* 96  BUN 10 9  CREATININE 0.70 0.62  CALCIUM 9.0 9.1   Liver Function Tests: No results for input(s): AST, ALT, ALKPHOS, BILITOT, PROT, ALBUMIN in the last 168 hours. No results for input(s): LIPASE, AMYLASE in the last 168 hours. No results for input(s): AMMONIA in the last 168 hours. CBC: Recent Labs  Lab 02/05/18 0411 02/09/18 0437  WBC 5.6 4.6  HGB 10.1* 10.5*  HCT 33.9* 34.6*  MCV 90.4 90.6  PLT 338 289   Cardiac Enzymes: No results for input(s): CKTOTAL, CKMB, CKMBINDEX, TROPONINI in the last 168 hours. BNP: Invalid input(s): POCBNP CBG: No results for input(s): GLUCAP in the last 168 hours.    Disposition and Follow-up: Discharge Instructions    Diet - low sodium heart healthy   Complete by:  As directed    Increase activity slowly   Complete by:  As directed        DISPOSITION: Home  DISCHARGE FOLLOW-UP Follow-up Information    [facility]. Schedule an appointment as soon as possible for a visit in 4 week(s).   Specialty:  Radiology Contact information: [address] [place] [address] [phone]       Tyson Alias, MD. Schedule an appointment as soon as possible for a visit in 1 week(s).   Specialty:  Internal Medicine Why:  for suboxone refill Contact information: [address] [place] [address] [phone]        Gardiner Barefoot, MD Follow up on 02/16/2018.   Specialty:  Infectious Diseases Why:  AT 3:15pm Contact information: [address] [place] [address] [phone]            Time coordinating discharge:  45 mins   Signed:   Thad Ranger M.D. [facility] 02/09/2018, 10:21 AM Pager:  [phone]

## 2018-02-09 NOTE — ED Triage Notes (Signed)
Pt reports he was discharged today [facility], states has been SOB since he walked to the bus stop.  Pt reports surgery three weeks ago, several blood clots, hep C. Spoke to provider who indicated cardiac and sepsis workup.

## 2018-02-09 NOTE — Telephone Encounter (Signed)
Kristi ( Sports coachcase manager w/[facility] ) requesting OUD appt for pt. Please call back.

## 2018-02-09 NOTE — Progress Notes (Signed)
Patient in a stable condition, discharge education reviewed with patient he verbalized understanding, iv removed, prescription medications given to the patient by the case manager, patient belongings at bedside, list of homeless shelters given to the patient by social work for assistance with housing, two bus voucher given to the patient for transportation.

## 2018-02-09 NOTE — Care Management Note (Signed)
Copied from: Case Management Note Donn PieriniKristi Timira Bieda RN, BSN Unit 4E-Case Manager [phone]  Patient Details  Name: Marcus ReidDana [name] MRN: [id number] Date of Birth: 1976-01-07  Subjective/Objective:  Pt admitted with septic embolism, endocarditis, +vegetation. Hx IVDU                  Action/Plan: PTA Pt lived at home, will need CVTS consult for possible valve surgery- ID following for abx needs- CM and CSW to follow   Expected Discharge Date:  02/09/18               Expected Discharge Plan:  Homeless Shelter  In-House Referral:  Clinical Social Work  Discharge planning Services  CM Consult, Medication Assistance, MATCH Program, Follow-up appt scheduled  Post Acute Care Choice:    Choice offered to:     DME Arranged:    DME Agency:     HH Arranged:    HH Agency:     Status of Service:  In process, will continue to follow  If discussed at Long Length of Stay Meetings, dates discussed:    Discharge Disposition: homeless shelter   Additional Comments:  02/09/18- 1500- [name] RN, CM- pt for d/c today- CM will assist with Medication via MATCH program- copay cost with override- Suboxone is not covered due controlled status- have received approval to use petty cash fund to fill under IM cost- $25. [facility]- and have picked medications up for pt and delivered to bedside. Have also called [facility] with Dr. Oswaldo DoneVincent and made f/u appointment for June 18 at 10:30- (10:45)- instructions given to pt. DME order for RW and this has been delivered to room. Pt has list of shelters and has been instructed to call about space availability.   02/08/18- 1630- [name] RN, CM- spoke with pt along with CSW - Morrie Sheldonshley at the bedside for transition of care needs- pt is still asking about going to rehab- explained to pt that he is not a candidate for rehab as he is finishing his abx tx in the am and will no longer need rehab needs. Pt reminded that he needs to be  looking into the shelter list provided to him by CSW so that he might have a place to go tomorrow for discharge. CM reminded pt that his medications will be looked at for assistance needs once scripts are done by MD. CM and CSW will f/u in am for transition of care needs. Pt also requesting RW for discharge.   01/26/18 Patient with poor insight into prognosis, non compliance, poor self care, very high risk for poor compliance to DC plan that may include coumadin, INR checks, oral abx.  Seen by palliative today. Discussed w Dr Blake DivineAkula, will obtain psych consult to further evaluate capacity.  Per Pallaitive note NOK is mother.  Patient will require continued IV Abx. Unsure of exact end date.  Barriers to DC include IV Abx completion, housing at DC, medication assistance. CM and CSW will continue to follow.   Darrold SpanWebster, [name], RN 02/09/2018, 3:06 PM

## 2018-02-10 ENCOUNTER — Encounter (HOSPITAL_COMMUNITY): Payer: Self-pay | Admitting: Emergency Medicine

## 2018-02-10 ENCOUNTER — Emergency Department (HOSPITAL_COMMUNITY): Payer: Medicaid Other

## 2018-02-10 ENCOUNTER — Other Ambulatory Visit: Payer: Self-pay

## 2018-02-10 DIAGNOSIS — Z888 Allergy status to other drugs, medicaments and biological substances status: Secondary | ICD-10-CM

## 2018-02-10 DIAGNOSIS — E440 Moderate protein-calorie malnutrition: Secondary | ICD-10-CM

## 2018-02-10 DIAGNOSIS — I330 Acute and subacute infective endocarditis: Secondary | ICD-10-CM | POA: Diagnosis not present

## 2018-02-10 DIAGNOSIS — R5383 Other fatigue: Secondary | ICD-10-CM

## 2018-02-10 DIAGNOSIS — I63411 Cerebral infarction due to embolism of right middle cerebral artery: Secondary | ICD-10-CM

## 2018-02-10 DIAGNOSIS — Z590 Homelessness: Secondary | ICD-10-CM

## 2018-02-10 DIAGNOSIS — R7989 Other specified abnormal findings of blood chemistry: Secondary | ICD-10-CM

## 2018-02-10 DIAGNOSIS — L89153 Pressure ulcer of sacral region, stage 3: Secondary | ICD-10-CM

## 2018-02-10 DIAGNOSIS — I669 Occlusion and stenosis of unspecified cerebral artery: Secondary | ICD-10-CM

## 2018-02-10 DIAGNOSIS — I059 Rheumatic mitral valve disease, unspecified: Secondary | ICD-10-CM | POA: Diagnosis present

## 2018-02-10 DIAGNOSIS — R5381 Other malaise: Secondary | ICD-10-CM

## 2018-02-10 DIAGNOSIS — I39 Endocarditis and heart valve disorders in diseases classified elsewhere: Secondary | ICD-10-CM

## 2018-02-10 DIAGNOSIS — I739 Peripheral vascular disease, unspecified: Secondary | ICD-10-CM

## 2018-02-10 DIAGNOSIS — F329 Major depressive disorder, single episode, unspecified: Secondary | ICD-10-CM

## 2018-02-10 DIAGNOSIS — B953 Streptococcus pneumoniae as the cause of diseases classified elsewhere: Secondary | ICD-10-CM

## 2018-02-10 DIAGNOSIS — B955 Unspecified streptococcus as the cause of diseases classified elsewhere: Secondary | ICD-10-CM | POA: Diagnosis present

## 2018-02-10 DIAGNOSIS — R0602 Shortness of breath: Secondary | ICD-10-CM | POA: Diagnosis present

## 2018-02-10 DIAGNOSIS — N280 Ischemia and infarction of kidney: Secondary | ICD-10-CM

## 2018-02-10 DIAGNOSIS — Z79899 Other long term (current) drug therapy: Secondary | ICD-10-CM | POA: Diagnosis not present

## 2018-02-10 DIAGNOSIS — Z720 Tobacco use: Secondary | ICD-10-CM

## 2018-02-10 DIAGNOSIS — F1590 Other stimulant use, unspecified, uncomplicated: Secondary | ICD-10-CM

## 2018-02-10 DIAGNOSIS — R011 Cardiac murmur, unspecified: Secondary | ICD-10-CM

## 2018-02-10 DIAGNOSIS — I76 Septic arterial embolism: Secondary | ICD-10-CM

## 2018-02-10 DIAGNOSIS — F1910 Other psychoactive substance abuse, uncomplicated: Secondary | ICD-10-CM | POA: Diagnosis present

## 2018-02-10 DIAGNOSIS — Z91030 Bee allergy status: Secondary | ICD-10-CM

## 2018-02-10 DIAGNOSIS — Z91018 Allergy to other foods: Secondary | ICD-10-CM

## 2018-02-10 DIAGNOSIS — B182 Chronic viral hepatitis C: Secondary | ICD-10-CM | POA: Diagnosis present

## 2018-02-10 DIAGNOSIS — F1190 Opioid use, unspecified, uncomplicated: Secondary | ICD-10-CM

## 2018-02-10 DIAGNOSIS — D638 Anemia in other chronic diseases classified elsewhere: Secondary | ICD-10-CM | POA: Diagnosis present

## 2018-02-10 DIAGNOSIS — F17200 Nicotine dependence, unspecified, uncomplicated: Secondary | ICD-10-CM | POA: Diagnosis present

## 2018-02-10 DIAGNOSIS — I748 Embolism and thrombosis of other arteries: Secondary | ICD-10-CM

## 2018-02-10 DIAGNOSIS — B181 Chronic viral hepatitis B without delta-agent: Secondary | ICD-10-CM | POA: Diagnosis present

## 2018-02-10 DIAGNOSIS — F1110 Opioid abuse, uncomplicated: Secondary | ICD-10-CM | POA: Diagnosis present

## 2018-02-10 DIAGNOSIS — Z8679 Personal history of other diseases of the circulatory system: Secondary | ICD-10-CM | POA: Diagnosis not present

## 2018-02-10 DIAGNOSIS — I058 Other rheumatic mitral valve diseases: Secondary | ICD-10-CM

## 2018-02-10 LAB — LACTIC ACID, PLASMA
LACTIC ACID, VENOUS: 1.4 mmol/L (ref 0.5–1.9)
Lactic Acid, Venous: 0.8 mmol/L (ref 0.5–1.9)

## 2018-02-10 LAB — PROTIME-INR
INR: 1.47
PROTHROMBIN TIME: 17.7 s — AB (ref 11.4–15.2)

## 2018-02-10 LAB — I-STAT TROPONIN, ED: Troponin i, poc: 0 ng/mL (ref 0.00–0.08)

## 2018-02-10 LAB — PROCALCITONIN: PROCALCITONIN: 0.17 ng/mL

## 2018-02-10 LAB — RAPID URINE DRUG SCREEN, HOSP PERFORMED
AMPHETAMINES: NOT DETECTED
BENZODIAZEPINES: NOT DETECTED
Barbiturates: NOT DETECTED
COCAINE: NOT DETECTED
OPIATES: NOT DETECTED
Tetrahydrocannabinol: NOT DETECTED

## 2018-02-10 LAB — I-STAT CG4 LACTIC ACID, ED: Lactic Acid, Venous: 1.86 mmol/L (ref 0.5–1.9)

## 2018-02-10 MED ORDER — ONDANSETRON HCL 4 MG/2ML IJ SOLN: 4.0000 mg | Freq: Three times a day (TID) | INTRAMUSCULAR | Status: DC | PRN

## 2018-02-10 MED ORDER — BUPROPION HCL ER (XL) 150 MG PO TB24
150.0000 mg | ORAL_TABLET | Freq: Every day | ORAL | Status: DC
  Administered 2018-02-10 – 2018-02-12 (×3): 150 mg via ORAL
  Filled 2018-02-10 (×3): qty 1

## 2018-02-10 MED ORDER — VANCOMYCIN HCL IN DEXTROSE 1-5 GM/200ML-% IV SOLN
1000.0000 mg | Freq: Once | INTRAVENOUS | Status: DC
  Filled 2018-02-10: qty 200

## 2018-02-10 MED ORDER — IOPAMIDOL (ISOVUE-370) INJECTION 76%
100.0000 mL | Freq: Once | INTRAVENOUS | Status: AC | PRN
  Administered 2018-02-10: 100 mL via INTRAVENOUS

## 2018-02-10 MED ORDER — SODIUM CHLORIDE 0.9 % IV SOLN
INTRAVENOUS | Status: DC
  Administered 2018-02-10: 100 mL/h via INTRAVENOUS
  Administered 2018-02-10 – 2018-02-11 (×4): via INTRAVENOUS

## 2018-02-10 MED ORDER — SODIUM CHLORIDE 0.9 % IV BOLUS
1000.0000 mL | Freq: Once | INTRAVENOUS | Status: AC
  Administered 2018-02-10: 1000 mL via INTRAVENOUS

## 2018-02-10 MED ORDER — SODIUM CHLORIDE 0.9 % IV BOLUS
500.0000 mL | Freq: Once | INTRAVENOUS | Status: AC
  Administered 2018-02-10: 500 mL via INTRAVENOUS

## 2018-02-10 MED ORDER — IOPAMIDOL (ISOVUE-370) INJECTION 76%
INTRAVENOUS | Status: AC
  Filled 2018-02-10: qty 100

## 2018-02-10 MED ORDER — ZOLPIDEM TARTRATE 5 MG PO TABS
5.0000 mg | ORAL_TABLET | Freq: Every evening | ORAL | Status: DC | PRN
  Administered 2018-02-11: 5 mg via ORAL
  Filled 2018-02-10: qty 1

## 2018-02-10 MED ORDER — IBUPROFEN 200 MG PO TABS
400.0000 mg | ORAL_TABLET | Freq: Four times a day (QID) | ORAL | Status: DC | PRN
Start: 2018-02-10 — End: 2018-02-12
  Administered 2018-02-10 – 2018-02-11 (×2): 400 mg via ORAL
  Filled 2018-02-10: qty 2
  Filled 2018-02-10: qty 1

## 2018-02-10 MED ORDER — AMOXICILLIN 500 MG PO CAPS
1000.0000 mg | ORAL_CAPSULE | Freq: Two times a day (BID) | ORAL | Status: DC
  Administered 2018-02-10 – 2018-02-12 (×5): 1000 mg via ORAL
  Filled 2018-02-10 (×6): qty 2

## 2018-02-10 MED ORDER — PIPERACILLIN-TAZOBACTAM 3.375 G IVPB 30 MIN
3.3750 g | Freq: Once | INTRAVENOUS | Status: AC
  Administered 2018-02-10: 3.375 g via INTRAVENOUS
  Filled 2018-02-10: qty 50

## 2018-02-10 MED ORDER — NICOTINE 21 MG/24HR TD PT24
21.0000 mg | MEDICATED_PATCH | Freq: Every day | TRANSDERMAL | Status: DC
  Administered 2018-02-10 – 2018-02-12 (×3): 21 mg via TRANSDERMAL
  Filled 2018-02-10 (×4): qty 1

## 2018-02-10 MED ORDER — ENOXAPARIN SODIUM 40 MG/0.4ML ~~LOC~~ SOLN
40.0000 mg | Freq: Every day | SUBCUTANEOUS | Status: DC
  Administered 2018-02-10 – 2018-02-12 (×3): 40 mg via SUBCUTANEOUS
  Filled 2018-02-10 (×3): qty 0.4

## 2018-02-10 MED ORDER — BUPRENORPHINE HCL-NALOXONE HCL 8-2 MG SL SUBL
1.0000 | SUBLINGUAL_TABLET | Freq: Every day | SUBLINGUAL | Status: DC
  Administered 2018-02-10 – 2018-02-12 (×3): 1 via SUBLINGUAL
  Filled 2018-02-10 (×3): qty 1

## 2018-02-10 MED ORDER — ALBUTEROL SULFATE (2.5 MG/3ML) 0.083% IN NEBU: 2.5000 mg | INHALATION_SOLUTION | RESPIRATORY_TRACT | Status: DC | PRN

## 2018-02-10 MED ORDER — VANCOMYCIN HCL IN DEXTROSE 1-5 GM/200ML-% IV SOLN
1000.0000 mg | Freq: Once | INTRAVENOUS | Status: AC
  Administered 2018-02-10: 1000 mg via INTRAVENOUS
  Filled 2018-02-10: qty 200

## 2018-02-10 MED ORDER — DM-GUAIFENESIN ER 30-600 MG PO TB12: 1.0000 | ORAL_TABLET | Freq: Two times a day (BID) | ORAL | Status: DC | PRN

## 2018-02-10 MED ORDER — PIPERACILLIN-TAZOBACTAM 3.375 G IVPB 30 MIN
3.3750 g | Freq: Once | INTRAVENOUS | Status: DC
  Filled 2018-02-10: qty 50

## 2018-02-10 NOTE — ED Notes (Signed)
Regular diet dinner tray ordered 

## 2018-02-10 NOTE — ED Notes (Signed)
Lunch ordered for PT 

## 2018-02-10 NOTE — ED Provider Notes (Signed)
[facility] EMERGENCY DEPARTMENT Provider Note   CSN: [id number] Arrival date & time: 02/09/18  2132     History   Chief Complaint Chief Complaint  Patient presents with  . Shortness of Breath    HPI Marcus [name] is a 42 y.o. male.  The history is provided by the patient.  Shortness of Breath  This is a new problem. The average episode lasts 1 day. The problem occurs continuously.The current episode started yesterday. The problem has not changed since onset.Associated symptoms include leg swelling. Pertinent negatives include no fever, no headaches, no coryza, no rhinorrhea, no sore throat, no swollen glands, no ear pain, no neck pain, no cough, no sputum production, no hemoptysis, no wheezing, no PND, no orthopnea, no chest pain, no syncope, no vomiting, no abdominal pain, no rash, no leg pain and no claudication. It is unknown what precipitated the problem. He has tried nothing for the symptoms. He has had prior hospitalizations. He has had prior ED visits. He has had no prior ICU admissions. Associated medical issues do not include DVT.  Patient with h/o endocarditis and mycotic aneurysm who presents with shortness of breath and leg swelling following discharge.  Is not taking his antibiotics but took his suboxone.    Past Medical History:  Diagnosis Date  . Hepatitis B 12/29/2017   at [facility]  . Hepatitis C   . 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, occluded at IMA level  . 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 Polysubstance abuse (HCC) 02/10/2018  . Tobacco abuse 02/10/2018  . SOB (shortness of breath) 02/10/2018  . Anemia of chronic disease 02/10/2018  . Pressure injury of skin 02/08/2018  . Malnutrition of moderate degree 02/02/2018  . Depression   . Palliative care by  specialist   . Advance care planning   . Goals of care, counseling/discussion   . Aneurysm (HCC)   . Severe mitral regurgitation   . Chronic viral hepatitis B without delta-agent (HCC) 01/20/2018  . Chronic viral hepatitis C (HCC) 01/20/2018  . Back pain 01/20/2018  . Endocarditis of mitral valve   . Aortic occlusion (HCC)   . 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 Septic embolism (HCC) 01/14/2018  . Cerebral embolism with cerebral infarction 01/14/2018  . Severe episode of recurrent major depressive disorder, with psychotic features (HCC)   . Bipolar 1 disorder, mixed, moderate (HCC) 06/29/2017  . Polysubstance dependence including opioid type drug without complication, episodic abuse (HCC) 07/02/2016    Past Surgical History:  Procedure Laterality Date  . APPENDECTOMY    . IR ANGIO INTRA EXTRACRAN SEL COM CAROTID INNOMINATE BILAT MOD SED  01/18/2018  . IR ANGIO VERTEBRAL SEL SUBCLAVIAN INNOMINATE UNI L MOD SED  01/18/2018  . IR ANGIO VERTEBRAL SEL VERTEBRAL UNI R MOD SED  01/18/2018        Home Medications    Prior to Admission medications   Medication Sig Start Date End Date Taking? Authorizing Provider  amoxicillin (AMOXIL) 500 MG capsule Take 2 capsules (1,000 mg total) by mouth 2 (two) times daily. X 6 MONTHS 02/09/18   Rai, Delene Ruffini, MD  buprenorphine-naloxone (SUBOXONE) 8-2 mg SUBL SL tablet Place 1 tablet under the tongue daily for 7 days. Further refill by  IM suboxone clinic follow-up 02/09/18 02/16/18  Rai, Delene Ruffini, MD  buPROPion (WELLBUTRIN XL) 150 MG 24 hr tablet Take 1 tablet (150 mg total) by mouth daily. 02/09/18   Rai, Ripudeep K, MD  MELATONIN PO Take 2 tablets by mouth daily as needed (Sleep).    [provider]    Family History History reviewed. No pertinent family history.  Social History Social History   Tobacco Use  . Smoking status: Current Every Day Smoker  . Smokeless tobacco: Never Used  Substance Use  Topics  . Alcohol use: No  . Drug use: Yes    Frequency: 7.0 times per week    Types: Methamphetamines, Heroin    Comment: Daily use where possible     Allergies   Bee venom; Food; and Tylenol [acetaminophen]   Review of Systems Review of Systems  Constitutional: Negative for diaphoresis and fever.  HENT: Negative for ear pain, rhinorrhea and sore throat.   Respiratory: Positive for shortness of breath. Negative for cough, hemoptysis, sputum production and wheezing.   Cardiovascular: Positive for leg swelling. Negative for chest pain, palpitations, orthopnea, claudication, syncope and PND.  Gastrointestinal: Negative for abdominal pain and vomiting.  Musculoskeletal: Negative for neck pain.  Skin: Negative for rash.  Neurological: Negative for headaches.  All other systems reviewed and are negative.    Physical Exam Updated Vital Signs BP 101/67   Pulse (!) 107   Temp 97.7 F (36.5 C) (Oral)   Resp 12   Ht 5\' 11"  (1.803 m)   Wt 62.1 kg (137 lb)   SpO2 100%   BMI 19.11 kg/m   Physical Exam  Constitutional: He is oriented to person, place, and time. He appears cachectic. He has a sickly appearance.  HENT:  Head: Normocephalic.  Right Ear: External ear normal.  Mouth/Throat: No oropharyngeal exudate.  Eyes: Pupils are equal, round, and reactive to light. Conjunctivae are normal.  Neck: Normal range of motion. Neck supple. No JVD present.  Cardiovascular: Regular rhythm. Tachycardia present. Exam reveals no decreased pulses.  Murmur heard. Pulmonary/Chest: Effort normal and breath sounds normal. He has no wheezes.  Abdominal: Soft. Bowel sounds are normal. He exhibits no mass. There is no tenderness. There is no rebound and no guarding.  Musculoskeletal: He exhibits edema. He exhibits no tenderness or deformity.  Neurological: He is alert and oriented to person, place, and time. He displays normal reflexes.  Skin: Skin is warm and dry. Capillary refill takes less  than 2 seconds. He is not diaphoretic.  Psychiatric: He has a normal mood and affect.  Nursing note and vitals reviewed.    ED Treatments / Results  Labs (all labs ordered are listed, but only abnormal results are displayed) Results for orders placed or performed during the hospital encounter of 02/09/18  Basic metabolic panel  Result Value Ref Range   Sodium 136 135 - 145 mmol/L   Potassium 3.9 3.5 - 5.1 mmol/L   Chloride 101 101 - 111 mmol/L   CO2 25 22 - 32 mmol/L   Glucose, Bld 140 (H) 65 - 99 mg/dL   BUN 13 6 - 20 mg/dL   Creatinine, Ser 1.61 0.61 - 1.24 mg/dL   Calcium 9.7 8.9 - 09.6 mg/dL   GFR calc non Af Amer >60 >60 mL/min   GFR calc Af Amer >60 >60 mL/min   Anion gap 10 5 - 15  CBC with Differential  Result Value Ref Range   WBC 7.8 4.0 - 10.5 K/uL  RBC 4.31 4.22 - 5.81 MIL/uL   Hemoglobin 11.8 (L) 13.0 - 17.0 g/dL   HCT 16.1 09.6 - 04.5 %   MCV 91.6 78.0 - 100.0 fL   MCH 27.4 26.0 - 34.0 pg   MCHC 29.9 (L) 30.0 - 36.0 g/dL   RDW 40.9 (H) 81.1 - 91.4 %   Platelets 395 150 - 400 K/uL   Neutrophils Relative % 72 %   Neutro Abs 5.5 1.7 - 7.7 K/uL   Lymphocytes Relative 20 %   Lymphs Abs 1.5 0.7 - 4.0 K/uL   Monocytes Relative 8 %   Monocytes Absolute 0.7 0.1 - 1.0 K/uL   Eosinophils Relative 0 %   Eosinophils Absolute 0.0 0.0 - 0.7 K/uL   Basophils Relative 0 %   Basophils Absolute 0.0 0.0 - 0.1 K/uL   Immature Granulocytes 0 %   Abs Immature Granulocytes 0.0 0.0 - 0.1 K/uL  Urinalysis, Routine w reflex microscopic  Result Value Ref Range   Color, Urine AMBER (A) YELLOW   APPearance CLOUDY (A) CLEAR   Specific Gravity, Urine 1.028 1.005 - 1.030   pH 5.0 5.0 - 8.0   Glucose, UA NEGATIVE NEGATIVE mg/dL   Hgb urine dipstick NEGATIVE NEGATIVE   Bilirubin Urine SMALL (A) NEGATIVE   Ketones, ur 5 (A) NEGATIVE mg/dL   Protein, ur 30 (A) NEGATIVE mg/dL   Nitrite NEGATIVE NEGATIVE   Leukocytes, UA NEGATIVE NEGATIVE   RBC / HPF 11-20 0 - 5 RBC/hpf   WBC, UA  11-20 0 - 5 WBC/hpf   Bacteria, UA RARE (A) NONE SEEN   Squamous Epithelial / LPF 0-5 0 - 5   Mucus PRESENT    Hyaline Casts, UA PRESENT    Ca Oxalate Crys, UA PRESENT   I-stat troponin, ED  Result Value Ref Range   Troponin i, poc 0.01 0.00 - 0.08 ng/mL   Comment 3          I-Stat CG4 Lactic Acid, ED  Result Value Ref Range   Lactic Acid, Venous 3.09 (HH) 0.5 - 1.9 mmol/L   Comment NOTIFIED PHYSICIAN   I-Stat CG4 Lactic Acid, ED  Result Value Ref Range   Lactic Acid, Venous 1.86 0.5 - 1.9 mmol/L   Dg Chest 2 View  Result Date: 02/09/2018 CLINICAL DATA:  Shortness of breath.  Surgery 3 weeks ago. EXAM: CHEST - 2 VIEW COMPARISON:  Jan 15, 2018 FINDINGS: The heart size and mediastinal contours are within normal limits. Both lungs are clear. The visualized skeletal structures are unremarkable. IMPRESSION: No active cardiopulmonary disease. Electronically Signed   By: Gerome Sam III M.D   On: 02/09/2018 22:58   Dg Chest 2 View  Result Date: 01/14/2018 CLINICAL DATA:  Left chest pain EXAM: CHEST - 2 VIEW COMPARISON:  01/13/2018 FINDINGS: Heart and mediastinal contours are within normal limits. No focal opacities or effusions. No acute bony abnormality. IMPRESSION: No active cardiopulmonary disease. Electronically Signed   By: Charlett Nose M.D.   On: 01/14/2018 08:29   Dg Chest 2 View  Result Date: 01/13/2018 CLINICAL DATA:  Weakness, hungry, pain.  History of hepatitis-C. EXAM: CHEST - 2 VIEW COMPARISON:  None. FINDINGS: The heart size and mediastinal contours are within normal limits. Both lungs are clear. The visualized skeletal structures are unremarkable. IMPRESSION: Negative. Electronically Signed   By: Awilda Metro M.D.   On: 01/13/2018 19:46   Ct Head Wo Contrast  Result Date: 01/24/2018 CLINICAL DATA:  Continued surveillance infarcts and hemorrhage. EXAM:  CT HEAD WITHOUT CONTRAST TECHNIQUE: Contiguous axial images were obtained from the base of the skull through the  vertex without intravenous contrast. COMPARISON:  CT head 01/14/2018. MR head 01/14/2018. CT angiogram 01/18/2018. FINDINGS: Brain: Previously identified subacute infarcts are involving into areas of chronic ischemia affecting the BILATERAL RIGHT greater than LEFT subcortical white matter. Tiny focus of hyperattenuation, RIGHT frontal cortex, image 23 appears improved. This measures no more than 2 x 3 mm on today's exam. Vascular: Improved appearance of the previously identified RIGHT M2 occlusion. Some hyperattenuation of vessel wall in the sylvian fissure, see series 3, image 12. Skull: Normal. Negative for fracture or focal lesion. Sinuses/Orbits: No acute finding. Other: None. IMPRESSION: Previously identified subacute white matter infarcts are have evolved into areas of chronic ischemia. Tiny focus of hyperattenuation RIGHT frontal cortex is improved. Improved appearance of the previously identified RIGHT M2 occlusion. Electronically Signed   By: Elsie Stain M.D.   On: 01/24/2018 17:38   Ct Head Wo Contrast  Addendum Date: 01/14/2018   ADDENDUM REPORT: 01/14/2018 10:35 ADDENDUM: Study discussed by telephone with Dr. Shaune Pollack on 01/14/2018 at 1015 hours. 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 THEREFORE, the appearance is highly likely to reflect SEPTIC EMBOLI, with acute embolic related hemorrhage in the right superior frontal lobe. Electronically Signed   By: Odessa Fleming M.D.   On: 01/14/2018 10:35   Result Date: 01/14/2018 CLINICAL DATA:  42 year old male with altered level consciousness, lightheadedness and dizzy onset about 1 hour ago. EXAM: CT HEAD WITHOUT CONTRAST TECHNIQUE: Contiguous axial images were obtained from the base of the skull through the vertex without intravenous contrast. COMPARISON:  None. FINDINGS: Brain: Cytotoxic edema at the right anterior insula and frontal operculum. No associated hemorrhage or mass effect in those segments. Small amorphous 7 millimeter focus  of hyperdensity at the right superior frontal gyrus on series 3, image 25 and sagittal image 26. On coronal images there is adjacent hypodensity (coronal image 34). No regional mass effect. No other cytotoxic edema identified. ASPECTS 7 (abnormal insula, M4, M 5). More chronic appearing left corona radiata white matter hypodensity on the left. No intracranial mass effect or midline shift. No ventriculomegaly. Normal basilar cisterns. Vascular: Abnormal cylindrical hyperdensity at the right MCA proximal M2 segment with Vessel expansion (series 3, image 14 and series 6, image 21). Skull: No acute osseous abnormality identified. Sinuses/Orbits: Visualized paranasal sinuses and mastoids are well pneumatized. Other: Mildly Disconjugate gaze, otherwise negative orbits soft tissues. No acute scalp soft tissue findings, scattered benign appearing scalp dermal nodularity. IMPRESSION: 1. 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 2. Possible trace focus of acute hemorrhage in the right superior frontal gyrus on series 3, image 25. No other acute intracranial hemorrhage identified. 3. Right MCA territory cytotoxic edema, ASPECTS 7 (although note suspicion of trace acute hemorrhage in #2). 4. Chronic appearing left MCA white matter disease. Electronically Signed: By: Odessa Fleming M.D. On: 01/14/2018 10:18   Ct Angio Abdomen W &/or Wo Contrast  Result Date: 01/21/2018 CLINICAL DATA:  Aorta occlusion EXAM: CT ANGIOGRAPHY ABDOMEN TECHNIQUE: Multidetector CT imaging of the abdomen was performed using the standard protocol during bolus administration of intravenous contrast. Multiplanar reconstructed images and MIPs were obtained and reviewed to evaluate the vascular anatomy. CONTRAST:  ISOVUE-370 IOPAMIDOL (ISOVUE-370) INJECTION 76% COMPARISON:  12/29/2017 at [facility], report only FINDINGS: VASCULAR Aorta: The aorta is abruptly occluded just below the takeoff of the IMA.  The borders of  the abdominal aorta are poorly defined at the level of the occlusion. Mild aneurysmal dilatation of the distal aorta measuring up to 3.7 cm is suspected. At the level of the occlusion, there is mixed low-density and isodensity within the thrombus, as well as a lack of atherosclerotic calcification, suggesting subacute rather than acute age. There is also a mild thickening of the wall of the aorta with stranding in the adjacent fat. These findings suggest an inflammatory process. Celiac: Patent and ectatic. There is abrupt occlusion of the splenic artery, 2 cm from the hilum of the spleen. Distal branches reconstitute. The hepatic artery and its branches are grossly patent. Gastroduodenal artery is patent. SMA: Patent and ectatic. Renals: 2 right renal arteries and a single left renal artery are patent. IMA: Patent. Inflow: Bilateral common iliac arteries are occluded. There is some filling of the mid and distal left common iliac artery with contrast flow. The left common iliac artery measures 1.9 cm in maximal caliber. Right common iliac artery measures 3.6 cm in caliber. The left internal and external iliac arteries reconstitute and are partially imaged. Veins: No evidence of DVT. Review of the MIP images confirms the above findings. NON-VASCULAR Lower chest: No acute abnormality. Hepatobiliary: Unremarkable Pancreas: Unremarkable Spleen: There is a large nonspecific there is a complex cystic lesion in the spleen measuring up to 11.6 cm. Adrenals/Urinary Tract: There is scarring in the lower pole of the left kidney associated with wedge-shaped areas of low density. These findings likely represent small infarcts. This is also present in the upper pole of the right kidney to a lesser degree. Stomach/Bowel: No obvious mass in the colon. No evidence of small-bowel obstruction. Lymphatic: Small para-aortic lymph nodes are present. Other: No free fluid.  Postoperative changes from appendectomy. Musculoskeletal: No  vertebral compression deformity. Bilateral L5 pars defects. No evidence of anterolisthesis. IMPRESSION: VASCULAR There is abrupt occlusion of the aorta below the level of the IMA as described on the prior report. Mixed density within the aorta suggest subacute age. Also, there is thickening and inflammatory change of the wall of the aorta suggesting vasculitis or mycotic aneurysm. There is also associated occlusion of the bilateral common iliac arteries. Focal occlusion of the splenic artery. NON-VASCULAR Complex cystic lesion in the spleen is nonspecific. Small infarcts in scarring in the kidneys bilaterally. Electronically Signed   By: Jolaine Click M.D.   On: 01/21/2018 12:05   Mr Maxine Glenn Head Wo Contrast  Result Date: 01/14/2018 CLINICAL DATA:  In hospital for sepsis, altered mental status. History of hepatitis-C, polysubstance abuse and endocarditis. History of head injury 1 month ago. Follow-up RIGHT MCA stroke. EXAM: MRI HEAD WITHOUT CONTRAST MRA HEAD WITHOUT CONTRAST TECHNIQUE: Multiplanar, multiecho pulse sequences of the brain and surrounding structures were obtained without intravenous contrast. Angiographic images of the head were obtained using MRA technique without contrast. COMPARISON:  CT HEAD Jan 14, 2018 at 0951 hours. FINDINGS: MRI HEAD FINDINGS Multiple sequences are mildly or moderately motion degraded. INTRACRANIAL CONTENTS: Patchy reduced diffusion RIGHT frontal lobe and RIGHT basal ganglia, LEFT posterior frontal lobe with normalized ADC values and focal T2 shine through. Associated FLAIR T2 hyperintense signal and faint hemosiderin staining. Slightly expanded FLAIR T2 hyperintense RIGHT basal ganglia. Superimposed focal susceptibility artifact bifrontal lobes, RIGHT parietooccipital lobe. Multiple additional scattered micro hemorrhages. Mild parenchymal brain volume loss. No hydrocephalus. Old small RIGHT cerebellar infarcts. VASCULAR: Susceptibility artifact and T1 shortening RIGHT sylvian  fissure corresponding to MCA density. SKULL AND UPPER CERVICAL  SPINE: No abnormal sellar expansion. No suspicious calvarial bone marrow signal. Craniocervical junction maintained. SINUSES/ORBITS: The mastoid air-cells and included paranasal sinuses are well-aerated.The included ocular globes and orbital contents are non-suspicious. OTHER: None. MRA HEAD FINDINGS ANTERIOR CIRCULATION: Normal flow related enhancement of the included cervical, petrous, cavernous and supraclinoid internal carotid arteries. Patent anterior communicating artery. Mildly attenuated RIGHT MCA flow related enhancement, occluded RIGHT M2 segment with transient aneurysmal reconstitution. No flow limiting stenosis. POSTERIOR CIRCULATION: vertebral artery is dominant. Basilar artery is patent, with normal flow related enhancement of the main branch vessels. Patent posterior cerebral arteries. Robust RIGHT and smaller LEFT posterior communicating arteries present. No large vessel occlusion, flow limiting stenosis,  aneurysm. ANATOMIC VARIANTS: None. Source images and MIP images were reviewed. IMPRESSION: MRI HEAD: 1. Multifocal subacute bilateral MCA/watershed territory infarcts. 2. Peripheral susceptibility artifact, septic emboli suspected. There may be superimposed shear injury or underlying chronic hypertension. 3. Mild parenchymal brain volume loss for age. 4. Old small RIGHT cerebellar infarcts. MRA HEAD: 1. 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 2. Otherwise negative MRA head. Examination reviewed by Dr. Augusto Gamble with Dr. Wilford Corner, Neurology on Jan 14, 2018 at approximately 1425 hours. Electronically Signed   By: Awilda Metro M.D.   On: 01/14/2018 14:38   Mr Brain Wo Contrast  Result Date: 01/14/2018 CLINICAL DATA:  In hospital for sepsis, altered mental status. History of hepatitis-C, polysubstance abuse and endocarditis. History of  head injury 1 month ago. Follow-up RIGHT MCA stroke. EXAM: MRI HEAD WITHOUT CONTRAST MRA HEAD WITHOUT CONTRAST TECHNIQUE: Multiplanar, multiecho pulse sequences of the brain and surrounding structures were obtained without intravenous contrast. Angiographic images of the head were obtained using MRA technique without contrast. COMPARISON:  CT HEAD Jan 14, 2018 at 0951 hours. FINDINGS: MRI HEAD FINDINGS Multiple sequences are mildly or moderately motion degraded. INTRACRANIAL CONTENTS: Patchy reduced diffusion RIGHT frontal lobe and RIGHT basal ganglia, LEFT posterior frontal lobe with normalized ADC values and focal T2 shine through. Associated FLAIR T2 hyperintense signal and faint hemosiderin staining. Slightly expanded FLAIR T2 hyperintense RIGHT basal ganglia. Superimposed focal susceptibility artifact bifrontal lobes, RIGHT parietooccipital lobe. Multiple additional scattered micro hemorrhages. Mild parenchymal brain volume loss. No hydrocephalus. Old small RIGHT cerebellar infarcts. VASCULAR: Susceptibility artifact and T1 shortening RIGHT sylvian fissure corresponding to MCA density. SKULL AND UPPER CERVICAL SPINE: No abnormal sellar expansion. No suspicious calvarial bone marrow signal. Craniocervical junction maintained. SINUSES/ORBITS: The mastoid air-cells and included paranasal sinuses are well-aerated.The included ocular globes and orbital contents are non-suspicious. OTHER: None. MRA HEAD FINDINGS ANTERIOR CIRCULATION: Normal flow related enhancement of the included cervical, petrous, cavernous and supraclinoid internal carotid arteries. Patent anterior communicating artery. Mildly attenuated RIGHT MCA flow related enhancement, occluded RIGHT M2 segment with transient aneurysmal reconstitution. No flow limiting stenosis. POSTERIOR CIRCULATION: vertebral artery is dominant. Basilar artery is patent, with normal flow related enhancement of the main branch vessels. Patent posterior cerebral arteries.  Robust RIGHT and smaller LEFT posterior communicating arteries present. No large vessel occlusion, flow limiting stenosis,  aneurysm. ANATOMIC VARIANTS: None. Source images and MIP images were reviewed. IMPRESSION: MRI HEAD: 1. Multifocal subacute bilateral MCA/watershed territory infarcts. 2. Peripheral susceptibility artifact, septic emboli suspected. There may be superimposed shear injury or underlying chronic hypertension. 3. Mild parenchymal brain volume loss for age. 4. Old small RIGHT cerebellar infarcts. MRA HEAD: 1. Subacute thromboembolism resulting in RIGHT M2 occlusion, given aneurysmal appearance this is concerning for developing mycotic  aneurysm. 2. Otherwise negative MRA head. Examination reviewed by Dr. Augusto Gamble with Dr. Wilford Corner, Neurology on Jan 14, 2018 at approximately 1425 hours. Electronically Signed   By: Awilda Metro M.D.   On: 01/14/2018 14:38   Dg Chest Port 1 View  Result Date: 01/15/2018 CLINICAL DATA:  Shortness of breath.  Acute mental status change. EXAM: PORTABLE CHEST 1 VIEW COMPARISON:  Jan 14, 2018 FINDINGS: No pneumothorax. Vascular crowding in the medial right lung base. No suspicious infiltrate. Mild atelectasis in the left base. The cardiomediastinal silhouette is stable. No other changes. IMPRESSION: Mild atelectasis in the left base.  No other significant change. Electronically Signed   By: Gerome Sam III M.D   On: 01/15/2018 07:40   Ir Angio Intra Extracran Sel Com Carotid Innominate Bilat Mod Sed  Result Date: 01/19/2018 CLINICAL DATA:  Focal right frontal hemorrhage. 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 EXAM: IR ANGIO VERTEBRAL SEL VERTEBRAL UNI RIGHT MOD SED; IR ULTRASOUND GUIDANCE VASC ACCESS RIGHT; IR ANGIO VERTEBRAL SEL SUBCLAVIAN INNOMINATE UNI LEFT MOD SED; BILATERAL COMMON CAROTID AND INNOMINATE ANGIOGRAPHY COMPARISON:  MRI MRA of the brain of 01/14/2018. MEDICATIONS: Heparin 0 units IV; no antibiotic was administered  within 1 hour of the procedure. ANESTHESIA/SEDATION: Versed 0.5 mg IV; Fentanyl 37.5 mcg IV. Moderate Sedation Time:  75 minutes. The patient was continuously monitored during the procedure by the interventional radiology nurse under my direct supervision. CONTRAST:  Isovue 300 approximately 60 mL. FLUOROSCOPY TIME:  Fluoroscopy Time: 8 minutes 42 seconds (822 mGy). COMPLICATIONS: None immediate. TECHNIQUE: Informed written consent was obtained from the patient after a thorough discussion of the procedural risks, benefits and alternatives. All questions were addressed. Maximal Sterile Barrier Technique was utilized including caps, mask, sterile gowns, sterile gloves, sterile drape, hand hygiene and skin antiseptic. A timeout was performed prior to the initiation of the procedure. The right antecubital fossa was prepped and draped in the usual sterile fashion. Thereafter using modified Seldinger technique, transbrachial access into the right brachial artery was obtained without difficulty. Over a 0.035 inch guidewire, a 5 French Pinnacle sheath was inserted. Through this, and also over 0.035 inch guidewire, a 5 French Simmons 2 catheter was advanced to the aortic arch region and formed without difficulty. It was selectively positioned in the right common carotid artery, the right vertebral artery, the left common carotid artery and the left subclavian artery. FINDINGS: The right vertebral artery origin is widely patent. The vessel is seen to opacify normally to the cranial skull base. Wide patency is seen of the right vertebrobasilar junction and the right posterior-inferior cerebellar artery. The opacified portion of the basilar artery, the posterior cerebral arteries, the superior cerebellar arteries and the anterior-inferior cerebellar arteries appear normal into the capillary and venous phases. Non-opacified blood is seen in the basilar artery from contralateral vertebral artery. The left common carotid  arteriogram demonstrates the left external carotid artery and its major branches to be widely patent. The left internal carotid artery at the bulb to the cranial skull base is widely patent. There is mild tortuosity in the proximal 1/3 of the left internal carotid artery. The petrous, cavernous and the supraclinoid segments are widely patent. The left middle cerebral artery and the left anterior cerebral artery opacify normally into the capillary and venous phases. The venous phase demonstrates retrograde opacification of the left transverse sinus in its proximal 2/3 primarily from the ipsilateral vein of Labbe. The right common carotid arteriogram demonstrates the right external  carotid artery and its major branches to be widely patent. The right internal carotid artery at the bulb to the cranial skull base is widely patent. There is a double U shaped tortuosity of the middle 1/3 of the right internal carotid artery without evidence of kinking. More distally, the right internal carotid artery is seen to opacify normally to the cranial skull base. The distal petrous, and the proximal cavernous segments demonstrate mild fusiform dilatation. Distal to this the supraclinoid right ICA is normally patent. The right middle cerebral artery and the right anterior cerebral artery demonstrate normal opacification into the capillary and venous phases. The right transverse sinus consistently demonstrates focal areas of moderate irregularity with questionable stenosis at its distal aspect. The left vertebral artery origin demonstrates mild stenosis. The vessel is seen to opacify normally to the cranial skull base. Wide patency is seen of the left vertebrobasilar junction and the left posterior-inferior cerebellar artery. The opacified portion of the distal left vertebrobasilar junction, the basilar artery, the posterior cerebral arteries, the superior cerebellar arteries and the anterior-cerebellar arteries is grossly patent  into the delayed arterial phase. IMPRESSION: Angiographically no evidence of intracranial aneurysms, arteriovenous malformations, dural AV fistula, dissections, or of intraluminal filling defects. Venous outflow demonstrates areas of caliber irregularity involving the right transverse sinus with suspicion of stenosis. However, patient motion precludes fine detail in this region. PLAN: As per referring physician. Electronically Signed   By: Julieanne Cotton M.D.   On: 01/18/2018 13:10   Ir Angio Vertebral Sel Subclavian Innominate Uni L Mod Sed  Result Date: 01/19/2018 CLINICAL DATA:  Focal right frontal hemorrhage. 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 EXAM: IR ANGIO VERTEBRAL SEL VERTEBRAL UNI RIGHT MOD SED; IR ULTRASOUND GUIDANCE VASC ACCESS RIGHT; IR ANGIO VERTEBRAL SEL SUBCLAVIAN INNOMINATE UNI LEFT MOD SED; BILATERAL COMMON CAROTID AND INNOMINATE ANGIOGRAPHY COMPARISON:  MRI MRA of the brain of 01/14/2018. MEDICATIONS: Heparin 0 units IV; no antibiotic was administered within 1 hour of the procedure. ANESTHESIA/SEDATION: Versed 0.5 mg IV; Fentanyl 37.5 mcg IV. Moderate Sedation Time:  75 minutes. The patient was continuously monitored during the procedure by the interventional radiology nurse under my direct supervision. CONTRAST:  Isovue 300 approximately 60 mL. FLUOROSCOPY TIME:  Fluoroscopy Time: 8 minutes 42 seconds (822 mGy). COMPLICATIONS: None immediate. TECHNIQUE: Informed written consent was obtained from the patient after a thorough discussion of the procedural risks, benefits and alternatives. All questions were addressed. Maximal Sterile Barrier Technique was utilized including caps, mask, sterile gowns, sterile gloves, sterile drape, hand hygiene and skin antiseptic. A timeout was performed prior to the initiation of the procedure. The right antecubital fossa was prepped and draped in the usual sterile fashion. Thereafter using modified Seldinger technique,  transbrachial access into the right brachial artery was obtained without difficulty. Over a 0.035 inch guidewire, a 5 French Pinnacle sheath was inserted. Through this, and also over 0.035 inch guidewire, a 5 French Simmons 2 catheter was advanced to the aortic arch region and formed without difficulty. It was selectively positioned in the right common carotid artery, the right vertebral artery, the left common carotid artery and the left subclavian artery. FINDINGS: The right vertebral artery origin is widely patent. The vessel is seen to opacify normally to the cranial skull base. Wide patency is seen of the right vertebrobasilar junction and the right posterior-inferior cerebellar artery. The opacified portion of the basilar artery, the posterior cerebral arteries, the superior cerebellar arteries and the anterior-inferior cerebellar arteries appear normal  into the capillary and venous phases. Non-opacified blood is seen in the basilar artery from contralateral vertebral artery. The left common carotid arteriogram demonstrates the left external carotid artery and its major branches to be widely patent. The left internal carotid artery at the bulb to the cranial skull base is widely patent. There is mild tortuosity in the proximal 1/3 of the left internal carotid artery. The petrous, cavernous and the supraclinoid segments are widely patent. The left middle cerebral artery and the left anterior cerebral artery opacify normally into the capillary and venous phases. The venous phase demonstrates retrograde opacification of the left transverse sinus in its proximal 2/3 primarily from the ipsilateral vein of Labbe. The right common carotid arteriogram demonstrates the right external carotid artery and its major branches to be widely patent. The right internal carotid artery at the bulb to the cranial skull base is widely patent. There is a double U shaped tortuosity of the middle 1/3 of the right internal carotid  artery without evidence of kinking. More distally, the right internal carotid artery is seen to opacify normally to the cranial skull base. The distal petrous, and the proximal cavernous segments demonstrate mild fusiform dilatation. Distal to this the supraclinoid right ICA is normally patent. The right middle cerebral artery and the right anterior cerebral artery demonstrate normal opacification into the capillary and venous phases. The right transverse sinus consistently demonstrates focal areas of moderate irregularity with questionable stenosis at its distal aspect. The left vertebral artery origin demonstrates mild stenosis. The vessel is seen to opacify normally to the cranial skull base. Wide patency is seen of the left vertebrobasilar junction and the left posterior-inferior cerebellar artery. The opacified portion of the distal left vertebrobasilar junction, the basilar artery, the posterior cerebral arteries, the superior cerebellar arteries and the anterior-cerebellar arteries is grossly patent into the delayed arterial phase. IMPRESSION: Angiographically no evidence of intracranial aneurysms, arteriovenous malformations, dural AV fistula, dissections, or of intraluminal filling defects. Venous outflow demonstrates areas of caliber irregularity involving the right transverse sinus with suspicion of stenosis. However, patient motion precludes fine detail in this region. PLAN: As per referring physician. Electronically Signed   By: Julieanne Cotton M.D.   On: 01/18/2018 13:10   Ir Angio Vertebral Sel Vertebral Uni R Mod Sed  Result Date: 01/19/2018 CLINICAL DATA:  Focal right frontal hemorrhage. 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 EXAM: IR ANGIO VERTEBRAL SEL VERTEBRAL UNI RIGHT MOD SED; IR ULTRASOUND GUIDANCE VASC ACCESS RIGHT; IR ANGIO VERTEBRAL SEL SUBCLAVIAN INNOMINATE UNI LEFT MOD SED; BILATERAL COMMON CAROTID AND INNOMINATE ANGIOGRAPHY COMPARISON:  MRI MRA of the  brain of 01/14/2018. MEDICATIONS: Heparin 0 units IV; no antibiotic was administered within 1 hour of the procedure. ANESTHESIA/SEDATION: Versed 0.5 mg IV; Fentanyl 37.5 mcg IV. Moderate Sedation Time:  75 minutes. The patient was continuously monitored during the procedure by the interventional radiology nurse under my direct supervision. CONTRAST:  Isovue 300 approximately 60 mL. FLUOROSCOPY TIME:  Fluoroscopy Time: 8 minutes 42 seconds (822 mGy). COMPLICATIONS: None immediate. TECHNIQUE: Informed written consent was obtained from the patient after a thorough discussion of the procedural risks, benefits and alternatives. All questions were addressed. Maximal Sterile Barrier Technique was utilized including caps, mask, sterile gowns, sterile gloves, sterile drape, hand hygiene and skin antiseptic. A timeout was performed prior to the initiation of the procedure. The right antecubital fossa was prepped and draped in the usual sterile fashion. Thereafter using modified Seldinger technique, transbrachial access into the  right brachial artery was obtained without difficulty. Over a 0.035 inch guidewire, a 5 French Pinnacle sheath was inserted. Through this, and also over 0.035 inch guidewire, a 5 French Simmons 2 catheter was advanced to the aortic arch region and formed without difficulty. It was selectively positioned in the right common carotid artery, the right vertebral artery, the left common carotid artery and the left subclavian artery. FINDINGS: The right vertebral artery origin is widely patent. The vessel is seen to opacify normally to the cranial skull base. Wide patency is seen of the right vertebrobasilar junction and the right posterior-inferior cerebellar artery. The opacified portion of the basilar artery, the posterior cerebral arteries, the superior cerebellar arteries and the anterior-inferior cerebellar arteries appear normal into the capillary and venous phases. Non-opacified blood is seen in the  basilar artery from contralateral vertebral artery. The left common carotid arteriogram demonstrates the left external carotid artery and its major branches to be widely patent. The left internal carotid artery at the bulb to the cranial skull base is widely patent. There is mild tortuosity in the proximal 1/3 of the left internal carotid artery. The petrous, cavernous and the supraclinoid segments are widely patent. The left middle cerebral artery and the left anterior cerebral artery opacify normally into the capillary and venous phases. The venous phase demonstrates retrograde opacification of the left transverse sinus in its proximal 2/3 primarily from the ipsilateral vein of Labbe. The right common carotid arteriogram demonstrates the right external carotid artery and its major branches to be widely patent. The right internal carotid artery at the bulb to the cranial skull base is widely patent. There is a double U shaped tortuosity of the middle 1/3 of the right internal carotid artery without evidence of kinking. More distally, the right internal carotid artery is seen to opacify normally to the cranial skull base. The distal petrous, and the proximal cavernous segments demonstrate mild fusiform dilatation. Distal to this the supraclinoid right ICA is normally patent. The right middle cerebral artery and the right anterior cerebral artery demonstrate normal opacification into the capillary and venous phases. The right transverse sinus consistently demonstrates focal areas of moderate irregularity with questionable stenosis at its distal aspect. The left vertebral artery origin demonstrates mild stenosis. The vessel is seen to opacify normally to the cranial skull base. Wide patency is seen of the left vertebrobasilar junction and the left posterior-inferior cerebellar artery. The opacified portion of the distal left vertebrobasilar junction, the basilar artery, the posterior cerebral arteries, the superior  cerebellar arteries and the anterior-cerebellar arteries is grossly patent into the delayed arterial phase. IMPRESSION: Angiographically no evidence of intracranial aneurysms, arteriovenous malformations, dural AV fistula, dissections, or of intraluminal filling defects. Venous outflow demonstrates areas of caliber irregularity involving the right transverse sinus with suspicion of stenosis. However, patient motion precludes fine detail in this region. PLAN: As per referring physician. Electronically Signed   By: Julieanne Cotton M.D.   On: 01/18/2018 13:10   EKG EKG Interpretation  Date/Time:  Tuesday February 09 2018 21:53:14 EDT Ventricular Rate:  128 PR Interval:  138 QRS Duration: 94 QT Interval:  322 QTC Calculation: 470 R Axis:   -31 Text Interpretation:  Sinus tachycardia Biatrial enlargement Left axis deviation Confirmed by Basilia Stuckert ([id number]) on 02/10/2018 1:27:07 AM   Radiology Dg Chest 2 View  Result Date: 02/09/2018 CLINICAL DATA:  Shortness of breath.  Surgery 3 weeks ago. EXAM: CHEST - 2 VIEW COMPARISON:  Jan 15, 2018 FINDINGS: The heart size  and mediastinal contours are within normal limits. Both lungs are clear. The visualized skeletal structures are unremarkable. IMPRESSION: No active cardiopulmonary disease. Electronically Signed   By: Gerome Samavid  Williams III M.D   On: 02/09/2018 22:58    Procedures Procedures (including critical care time)  Medications Ordered in ED Medications  vancomycin (VANCOCIN) IVPB 1000 mg/200 mL premix (has no administration in time range)  piperacillin-tazobactam (ZOSYN) IVPB 3.375 g (has no administration in time range)  sodium chloride 0.9 % bolus 1,000 mL (has no administration in time range)  sodium chloride 0.9 % bolus 1,000 mL (1,000 mLs Intravenous New Bag/Given 02/10/18 0155)      Final Clinical Impressions(s) / ED Diagnoses   Final diagnoses:  Dyspnea, unspecified type  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     Janasha Barkalow, MD 02/10/18 [phone]

## 2018-02-10 NOTE — ED Notes (Signed)
Report given to oncoming ED RN 

## 2018-02-10 NOTE — ED Notes (Signed)
Wound cleaned.. Allevyn dressing placed over sacral wound. Waiting on hydrocolloid dressing to be delivered.

## 2018-02-10 NOTE — ED Notes (Signed)
Pt. To CT via stretcher. 

## 2018-02-10 NOTE — ED Notes (Addendum)
Carollee HerterShannon, RN accepts report. PT may go to [id number]

## 2018-02-10 NOTE — Consult Note (Signed)
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  Was unstageable with thin adherent slough to wound bed.  Autolysis achieved with hydrocolloid dressing. Patient removed this yesterday after discharged.  Will reinstitute to keep wound bed moist and promote healing.  Wound type:pressure injury Pressure Injury POA: Yes Measurement: 1.2 cm x 0.4 cm x 0.2 cm  Wound BJY:NWGNbed:pink and moist Drainage (amount, consistency, odor) minimal serosanguinous  No odor Periwound: pink and newly epithelialized Dressing procedure/placement/frequency: Cleanse coccyx wound with soap and water.  Apply hydrocolloid dressing.  Change every three days and PRN soilage.  Will not follow at this time.  Please re-consult if needed.  Maple HudsonKaren Mehgan Santmyer RN BSN CWON Pager [phone]

## 2018-02-10 NOTE — ED Notes (Signed)
Dinner tray given - pt awake, alert, oriented x4 - no c/o pain or other discomfort at this time

## 2018-02-10 NOTE — Plan of Care (Signed)
Pt calm and appropriate at bedside. No signs of anxiety. VSS

## 2018-02-10 NOTE — ED Notes (Signed)
Accepting 5C nurse to call back for report

## 2018-02-10 NOTE — Consult Note (Signed)
[place] for Infectious Disease    Date of Admission:  02/09/2018     Total days of antibiotics 1 suppressive amoxicillin  Completed PCN infusion 6/11              Reason for Consult: shortness of breath after endocarditis treatment   Referring Provider: [place]  Primary Care Provider: none  Assessment: Marcus [name] is a 42 y.o. 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 This was complicated by kidney, spleen and CNS septic emboli. 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 He is not a surgical candidate for any intervention ([facility] TCTS and VVS teams agree on this.)   His exam is benign from pulmonary perspective - he has no shortness of breath at rest, adequate oxygenation on room air, no adventitious breath sounds and no demonstrated or reported cough. CT scan results noted with ground-glass appearance in the RML. Pro-calcitonin is reassuringly negative. I think his shortness of breath is multifactorial - deconditioning, lower functional capacity related to valvular dysfunction, claudication (he reports noticing leg cramping yesterday and swelling). He has had 2 lengthy hospital stays where he is now deconditioned. He agrees that he likely "over-did the walking" yesterday as he is not used to it. Would continue suppressive amoxicillin BID and continue to observe without escalating antibiotics at this time.    Plan: 1. Shortness of Breath = multifactorial. Observe on only suppressive amox.  2. 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 Continue suppressive amoxicillin.   3. Stage 3 Pressure Wound = [facility] team has seen him and recommended hydrocolloid dressing. His nutrition is poor. Discussed importance of offloading  from sacrum.   4. Homeless = reports homeless shelters are full in this area. He has no other plan for now.   5. Hep B/C = has outpatient appointment with Dr. Linus Salmons scheduled soon.   6. Opioid Use Disorder = continue Suboxone per primary team.    Principal Problem:   SOB (shortness of breath) Active Problems:   Endocarditis of mitral valve   Chronic viral hepatitis B without delta-agent (HCC)   Chronic viral hepatitis C ([place])   Cerebral embolism with cerebral infarction   Depression   Malnutrition of moderate degree   Pressure injury of skin   Polysubstance abuse (HCC)   Tobacco abuse   Anemia of chronic disease   Elevated lactic acid level   . amoxicillin  1,000 mg Oral BID  . buprenorphine-naloxone  1 tablet Sublingual Daily  . buPROPion  150 mg Oral Daily  . enoxaparin (LOVENOX) injection  40 mg Subcutaneous Daily  . iopamidol      . nicotine  21 mg Transdermal Daily    HPI: Marcus [name] is a 42 y.o. 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 He was discharged yesterday from [facility] after he completed 4 weeks of IV penicillin with recommendations to take amoxicillin BID hereafter. He tells me that when he was released he used the bus system to try to go to [employer] for money order - he missed the stop and had to walk over a quarter mile to  [employer] only to find he had to walk again a longer distance to another store as their system was down. He tells me he feels as if he did a lot more walking than he would have expected yesterday. No associated cough, fever, chills, chest pain. He describes an illness that included a cough "2-3 months ago" but nothing now. He noticed some more swelling of the LLE yesterday as well as intermittent cramping/tingling of legs. Spent some time calling local homeless shelters in an attempt to access  their services but he tells me they were all full and that because he does not have a [place] ID he would not be able to get in anyway.   Review of Systems: Review of Systems  Constitutional: Positive for malaise/fatigue. Negative for chills, fever and weight loss.  HENT: Negative for congestion, sinus pain and sore throat.   Respiratory: Positive for shortness of breath. Negative for cough, sputum production and wheezing.   Cardiovascular: Positive for claudication. Negative for chest pain, palpitations and leg swelling.  Gastrointestinal: Negative for abdominal pain, diarrhea and vomiting.  Genitourinary: Negative for dysuria and flank pain.  Musculoskeletal: Negative for joint pain, myalgias and neck pain.  Skin: Negative for rash.  Neurological: Negative for dizziness, tingling and headaches.  Psychiatric/Behavioral: Negative for depression and substance abuse. The patient is not nervous/anxious and does not have insomnia.     Past Medical History:  Diagnosis Date  . Hepatitis B 12/29/2017   at [facility]  . Hepatitis C   . 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 Streptococcal endocarditis 12/29/2017   at [facility]    Social History   Tobacco Use  . Smoking status: Current Every Day Smoker  . Smokeless tobacco: Never Used  Substance Use Topics  . Alcohol use: No  . Drug use: Yes    Frequency: 7.0 times per week    Types: Methamphetamines, Heroin    Comment: Daily use where possible    Family History  Problem Relation Age of Onset  . Multiple myeloma Mother    Allergies  Allergen Reactions  . Bee Venom Anaphylaxis  . Food Hives and Other (See Comments)    grapefruit  . Tylenol [Acetaminophen] Other (See Comments)    Pt states that he is not supposed to take this medication because of his Hep C.     OBJECTIVE: Blood pressure 98/67, pulse  89, temperature 98.7 F (37.1 C), temperature source Oral, resp. rate 16, height 5' 11" (1.803 m), weight 137 lb (62.1 kg), SpO2 100 %.  Physical Exam  Constitutional: He is oriented to person, place, and time.  Non-toxic appearance. He has a sickly appearance.  Malnourished   HENT:  Mouth/Throat: Oropharynx is clear and moist. No oral lesions. Normal dentition. No dental caries.  Eyes: Pupils are equal, round, and reactive to light. No scleral icterus.  Cardiovascular: Normal rate and regular rhythm.  Murmur (3/6 systolic murmur at apex ) heard. (+) abdominal bruit   Pulmonary/Chest: Effort normal and breath sounds normal. No accessory muscle usage. No tachypnea. No respiratory distress. He has no decreased breath sounds. He has no wheezes. He has no rhonchi. He has no rales.  Abdominal: Soft. Bowel sounds are normal. He exhibits no distension. There is no tenderness.  Musculoskeletal:       Right lower leg: He   exhibits no edema.       Left lower leg: He exhibits edema (1+).  Lymphadenopathy:    He has no cervical adenopathy.  Neurological: He is alert and oriented to person, place, and time.  Skin: Skin is warm and dry. No rash noted.  Psychiatric: He has a normal mood and affect.  Very talkative with me.   Nursing note and vitals reviewed.   Lab Results Lab Results  Component Value Date   WBC 7.8 02/09/2018   HGB 11.8 (L) 02/09/2018   HCT 39.5 02/09/2018   MCV 91.6 02/09/2018   PLT 395 02/09/2018    Lab Results  Component Value Date   CREATININE 0.89 02/09/2018   BUN 13 02/09/2018   NA 136 02/09/2018   K 3.9 02/09/2018   CL 101 02/09/2018   CO2 25 02/09/2018    Lab Results  Component Value Date   ALT 54 01/19/2018   AST 54 (H) 01/19/2018   ALKPHOS 72 01/19/2018   BILITOT 0.4 01/19/2018     Microbiology: No results found for this or any previous visit (from the past 240 hour(s)).  Janene Madeira, MSN, NP-C [facility] for Infectious [place] Cell: [phone] Pager: [phone]  02/10/2018 2:40 PM

## 2018-02-10 NOTE — ED Notes (Signed)
Attempted to call report. RN will call back. 

## 2018-02-10 NOTE — ED Notes (Signed)
Wound care supplies ordered. PT made aware.

## 2018-02-10 NOTE — Progress Notes (Signed)
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  Patient came back to hospital with worsening shortness of breath.  CTA showed no pulmonary embolism, showed multifocal groundglass opacity within the lung predominantly in the right middle lobe and lingula. Patient denies shortness of breath.  Vitals:   02/10/18 1115 02/10/18 1215  BP: 95/60 97/66  Pulse: (!) 59 74  Resp: 17 14  Temp:    SpO2: 99% 99%      A/P ? 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  Continue amoxicillin.  Will consult ID for further recommendations.   Meredeth IdeGagan S Raseel Jans [facility] [phone]- [phone]

## 2018-02-10 NOTE — H&P (Signed)
History and Physical    Marcus [name] TXM:[id number] DOB: 06-06-1976 DOA: 02/09/2018  Referring MD/NP/PA:   PCP: [facility]   Patient coming from:  The patient is coming from home.  At baseline, pt is independent for most of ADL.  Chief Complaint: SOB.  HPI: Marcus [name] is a 42 y.o. 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  Pt was recently hospitalized from 5/16-6/11 mainly due to streptococcus pneumonia mitral valve endocarditis, complicated by multiple septic emboli to kidneys, spleen, brain. 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 Pt was discharged at stable condition on indefinite amoxicillin. Pt states that he started having shortness of breath today.  He denies cough, chest pain, fever, chills.  He states that he is taking amoxicillin.  Denies nausea vomiting, diarrhea, abdominal pain.  No symptoms of UTI.  Not unilateral weakness.   ED Course: pt was found to have WBC 7.8, lactic acid 3.09, INR 1.47, negative troponin, negative urinalysis, electrolytes renal function okay, temperature normal, tachycardia, no tachypnea, oxygen saturation 100% on room air, negative chest x-ray.  Patient is admitted to telemetry bed as inpatient.  CTA showed: 1. No pulmonary embolus. 2. Multifocal ground-glass opacities within the lungs, predominantly in the right middle lobe and lingula.  3. Known splenic infarct. This is incompletely imaged on this study.  Review of Systems:   General: no fevers, chills, no body weight gain, has fatigue HEENT: no blurry vision, hearing changes or sore throat Respiratory: has dyspnea, no coughing, wheezing CV: no chest pain, no palpitations GI: no nausea, vomiting, abdominal pain, diarrhea, constipation GU: no dysuria, burning on  urination, increased urinary frequency, hematuria  Ext: no leg edema Neuro: no unilateral weakness, numbness, or tingling, no vision change or hearing loss Skin: has sacral ulcer MSK: No muscle spasm, no deformity, no limitation of range of movement in spin Heme: No easy bruising.  Travel history: No recent long distant travel.  Allergy:  Allergies  Allergen Reactions  . Bee Venom Anaphylaxis  . Food Hives and Other (See Comments)    grapefruit  . Tylenol [Acetaminophen] Other (See Comments)    Pt states that he is not supposed to take this medication because of his Hep C.     Past Medical History:  Diagnosis Date  . Hepatitis B 12/29/2017   at [facility]  . Hepatitis C   . Occluded aorta ([place]) 12/29/2017   at [facility], occluded at IMA level  . Renal infarction ([place]) 12/29/2017   bilateral, at [facility]  . 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 Streptococcal endocarditis 12/29/2017   at [facility]    Past Surgical History:  Procedure Laterality Date  . APPENDECTOMY    . IR ANGIO INTRA EXTRACRAN SEL COM CAROTID INNOMINATE BILAT MOD SED  01/18/2018  . IR ANGIO VERTEBRAL SEL SUBCLAVIAN INNOMINATE UNI L MOD SED  01/18/2018  . IR ANGIO VERTEBRAL SEL VERTEBRAL UNI R MOD SED  01/18/2018    Social History:  reports that he has been smoking.  He has never used smokeless tobacco. He reports that he has current or past drug history. Drugs: Methamphetamines and Heroin. Frequency: 7.00 times per week. He reports that he does not drink alcohol.  Family History:  Family History  Problem Relation Age of Onset  . Multiple myeloma Mother      Prior to  Admission medications   Medication Sig Start Date End Date Taking? Authorizing Provider  amoxicillin (AMOXIL) 500 MG capsule Take 2 capsules (1,000 mg total) by mouth 2 (two) times daily. X 6 MONTHS 02/09/18   Rai, Vernelle Emerald, MD  buprenorphine-naloxone (SUBOXONE) 8-2 mg SUBL SL tablet Place 1 tablet under the tongue daily for 7 days. Further  refill by IM suboxone clinic follow-up 02/09/18 02/16/18  Rai, Vernelle Emerald, MD  buPROPion (WELLBUTRIN XL) 150 MG 24 hr tablet Take 1 tablet (150 mg total) by mouth daily. 02/09/18   Rai, Ripudeep K, MD  MELATONIN PO Take 2 tablets by mouth daily as needed (Sleep).    [provider]    Physical Exam: Vitals:   02/10/18 0115 02/10/18 0349 02/10/18 0415 02/10/18 0500  BP: 101/67 90/60 98/63  99/60  Pulse: (!) 107 93 93 94  Resp: 12 15 14 13   Temp:      TempSrc:      SpO2: 100% 100% 100% 100%  Weight:      Height:       General: Not in acute distress HEENT:       Eyes: PERRL, EOMI, no scleral icterus.       ENT: No discharge from the ears and nose, no pharynx injection, no tonsillar enlargement.        Neck: No JVD, no bruit, no mass felt. Heme: No neck lymph node enlargement. Cardiac: S1/S2, RRR, No murmurs, No gallops or rubs. Respiratory: no rales, wheezing, rhonchi or rubs. GI: Soft, nondistended, nontender, no rebound pain, no organomegaly, BS present. GU: No hematuria Ext: No pitting leg edema bilaterally. 2+DP/PT pulse bilaterally. Musculoskeletal: No joint deformities, No joint redness or warmth, no limitation of ROM in spin. Skin: has sacral ulcer Neuro: Alert, oriented X3, cranial nerves II-XII grossly intact, moves all extremities normally. Psych: Patient is not psychotic, no suicidal or hemocidal ideation.  Labs on Admission: I have personally reviewed following labs and imaging studies  CBC: Recent Labs  Lab 02/05/18 0411 02/09/18 0437 02/09/18 2205  WBC 5.6 4.6 7.8  NEUTROABS  --   --  5.5  HGB 10.1* 10.5* 11.8*  HCT 33.9* 34.6* 39.5  MCV 90.4 90.6 91.6  PLT 338 289 007   Basic Metabolic Panel: Recent Labs  Lab 02/05/18 0411 02/09/18 0437 02/09/18 2209  NA 137 137 136  K 3.8 3.8 3.9  CL 104 103 101  CO2 28 25 25   GLUCOSE 111* 96 140*  BUN 10 9 13   CREATININE 0.70 0.62 0.89  CALCIUM 9.0 9.1 9.7   GFR: Estimated Creatinine Clearance: 96.9  mL/min (by C-G formula based on SCr of 0.89 mg/dL). Liver Function Tests: No results for input(s): AST, ALT, ALKPHOS, BILITOT, PROT, ALBUMIN in the last 168 hours. No results for input(s): LIPASE, AMYLASE in the last 168 hours. No results for input(s): AMMONIA in the last 168 hours. Coagulation Profile: Recent Labs  Lab 02/10/18 0216  INR 1.47   Cardiac Enzymes: No results for input(s): CKTOTAL, CKMB, CKMBINDEX, TROPONINI in the last 168 hours. BNP (last 3 results) No results for input(s): PROBNP in the last 8760 hours. HbA1C: No results for input(s): HGBA1C in the last 72 hours. CBG: No results for input(s): GLUCAP in the last 168 hours. Lipid Profile: No results for input(s): CHOL, HDL, LDLCALC, TRIG, CHOLHDL, LDLDIRECT in the last 72 hours. Thyroid Function Tests: No results for input(s): TSH, T4TOTAL, FREET4, T3FREE, THYROIDAB in the last 72 hours. Anemia Panel: No results for input(s): VITAMINB12,  FOLATE, FERRITIN, TIBC, IRON, RETICCTPCT in the last 72 hours. Urine analysis:    Component Value Date/Time   COLORURINE AMBER (A) 02/09/2018 2259   APPEARANCEUR CLOUDY (A) 02/09/2018 2259   LABSPEC 1.028 02/09/2018 2259   PHURINE 5.0 02/09/2018 2259   GLUCOSEU NEGATIVE 02/09/2018 2259   HGBUR NEGATIVE 02/09/2018 2259   BILIRUBINUR SMALL (A) 02/09/2018 2259   KETONESUR 5 (A) 02/09/2018 2259   PROTEINUR 30 (A) 02/09/2018 2259   NITRITE NEGATIVE 02/09/2018 2259   LEUKOCYTESUR NEGATIVE 02/09/2018 2259   Sepsis Labs: @LABRCNTIP (procalcitonin:4,lacticidven:4) )No results found for this or any previous visit (from the past 240 hour(s)).   Radiological Exams on Admission: Dg Chest 2 View  Result Date: 02/09/2018 CLINICAL DATA:  Shortness of breath.  Surgery 3 weeks ago. EXAM: CHEST - 2 VIEW COMPARISON:  Jan 15, 2018 FINDINGS: The heart size and mediastinal contours are within normal limits. Both lungs are clear. The visualized skeletal structures are unremarkable. IMPRESSION: No  active cardiopulmonary disease. Electronically Signed   By: Dorise Bullion III M.D   On: 02/09/2018 22:58   Ct Angio Chest Pe W And/or Wo Contrast  Result Date: 02/10/2018 CLINICAL DATA:  Shortness of breath, streptococcal endocarditis. EXAM: CT ANGIOGRAPHY CHEST WITH CONTRAST TECHNIQUE: Multidetector CT imaging of the chest was performed using the standard protocol during bolus administration of intravenous contrast. Multiplanar CT image reconstructions and MIPs were obtained to evaluate the vascular anatomy. CONTRAST:  133m ISOVUE-370 IOPAMIDOL (ISOVUE-370) INJECTION 76% COMPARISON:  Chest radiograph 02/09/2018 FINDINGS: Cardiovascular: --Pulmonary arteries: Contrast injection is sufficient to demonstrate satisfactory opacification of the pulmonary arteries to the segmental level. There is no pulmonary embolus. The main pulmonary artery is within normal limits for size. --Aorta: Limited opacification of the aorta due to bolus timing optimization for the pulmonary arteries. Conventional 3 vessel aortic branching pattern. The aortic course and caliber are normal. There is no aortic atherosclerosis. --Heart: Mild cardiomegaly. No pericardial effusion. Mediastinum/Nodes: No mediastinal, hilar or axillary lymphadenopathy. The visualized thyroid and thoracic esophageal course are unremarkable. Lungs/Pleura: There are scattered areas of ground-glass opacity, particularly in the right middle lobe and lingula. Upper Abdomen: Contrast bolus timing is not optimized for evaluation of the abdominal organs. Large area of hypoattenuation in the spleen is consistent with known splenic infarct. Musculoskeletal: No chest wall abnormality. No acute or significant osseous findings. Review of the MIP images confirms the above findings. IMPRESSION: 1. No pulmonary embolus. 2. Multifocal ground-glass opacities within the lungs, predominantly in the right middle lobe and lingula. This may indicate multifocal pneumonia. 3. Known  splenic infarct. This is incompletely imaged on this study. Electronically Signed   By: KUlyses JarredM.D.   On: 02/10/2018 03:40     EKG: Independently reviewed.  Sinus rhythm, QTC 470, tachycardia, LAD, poor R wave progression  Assessment/Plan Principal Problem:   SOB (shortness of breath) Active Problems:   Cerebral embolism with cerebral infarction   Infective endocarditis   Chronic viral hepatitis B without delta-agent (HCC)   Chronic viral hepatitis C (HCC)   Depression   Malnutrition of moderate degree   Pressure injury of skin   Polysubstance abuse (HCC)   Tobacco abuse   Anemia of chronic disease   Elevated lactic acid level  SOB: Etiology is not clear.  Chest x-ray negative.  CT angiogram showed no PE, but multifocal ground-glass opacities within the lungs, predominantly in the right middle lobe and lingula, indicating possible atypical infection.  Patient does not have fever or leukocytosis. No CP  or cough. Clinically not septic.  Patient was given 1 dose of vancomycin and Zosyn.  I will not treat pt as HCAP, will not continue these two antibiotics. I will continue home amoxicillin.  - will admit to tele bed as inpt - continue amoxicillin - Mucinex for cough  - prn Albuterol Nebs for SOB - Follow up blood culture x2, sputum culture - will get Procalcitonin and trend lactic acid level - IVF: 2L of NS bolus in ED, followed by 100  Elevated lactic acid level: lactic acid 3.09. No fever or leukocytosis, clinically does not seem to have sepsis.  Possibly due to dehydration.  - IV fluid as above, -Trend lactic acid level  Streptococcus pneumonia mitral valve endocarditis, complicated by multiple septic emboli to kidneys, spleen, brain: per previous discharge summary, pt was treated with IV penicillin for 6 weeks, and discharged on indefinite amoxicillin per ID recommendations. Blood cultures on admission were negative. 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 - will continue amoxicillin  - repeat Bx  - He will be followed by Dr. 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  Polysubstance abuse, opioid use disorder: Continue Suboxone, Dr. Evette Doffing (IM) has seen the patient.   -continue Suboxone  Depression -Continue Wellbutrin  Tobacco use -Continue nicotine gum  Chronic hepatitis B and C -Outpatient follow-up with ID scheduled on 6/18  MCA CVA/ICH, M2 occlusion: per discharge summary, "Cerebral angiogram showed no cerebral aneurysm, no further work-up per neurology, follow-up outpatient in 4 weeks with stroke clinic"  Anemia of chronic disease: Hgb 11.5, stable. -f/u by CBC  Moderate malnutrition -nutrition consult  Sacral wound, unstageable -Wound care consult   DVT ppx: SQ Lovenox Code Status: Partial code (I discussed with the patient, and explained the meaning of CODE STATUS, patient wants to be partial code, OK for CPR, but no intubation). Family Communication: None at bed side.   Disposition Plan:  Anticipate discharge back to previous home environment Consults called:  none Admission status:   Inpatient/tele       Date of Service 02/10/2018    Ivor Costa [facility] Pager [phone]  If 7PM-7AM, please contact night-coverage [email or web address] Password [facility] 02/10/2018, 6:23 AM

## 2018-02-10 NOTE — ED Notes (Signed)
  Attempted to call report.  RN will call me back. 

## 2018-02-10 NOTE — ED Notes (Signed)
Infectious disease doc at bedside

## 2018-02-10 NOTE — ED Notes (Signed)
Care assumed at this time.

## 2018-02-11 DIAGNOSIS — R0600 Dyspnea, unspecified: Secondary | ICD-10-CM

## 2018-02-11 MED ORDER — ENSURE ENLIVE PO LIQD
237.0000 mL | Freq: Three times a day (TID) | ORAL | Status: DC
  Administered 2018-02-11 – 2018-02-12 (×4): 237 mL via ORAL

## 2018-02-11 MED ORDER — ADULT MULTIVITAMIN W/MINERALS CH
1.0000 | ORAL_TABLET | Freq: Every day | ORAL | Status: DC
  Administered 2018-02-11 – 2018-02-12 (×2): 1 via ORAL
  Filled 2018-02-11: qty 1

## 2018-02-11 NOTE — Progress Notes (Addendum)
CSW assisting in finding shelter placement:  [facility]- no bed and not accepting people in their lobby at this time  [facility]- full with wait list- don't anticipate anyone discharging till August  [facility]- left message  [facility]- [facility] message  [facility]- left message regarding referral  [facility]- attempted to call unable to leave message  [facility] [place]- patient has been before and shelter states he would have come back to "Staffing" meeting prior to being eligible for return- next staffing meeting is Wed 6/19 at 4pm- patient not eligible for this shelter until he attends  [facility]- no availability  [facility] program based out of [facility] to meet with patient at 11am tomorrow to assess for their case management services  Burna SisJenna H. Vinessa Macconnell, LCSW Clinical Social Worker [phone]

## 2018-02-11 NOTE — Progress Notes (Signed)
Initial Nutrition Assessment  DOCUMENTATION CODES:   Non-severe (moderate) malnutrition in context of social or environmental circumstances  INTERVENTION:  Ensure Enlive po QID, each supplement provides 350 kcal and 20 grams of protein  MVI w/ Minerals  NUTRITION DIAGNOSIS:   Moderate Malnutrition related to social / environmental circumstances(homelessness, food insecurity) as evidenced by moderate muscle depletion, moderate fat depletion  GOAL:   Patient will meet greater than or equal to 90% of their needs  MONITOR:   PO intake, Supplement acceptance, Labs, Weight trends  REASON FOR ASSESSMENT:   Consult Assessment of nutrition requirement/status  ASSESSMENT:   42 year old male with a history of HBV, HCV, polysubstance abuse, depression, tobacco abuse, anemia, stimulable endocarditis, 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  Patient came back to hospital with worsening shortness of breath  RD consulted for assessment. This RD saw patient during previous admission.  He states he was out and about on his feet a lot following discharge and that he "over did it." His PO intake outside of the hospital has not hanged from his previous admission; he was still eating 1 meal per day.  Weight is unchanged at 139 pounds today. PO intake during this admission continues to be good, ate 75% of breakfast, was eating lunch during visit. States that his appetite is good. Also has been asking for ensure, ordered for patient.  Will monitor PO intake.  Labs reviewed  Medications reviewed and include:  NS at 16800mL/hr  NUTRITION - FOCUSED PHYSICAL EXAM:    Most Recent Value  Orbital Region  Moderate depletion  Upper Arm Region  Moderate depletion  Thoracic and Lumbar Region  Moderate  depletion  Buccal Region  Moderate depletion  Temple Region  Moderate depletion  Clavicle Bone Region  Moderate depletion  Clavicle and Acromion Bone Region  Moderate depletion  Scapular Bone Region  Moderate depletion  Dorsal Hand  Moderate depletion  Patellar Region  Moderate depletion  Anterior Thigh Region  Moderate depletion  Posterior Calf Region  Moderate depletion       Diet Order:   Diet Order           Diet regular Room service appropriate? Yes; Fluid consistency: Thin  Diet effective now          EDUCATION NEEDS:   Not appropriate for education at this time  Skin:  Skin Integrity Issues:: Stage II Stage II: coccyx  Last BM:  6/12  Height:   Ht Readings from Last 1 Encounters:  02/10/18 5\' 11"  (1.803 m)    Weight:   Wt Readings from Last 1 Encounters:  02/10/18 139 lb 15.9 oz (63.5 kg)    Ideal Body Weight:  78.18 kg  BMI:  Body mass index is 19.52 kg/m.  Estimated Nutritional Needs:   Kcal:  2000-2200 calories  Protein:  95-107 grams (1.5-1.7g/kg)  Fluid:  >2L    Marcus AnoWilliam M. Sourish Allender, MS, RD LDN Inpatient Clinical Dietitian Pager [phone]

## 2018-02-11 NOTE — Care Management Note (Signed)
Case Management Note  Patient Details  Name: Marcus [name] MRN: [id number] Date of Birth: February 28, 1976  Subjective/Objective:           Spoke w patient at bedside. He stated that he still had meds from getting filled at last DC, specifically abx. No changes noted. He stated he still has resource list for homeless shelters from last DC. No other CM needs. Anticipate DC today.          Action/Plan:   Expected Discharge Date:                  Expected Discharge Plan:  Homeless Shelter  In-House Referral:  Clinical Social Work  Discharge planning Services     Post Acute Care Choice:    Choice offered to:     DME Arranged:    DME Agency:     HH Arranged:    HH Agency:     Status of Service:  Completed, signed off  If discussed at [employer] [facility] [employer] Meetings, dates discussed:    Additional Comments:  Lawerance SabalDebbie Fareeda Downard, RN 02/11/2018, 11:53 AM

## 2018-02-11 NOTE — Progress Notes (Signed)
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  Patient came back to hospital with worsening shortness of breath.  CTA showed no pulmonary embolism, showed multifocal groundglass opacity within the lung predominantly in the right middle lobe and lingula.     Subjective   Patient seen and examined, complains of generalized weakness.  Was seen by ID yesterday no new treatment plans at this time.  ID has signed off.   Assessment/Plan:     1. Dyspnea-patient is currently on suppressive amoxicillin therapy, he was seen by ID yesterday.  I did not feel that patient has pneumonia and no need to escalate therapy.  We will continue with amoxicillin. 2. 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  Patient will be followed by ID as outpatient has an appointment on 02/16/2018. 3. 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 4. Polysubstance abuse, opiate use disorder-continue Suboxone 5. Depression-continue Wellbutrin 6. Tobacco use-continue nicotine  gum MCA 7. CVA/ICH M2 occlusion-Per discharge summary,"Cerebral angiogram showed no cerebral aneurysm, no further work-up per neurology, follow-up outpatient in 4 weeks with stroke clinic" 8. Generalized weakness-we will obtain PT OT consultation.      DVT prophylaxis: Lovenox  Code Status: Full code  Family Communication: No family at bedside  Disposition Plan: likely home when medically ready for discharge   Consultants:  Infectious disease  Procedures:  None   Antibiotics:   Anti-infectives (From admission, onward)   Start     Dose/Rate Route Frequency Ordered Stop   02/10/18 1000  amoxicillin (AMOXIL) capsule 1,000 mg    Note to Pharmacy:  X 6 MONTHS     1,000 mg Oral 2 times daily 02/10/18 0343     02/10/18 0500  vancomycin (VANCOCIN) IVPB 1000 mg/200 mL premix     1,000 mg 200 mL/hr over 60 Minutes Intravenous  Once 02/10/18 0451 02/10/18 0634   02/10/18 0500  piperacillin-tazobactam (ZOSYN) IVPB 3.375 g     3.375 g 100 mL/hr over 30 Minutes Intravenous  Once 02/10/18 0451 02/10/18 0609   02/10/18 0145  vancomycin (VANCOCIN) IVPB 1000 mg/200 mL premix  Status:  Discontinued     1,000 mg 200 mL/hr over 60 Minutes Intravenous  Once 02/10/18 0138 02/10/18 0342   02/10/18 0145  piperacillin-tazobactam (ZOSYN) IVPB 3.375 g  Status:  Discontinued     3.375 g 100 mL/hr over 30 Minutes Intravenous  Once 02/10/18 0138 02/10/18 0342       Objective   Vitals:   02/10/18 2100 02/10/18 2115 02/10/18 2300 02/11/18 0844  BP: 103/62 100/71  93/61  Pulse: 79 75  72  Resp: 16 13  17   Temp:    98.1 F (36.7 C)  TempSrc:    Oral  SpO2:  99% 100%  100%  Weight:   63.5 kg (139 lb 15.9 oz)   Height:   5\' 11"  (1.803 m)     Intake/Output Summary (Last 24 hours) at 02/11/2018 1401 Last data filed at 02/11/2018 1000 Gross per 24 hour  Intake 1760 ml  Output 500 ml  Net 1260 ml   Filed Weights   02/09/18 2202 02/10/18 2300  Weight: 62.1 kg (137 lb) 63.5 kg (139 lb 15.9  oz)     Physical Examination:    General: Appears in no acute distress  Cardiovascular: S1-S2, regular   Respiratory: clear to auscultation bilaterally  Abdomen: Soft, nontender, no organomegaly   Extremities:  no cyanosis/clubbing edema of the lower extremities  Neurologic: Alert oriented x3, no focal deficit     Data Reviewed: I have personally reviewed following labs and imaging studies  CBG: No results for input(s): GLUCAP in the last 168 hours.  CBC: Recent Labs  Lab 02/05/18 0411 02/09/18 0437 02/09/18 2205  WBC 5.6 4.6 7.8  NEUTROABS  --   --  5.5  HGB 10.1* 10.5* 11.8*  HCT 33.9* 34.6* 39.5  MCV 90.4 90.6 91.6  PLT 338 289 395    Basic Metabolic Panel: Recent Labs  Lab 02/05/18 0411 02/09/18 0437 02/09/18 2209  NA 137 137 136  K 3.8 3.8 3.9  CL 104 103 101  CO2 28 25 25   GLUCOSE 111* 96 140*  BUN 10 9 13   CREATININE 0.70 0.62 0.89  CALCIUM 9.0 9.1 9.7    Recent Results (from the past 240 hour(s))  Blood culture (routine x 2)     Status: None (Preliminary result)   Collection Time: 02/10/18  1:46 AM  Result Value Ref Range Status   Specimen Description BLOOD LEFT FOREARM  Final   Special Requests   Final    BOTTLES DRAWN AEROBIC AND ANAEROBIC Blood Culture adequate volume   Culture   Final    NO GROWTH 1 DAY Performed at [facility], [address] [place], [place] [address]    Report Status PENDING  Incomplete  Blood culture (routine x 2)     Status: None (Preliminary result)   Collection Time: 02/10/18  2:16 AM  Result Value Ref Range Status   Specimen Description BLOOD RIGHT HAND  Final   Special Requests   Final    BOTTLES DRAWN AEROBIC AND ANAEROBIC Blood Culture adequate volume   Culture   Final    NO GROWTH 1 DAY Performed at [facility], [address] [place], [place] [address]    Report Status PENDING  Incomplete     Liver Function Tests: No results for input(s): AST, ALT, ALKPHOS, BILITOT, PROT,  ALBUMIN in the last 168 hours. No results for input(s): LIPASE, AMYLASE in the last 168 hours. No results for input(s): AMMONIA in the last 168 hours.  Cardiac Enzymes: No results for input(s): CKTOTAL, CKMB, CKMBINDEX, TROPONINI in the last 168 hours. BNP (last 3 results) No results for input(s): BNP in the last 8760 hours.  ProBNP (last 3 results) No results for input(s): PROBNP in the last 8760 hours.    Studies: Dg Chest 2 View  Result Date: 02/09/2018 CLINICAL DATA:  Shortness of breath.  Surgery 3 weeks ago. EXAM: CHEST - 2 VIEW COMPARISON:  Jan 15, 2018 FINDINGS: The heart size and mediastinal contours are within normal limits. Both lungs are clear. The visualized skeletal structures are unremarkable. IMPRESSION: No active cardiopulmonary disease. Electronically Signed  By: Gerome Sam III M.D   On: 02/09/2018 22:58   Ct Angio Chest Pe W And/or Wo Contrast  Result Date: 02/10/2018 CLINICAL DATA:  Shortness of breath, streptococcal endocarditis. EXAM: CT ANGIOGRAPHY CHEST WITH CONTRAST TECHNIQUE: Multidetector CT imaging of the chest was performed using the standard protocol during bolus administration of intravenous contrast. Multiplanar CT image reconstructions and MIPs were obtained to evaluate the vascular anatomy. CONTRAST:  ISOVUE-370 IOPAMIDOL (ISOVUE-370) INJECTION 76% COMPARISON:  Chest radiograph 02/09/2018 FINDINGS: Cardiovascular: --Pulmonary arteries: Contrast injection is sufficient to demonstrate satisfactory opacification of the pulmonary arteries to the segmental level. There is no pulmonary embolus. The main pulmonary artery is within normal limits for size. --Aorta: Limited opacification of the aorta due to bolus timing optimization for the pulmonary arteries. Conventional 3 vessel aortic branching pattern. The aortic course and caliber are normal. There is no aortic atherosclerosis. --Heart: Mild cardiomegaly. No pericardial effusion. Mediastinum/Nodes: No  mediastinal, hilar or axillary lymphadenopathy. The visualized thyroid and thoracic esophageal course are unremarkable. Lungs/Pleura: There are scattered areas of ground-glass opacity, particularly in the right middle lobe and lingula. Upper Abdomen: Contrast bolus timing is not optimized for evaluation of the abdominal organs. Large area of hypoattenuation in the spleen is consistent with known splenic infarct. Musculoskeletal: No chest wall abnormality. No acute or significant osseous findings. Review of the MIP images confirms the above findings. IMPRESSION: 1. No pulmonary embolus. 2. Multifocal ground-glass opacities within the lungs, predominantly in the right middle lobe and lingula. This may indicate multifocal pneumonia. 3. Known splenic infarct. This is incompletely imaged on this study. Electronically Signed   By: Deatra Robinson M.D.   On: 02/10/2018 03:40    Scheduled Meds: . amoxicillin  1,000 mg Oral BID  . buprenorphine-naloxone  1 tablet Sublingual Daily  . buPROPion  150 mg Oral Daily  . enoxaparin (LOVENOX) injection  40 mg Subcutaneous Daily  . feeding supplement (ENSURE ENLIVE)  237 mL Oral TID AC & HS  . nicotine  21 mg Transdermal Daily      Time spent: 25 min  Meredeth Ide   [facility] Pager [phone]. If 7PM-7AM, please contact night-coverage at [email or web address], Office  [phone]  password [email or web address]  02/11/2018, 2:01 PM  LOS: 1 day
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
## 2018-02-12 DIAGNOSIS — B182 Chronic viral hepatitis C: Secondary | ICD-10-CM

## 2018-02-12 DIAGNOSIS — D638 Anemia in other chronic diseases classified elsewhere: Secondary | ICD-10-CM

## 2018-02-12 NOTE — Progress Notes (Signed)
CSW alerted by [facility] program representative through [facility] that they are unable to come and assess the patient today like was initially scheduled yesterday. Patient is aware. CSW provided bus passes for patient transportation.  No other assistance available for patient at this time. CSW signing off.  Blenda NicelyElizabeth Sameera [place], [place] Clinical Social Worker [phone]

## 2018-02-12 NOTE — Discharge Summary (Signed)
Physician Discharge Summary  Vernal Hritz ZOX:[id number] DOB: [birth year] DOA: 02/09/2018  PCP: [facility]  Admit date: 02/09/2018 Discharge date: 02/12/2018  Time spent: *45 minutes  Recommendations for Outpatient Follow-up:  1. 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)   Chronic viral hepatitis C (HCC)   Depression   Malnutrition of moderate degree   Pressure injury of skin   Polysubstance abuse (HCC)   Tobacco abuse   Anemia of chronic disease   Elevated lactic acid level   Discharge Condition: Stable  Diet recommendation: Regular diet  Filed Weights   02/09/18 2202 02/10/18 2300  Weight: 62.1 kg (137 lb) 63.5 kg (139 lb 15.9 oz)    History of present illness:  42 year old male with a history of HBV, HCV, polysubstance abuse, depression, tobacco abuse, anemia, stimulable endocarditis, 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 Patient came back to hospital with worsening shortness of breath. CTA showed no pulmonary embolism, showed multifocal groundglass opacity within the lung predominantly in the right middle lobe and lingula.   Hospital Course:   1. Dyspnea-patient is currently on suppressive amoxicillin therapy, he was seen by ID yesterday.  I did not feel that patient has pneumonia and no need to escalate therapy.  We will continue with amoxicillin.  2. 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  Patient will be followed by ID as outpatient has an appointment on 02/16/2018.  3. 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  4. Polysubstance abuse, opiate use disorder-continue Suboxone  5. Depression-continue Wellbutrin  6. Tobacco use-continue nicotine gum   7. CVA/ICH M2 occlusion-Per discharge summary,"Cerebral angiogram showed no cerebral aneurysm, no further work-up per neurology, follow-up outpatient in 4 weeks with stroke clinic"  8. Generalized weakness-PT OT consultation was obtained, he is not a candidate for rehab.   Procedures: None   Consultations:  None   Discharge Exam: Vitals:   02/12/18 0002 02/12/18 0814  BP: 111/70 109/67  Pulse: 88 83  Resp:  17  Temp: 98.6 F (37 C) 98 F (36.7 C)  SpO2: 90% 98%    General: Appears in no acute distress Cardiovascular: S1S2 RRR Respiratory: Clear bilaterally  Discharge Instructions   Discharge Instructions    Diet - low sodium heart healthy   Complete by:  As directed    Increase activity slowly   Complete by:  As directed      Allergies as of 02/12/2018      Reactions   Bee Venom Anaphylaxis   Food Hives, Other (See Comments)   grapefruit   Tylenol [acetaminophen] Other (See Comments)   Pt states that he is not supposed to take this medication because of his Hep C.       Medication List  TAKE these medications   amoxicillin 500 MG capsule Commonly known as:  AMOXIL Take 2 capsules (1,000 mg total) by mouth 2 (two) times daily. X 6 MONTHS   buprenorphine-naloxone 8-2 mg Subl SL tablet Commonly known as:  SUBOXONE Place 1 tablet under the tongue  daily for 7 days. Further refill by IM suboxone clinic follow-up   buPROPion 150 MG 24 hr tablet Commonly known as:  WELLBUTRIN XL Take 1 tablet (150 mg total) by mouth daily.   MELATONIN PO Take 2 tablets by mouth daily as needed (Sleep).      Allergies  Allergen Reactions  . Bee Venom Anaphylaxis  . Food Hives and Other (See Comments)    grapefruit  . Tylenol [Acetaminophen] Other (See Comments)    Pt states that he is not supposed to take this medication because of his Hep C.       The results of significant diagnostics from this hospitalization (including imaging, microbiology, ancillary and laboratory) are listed below for reference.    Significant Diagnostic Studies: Dg Chest 2 View  Result Date: 02/09/2018 CLINICAL DATA:  Shortness of breath.  Surgery 3 weeks ago. EXAM: CHEST - 2 VIEW COMPARISON:  Jan 15, 2018 FINDINGS: The heart size and mediastinal contours are within normal limits. Both lungs are clear. The visualized skeletal structures are unremarkable. IMPRESSION: No active cardiopulmonary disease. Electronically Signed   By: Gerome Sam III M.D   On: 02/09/2018 22:58   Dg Chest 2 View  Result Date: 01/14/2018 CLINICAL DATA:  Left chest pain EXAM: CHEST - 2 VIEW COMPARISON:  01/13/2018 FINDINGS: Heart and mediastinal contours are within normal limits. No focal opacities or effusions. No acute bony abnormality. IMPRESSION: No active cardiopulmonary disease. Electronically Signed   By: Charlett Nose M.D.   On: 01/14/2018 08:29   Dg Chest 2 View  Result Date: 01/13/2018 CLINICAL DATA:  Weakness, hungry, pain.  History of hepatitis-C. EXAM: CHEST - 2 VIEW COMPARISON:  None. FINDINGS: The heart size and mediastinal contours are within normal limits. Both lungs are clear. The visualized skeletal structures are unremarkable. IMPRESSION: Negative. Electronically Signed   By: Awilda Metro M.D.   On: 01/13/2018 19:46   Ct Head Wo Contrast  Result Date:  01/24/2018 CLINICAL DATA:  Continued surveillance infarcts and hemorrhage. EXAM: CT HEAD WITHOUT CONTRAST TECHNIQUE: Contiguous axial images were obtained from the base of the skull through the vertex without intravenous contrast. COMPARISON:  CT head 01/14/2018. MR head 01/14/2018. CT angiogram 01/18/2018. FINDINGS: Brain: Previously identified subacute infarcts are involving into areas of chronic ischemia affecting the BILATERAL RIGHT greater than LEFT subcortical white matter. Tiny focus of hyperattenuation, RIGHT frontal cortex, image 23 appears improved. This measures no more than 2 x 3 mm on today's exam. Vascular: Improved appearance of the previously identified RIGHT M2 occlusion. Some hyperattenuation of vessel wall in the sylvian fissure, see series 3, image 12. Skull: Normal. Negative for fracture or focal lesion. Sinuses/Orbits: No acute finding. Other: None. IMPRESSION: Previously identified subacute white matter infarcts are have evolved into areas of chronic ischemia. Tiny focus of hyperattenuation RIGHT frontal cortex is improved. Improved appearance of the previously identified RIGHT M2 occlusion. Electronically Signed   By: Elsie Stain M.D.   On: 01/24/2018 17:38   Ct Head Wo Contrast  Addendum Date: 01/14/2018   ADDENDUM REPORT: 01/14/2018 10:35 ADDENDUM: Study discussed by telephone with Dr. Shaune Pollack on 01/14/2018 at 1015 hours. 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 THEREFORE, the  appearance is highly likely to reflect SEPTIC EMBOLI, with acute embolic related hemorrhage in the right superior frontal lobe. Electronically Signed   By: Odessa Fleming M.D.   On: 01/14/2018 10:35   Result Date: 01/14/2018 CLINICAL DATA:  42 year old male with altered level consciousness, lightheadedness and dizzy onset about 1 hour ago. EXAM: CT HEAD WITHOUT CONTRAST TECHNIQUE: Contiguous axial images were obtained from the base of the skull through the vertex without intravenous contrast.  COMPARISON:  None. FINDINGS: Brain: Cytotoxic edema at the right anterior insula and frontal operculum. No associated hemorrhage or mass effect in those segments. Small amorphous 7 millimeter focus of hyperdensity at the right superior frontal gyrus on series 3, image 25 and sagittal image 26. On coronal images there is adjacent hypodensity (coronal image 34). No regional mass effect. No other cytotoxic edema identified. ASPECTS 7 (abnormal insula, M4, M 5). More chronic appearing left corona radiata white matter hypodensity on the left. No intracranial mass effect or midline shift. No ventriculomegaly. Normal basilar cisterns. Vascular: Abnormal cylindrical hyperdensity at the right MCA proximal M2 segment with Vessel expansion (series 3, image 14 and series 6, image 21). Skull: No acute osseous abnormality identified. Sinuses/Orbits: Visualized paranasal sinuses and mastoids are well pneumatized. Other: Mildly Disconjugate gaze, otherwise negative orbits soft tissues. No acute scalp soft tissue findings, scattered benign appearing scalp dermal nodularity. IMPRESSION: 1. 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 2. Possible trace focus of acute hemorrhage in the right superior frontal gyrus on series 3, image 25. No other acute intracranial hemorrhage identified. 3. Right MCA territory cytotoxic edema, ASPECTS 7 (although note suspicion of trace acute hemorrhage in #2). 4. Chronic appearing left MCA white matter disease. Electronically Signed: By: Odessa Fleming M.D. On: 01/14/2018 10:18   Ct Angio Chest Pe W And/or Wo Contrast  Result Date: 02/10/2018 CLINICAL DATA:  Shortness of breath, streptococcal endocarditis. EXAM: CT ANGIOGRAPHY CHEST WITH CONTRAST TECHNIQUE: Multidetector CT imaging of the chest was performed using the standard protocol during bolus administration of intravenous contrast. Multiplanar CT image reconstructions and MIPs were obtained to evaluate the vascular anatomy.  CONTRAST:  ISOVUE-370 IOPAMIDOL (ISOVUE-370) INJECTION 76% COMPARISON:  Chest radiograph 02/09/2018 FINDINGS: Cardiovascular: --Pulmonary arteries: Contrast injection is sufficient to demonstrate satisfactory opacification of the pulmonary arteries to the segmental level. There is no pulmonary embolus. The main pulmonary artery is within normal limits for size. --Aorta: Limited opacification of the aorta due to bolus timing optimization for the pulmonary arteries. Conventional 3 vessel aortic branching pattern. The aortic course and caliber are normal. There is no aortic atherosclerosis. --Heart: Mild cardiomegaly. No pericardial effusion. Mediastinum/Nodes: No mediastinal, hilar or axillary lymphadenopathy. The visualized thyroid and thoracic esophageal course are unremarkable. Lungs/Pleura: There are scattered areas of ground-glass opacity, particularly in the right middle lobe and lingula. Upper Abdomen: Contrast bolus timing is not optimized for evaluation of the abdominal organs. Large area of hypoattenuation in the spleen is consistent with known splenic infarct. Musculoskeletal: No chest wall abnormality. No acute or significant osseous findings. Review of the MIP images confirms the above findings. IMPRESSION: 1. No pulmonary embolus. 2. Multifocal ground-glass opacities within the lungs, predominantly in the right middle lobe and lingula. This may indicate multifocal pneumonia. 3. Known splenic infarct. This is incompletely imaged on this study. Electronically Signed   By: Deatra Robinson M.D.   On: 02/10/2018 03:40   Ct Angio Abdomen W &/or Wo Contrast  Result Date: 01/21/2018 CLINICAL DATA:  Aorta  occlusion EXAM: CT ANGIOGRAPHY ABDOMEN TECHNIQUE: Multidetector CT imaging of the abdomen was performed using the standard protocol during bolus administration of intravenous contrast. Multiplanar reconstructed images and MIPs were obtained and reviewed to evaluate the vascular anatomy. CONTRAST:   ISOVUE-370 IOPAMIDOL (ISOVUE-370) INJECTION 76% COMPARISON:  12/29/2017 [facility], report only FINDINGS: VASCULAR Aorta: The aorta is abruptly occluded just below the takeoff of the IMA. The borders of the abdominal aorta are poorly defined at the level of the occlusion. Mild aneurysmal dilatation of the distal aorta measuring up to 3.7 cm is suspected. At the level of the occlusion, there is mixed low-density and isodensity within the thrombus, as well as a lack of atherosclerotic calcification, suggesting subacute rather than acute age. There is also a mild thickening of the wall of the aorta with stranding in the adjacent fat. These findings suggest an inflammatory process. Celiac: Patent and ectatic. There is abrupt occlusion of the splenic artery, 2 cm from the hilum of the spleen. Distal branches reconstitute. The hepatic artery and its branches are grossly patent. Gastroduodenal artery is patent. SMA: Patent and ectatic. Renals: 2 right renal arteries and a single left renal artery are patent. IMA: Patent. Inflow: Bilateral common iliac arteries are occluded. There is some filling of the mid and distal left common iliac artery with contrast flow. The left common iliac artery measures 1.9 cm in maximal caliber. Right common iliac artery measures 3.6 cm in caliber. The left internal and external iliac arteries reconstitute and are partially imaged. Veins: No evidence of DVT. Review of the MIP images confirms the above findings. NON-VASCULAR Lower chest: No acute abnormality. Hepatobiliary: Unremarkable Pancreas: Unremarkable Spleen: There is a large nonspecific there is a complex cystic lesion in the spleen measuring up to 11.6 cm. Adrenals/Urinary Tract: There is scarring in the lower pole of the left kidney associated with wedge-shaped areas of low density. These findings likely represent small infarcts. This is also present in the upper pole of the right kidney to a lesser degree.  Stomach/Bowel: No obvious mass in the colon. No evidence of small-bowel obstruction. Lymphatic: Small para-aortic lymph nodes are present. Other: No free fluid.  Postoperative changes from appendectomy. Musculoskeletal: No vertebral compression deformity. Bilateral L5 pars defects. No evidence of anterolisthesis. IMPRESSION: VASCULAR There is abrupt occlusion of the aorta below the level of the IMA as described on the prior report. Mixed density within the aorta suggest subacute age. Also, there is thickening and inflammatory change of the wall of the aorta suggesting vasculitis or mycotic aneurysm. There is also associated occlusion of the bilateral common iliac arteries. Focal occlusion of the splenic artery. NON-VASCULAR Complex cystic lesion in the spleen is nonspecific. Small infarcts in scarring in the kidneys bilaterally. Electronically Signed   By: Jolaine Click M.D.   On: 01/21/2018 12:05   Mr Maxine Glenn Head Wo Contrast  Result Date: 01/14/2018 CLINICAL DATA:  In hospital for sepsis, altered mental status. History of hepatitis-C, polysubstance abuse and endocarditis. History of head injury 1 month ago. Follow-up RIGHT MCA stroke. EXAM: MRI HEAD WITHOUT CONTRAST MRA HEAD WITHOUT CONTRAST TECHNIQUE: Multiplanar, multiecho pulse sequences of the brain and surrounding structures were obtained without intravenous contrast. Angiographic images of the head were obtained using MRA technique without contrast. COMPARISON:  CT HEAD Jan 14, 2018 at 0951 hours. FINDINGS: MRI HEAD FINDINGS Multiple sequences are mildly or moderately motion degraded. INTRACRANIAL CONTENTS: Patchy reduced diffusion RIGHT frontal lobe and RIGHT basal ganglia, LEFT posterior frontal  lobe with normalized ADC values and focal T2 shine through. Associated FLAIR T2 hyperintense signal and faint hemosiderin staining. Slightly expanded FLAIR T2 hyperintense RIGHT basal ganglia. Superimposed focal susceptibility artifact bifrontal lobes, RIGHT  parietooccipital lobe. Multiple additional scattered micro hemorrhages. Mild parenchymal brain volume loss. No hydrocephalus. Old small RIGHT cerebellar infarcts. VASCULAR: Susceptibility artifact and T1 shortening RIGHT sylvian fissure corresponding to MCA density. SKULL AND UPPER CERVICAL SPINE: No abnormal sellar expansion. No suspicious calvarial bone marrow signal. Craniocervical junction maintained. SINUSES/ORBITS: The mastoid air-cells and included paranasal sinuses are well-aerated.The included ocular globes and orbital contents are non-suspicious. OTHER: None. MRA HEAD FINDINGS ANTERIOR CIRCULATION: Normal flow related enhancement of the included cervical, petrous, cavernous and supraclinoid internal carotid arteries. Patent anterior communicating artery. Mildly attenuated RIGHT MCA flow related enhancement, occluded RIGHT M2 segment with transient aneurysmal reconstitution. No flow limiting stenosis. POSTERIOR CIRCULATION: vertebral artery is dominant. Basilar artery is patent, with normal flow related enhancement of the main branch vessels. Patent posterior cerebral arteries. Robust RIGHT and smaller LEFT posterior communicating arteries present. No large vessel occlusion, flow limiting stenosis,  aneurysm. ANATOMIC VARIANTS: None. Source images and MIP images were reviewed. IMPRESSION: MRI HEAD: 1. Multifocal subacute bilateral MCA/watershed territory infarcts. 2. Peripheral susceptibility artifact, septic emboli suspected. There may be superimposed shear injury or underlying chronic hypertension. 3. Mild parenchymal brain volume loss for age. 4. Old small RIGHT cerebellar infarcts. MRA HEAD: 1. 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 2. Otherwise negative MRA head. Examination reviewed by Dr. Augusto Gamble with Dr. Wilford Corner, Neurology on Jan 14, 2018 at approximately 1425 hours. Electronically Signed   By: Awilda Metro M.D.   On: 01/14/2018 14:38   Mr Brain Wo Contrast  Result Date: 01/14/2018 CLINICAL DATA:  In hospital for sepsis, altered mental status. History of hepatitis-C, polysubstance abuse and endocarditis. History of head injury 1 month ago. Follow-up RIGHT MCA stroke. EXAM: MRI HEAD WITHOUT CONTRAST MRA HEAD WITHOUT CONTRAST TECHNIQUE: Multiplanar, multiecho pulse sequences of the brain and surrounding structures were obtained without intravenous contrast. Angiographic images of the head were obtained using MRA technique without contrast. COMPARISON:  CT HEAD Jan 14, 2018 at 0951 hours. FINDINGS: MRI HEAD FINDINGS Multiple sequences are mildly or moderately motion degraded. INTRACRANIAL CONTENTS: Patchy reduced diffusion RIGHT frontal lobe and RIGHT basal ganglia, LEFT posterior frontal lobe with normalized ADC values and focal T2 shine through. Associated FLAIR T2 hyperintense signal and faint hemosiderin staining. Slightly expanded FLAIR T2 hyperintense RIGHT basal ganglia. Superimposed focal susceptibility artifact bifrontal lobes, RIGHT parietooccipital lobe. Multiple additional scattered micro hemorrhages. Mild parenchymal brain volume loss. No hydrocephalus. Old small RIGHT cerebellar infarcts. VASCULAR: Susceptibility artifact and T1 shortening RIGHT sylvian fissure corresponding to MCA density. SKULL AND UPPER CERVICAL SPINE: No abnormal sellar expansion. No suspicious calvarial bone marrow signal. Craniocervical junction maintained. SINUSES/ORBITS: The mastoid air-cells and included paranasal sinuses are well-aerated.The included ocular globes and orbital contents are non-suspicious. OTHER: None. MRA HEAD FINDINGS ANTERIOR CIRCULATION: Normal flow related enhancement of the included cervical, petrous, cavernous and supraclinoid internal carotid arteries. Patent anterior communicating artery. Mildly attenuated RIGHT MCA flow related enhancement, occluded RIGHT M2 segment with transient aneurysmal  reconstitution. No flow limiting stenosis. POSTERIOR CIRCULATION: vertebral artery is dominant. Basilar artery is patent, with normal flow related enhancement of the main branch vessels. Patent posterior cerebral arteries. Robust RIGHT and smaller LEFT posterior communicating arteries present. No large vessel occlusion, flow limiting stenosis,  aneurysm. ANATOMIC VARIANTS: None. Source images and MIP images were reviewed. IMPRESSION: MRI HEAD: 1. Multifocal subacute bilateral MCA/watershed territory infarcts. 2. Peripheral susceptibility artifact, septic emboli suspected. There may be superimposed shear injury or underlying chronic hypertension. 3. Mild parenchymal brain volume loss for age. 4. Old small RIGHT cerebellar infarcts. MRA HEAD: 1. 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 2. Otherwise negative MRA head. Examination reviewed by Dr. Augusto Gamble with Dr. Wilford Corner, Neurology on Jan 14, 2018 at approximately 1425 hours. Electronically Signed   By: Awilda Metro M.D.   On: 01/14/2018 14:38   Dg Chest Port 1 View  Result Date: 01/15/2018 CLINICAL DATA:  Shortness of breath.  Acute mental status change. EXAM: PORTABLE CHEST 1 VIEW COMPARISON:  Jan 14, 2018 FINDINGS: No pneumothorax. Vascular crowding in the medial right lung base. No suspicious infiltrate. Mild atelectasis in the left base. The cardiomediastinal silhouette is stable. No other changes. IMPRESSION: Mild atelectasis in the left base.  No other significant change. Electronically Signed   By: Gerome Sam III M.D   On: 01/15/2018 07:40   Ir Angio Intra Extracran Sel Com Carotid Innominate Bilat Mod Sed  Result Date: 01/19/2018 CLINICAL DATA:  Focal right frontal hemorrhage. 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 EXAM: IR ANGIO VERTEBRAL SEL VERTEBRAL UNI RIGHT MOD SED; IR ULTRASOUND GUIDANCE VASC ACCESS RIGHT; IR ANGIO  VERTEBRAL SEL SUBCLAVIAN INNOMINATE UNI LEFT MOD SED; BILATERAL COMMON CAROTID AND INNOMINATE ANGIOGRAPHY COMPARISON:  MRI MRA of the brain of 01/14/2018. MEDICATIONS: Heparin 0 units IV; no antibiotic was administered within 1 hour of the procedure. ANESTHESIA/SEDATION: Versed 0.5 mg IV; Fentanyl 37.5 mcg IV. Moderate Sedation Time:  75 minutes. The patient was continuously monitored during the procedure by the interventional radiology nurse under my direct supervision. CONTRAST:  Isovue 300 approximately 60 mL. FLUOROSCOPY TIME:  Fluoroscopy Time: 8 minutes 42 seconds (822 mGy). COMPLICATIONS: None immediate. TECHNIQUE: Informed written consent was obtained from the patient after a thorough discussion of the procedural risks, benefits and alternatives. All questions were addressed. Maximal Sterile Barrier Technique was utilized including caps, mask, sterile gowns, sterile gloves, sterile drape, hand hygiene and skin antiseptic. A timeout was performed prior to the initiation of the procedure. The right antecubital fossa was prepped and draped in the usual sterile fashion. Thereafter using modified Seldinger technique, transbrachial access into the right brachial artery was obtained without difficulty. Over a 0.035 inch guidewire, a 5 French Pinnacle sheath was inserted. Through this, and also over 0.035 inch guidewire, a 5 French Simmons 2 catheter was advanced to the aortic arch region and formed without difficulty. It was selectively positioned in the right common carotid artery, the right vertebral artery, the left common carotid artery and the left subclavian artery. FINDINGS: The right vertebral artery origin is widely patent. The vessel is seen to opacify normally to the cranial skull base. Wide patency is seen of the right vertebrobasilar junction and the right posterior-inferior cerebellar artery. The opacified portion of the basilar artery, the posterior cerebral arteries, the superior cerebellar arteries  and the anterior-inferior cerebellar arteries appear normal into the capillary and venous phases. Non-opacified blood is seen in the basilar artery from contralateral vertebral artery. The left common carotid arteriogram demonstrates the left external carotid artery and its major branches to be widely patent. The left internal carotid artery at the bulb to the cranial skull base is widely patent. There is mild  tortuosity in the proximal 1/3 of the left internal carotid artery. The petrous, cavernous and the supraclinoid segments are widely patent. The left middle cerebral artery and the left anterior cerebral artery opacify normally into the capillary and venous phases. The venous phase demonstrates retrograde opacification of the left transverse sinus in its proximal 2/3 primarily from the ipsilateral vein of Labbe. The right common carotid arteriogram demonstrates the right external carotid artery and its major branches to be widely patent. The right internal carotid artery at the bulb to the cranial skull base is widely patent. There is a double U shaped tortuosity of the middle 1/3 of the right internal carotid artery without evidence of kinking. More distally, the right internal carotid artery is seen to opacify normally to the cranial skull base. The distal petrous, and the proximal cavernous segments demonstrate mild fusiform dilatation. Distal to this the supraclinoid right ICA is normally patent. The right middle cerebral artery and the right anterior cerebral artery demonstrate normal opacification into the capillary and venous phases. The right transverse sinus consistently demonstrates focal areas of moderate irregularity with questionable stenosis at its distal aspect. The left vertebral artery origin demonstrates mild stenosis. The vessel is seen to opacify normally to the cranial skull base. Wide patency is seen of the left vertebrobasilar junction and the left posterior-inferior cerebellar artery.  The opacified portion of the distal left vertebrobasilar junction, the basilar artery, the posterior cerebral arteries, the superior cerebellar arteries and the anterior-cerebellar arteries is grossly patent into the delayed arterial phase. IMPRESSION: Angiographically no evidence of intracranial aneurysms, arteriovenous malformations, dural AV fistula, dissections, or of intraluminal filling defects. Venous outflow demonstrates areas of caliber irregularity involving the right transverse sinus with suspicion of stenosis. However, patient motion precludes fine detail in this region. PLAN: As per referring physician. Electronically Signed   By: Julieanne Cotton M.D.   On: 01/18/2018 13:10   Ir Angio Vertebral Sel Subclavian Innominate Uni L Mod Sed  Result Date: 01/19/2018 CLINICAL DATA:  Focal right frontal hemorrhage. 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 EXAM: IR ANGIO VERTEBRAL SEL VERTEBRAL UNI RIGHT MOD SED; IR ULTRASOUND GUIDANCE VASC ACCESS RIGHT; IR ANGIO VERTEBRAL SEL SUBCLAVIAN INNOMINATE UNI LEFT MOD SED; BILATERAL COMMON CAROTID AND INNOMINATE ANGIOGRAPHY COMPARISON:  MRI MRA of the brain of 01/14/2018. MEDICATIONS: Heparin 0 units IV; no antibiotic was administered within 1 hour of the procedure. ANESTHESIA/SEDATION: Versed 0.5 mg IV; Fentanyl 37.5 mcg IV. Moderate Sedation Time:  75 minutes. The patient was continuously monitored during the procedure by the interventional radiology nurse under my direct supervision. CONTRAST:  Isovue 300 approximately 60 mL. FLUOROSCOPY TIME:  Fluoroscopy Time: 8 minutes 42 seconds (822 mGy). COMPLICATIONS: None immediate. TECHNIQUE: Informed written consent was obtained from the patient after a thorough discussion of the procedural risks, benefits and alternatives. All questions were addressed. Maximal Sterile Barrier Technique was utilized including caps, mask, sterile gowns, sterile gloves, sterile drape, hand hygiene and skin  antiseptic. A timeout was performed prior to the initiation of the procedure. The right antecubital fossa was prepped and draped in the usual sterile fashion. Thereafter using modified Seldinger technique, transbrachial access into the right brachial artery was obtained without difficulty. Over a 0.035 inch guidewire, a 5 French Pinnacle sheath was inserted. Through this, and also over 0.035 inch guidewire, a 5 French Simmons 2 catheter was advanced to the aortic arch region and formed without difficulty. It was selectively positioned in the right common carotid artery, the  right vertebral artery, the left common carotid artery and the left subclavian artery. FINDINGS: The right vertebral artery origin is widely patent. The vessel is seen to opacify normally to the cranial skull base. Wide patency is seen of the right vertebrobasilar junction and the right posterior-inferior cerebellar artery. The opacified portion of the basilar artery, the posterior cerebral arteries, the superior cerebellar arteries and the anterior-inferior cerebellar arteries appear normal into the capillary and venous phases. Non-opacified blood is seen in the basilar artery from contralateral vertebral artery. The left common carotid arteriogram demonstrates the left external carotid artery and its major branches to be widely patent. The left internal carotid artery at the bulb to the cranial skull base is widely patent. There is mild tortuosity in the proximal 1/3 of the left internal carotid artery. The petrous, cavernous and the supraclinoid segments are widely patent. The left middle cerebral artery and the left anterior cerebral artery opacify normally into the capillary and venous phases. The venous phase demonstrates retrograde opacification of the left transverse sinus in its proximal 2/3 primarily from the ipsilateral vein of Labbe. The right common carotid arteriogram demonstrates the right external carotid artery and its major  branches to be widely patent. The right internal carotid artery at the bulb to the cranial skull base is widely patent. There is a double U shaped tortuosity of the middle 1/3 of the right internal carotid artery without evidence of kinking. More distally, the right internal carotid artery is seen to opacify normally to the cranial skull base. The distal petrous, and the proximal cavernous segments demonstrate mild fusiform dilatation. Distal to this the supraclinoid right ICA is normally patent. The right middle cerebral artery and the right anterior cerebral artery demonstrate normal opacification into the capillary and venous phases. The right transverse sinus consistently demonstrates focal areas of moderate irregularity with questionable stenosis at its distal aspect. The left vertebral artery origin demonstrates mild stenosis. The vessel is seen to opacify normally to the cranial skull base. Wide patency is seen of the left vertebrobasilar junction and the left posterior-inferior cerebellar artery. The opacified portion of the distal left vertebrobasilar junction, the basilar artery, the posterior cerebral arteries, the superior cerebellar arteries and the anterior-cerebellar arteries is grossly patent into the delayed arterial phase. IMPRESSION: Angiographically no evidence of intracranial aneurysms, arteriovenous malformations, dural AV fistula, dissections, or of intraluminal filling defects. Venous outflow demonstrates areas of caliber irregularity involving the right transverse sinus with suspicion of stenosis. However, patient motion precludes fine detail in this region. PLAN: As per referring physician. Electronically Signed   By: Julieanne Cotton M.D.   On: 01/18/2018 13:10   Ir Angio Vertebral Sel Vertebral Uni R Mod Sed  Result Date: 01/19/2018 CLINICAL DATA:  Focal right frontal hemorrhage. 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 EXAM: IR ANGIO VERTEBRAL SEL VERTEBRAL  UNI RIGHT MOD SED; IR ULTRASOUND GUIDANCE VASC ACCESS RIGHT; IR ANGIO VERTEBRAL SEL SUBCLAVIAN INNOMINATE UNI LEFT MOD SED; BILATERAL COMMON CAROTID AND INNOMINATE ANGIOGRAPHY COMPARISON:  MRI MRA of the brain of 01/14/2018. MEDICATIONS: Heparin 0 units IV; no antibiotic was administered within 1 hour of the procedure. ANESTHESIA/SEDATION: Versed 0.5 mg IV; Fentanyl 37.5 mcg IV. Moderate Sedation Time:  75 minutes. The patient was continuously monitored during the procedure by the interventional radiology nurse under my direct supervision. CONTRAST:  Isovue 300 approximately 60 mL. FLUOROSCOPY TIME:  Fluoroscopy Time: 8 minutes 42 seconds (822 mGy). COMPLICATIONS: None immediate. TECHNIQUE: Informed written consent was obtained  from the patient after a thorough discussion of the procedural risks, benefits and alternatives. All questions were addressed. Maximal Sterile Barrier Technique was utilized including caps, mask, sterile gowns, sterile gloves, sterile drape, hand hygiene and skin antiseptic. A timeout was performed prior to the initiation of the procedure. The right antecubital fossa was prepped and draped in the usual sterile fashion. Thereafter using modified Seldinger technique, transbrachial access into the right brachial artery was obtained without difficulty. Over a 0.035 inch guidewire, a 5 French Pinnacle sheath was inserted. Through this, and also over 0.035 inch guidewire, a 5 French Simmons 2 catheter was advanced to the aortic arch region and formed without difficulty. It was selectively positioned in the right common carotid artery, the right vertebral artery, the left common carotid artery and the left subclavian artery. FINDINGS: The right vertebral artery origin is widely patent. The vessel is seen to opacify normally to the cranial skull base. Wide patency is seen of the right vertebrobasilar junction and the right posterior-inferior cerebellar artery. The opacified portion of the basilar  artery, the posterior cerebral arteries, the superior cerebellar arteries and the anterior-inferior cerebellar arteries appear normal into the capillary and venous phases. Non-opacified blood is seen in the basilar artery from contralateral vertebral artery. The left common carotid arteriogram demonstrates the left external carotid artery and its major branches to be widely patent. The left internal carotid artery at the bulb to the cranial skull base is widely patent. There is mild tortuosity in the proximal 1/3 of the left internal carotid artery. The petrous, cavernous and the supraclinoid segments are widely patent. The left middle cerebral artery and the left anterior cerebral artery opacify normally into the capillary and venous phases. The venous phase demonstrates retrograde opacification of the left transverse sinus in its proximal 2/3 primarily from the ipsilateral vein of Labbe. The right common carotid arteriogram demonstrates the right external carotid artery and its major branches to be widely patent. The right internal carotid artery at the bulb to the cranial skull base is widely patent. There is a double U shaped tortuosity of the middle 1/3 of the right internal carotid artery without evidence of kinking. More distally, the right internal carotid artery is seen to opacify normally to the cranial skull base. The distal petrous, and the proximal cavernous segments demonstrate mild fusiform dilatation. Distal to this the supraclinoid right ICA is normally patent. The right middle cerebral artery and the right anterior cerebral artery demonstrate normal opacification into the capillary and venous phases. The right transverse sinus consistently demonstrates focal areas of moderate irregularity with questionable stenosis at its distal aspect. The left vertebral artery origin demonstrates mild stenosis. The vessel is seen to opacify normally to the cranial skull base. Wide patency is seen of the left  vertebrobasilar junction and the left posterior-inferior cerebellar artery. The opacified portion of the distal left vertebrobasilar junction, the basilar artery, the posterior cerebral arteries, the superior cerebellar arteries and the anterior-cerebellar arteries is grossly patent into the delayed arterial phase. IMPRESSION: Angiographically no evidence of intracranial aneurysms, arteriovenous malformations, dural AV fistula, dissections, or of intraluminal filling defects. Venous outflow demonstrates areas of caliber irregularity involving the right transverse sinus with suspicion of stenosis. However, patient motion precludes fine detail in this region. PLAN: As per referring physician. Electronically Signed   By: Julieanne Cotton M.D.   On: 01/18/2018 13:10    Microbiology: Recent Results (from the past 240 hour(s))  Blood culture (routine x 2)  Status: None (Preliminary result)   Collection Time: 02/10/18  1:46 AM  Result Value Ref Range Status   Specimen Description BLOOD LEFT FOREARM  Final   Special Requests   Final    BOTTLES DRAWN AEROBIC AND ANAEROBIC Blood Culture adequate volume   Culture   Final    NO GROWTH 1 DAY Performed at [facility], [address] [place], [place] [address]    Report Status PENDING  Incomplete  Blood culture (routine x 2)     Status: None (Preliminary result)   Collection Time: 02/10/18  2:16 AM  Result Value Ref Range Status   Specimen Description BLOOD RIGHT HAND  Final   Special Requests   Final    BOTTLES DRAWN AEROBIC AND ANAEROBIC Blood Culture adequate volume   Culture   Final    NO GROWTH 1 DAY Performed at [facility], [address] [place], [place] [address]    Report Status PENDING  Incomplete     Labs: Basic Metabolic Panel: Recent Labs  Lab 02/09/18 0437 02/09/18 2209  NA 137 136  K 3.8 3.9  CL 103 101  CO2 25 25  GLUCOSE 96 140*  BUN 9 13  CREATININE 0.62 0.89  CALCIUM 9.1 9.7   Liver Function  Tests: No results for input(s): AST, ALT, ALKPHOS, BILITOT, PROT, ALBUMIN in the last 168 hours. No results for input(s): LIPASE, AMYLASE in the last 168 hours. No results for input(s): AMMONIA in the last 168 hours. CBC: Recent Labs  Lab 02/09/18 0437 02/09/18 2205  WBC 4.6 7.8  NEUTROABS  --  5.5  HGB 10.5* 11.8*  HCT 34.6* 39.5  MCV 90.6 91.6  PLT 289 395   Cardiac Enzymes: No results for input(s): CKTOTAL, CKMB, CKMBINDEX, TROPONINI in the last 168 hours. BNP: BNP (last 3 results) No results for input(s): BNP in the last 8760 hours.  ProBNP (last 3 results) No results for input(s): PROBNP in the last 8760 hours.  CBG: No results for input(s): GLUCAP in the last 168 hours.     Signed:  Meredeth IdeGagan S Meira Wahba MD.  [facility] 02/12/2018, 1:15 PM

## 2018-02-12 NOTE — Progress Notes (Signed)
Pt discharged from facility to self care and belongings were returned. Pt given discharge instructions including follow up appointments and medication administration. Pt had no further questions upon discharge.   Blane OharaSavannah Christopherjohn Schiele, RN

## 2018-02-12 NOTE — Progress Notes (Signed)
Discussed DC with patient. He has his RW at bedside, Windell MouldingRuth PT stated he walked well throughout department with it and would not be appropriate for SNF. Spoke w Dr Sharl MaLama, plan will be to DC today. Notified Marisue IvanLiz CSW that patient needs bus pass. No other CM needs

## 2018-02-12 NOTE — Progress Notes (Signed)
PT Cancellation Note  Patient Details Name: Marcus [name] MRN: [id number] DOB: 16-Mar-1976   Cancelled Treatment:    Reason Eval/Treat Not Completed: Other (comment).  PT attempted eval and pt needed some time to finish AM care before PT could see him.   Ivar DrapeRuth E Garyn Waguespack 02/12/2018, 9:53 AM   Samul Dadauth Johnson [place], PT MS Acute Rehab Dept. Number: [facility] [phone] and [facility] [phone]

## 2018-02-12 NOTE — Evaluation (Signed)
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   Now admitted after recent septic emboli to brain, spleen and kidney. 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  Admitted for calf pain and PNA.   PMH - hep c, polysubstance abuse, hep b, strep endocarditis, mycotic aneurysm.   Clinical Impression  Pt was seen for assessment of mobility which has not changed appreciably since recent admission.  Follow up is not needed as pt is baseline struggling to walk longer distances over his chronic back pain and is likely at PLOF.  He is appropriate for PT to mobilize during his stay then to move on to I gait on RW which was previously provided to pt    Follow Up Recommendations No PT follow up    Equipment Recommendations  Rolling walker with 5" wheels(pt has a walker)    Recommendations for Other Services       Precautions / Restrictions Precautions Precautions: Fall Precaution Comments: recent falls Restrictions Weight Bearing Restrictions: No      Mobility  Bed Mobility Overal bed mobility: Modified Independent Bed Mobility: Supine to Sit;Sit to Supine              Transfers Overall transfer level: Modified independent Equipment used: Rolling walker (2 wheeled) Transfers: Sit to/from Stand Sit to Stand: Modified independent (Device/Increase time)            Ambulation/Gait Ambulation/Gait assistance: Supervision Gait Distance (Feet): 225 Feet Assistive device: Rolling walker (2 wheeled) Gait Pattern/deviations: Step-through pattern;Decreased stride length;Narrow base of support Gait velocity: reduced Gait velocity interpretation: <1.31 ft/sec, indicative of household ambulator General Gait Details: walker used due to pain in calves  and complaints of weakness  Stairs            Wheelchair Mobility    Modified Rankin (Stroke Patients Only)       Balance Overall balance assessment: Needs assistance Sitting-balance support: Feet supported Sitting balance-Leahy Scale: Good     Standing balance support: No upper extremity supported Standing balance-Leahy Scale: Good Standing balance comment: maneuvering bedside but uses walker for pain management of legs                             Pertinent Vitals/Pain Pain Assessment: Faces Faces Pain Scale: Hurts little more Pain Location: calves Pain Descriptors / Indicators: Sore Pain Intervention(s): Monitored during session;Repositioned    Home Living Family/patient expects to be discharged to:: Unsure Living Arrangements: Alone   Type of Home: Homeless           Additional Comments: homeless    Prior Function Level of Independence: Independent with assistive device(s)         Comments: walks short trips in past over chronic pain     Hand Dominance        Extremity/Trunk Assessment   Upper Extremity Assessment Upper Extremity Assessment: Overall WFL for tasks assessed    Lower Extremity Assessment Lower Extremity Assessment: (minor hip weakness but not affecting gait on RW)    Cervical / Trunk Assessment Cervical / Trunk Assessment: Other exceptions(chronic lumbar spine pain)  Communication   Communication: No difficulties  Cognition Arousal/Alertness: Awake/alert Behavior During Therapy: WFL for tasks assessed/performed Overall  Cognitive Status: Within Functional Limits for tasks assessed                                        General Comments      Exercises     Assessment/Plan    PT Assessment    PT Problem List         PT Treatment Interventions      PT Goals (Current goals can be found in the Care Plan section)  Acute Rehab PT Goals Patient Stated Goal: to get stronger PT Goal  Formulation: With patient Time For Goal Achievement: 02/19/18 Potential to Achieve Goals: Good    Frequency Min 2X/week   Barriers to discharge        Co-evaluation               AM-PAC PT "6 Clicks" Daily Activity  Outcome Measure Difficulty turning over in bed (including adjusting bedclothes, sheets and blankets)?: None Difficulty moving from lying on back to sitting on the side of the bed? : None Difficulty sitting down on and standing up from a chair with arms (e.g., wheelchair, bedside commode, etc,.)?: None Help needed moving to and from a bed to chair (including a wheelchair)?: None Help needed walking in hospital room?: A Little Help needed climbing 3-5 steps with a railing? : A Little 6 Click Score: 22    End of Session Equipment Utilized During Treatment: Gait belt Activity Tolerance: Patient tolerated treatment well Patient left: in bed;with call bell/phone within reach;with bed alarm set Nurse Communication: Mobility status PT Visit Diagnosis: Unsteadiness on feet (R26.81);Muscle weakness (generalized) (M62.81);Pain Pain - Right/Left: (Bilatera) Pain - part of body: Leg    Time: 1610-9604 PT Time Calculation (min) (ACUTE ONLY): 24 min   Charges:   PT Evaluation $PT Eval Low Complexity: 1 Low PT Treatments $Gait Training: 8-22 mins   PT G Codes:   PT G-Codes **NOT FOR INPATIENT CLASS** Functional Assessment Tool Used: AM-PAC 6 Clicks Basic Mobility    Ivar Drape 02/12/2018, 2:47 PM   Samul Dada, PT MS Acute Rehab Dept. Number: [facility] [id number] and [facility] [phone]

## 2018-02-13 ENCOUNTER — Encounter (HOSPITAL_COMMUNITY): Payer: Self-pay

## 2018-02-13 ENCOUNTER — Emergency Department (HOSPITAL_COMMUNITY): Payer: Medicaid Other

## 2018-02-13 ENCOUNTER — Inpatient Hospital Stay (HOSPITAL_COMMUNITY)
Admission: EM | Admit: 2018-02-13 | Discharge: 2018-03-01 | DRG: 288 | Disposition: E | Payer: Medicaid Other | Attending: Internal Medicine | Admitting: Internal Medicine

## 2018-02-13 DIAGNOSIS — D638 Anemia in other chronic diseases classified elsewhere: Secondary | ICD-10-CM | POA: Diagnosis present

## 2018-02-13 DIAGNOSIS — I330 Acute and subacute infective endocarditis: Principal | ICD-10-CM | POA: Diagnosis present

## 2018-02-13 DIAGNOSIS — I059 Rheumatic mitral valve disease, unspecified: Secondary | ICD-10-CM | POA: Diagnosis present

## 2018-02-13 DIAGNOSIS — F419 Anxiety disorder, unspecified: Secondary | ICD-10-CM | POA: Diagnosis present

## 2018-02-13 DIAGNOSIS — F333 Major depressive disorder, recurrent, severe with psychotic symptoms: Secondary | ICD-10-CM | POA: Diagnosis present

## 2018-02-13 DIAGNOSIS — B181 Chronic viral hepatitis B without delta-agent: Secondary | ICD-10-CM | POA: Diagnosis present

## 2018-02-13 DIAGNOSIS — B182 Chronic viral hepatitis C: Secondary | ICD-10-CM | POA: Diagnosis present

## 2018-02-13 DIAGNOSIS — R52 Pain, unspecified: Secondary | ICD-10-CM

## 2018-02-13 DIAGNOSIS — Z560 Unemployment, unspecified: Secondary | ICD-10-CM

## 2018-02-13 DIAGNOSIS — Z590 Homelessness: Secondary | ICD-10-CM

## 2018-02-13 DIAGNOSIS — Z91030 Bee allergy status: Secondary | ICD-10-CM

## 2018-02-13 DIAGNOSIS — K5900 Constipation, unspecified: Secondary | ICD-10-CM | POA: Diagnosis present

## 2018-02-13 DIAGNOSIS — Z720 Tobacco use: Secondary | ICD-10-CM | POA: Diagnosis present

## 2018-02-13 DIAGNOSIS — Z8673 Personal history of transient ischemic attack (TIA), and cerebral infarction without residual deficits: Secondary | ICD-10-CM

## 2018-02-13 DIAGNOSIS — I340 Nonrheumatic mitral (valve) insufficiency: Secondary | ICD-10-CM | POA: Diagnosis present

## 2018-02-13 DIAGNOSIS — I058 Other rheumatic mitral valve diseases: Secondary | ICD-10-CM | POA: Diagnosis present

## 2018-02-13 DIAGNOSIS — R4189 Other symptoms and signs involving cognitive functions and awareness: Secondary | ICD-10-CM | POA: Diagnosis present

## 2018-02-13 DIAGNOSIS — I76 Septic arterial embolism: Secondary | ICD-10-CM | POA: Diagnosis present

## 2018-02-13 DIAGNOSIS — Z681 Body mass index (BMI) 19 or less, adult: Secondary | ICD-10-CM | POA: Diagnosis not present

## 2018-02-13 DIAGNOSIS — R945 Abnormal results of liver function studies: Secondary | ICD-10-CM

## 2018-02-13 DIAGNOSIS — I7410 Embolism and thrombosis of unspecified parts of aorta: Secondary | ICD-10-CM

## 2018-02-13 DIAGNOSIS — Z515 Encounter for palliative care: Secondary | ICD-10-CM | POA: Diagnosis present

## 2018-02-13 DIAGNOSIS — Z66 Do not resuscitate: Secondary | ICD-10-CM | POA: Diagnosis present

## 2018-02-13 DIAGNOSIS — Z807 Family history of other malignant neoplasms of lymphoid, hematopoietic and related tissues: Secondary | ICD-10-CM | POA: Diagnosis not present

## 2018-02-13 DIAGNOSIS — I611 Nontraumatic intracerebral hemorrhage in hemisphere, cortical: Secondary | ICD-10-CM | POA: Diagnosis present

## 2018-02-13 DIAGNOSIS — I700 Atherosclerosis of aorta: Secondary | ICD-10-CM

## 2018-02-13 DIAGNOSIS — F17210 Nicotine dependence, cigarettes, uncomplicated: Secondary | ICD-10-CM | POA: Diagnosis present

## 2018-02-13 DIAGNOSIS — Z886 Allergy status to analgesic agent status: Secondary | ICD-10-CM

## 2018-02-13 DIAGNOSIS — F1910 Other psychoactive substance abuse, uncomplicated: Secondary | ICD-10-CM | POA: Diagnosis present

## 2018-02-13 DIAGNOSIS — Z79899 Other long term (current) drug therapy: Secondary | ICD-10-CM | POA: Diagnosis not present

## 2018-02-13 DIAGNOSIS — R7989 Other specified abnormal findings of blood chemistry: Secondary | ICD-10-CM

## 2018-02-13 DIAGNOSIS — I714 Abdominal aortic aneurysm, without rupture: Secondary | ICD-10-CM | POA: Diagnosis present

## 2018-02-13 DIAGNOSIS — Z91018 Allergy to other foods: Secondary | ICD-10-CM

## 2018-02-13 DIAGNOSIS — I7409 Other arterial embolism and thrombosis of abdominal aorta: Secondary | ICD-10-CM | POA: Diagnosis present

## 2018-02-13 DIAGNOSIS — I6340 Cerebral infarction due to embolism of unspecified cerebral artery: Secondary | ICD-10-CM | POA: Diagnosis present

## 2018-02-13 DIAGNOSIS — R0600 Dyspnea, unspecified: Secondary | ICD-10-CM

## 2018-02-13 DIAGNOSIS — F1920 Other psychoactive substance dependence, uncomplicated: Secondary | ICD-10-CM | POA: Diagnosis present

## 2018-02-13 DIAGNOSIS — E440 Moderate protein-calorie malnutrition: Secondary | ICD-10-CM | POA: Diagnosis present

## 2018-02-13 DIAGNOSIS — F1120 Opioid dependence, uncomplicated: Secondary | ICD-10-CM | POA: Diagnosis present

## 2018-02-13 DIAGNOSIS — I745 Embolism and thrombosis of iliac artery: Secondary | ICD-10-CM | POA: Diagnosis present

## 2018-02-13 DIAGNOSIS — R0602 Shortness of breath: Secondary | ICD-10-CM | POA: Diagnosis present

## 2018-02-13 DIAGNOSIS — Z7189 Other specified counseling: Secondary | ICD-10-CM

## 2018-02-13 LAB — COMPREHENSIVE METABOLIC PANEL
ALT: 1031 U/L — ABNORMAL HIGH (ref 17–63)
AST: 713 U/L — AB (ref 15–41)
Albumin: 2.2 g/dL — ABNORMAL LOW (ref 3.5–5.0)
Alkaline Phosphatase: 268 U/L — ABNORMAL HIGH (ref 38–126)
Anion gap: 9 (ref 5–15)
CHLORIDE: 105 mmol/L (ref 101–111)
CO2: 23 mmol/L (ref 22–32)
Calcium: 8.8 mg/dL — ABNORMAL LOW (ref 8.9–10.3)
Creatinine, Ser: 0.59 mg/dL — ABNORMAL LOW (ref 0.61–1.24)
Glucose, Bld: 101 mg/dL — ABNORMAL HIGH (ref 65–99)
POTASSIUM: 3.7 mmol/L (ref 3.5–5.1)
Sodium: 137 mmol/L (ref 135–145)
Total Bilirubin: 3 mg/dL — ABNORMAL HIGH (ref 0.3–1.2)
Total Protein: 7.1 g/dL (ref 6.5–8.1)

## 2018-02-13 LAB — CBC
HCT: 33.9 % — ABNORMAL LOW (ref 39.0–52.0)
Hemoglobin: 10.4 g/dL — ABNORMAL LOW (ref 13.0–17.0)
MCH: 28 pg (ref 26.0–34.0)
MCHC: 30.7 g/dL (ref 30.0–36.0)
MCV: 91.4 fL (ref 78.0–100.0)
PLATELETS: 292 10*3/uL (ref 150–400)
RBC: 3.71 MIL/uL — ABNORMAL LOW (ref 4.22–5.81)
RDW: 18.4 % — AB (ref 11.5–15.5)
WBC: 7.2 10*3/uL (ref 4.0–10.5)

## 2018-02-13 LAB — PROTIME-INR
INR: 1.28
PROTHROMBIN TIME: 15.9 s — AB (ref 11.4–15.2)

## 2018-02-13 LAB — BRAIN NATRIURETIC PEPTIDE: B NATRIURETIC PEPTIDE 5: 291 pg/mL — AB (ref 0.0–100.0)

## 2018-02-13 MED ORDER — ONDANSETRON HCL 4 MG/2ML IJ SOLN
4.0000 mg | Freq: Four times a day (QID) | INTRAMUSCULAR | Status: DC | PRN
  Administered 2018-02-13: 4 mg via INTRAVENOUS
  Filled 2018-02-13: qty 2

## 2018-02-13 MED ORDER — DOCUSATE SODIUM 100 MG PO CAPS
100.0000 mg | ORAL_CAPSULE | Freq: Two times a day (BID) | ORAL | Status: DC
  Administered 2018-02-14 (×2): 100 mg via ORAL
  Filled 2018-02-13 (×2): qty 1

## 2018-02-13 MED ORDER — TRAMADOL HCL 50 MG PO TABS
100.0000 mg | ORAL_TABLET | Freq: Four times a day (QID) | ORAL | Status: DC | PRN
  Administered 2018-02-13 – 2018-02-14 (×2): 100 mg via ORAL
  Filled 2018-02-13 (×2): qty 2

## 2018-02-13 MED ORDER — ONDANSETRON HCL 4 MG PO TABS: 4.0000 mg | ORAL_TABLET | Freq: Four times a day (QID) | ORAL | Status: DC | PRN

## 2018-02-13 MED ORDER — SODIUM CHLORIDE 0.9% FLUSH
3.0000 mL | INTRAVENOUS | Status: DC | PRN
Start: 2018-02-13 — End: 2018-02-15

## 2018-02-13 MED ORDER — IBUPROFEN 400 MG PO TABS
400.0000 mg | ORAL_TABLET | Freq: Once | ORAL | Status: AC
  Administered 2018-02-13: 400 mg via ORAL
  Filled 2018-02-13: qty 1

## 2018-02-13 MED ORDER — TRAZODONE HCL 50 MG PO TABS
50.0000 mg | ORAL_TABLET | Freq: Every evening | ORAL | Status: DC | PRN
  Administered 2018-02-14: 50 mg via ORAL
  Filled 2018-02-13: qty 1

## 2018-02-13 MED ORDER — BUPRENORPHINE HCL-NALOXONE HCL 8-2 MG SL SUBL
1.0000 | SUBLINGUAL_TABLET | Freq: Every day | SUBLINGUAL | Status: DC
  Administered 2018-02-14: 1 via SUBLINGUAL
  Filled 2018-02-13: qty 1

## 2018-02-13 MED ORDER — AMOXICILLIN 500 MG PO CAPS
1000.0000 mg | ORAL_CAPSULE | Freq: Two times a day (BID) | ORAL | Status: DC
  Administered 2018-02-13 – 2018-02-14 (×3): 1000 mg via ORAL
  Filled 2018-02-13 (×4): qty 2

## 2018-02-13 MED ORDER — ACETAMINOPHEN 650 MG RE SUPP: 650.0000 mg | Freq: Four times a day (QID) | RECTAL | Status: DC | PRN

## 2018-02-13 MED ORDER — MELATONIN 3 MG PO TABS
3.0000 mg | ORAL_TABLET | Freq: Every day | ORAL | Status: DC | PRN
  Administered 2018-02-13: 3 mg via ORAL
  Filled 2018-02-13: qty 1

## 2018-02-13 MED ORDER — SODIUM CHLORIDE 0.9 % IV SOLN: 250.0000 mL | INTRAVENOUS | Status: DC | PRN

## 2018-02-13 MED ORDER — SODIUM CHLORIDE 0.9% FLUSH
3.0000 mL | Freq: Two times a day (BID) | INTRAVENOUS | Status: DC
  Administered 2018-02-14: 3 mL via INTRAVENOUS

## 2018-02-13 MED ORDER — FUROSEMIDE 10 MG/ML IJ SOLN
20.0000 mg | Freq: Once | INTRAMUSCULAR | Status: AC
  Administered 2018-02-13: 20 mg via INTRAVENOUS
  Filled 2018-02-13: qty 2

## 2018-02-13 MED ORDER — ACETAMINOPHEN 325 MG PO TABS: 650.0000 mg | ORAL_TABLET | Freq: Four times a day (QID) | ORAL | Status: DC | PRN

## 2018-02-13 MED ORDER — BUPROPION HCL ER (XL) 150 MG PO TB24
150.0000 mg | ORAL_TABLET | Freq: Every day | ORAL | Status: DC
  Administered 2018-02-13 – 2018-02-14 (×2): 150 mg via ORAL
  Filled 2018-02-13 (×2): qty 1

## 2018-02-13 NOTE — Progress Notes (Signed)
Admit from ED , alert, oriented x4.

## 2018-02-13 NOTE — ED Notes (Deleted)
Admitting MD at bedside.

## 2018-02-13 NOTE — H&P (Signed)
History and Physical    Marcus [name] QTM:[id number] DOB: [birth year] DOA: 02/24/2018  PCP: Patient, No Pcp Per  Patient coming from: the streets  I have personally extensively reviewed patient's old medical records in [place] and Epic care everywhere  Chief Complaint: Continued shortness of breath worse over the past 24 hours  HPI: Marcus [name] is a 42 y.o. 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  He received 20 pages of discharge instructions and a walker.  He does not appear to be able to manage anything on his own at this point.  The patient presents short of breath confused and unable to manage himself.  He states that his shortness of breath has worsened since from the hospital.  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  He had a vegetation on the posterior leaflet of the mitral valve.  He was found to have a subtotal occlusion of his aorta at the level of the IMA and has been on anticoagulation with Coumadin for this.  He had an MRI of the brain which revealed multifocal subacute bilateral MCA watershed territory infarcts and right frontal high convexity small amount of hemorrhaging.  By vascular surgery at [facility] and they recommended conservative management with no surgical intervention.  They noted that his prognosis was poor and he had a fatal condition with no surgical cure.  Palliative consultation was recommended.  He then left that facility [name] and presented to our facility on the 15th and ended up admitted to our facility for generalized weakness, dizziness and back pain.  He has  known bacterial endocarditis involving the mitral valve associated with moderate sized vegetation and severe mitral regurgitation and evidence of multiple different episodes of septic embolization to the brain, spleen, kidneys, and abdominal aorta.  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  He was deemed not a candidate for vascular repair of his abdominal aortic aneurysm due to the inability to use synthetic grafts.  This is due to his underlying endocarditis.  He is also deemed not a surgical candidate for mitral valve intervention by Dr. Roxy Manns because of an extremely poor prognosis associated with a very large abdominal aortic aneurysm that is mycotic.  Therefore he is not a candidate for surgical management of his aortic aneurysm and is not a candidate for surgical management of his mitral valve and therefore both vascular surgery and CT surgery have recommended palliative care for his fatal problem.  Patient states he does not remember speaking with palliative care his last admission but does remember wanting to be a DO NOT RESUSCITATE.  In the presence of his nurse Angela Nevin from the emergency department I had an extensive discussion with the patient regarding his management options which are very limited.  Clearly this patient is failing in the outpatient environment as he has now been admitted 4 times to the hospital and is not able to stay well enough to be an outpatient.  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  He needs palliative care and probably hospice to  treat his symptoms and accompany him along his journey.  At this point there is no more intervention that can be done to save his life, or prolong it.  At this point our goals need to be geared towards compassionate, competent, high quality paly of care.  He likely needs hospice care.  Patient  will be admitted to [facility] service on the palliative care unit we will consult palliative care for further assistance.  I do not think that until the patient has a defined location for discharge such as a skilled unit or an inpatient hospice unit that he should be discharged.  If he were discharged back to the street he will likely die a miserable death on the streets of [place].  Patient plans to get in contact with his mother who has multiple myeloma in [place].  Review of Systems: As per HPI otherwise all other systems reviewed and  negative.    Past Medical History:  Diagnosis Date  . Hepatitis B 12/29/2017   at [facility]  . Hepatitis C   . Occluded aorta ([place]) 12/29/2017   at [facility], occluded at IMA level  . 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 APPENDECTOMY    . IR ANGIO INTRA EXTRACRAN SEL COM CAROTID INNOMINATE BILAT MOD SED  01/18/2018  . IR ANGIO VERTEBRAL SEL SUBCLAVIAN INNOMINATE UNI L MOD SED  01/18/2018  . IR ANGIO VERTEBRAL SEL VERTEBRAL UNI R MOD SED  01/18/2018    Social History   Social History Narrative   Homeless     reports that he has been smoking cigarettes.  He has been smoking about 0.50 packs per day. He has never used smokeless tobacco. He reports that he has current or past drug history. Drugs: Methamphetamines, Heroin, and IV. Frequency: 7.00 times per week. He reports that he does not drink alcohol.  Allergies  Allergen Reactions  . Bee Venom Anaphylaxis  . Food Hives and Other (See Comments)    grapefruit  . Tylenol [Acetaminophen] Other (See Comments)    Pt states that he is not supposed to take this medication because of his Hep C.     Family History  Problem Relation Age of Onset  . Multiple myeloma Mother     Prior to Admission medications     Medication Sig Start Date End Date Taking? Authorizing Provider  amoxicillin (AMOXIL) 500 MG capsule Take 2 capsules (1,000 mg total) by mouth 2 (two) times daily. X 6 MONTHS Patient not taking: Reported on 02/10/2018 02/09/18   Rai, Vernelle Emerald, MD  buprenorphine-naloxone (SUBOXONE) 8-2 mg SUBL SL tablet Place 1 tablet under the tongue daily for 7 days. Further refill by IM suboxone clinic follow-up 02/09/18 02/16/18  Rai, Vernelle Emerald, MD  buPROPion (WELLBUTRIN XL) 150 MG 24 hr tablet Take 1 tablet (150 mg total) by mouth daily. Patient not taking: Reported on 02/10/2018 02/09/18   Rai, Vernelle Emerald, MD  MELATONIN PO Take 2 tablets by mouth daily as needed (Sleep).    [provider]    Physical Exam:  Constitutional: NAD, calm, uncomfortable with discomfort in his back likely related to splenic bony lesions Vitals:   02/08/2018 1154 02/20/2018 1200 02/10/2018 1300 02/06/2018 1500  BP:  100/71 (!) 108/27 119/83  Pulse:  98 92 94  Resp:  (!)  25 20 (!) 26  Temp:  98.5 F (36.9 C)    TempSrc:  Oral    SpO2: 100% 98% 100% 99%   Eyes: PERRL, lids and conjunctivae normal ENMT: Mucous membranes are moist. Posterior pharynx clear of any exudate or lesions.Normal dentition.  Neck: normal, supple, no masses, no thyromegaly Respiratory: clear to auscultation bilaterally, no wheezing, no crackles. Normal respiratory effort. No accessory muscle use.  Cardiovascular: Regular rate and rhythm, 4/6 holosystolic murmur loudest at the apex/ rubs / gallops.  1+ extremity edema. 2+ pedal pulses. No carotid bruits.  Abdomen: no tenderness, no masses palpated. No hepatosplenomegaly. Bowel sounds positive.  Musculoskeletal: no clubbing / cyanosis. No joint deformity upper and lower extremities. Good ROM, no contractures. Normal muscle tone.  Skin: no rashes, lesions, ulcers. No induration Neurologic: CN 2-12 grossly intact. Sensation intact, DTR normal. Strength 5/5 in all 4.  Psychiatric: Normal judgment and  insight. Alert and oriented x 3. Normal mood.    Labs on Admission: I have personally reviewed following labs and imaging studies  CBC: Recent Labs  Lab 02/09/18 0437 02/09/18 2205 01/30/2018 1241  WBC 4.6 7.8 7.2  NEUTROABS  --  5.5  --   HGB 10.5* 11.8* 10.4*  HCT 34.6* 39.5 33.9*  MCV 90.6 91.6 91.4  PLT 289 395 509   Basic Metabolic Panel: Recent Labs  Lab 02/09/18 0437 02/09/18 2209 02/21/2018 1241  NA 137 136 137  K 3.8 3.9 3.7  CL 103 101 105  CO2 25 25 23   GLUCOSE 96 140* 101*  BUN 9 13 <5*  CREATININE 0.62 0.89 0.59*  CALCIUM 9.1 9.7 8.8*   GFR: Estimated Creatinine Clearance: 110.2 mL/min (A) (by C-G formula based on SCr of 0.59 mg/dL (L)). Liver Function Tests: Recent Labs  Lab 02/11/2018 1241  AST 713*  ALT 1,031*  ALKPHOS 268*  BILITOT 3.0*  PROT 7.1  ALBUMIN 2.2*   No results for input(s): LIPASE, AMYLASE in the last 168 hours. No results for input(s): AMMONIA in the last 168 hours. Coagulation Profile: Recent Labs  Lab 02/10/18 0216 02/08/2018 1241  INR 1.47 1.28   Urine analysis:    Component Value Date/Time   COLORURINE AMBER (A) 02/09/2018 2259   APPEARANCEUR CLOUDY (A) 02/09/2018 2259   LABSPEC 1.028 02/09/2018 2259   PHURINE 5.0 02/09/2018 2259   GLUCOSEU NEGATIVE 02/09/2018 2259   HGBUR NEGATIVE 02/09/2018 2259   BILIRUBINUR SMALL (A) 02/09/2018 2259   KETONESUR 5 (A) 02/09/2018 2259   PROTEINUR 30 (A) 02/09/2018 2259   NITRITE NEGATIVE 02/09/2018 2259   LEUKOCYTESUR NEGATIVE 02/09/2018 2259    Radiological Exams on Admission: Dg Chest 2 View  Result Date: 02/06/2018 CLINICAL DATA:  Shortness of breath. EXAM: CHEST - 2 VIEW COMPARISON:  None FINDINGS: Normal heart size. Increase interstitial lung markings are identified bilaterally. Trace pleural effusions. No airspace opacities identified. Chronic interstitial coarsening throughout both lungs noted. IMPRESSION: Mild pulmonary edema and small pleural effusions. Electronically  Signed   By: Kerby Moors M.D.   On: 02/17/2018 13:26    EKG: Independently reviewed.  Sinus rhythm with VPCs, left atrial enlargement and axis deviation when compared to previous there is no significant change.  Assessment/Plan Principal Problem:   Aortic occlusion (HCC) Active Problems:   Endocarditis of mitral valve   Severe mitral regurgitation   Goals of care, counseling/discussion   SOB (shortness of breath)   Septic embolism (HCC)   Cerebral embolism with cerebral infarction   Nontraumatic cortical hemorrhage of  right cerebral hemisphere [facility])   Polysubstance dependence including opioid type drug without complication, episodic abuse ([place])   Severe episode of recurrent major depressive disorder, with psychotic features ([place])   Chronic viral hepatitis B without delta-agent ([place])   Tobacco abuse   Cognitive deficits   1.  Aortic occlusion: This is a fatal diagnosis given the inability to fix the associated aneurysm with synthetic materials as the patient has endocarditis.  It is further complicated by the occlusion being caused by a mycotic embolism also leading to the aneurysm.  Palliative care is recommended and will be initiated.  2.  Endocarditis of mitral Valve: Mycotic causing severe mitral valve regurgitation of the posterior leaflet and embolism to the aorta causing aneurysm and occlusion of the abdominal aorta with occlusion of the iliac vessels as well - nonoperative please see below.  3.  Severe mitral regurgitation of the posterior leaflet: Nonoperative: Palliative care and hospice suggested to see consultations by Drs. ON and Dr. Vallarie Mare on Jan 22, 2018 in our records.  4.  Goals of care and counseling discussion: In the presence of the patient's nurse Angela Nevin I had an extensive discussion with the patient about the fatal nature of his situation.  He appears to have limited understanding but has a better grasp of the situation after our discussion.  I asked him on multiple  occasions if he understood he said he did he said he did need to talk to his mother.  He did reiterate that he wanted to be a DO NOT RESUSCITATE.  I discussed with the patient that I felt his best option was to be admitted to the hospital to the palliative care service with plans for comfort measures and possible hospice.  The limiting factor at this point is his Medicaid pending status which at this point will prevent him from being able to get a skilled facility until that is achieved.  Social work will be consulted as well as case Mudlogger.  5.  Shortness of breath: This is the patient's presenting symptom we will treat it symptomatically with Lasix nebulizers and if necessary morphine.  6.  Septic embolism to the aorta: As noted above.  7.  Cerebral embolism with cerebral infarction: This is also septic and noted on his admission at [facility] in April.  8.  Nontraumatic cortical hemorrhage of right cerebral hemisphere: Also noted on his admission in April do not suspect any current extension of the hemorrhage.  9.  Polysubstance dependence including opioid type drug without complication: He is currently on Suboxone.  Pain management further per recommendations of palliative care team.  10.  Severe episodes of recurrent major depressive disorder with psychotic features: Continue outpatient medications patient will need a great deal of support both emotionally psychologically and spiritually during this very trying time.  11.  Chronic viral hepatitis B without delta agent: Noted at this point we will use Tylenol because risk of pain and discomfort outweighs risk of damage to his liver.  12.  Tobacco abuse: Noted will attempt nicotine patch if patient desires.  13.  Cognitive deficits.  Patient has difficulty remembering although he gains his ability of high functioning reasoning and logic.  He has very clearly reiterated his wishes for not being resuscitated not kept alive on a  machine.  I did explain to him if he ended up on a ventilator that his family would ultimately have to make the decision to remove life support.  He asked if he would  recover and start breathing on his own and I explained to him that due to his mitral valve and his aneurysm these are fatal problems.  That if he had a life ending event we would not be able to reverse the underlying fatal problems and thus putting him on a ventilator would not enable him to survive.    DVT prophylaxis: Not ordered due to palliative care Code Status: DO NOT RESUSCITATE Family Communication: No family present at the time of admission.  Patient will communicate with his mother. Disposition Plan: Likely a skilled nursing facility or a hospice facility. Consults called: Palliative care Admission status: Inpatient   Lady Deutscher MD FACP [facility] Pager [phone]  If 7PM-7AM, please contact night-coverage [email or web address] Password [facility]  02/22/2018, 4:03 PM

## 2018-02-13 NOTE — ED Triage Notes (Signed)
To room via EMS.  Pt d/c yesterday from this hospital.  Onset today pt walked to store and shortness of breath worsened.  Talking in complete sentences.

## 2018-02-13 NOTE — ED Notes (Signed)
Report given to rn on 6n      

## 2018-02-13 NOTE — ED Notes (Signed)
Admitting MD at bedside.

## 2018-02-13 NOTE — ED Provider Notes (Signed)
[facility] EMERGENCY DEPARTMENT Provider Note   CSN: [id number] Arrival date & time: 02/04/2018  1150     History   Chief Complaint Chief Complaint  Patient presents with  . Shortness of Breath    HPI Nicolo Tomko is a 42 y.o. male.  Patient with hx chr hep b/c, occluded aorta, endocarditis, presents c/o increased sob s/p recent d/c from hospital. Notes progressive fatigue, weakness, and dyspnea w minimal exertion that he feels is worse that during recent admission. Denies chest pain. No fever or chills. Has noted increased leg/ankle edema bil. Hx ivda, but denies any recent use. States compliant w meds including antibiotic. Denies increased cough or uri symptoms.   The history is provided by the patient.  Shortness of Breath  Pertinent negatives include no fever, no headaches, no sore throat, no neck pain, no chest pain, no abdominal pain and no rash.    Past Medical History:  Diagnosis Date  . Hepatitis B 12/29/2017   at [facility]  . Hepatitis C   . 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 Polysubstance abuse ([place]) 02/10/2018  . Tobacco abuse 02/10/2018  . SOB (shortness of breath) 02/10/2018  . Anemia of chronic disease 02/10/2018  . Elevated lactic acid level 02/10/2018  . Pressure injury of skin 02/08/2018  . Malnutrition of moderate degree 02/02/2018  . Depression   . Palliative care by specialist   . Advance care planning   . Goals of care, counseling/discussion   . Aneurysm ([place])   . Severe mitral regurgitation   . Chronic viral hepatitis B without delta-agent ([place]) 01/20/2018  . Chronic viral hepatitis C ([place]) 01/20/2018  . Back pain 01/20/2018  . Endocarditis of mitral valve   . Aortic occlusion  ([place])   . 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 Septic embolism ([place]) 01/14/2018  . Cerebral embolism with cerebral infarction 01/14/2018  . Severe episode of recurrent major depressive disorder, with psychotic features ([place])   . Bipolar 1 disorder, mixed, moderate ([place]) 06/29/2017  . Polysubstance dependence including opioid type drug without complication, episodic abuse ([place]) 07/02/2016    Past Surgical History:  Procedure Laterality Date  . APPENDECTOMY    . IR ANGIO INTRA EXTRACRAN SEL COM CAROTID INNOMINATE BILAT MOD SED  01/18/2018  . IR ANGIO VERTEBRAL SEL SUBCLAVIAN INNOMINATE UNI L MOD SED  01/18/2018  . IR ANGIO VERTEBRAL SEL VERTEBRAL UNI R MOD SED  01/18/2018        Home Medications    Prior to Admission medications   Medication Sig Start Date End Date Taking? Authorizing Provider  amoxicillin (AMOXIL) 500 MG capsule Take 2 capsules (1,000 mg total) by mouth 2 (two) times daily. X 6 MONTHS Patient not taking: Reported on 02/10/2018 02/09/18   Rai, Vernelle Emerald, MD  buprenorphine-naloxone (SUBOXONE) 8-2 mg SUBL SL tablet Place 1 tablet under the tongue daily for 7 days. Further refill by IM suboxone clinic follow-up 02/09/18 02/16/18  Rai, Vernelle Emerald, MD  buPROPion (WELLBUTRIN XL) 150 MG 24 hr tablet Take 1 tablet (150 mg total) by mouth daily. Patient not taking: Reported on 02/10/2018 02/09/18   Mendel Corning, MD  MELATONIN PO Take 2  tablets by mouth daily as needed (Sleep).    [provider]    Family History Family History  Problem Relation Age of Onset  . Multiple myeloma Mother     Social History Social History   Tobacco Use  . Smoking status: Current Every Day Smoker    Packs/day: 0.50    Types: Cigarettes  . Smokeless tobacco: Never Used  Substance Use Topics  . Alcohol use: No  . Drug use: Not Currently    Frequency: 7.0 times per week    Types: Methamphetamines, Heroin, IV    Comment: 02/08/2018 pt states "I have  been clean for 3 months, I used for 20 years"     Allergies   Bee venom; Food; and Tylenol [acetaminophen]   Review of Systems Review of Systems  Constitutional: Positive for fatigue. Negative for fever.  HENT: Negative for sore throat.   Eyes: Negative for redness.  Respiratory: Positive for shortness of breath.   Cardiovascular: Negative for chest pain.  Gastrointestinal: Negative for abdominal pain.  Genitourinary: Negative for flank pain.  Musculoskeletal: Negative for back pain and neck pain.  Skin: Negative for rash.  Neurological: Positive for weakness. Negative for headaches.  Hematological: Does not bruise/bleed easily.  Psychiatric/Behavioral: Negative for confusion.     Physical Exam Updated Vital Signs BP 100/71   Pulse 98   Temp 98.5 F (36.9 C) (Oral)   Resp (!) 25   SpO2 98%   Physical Exam  Constitutional: He appears well-developed and well-nourished.  Very frail, chronically-ill appearing.   HENT:  Mouth/Throat: Oropharynx is clear and moist.  Eyes: Conjunctivae are normal.  Neck: Neck supple. No tracheal deviation present.  Cardiovascular: Normal rate, regular rhythm and intact distal pulses. Exam reveals no gallop and no friction rub.  Murmur heard. Pulmonary/Chest: Effort normal and breath sounds normal. No accessory muscle usage. No respiratory distress.  Abdominal: Soft. Bowel sounds are normal. He exhibits no distension. There is no tenderness.  Musculoskeletal:  Mild bil lower leg/ankle edema.   Neurological: He is alert.  Skin: Skin is warm and dry.  Psychiatric: He has a normal mood and affect.  Nursing note and vitals reviewed.    ED Treatments / Results  Labs (all labs ordered are listed, but only abnormal results are displayed) Results for orders placed or performed during the hospital encounter of 01/30/2018  CBC  Result Value Ref Range   WBC 7.2 4.0 - 10.5 K/uL   RBC 3.71 (L) 4.22 - 5.81 MIL/uL   Hemoglobin 10.4 (L) 13.0 -  17.0 g/dL   HCT 33.9 (L) 39.0 - 52.0 %   MCV 91.4 78.0 - 100.0 fL   MCH 28.0 26.0 - 34.0 pg   MCHC 30.7 30.0 - 36.0 g/dL   RDW 18.4 (H) 11.5 - 15.5 %   Platelets 292 150 - 400 K/uL  Comprehensive metabolic panel  Result Value Ref Range   Sodium 137 135 - 145 mmol/L   Potassium 3.7 3.5 - 5.1 mmol/L   Chloride 105 101 - 111 mmol/L   CO2 23 22 - 32 mmol/L   Glucose, Bld 101 (H) 65 - 99 mg/dL   BUN <5 (L) 6 - 20 mg/dL   Creatinine, Ser 0.59 (L) 0.61 - 1.24 mg/dL   Calcium 8.8 (L) 8.9 - 10.3 mg/dL   Total Protein 7.1 6.5 - 8.1 g/dL   Albumin 2.2 (L) 3.5 - 5.0 g/dL   AST 713 (H) 15 - 41 U/L   ALT 1,031 (  H) 17 - 63 U/L   Alkaline Phosphatase 268 (H) 38 - 126 U/L   Total Bilirubin 3.0 (H) 0.3 - 1.2 mg/dL   GFR calc non Af Amer >60 >60 mL/min   GFR calc Af Amer >60 >60 mL/min   Anion gap 9 5 - 15   Dg Chest 2 View  Result Date: 02/17/2018 CLINICAL DATA:  Shortness of breath. EXAM: CHEST - 2 VIEW COMPARISON:  None FINDINGS: Normal heart size. Increase interstitial lung markings are identified bilaterally. Trace pleural effusions. No airspace opacities identified. Chronic interstitial coarsening throughout both lungs noted. IMPRESSION: Mild pulmonary edema and small pleural effusions. Electronically Signed   By: Kerby Moors M.D.   On: 02/04/2018 13:26   Dg Chest 2 View  Result Date: 02/09/2018 CLINICAL DATA:  Shortness of breath.  Surgery 3 weeks ago. EXAM: CHEST - 2 VIEW COMPARISON:  Jan 15, 2018 FINDINGS: The heart size and mediastinal contours are within normal limits. Both lungs are clear. The visualized skeletal structures are unremarkable. IMPRESSION: No active cardiopulmonary disease. Electronically Signed   By: Dorise Bullion III M.D   On: 02/09/2018 22:58   Ct Head Wo Contrast  Result Date: 01/24/2018 CLINICAL DATA:  Continued surveillance infarcts and hemorrhage. EXAM: CT HEAD WITHOUT CONTRAST TECHNIQUE: Contiguous axial images were obtained from the base of the skull through  the vertex without intravenous contrast. COMPARISON:  CT head 01/14/2018. MR head 01/14/2018. CT angiogram 01/18/2018. FINDINGS: Brain: Previously identified subacute infarcts are involving into areas of chronic ischemia affecting the BILATERAL RIGHT greater than LEFT subcortical white matter. Tiny focus of hyperattenuation, RIGHT frontal cortex, image 23 appears improved. This measures no more than 2 x 3 mm on today's exam. Vascular: Improved appearance of the previously identified RIGHT M2 occlusion. Some hyperattenuation of vessel wall in the sylvian fissure, see series 3, image 12. Skull: Normal. Negative for fracture or focal lesion. Sinuses/Orbits: No acute finding. Other: None. IMPRESSION: Previously identified subacute white matter infarcts are have evolved into areas of chronic ischemia. Tiny focus of hyperattenuation RIGHT frontal cortex is improved. Improved appearance of the previously identified RIGHT M2 occlusion. Electronically Signed   By: Staci Righter M.D.   On: 01/24/2018 17:38   Ct Angio Chest Pe W And/or Wo Contrast  Result Date: 02/10/2018 CLINICAL DATA:  Shortness of breath, streptococcal endocarditis. EXAM: CT ANGIOGRAPHY CHEST WITH CONTRAST TECHNIQUE: Multidetector CT imaging of the chest was performed using the standard protocol during bolus administration of intravenous contrast. Multiplanar CT image reconstructions and MIPs were obtained to evaluate the vascular anatomy. CONTRAST:  173m ISOVUE-370 IOPAMIDOL (ISOVUE-370) INJECTION 76% COMPARISON:  Chest radiograph 02/09/2018 FINDINGS: Cardiovascular: --Pulmonary arteries: Contrast injection is sufficient to demonstrate satisfactory opacification of the pulmonary arteries to the segmental level. There is no pulmonary embolus. The main pulmonary artery is within normal limits for size. --Aorta: Limited opacification of the aorta due to bolus timing optimization for the pulmonary arteries. Conventional 3 vessel aortic branching pattern.  The aortic course and caliber are normal. There is no aortic atherosclerosis. --Heart: Mild cardiomegaly. No pericardial effusion. Mediastinum/Nodes: No mediastinal, hilar or axillary lymphadenopathy. The visualized thyroid and thoracic esophageal course are unremarkable. Lungs/Pleura: There are scattered areas of ground-glass opacity, particularly in the right middle lobe and lingula. Upper Abdomen: Contrast bolus timing is not optimized for evaluation of the abdominal organs. Large area of hypoattenuation in the spleen is consistent with known splenic infarct. Musculoskeletal: No chest wall abnormality. No acute or significant osseous findings. Review of the  MIP images confirms the above findings. IMPRESSION: 1. No pulmonary embolus. 2. Multifocal ground-glass opacities within the lungs, predominantly in the right middle lobe and lingula. This may indicate multifocal pneumonia. 3. Known splenic infarct. This is incompletely imaged on this study. Electronically Signed   By: Ulyses Jarred M.D.   On: 02/10/2018 03:40   Ct Angio Abdomen W &/or Wo Contrast  Result Date: 01/21/2018 CLINICAL DATA:  Aorta occlusion EXAM: CT ANGIOGRAPHY ABDOMEN TECHNIQUE: Multidetector CT imaging of the abdomen was performed using the standard protocol during bolus administration of intravenous contrast. Multiplanar reconstructed images and MIPs were obtained and reviewed to evaluate the vascular anatomy. CONTRAST:  148m ISOVUE-370 IOPAMIDOL (ISOVUE-370) INJECTION 76% [facility]  [facility] FINDINGS: VASCULAR Aorta: The aorta is abruptly occluded just below the takeoff of the IMA. The borders of the abdominal aorta are poorly defined at the level of the occlusion. Mild aneurysmal dilatation of the distal aorta measuring up to 3.7 cm is suspected. At the level of the occlusion, there is mixed low-density and isodensity within the thrombus, as well as a lack of atherosclerotic calcification,  suggesting subacute rather than acute age. There is also a mild thickening of the wall of the aorta with stranding in the adjacent fat. These findings suggest an inflammatory process. Celiac: Patent and ectatic. There is abrupt occlusion of the splenic artery, 2 cm from the hilum of the spleen. Distal branches reconstitute. The hepatic artery and its branches are grossly patent. Gastroduodenal artery is patent. SMA: Patent and ectatic. Renals: 2 right renal arteries and a single left renal artery are patent. IMA: Patent. Inflow: Bilateral common iliac arteries are occluded. There is some filling of the mid and distal left common iliac artery with contrast flow. The left common iliac artery measures 1.9 cm in maximal caliber. Right common iliac artery measures 3.6 cm in caliber. The left internal and external iliac arteries reconstitute and are partially imaged. Veins: No evidence of DVT. Review of the MIP images confirms the above findings. NON-VASCULAR Lower chest: No acute abnormality. Hepatobiliary: Unremarkable Pancreas: Unremarkable Spleen: There is a large nonspecific there is a complex cystic lesion in the spleen measuring up to 11.6 cm. Adrenals/Urinary Tract: There is scarring in the lower pole of the left kidney associated with wedge-shaped areas of low density. These findings likely represent small infarcts. This is also present in the upper pole of the right kidney to a lesser degree. Stomach/Bowel: No obvious mass in the colon. No evidence of small-bowel obstruction. Lymphatic: Small para-aortic lymph nodes are present. Other: No free fluid.  Postoperative changes from appendectomy. Musculoskeletal: No vertebral compression deformity. Bilateral L5 pars defects. No evidence of anterolisthesis. IMPRESSION: VASCULAR There is abrupt occlusion of the aorta below the level of the IMA as described on the prior report. Mixed density within the aorta suggest subacute age. Also, there is thickening and  inflammatory change of the wall of the aorta suggesting vasculitis or mycotic aneurysm. There is also associated occlusion of the bilateral common iliac arteries. Focal occlusion of the splenic artery. NON-VASCULAR Complex cystic lesion in the spleen is nonspecific. Small infarcts in scarring in the kidneys bilaterally. Electronically Signed   By: AMarybelle KillingsM.D.   On: 01/21/2018 12:05   Mr MJodene NamHead Wo Contrast  Result Date: 01/14/2018 CLINICAL DATA:  In hospital for sepsis, altered mental status. History of hepatitis-C, polysubstance abuse and endocarditis. History of head injury 1 month ago. Follow-up RIGHT MCA stroke. EXAM:  MRI HEAD WITHOUT CONTRAST MRA HEAD WITHOUT CONTRAST TECHNIQUE: Multiplanar, multiecho pulse sequences of the brain and surrounding structures were obtained without intravenous contrast. Angiographic images of the head were obtained using MRA technique without contrast. COMPARISON:  CT HEAD Jan 14, 2018 at 0951 hours. FINDINGS: MRI HEAD FINDINGS Multiple sequences are mildly or moderately motion degraded. INTRACRANIAL CONTENTS: Patchy reduced diffusion RIGHT frontal lobe and RIGHT basal ganglia, LEFT posterior frontal lobe with normalized ADC values and focal T2 shine through. Associated FLAIR T2 hyperintense signal and faint hemosiderin staining. Slightly expanded FLAIR T2 hyperintense RIGHT basal ganglia. Superimposed focal susceptibility artifact bifrontal lobes, RIGHT parietooccipital lobe. Multiple additional scattered micro hemorrhages. Mild parenchymal brain volume loss. No hydrocephalus. Old small RIGHT cerebellar infarcts. VASCULAR: Susceptibility artifact and T1 shortening RIGHT sylvian fissure corresponding to MCA density. SKULL AND UPPER CERVICAL SPINE: No abnormal sellar expansion. No suspicious calvarial bone marrow signal. Craniocervical junction maintained. SINUSES/ORBITS: The mastoid air-cells and included paranasal sinuses are well-aerated.The included ocular globes and  orbital contents are non-suspicious. OTHER: None. MRA HEAD FINDINGS ANTERIOR CIRCULATION: Normal flow related enhancement of the included cervical, petrous, cavernous and supraclinoid internal carotid arteries. Patent anterior communicating artery. Mildly attenuated RIGHT MCA flow related enhancement, occluded RIGHT M2 segment with transient aneurysmal reconstitution. No flow limiting stenosis. POSTERIOR CIRCULATION: vertebral artery is dominant. Basilar artery is patent, with normal flow related enhancement of the main branch vessels. Patent posterior cerebral arteries. Robust RIGHT and smaller LEFT posterior communicating arteries present. No large vessel occlusion, flow limiting stenosis,  aneurysm. ANATOMIC VARIANTS: None. Source images and MIP images were reviewed. IMPRESSION: MRI HEAD: 1. Multifocal subacute bilateral MCA/watershed territory infarcts. 2. Peripheral susceptibility artifact, septic emboli suspected. There may be superimposed shear injury or underlying chronic hypertension. 3. Mild parenchymal brain volume loss for age. 4. Old small RIGHT cerebellar infarcts. MRA HEAD: 1. 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 2. Otherwise negative MRA head. Examination reviewed by Dr. Lars Pinks with Dr. Rory Percy, Neurology on Jan 14, 2018 at approximately 1425 hours. Electronically Signed   By: Elon Alas M.D.   On: 01/14/2018 14:38   Mr Brain Wo Contrast  Result Date: 01/14/2018 CLINICAL DATA:  In hospital for sepsis, altered mental status. History of hepatitis-C, polysubstance abuse and endocarditis. History of head injury 1 month ago. Follow-up RIGHT MCA stroke. EXAM: MRI HEAD WITHOUT CONTRAST MRA HEAD WITHOUT CONTRAST TECHNIQUE: Multiplanar, multiecho pulse sequences of the brain and surrounding structures were obtained without intravenous contrast. Angiographic images of the head were obtained using MRA  technique without contrast. COMPARISON:  CT HEAD Jan 14, 2018 at 0951 hours. FINDINGS: MRI HEAD FINDINGS Multiple sequences are mildly or moderately motion degraded. INTRACRANIAL CONTENTS: Patchy reduced diffusion RIGHT frontal lobe and RIGHT basal ganglia, LEFT posterior frontal lobe with normalized ADC values and focal T2 shine through. Associated FLAIR T2 hyperintense signal and faint hemosiderin staining. Slightly expanded FLAIR T2 hyperintense RIGHT basal ganglia. Superimposed focal susceptibility artifact bifrontal lobes, RIGHT parietooccipital lobe. Multiple additional scattered micro hemorrhages. Mild parenchymal brain volume loss. No hydrocephalus. Old small RIGHT cerebellar infarcts. VASCULAR: Susceptibility artifact and T1 shortening RIGHT sylvian fissure corresponding to MCA density. SKULL AND UPPER CERVICAL SPINE: No abnormal sellar expansion. No suspicious calvarial bone marrow signal. Craniocervical junction maintained. SINUSES/ORBITS: The mastoid air-cells and included paranasal sinuses are well-aerated.The included ocular globes and orbital contents are non-suspicious. OTHER: None. MRA HEAD FINDINGS ANTERIOR CIRCULATION: Normal flow related enhancement of the included  cervical, petrous, cavernous and supraclinoid internal carotid arteries. Patent anterior communicating artery. Mildly attenuated RIGHT MCA flow related enhancement, occluded RIGHT M2 segment with transient aneurysmal reconstitution. No flow limiting stenosis. POSTERIOR CIRCULATION: vertebral artery is dominant. Basilar artery is patent, with normal flow related enhancement of the main branch vessels. Patent posterior cerebral arteries. Robust RIGHT and smaller LEFT posterior communicating arteries present. No large vessel occlusion, flow limiting stenosis,  aneurysm. ANATOMIC VARIANTS: None. Source images and MIP images were reviewed. IMPRESSION: MRI HEAD: 1. Multifocal subacute bilateral MCA/watershed territory infarcts. 2. Peripheral  susceptibility artifact, septic emboli suspected. There may be superimposed shear injury or underlying chronic hypertension. 3. Mild parenchymal brain volume loss for age. 4. Old small RIGHT cerebellar infarcts. MRA HEAD: 1. 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 2. Otherwise negative MRA head. Examination reviewed by Dr. Lars Pinks with Dr. Rory Percy, Neurology on Jan 14, 2018 at approximately 1425 hours. Electronically Signed   By: Elon Alas M.D.   On: 01/14/2018 14:38   Dg Chest Port 1 View  Result Date: 01/15/2018 CLINICAL DATA:  Shortness of breath.  Acute mental status change. EXAM: PORTABLE CHEST 1 VIEW COMPARISON:  Jan 14, 2018 FINDINGS: No pneumothorax. Vascular crowding in the medial right lung base. No suspicious infiltrate. Mild atelectasis in the left base. The cardiomediastinal silhouette is stable. No other changes. IMPRESSION: Mild atelectasis in the left base.  No other significant change. Electronically Signed   By: Dorise Bullion III M.D   On: 01/15/2018 07:40   Ir Angio Intra Extracran Sel Com Carotid Innominate Bilat Mod Sed  Result Date: 01/19/2018 CLINICAL DATA:  Focal right frontal hemorrhage. 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 EXAM: IR ANGIO VERTEBRAL SEL VERTEBRAL UNI RIGHT MOD SED; IR ULTRASOUND GUIDANCE VASC ACCESS RIGHT; IR ANGIO VERTEBRAL SEL SUBCLAVIAN INNOMINATE UNI LEFT MOD SED; BILATERAL COMMON CAROTID AND INNOMINATE ANGIOGRAPHY COMPARISON:  MRI MRA of the brain of 01/14/2018. MEDICATIONS: Heparin 0 units IV; no antibiotic was administered within 1 hour of the procedure. ANESTHESIA/SEDATION: Versed 0.5 mg IV; Fentanyl 37.5 mcg IV. Moderate Sedation Time:  75 minutes. The patient was continuously monitored during the procedure by the interventional radiology nurse under my direct supervision. CONTRAST:  Isovue 300 approximately 60 mL. FLUOROSCOPY  TIME:  Fluoroscopy Time: 8 minutes 42 seconds (822 mGy). COMPLICATIONS: None immediate. TECHNIQUE: Informed written consent was obtained from the patient after a thorough discussion of the procedural risks, benefits and alternatives. All questions were addressed. Maximal Sterile Barrier Technique was utilized including caps, mask, sterile gowns, sterile gloves, sterile drape, hand hygiene and skin antiseptic. A timeout was performed prior to the initiation of the procedure. The right antecubital fossa was prepped and draped in the usual sterile fashion. Thereafter using modified Seldinger technique, transbrachial access into the right brachial artery was obtained without difficulty. Over a 0.035 inch guidewire, a 5 French Pinnacle sheath was inserted. Through this, and also over 0.035 inch guidewire, a 5 French Simmons 2 catheter was advanced to the aortic arch region and formed without difficulty. It was selectively positioned in the right common carotid artery, the right vertebral artery, the left common carotid artery and the left subclavian artery. FINDINGS: The right vertebral artery origin is widely patent. The vessel is seen to opacify normally to the cranial skull base. Wide patency is seen of the right vertebrobasilar junction and the right posterior-inferior cerebellar artery. The opacified portion of the basilar  artery, the posterior cerebral arteries, the superior cerebellar arteries and the anterior-inferior cerebellar arteries appear normal into the capillary and venous phases. Non-opacified blood is seen in the basilar artery from contralateral vertebral artery. The left common carotid arteriogram demonstrates the left external carotid artery and its major branches to be widely patent. The left internal carotid artery at the bulb to the cranial skull base is widely patent. There is mild tortuosity in the proximal 1/3 of the left internal carotid artery. The petrous, cavernous and the supraclinoid  segments are widely patent. The left middle cerebral artery and the left anterior cerebral artery opacify normally into the capillary and venous phases. The venous phase demonstrates retrograde opacification of the left transverse sinus in its proximal 2/3 primarily from the ipsilateral vein of [place]. The right common carotid arteriogram demonstrates the right external carotid artery and its major branches to be widely patent. The right internal carotid artery at the bulb to the cranial skull base is widely patent. There is a double U shaped tortuosity of the middle 1/3 of the right internal carotid artery without evidence of kinking. More distally, the right internal carotid artery is seen to opacify normally to the cranial skull base. The distal petrous, and the proximal cavernous segments demonstrate mild fusiform dilatation. Distal to this the supraclinoid right ICA is normally patent. The right middle cerebral artery and the right anterior cerebral artery demonstrate normal opacification into the capillary and venous phases. The right transverse sinus consistently demonstrates focal areas of moderate irregularity with questionable stenosis at its distal aspect. The left vertebral artery origin demonstrates mild stenosis. The vessel is seen to opacify normally to the cranial skull base. Wide patency is seen of the left vertebrobasilar junction and the left posterior-inferior cerebellar artery. The opacified portion of the distal left vertebrobasilar junction, the basilar artery, the posterior cerebral arteries, the superior cerebellar arteries and the anterior-cerebellar arteries is grossly patent into the delayed arterial phase. IMPRESSION: Angiographically no evidence of intracranial aneurysms, arteriovenous malformations, dural AV fistula, dissections, or of intraluminal filling defects. Venous outflow demonstrates areas of caliber irregularity involving the right transverse sinus with suspicion of stenosis.  However, patient motion precludes fine detail in this region. PLAN: As per referring physician. Electronically Signed   By: Luanne Bras M.D.   On: 01/18/2018 13:10   Ir Angio Vertebral Sel Subclavian Innominate Uni L Mod Sed  Result Date: 01/19/2018 CLINICAL DATA:  Focal right frontal hemorrhage. 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 EXAM: IR ANGIO VERTEBRAL SEL VERTEBRAL UNI RIGHT MOD SED; IR ULTRASOUND GUIDANCE VASC ACCESS RIGHT; IR ANGIO VERTEBRAL SEL SUBCLAVIAN INNOMINATE UNI LEFT MOD SED; BILATERAL COMMON CAROTID AND INNOMINATE ANGIOGRAPHY COMPARISON:  MRI MRA of the brain of 01/14/2018. MEDICATIONS: Heparin 0 units IV; no antibiotic was administered within 1 hour of the procedure. ANESTHESIA/SEDATION: Versed 0.5 mg IV; Fentanyl 37.5 mcg IV. Moderate Sedation Time:  75 minutes. The patient was continuously monitored during the procedure by the interventional radiology nurse under my direct supervision. CONTRAST:  Isovue 300 approximately 60 mL. FLUOROSCOPY TIME:  Fluoroscopy Time: 8 minutes 42 seconds (822 mGy). COMPLICATIONS: None immediate. TECHNIQUE: Informed written consent was obtained from the patient after a thorough discussion of the procedural risks, benefits and alternatives. All questions were addressed. Maximal Sterile Barrier Technique was utilized including caps, mask, sterile gowns, sterile gloves, sterile drape, hand hygiene and skin antiseptic. A timeout was performed prior to the initiation of the procedure. The right antecubital fossa was  prepped and draped in the usual sterile fashion. Thereafter using modified Seldinger technique, transbrachial access into the right brachial artery was obtained without difficulty. Over a 0.035 inch guidewire, a 5 French Pinnacle sheath was inserted. Through this, and also over 0.035 inch guidewire, a 5 French Simmons 2 catheter was advanced to the aortic arch region and formed without difficulty. It was selectively  positioned in the right common carotid artery, the right vertebral artery, the left common carotid artery and the left subclavian artery. FINDINGS: The right vertebral artery origin is widely patent. The vessel is seen to opacify normally to the cranial skull base. Wide patency is seen of the right vertebrobasilar junction and the right posterior-inferior cerebellar artery. The opacified portion of the basilar artery, the posterior cerebral arteries, the superior cerebellar arteries and the anterior-inferior cerebellar arteries appear normal into the capillary and venous phases. Non-opacified blood is seen in the basilar artery from contralateral vertebral artery. The left common carotid arteriogram demonstrates the left external carotid artery and its major branches to be widely patent. The left internal carotid artery at the bulb to the cranial skull base is widely patent. There is mild tortuosity in the proximal 1/3 of the left internal carotid artery. The petrous, cavernous and the supraclinoid segments are widely patent. The left middle cerebral artery and the left anterior cerebral artery opacify normally into the capillary and venous phases. The venous phase demonstrates retrograde opacification of the left transverse sinus in its proximal 2/3 primarily from the ipsilateral vein of Labbe. The right common carotid arteriogram demonstrates the right external carotid artery and its major branches to be widely patent. The right internal carotid artery at the bulb to the cranial skull base is widely patent. There is a double U shaped tortuosity of the middle 1/3 of the right internal carotid artery without evidence of kinking. More distally, the right internal carotid artery is seen to opacify normally to the cranial skull base. The distal petrous, and the proximal cavernous segments demonstrate mild fusiform dilatation. Distal to this the supraclinoid right ICA is normally patent. The right middle cerebral artery  and the right anterior cerebral artery demonstrate normal opacification into the capillary and venous phases. The right transverse sinus consistently demonstrates focal areas of moderate irregularity with questionable stenosis at its distal aspect. The left vertebral artery origin demonstrates mild stenosis. The vessel is seen to opacify normally to the cranial skull base. Wide patency is seen of the left vertebrobasilar junction and the left posterior-inferior cerebellar artery. The opacified portion of the distal left vertebrobasilar junction, the basilar artery, the posterior cerebral arteries, the superior cerebellar arteries and the anterior-cerebellar arteries is grossly patent into the delayed arterial phase. IMPRESSION: Angiographically no evidence of intracranial aneurysms, arteriovenous malformations, dural AV fistula, dissections, or of intraluminal filling defects. Venous outflow demonstrates areas of caliber irregularity involving the right transverse sinus with suspicion of stenosis. However, patient motion precludes fine detail in this region. PLAN: As per referring physician. Electronically Signed   By: Luanne Bras M.D.   On: 01/18/2018 13:10   Ir Angio Vertebral Sel Vertebral Uni R Mod Sed  Result Date: 01/19/2018 CLINICAL DATA:  Focal right frontal hemorrhage. 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 EXAM: IR ANGIO VERTEBRAL SEL VERTEBRAL UNI RIGHT MOD SED; IR ULTRASOUND GUIDANCE VASC ACCESS RIGHT; IR ANGIO VERTEBRAL SEL SUBCLAVIAN INNOMINATE UNI LEFT MOD SED; BILATERAL COMMON CAROTID AND INNOMINATE ANGIOGRAPHY COMPARISON:  MRI MRA of the brain of 01/14/2018. MEDICATIONS: Heparin  0 units IV; no antibiotic was administered within 1 hour of the procedure. ANESTHESIA/SEDATION: Versed 0.5 mg IV; Fentanyl 37.5 mcg IV. Moderate Sedation Time:  75 minutes. The patient was continuously monitored during the procedure by the interventional radiology nurse under my direct  supervision. CONTRAST:  Isovue 300 approximately 60 mL. FLUOROSCOPY TIME:  Fluoroscopy Time: 8 minutes 42 seconds (822 mGy). COMPLICATIONS: None immediate. TECHNIQUE: Informed written consent was obtained from the patient after a thorough discussion of the procedural risks, benefits and alternatives. All questions were addressed. Maximal Sterile Barrier Technique was utilized including caps, mask, sterile gowns, sterile gloves, sterile drape, hand hygiene and skin antiseptic. A timeout was performed prior to the initiation of the procedure. The right antecubital fossa was prepped and draped in the usual sterile fashion. Thereafter using modified Seldinger technique, transbrachial access into the right brachial artery was obtained without difficulty. Over a 0.035 inch guidewire, a 5 French Pinnacle sheath was inserted. Through this, and also over 0.035 inch guidewire, a 5 French Simmons 2 catheter was advanced to the aortic arch region and formed without difficulty. It was selectively positioned in the right common carotid artery, the right vertebral artery, the left common carotid artery and the left subclavian artery. FINDINGS: The right vertebral artery origin is widely patent. The vessel is seen to opacify normally to the cranial skull base. Wide patency is seen of the right vertebrobasilar junction and the right posterior-inferior cerebellar artery. The opacified portion of the basilar artery, the posterior cerebral arteries, the superior cerebellar arteries and the anterior-inferior cerebellar arteries appear normal into the capillary and venous phases. Non-opacified blood is seen in the basilar artery from contralateral vertebral artery. The left common carotid arteriogram demonstrates the left external carotid artery and its major branches to be widely patent. The left internal carotid artery at the bulb to the cranial skull base is widely patent. There is mild tortuosity in the proximal 1/3 of the left  internal carotid artery. The petrous, cavernous and the supraclinoid segments are widely patent. The left middle cerebral artery and the left anterior cerebral artery opacify normally into the capillary and venous phases. The venous phase demonstrates retrograde opacification of the left transverse sinus in its proximal 2/3 primarily from the ipsilateral vein of Labbe. The right common carotid arteriogram demonstrates the right external carotid artery and its major branches to be widely patent. The right internal carotid artery at the bulb to the cranial skull base is widely patent. There is a double U shaped tortuosity of the middle 1/3 of the right internal carotid artery without evidence of kinking. More distally, the right internal carotid artery is seen to opacify normally to the cranial skull base. The distal petrous, and the proximal cavernous segments demonstrate mild fusiform dilatation. Distal to this the supraclinoid right ICA is normally patent. The right middle cerebral artery and the right anterior cerebral artery demonstrate normal opacification into the capillary and venous phases. The right transverse sinus consistently demonstrates focal areas of moderate irregularity with questionable stenosis at its distal aspect. The left vertebral artery origin demonstrates mild stenosis. The vessel is seen to opacify normally to the cranial skull base. Wide patency is seen of the left vertebrobasilar junction and the left posterior-inferior cerebellar artery. The opacified portion of the distal left vertebrobasilar junction, the basilar artery, the posterior cerebral arteries, the superior cerebellar arteries and the anterior-cerebellar arteries is grossly patent into the delayed arterial phase. IMPRESSION: Angiographically no evidence of intracranial aneurysms, arteriovenous malformations, dural AV  fistula, dissections, or of intraluminal filling defects. Venous outflow demonstrates areas of caliber  irregularity involving the right transverse sinus with suspicion of stenosis. However, patient motion precludes fine detail in this region. PLAN: As per referring physician. Electronically Signed   By: Luanne Bras M.D.   On: 01/18/2018 13:10    EKG None  Radiology Dg Chest 2 View  Result Date: 02/09/2018 CLINICAL DATA:  Shortness of breath. EXAM: CHEST - 2 VIEW COMPARISON:  None FINDINGS: Normal heart size. Increase interstitial lung markings are identified bilaterally. Trace pleural effusions. No airspace opacities identified. Chronic interstitial coarsening throughout both lungs noted. IMPRESSION: Mild pulmonary edema and small pleural effusions. Electronically Signed   By: Kerby Moors M.D.   On: 02/08/2018 13:26    Procedures Procedures (including critical care time)  Medications Ordered in ED Medications  furosemide (LASIX) injection 20 mg (has no administration in time range)     Initial Impression / Assessment and Plan / ED Course  I have reviewed the triage vital signs and the nursing notes.  Pertinent labs & imaging results that were available during my care of the patient were reviewed by me and considered in my medical decision making (see chart for details).  Iv ns. Labs. Cxr.  Reviewed nursing notes and prior charts for additional history.   Compared to recent prior - lfts now markedly elevated.   cxr reviewed - increased vasc congestion/small effusions compared to prior - lasix iv.   Reviewed nursing notes and prior charts for additional history - prior echo with significant mitral regurg. ?new failure, leading to increased sob, swelling.  Medical service consulted for admission.     Final Clinical Impressions(s) / ED Diagnoses   Final diagnoses:  None    ED Discharge Orders    None       Lajean Saver, MD 02/04/2018 1402

## 2018-02-13 NOTE — ED Notes (Signed)
Unable to give report  rn will call back 

## 2018-02-14 ENCOUNTER — Inpatient Hospital Stay (HOSPITAL_COMMUNITY): Payer: Medicaid Other

## 2018-02-14 DIAGNOSIS — R945 Abnormal results of liver function studies: Secondary | ICD-10-CM

## 2018-02-14 DIAGNOSIS — R4189 Other symptoms and signs involving cognitive functions and awareness: Secondary | ICD-10-CM

## 2018-02-14 DIAGNOSIS — B181 Chronic viral hepatitis B without delta-agent: Secondary | ICD-10-CM

## 2018-02-14 DIAGNOSIS — R52 Pain, unspecified: Secondary | ICD-10-CM

## 2018-02-14 DIAGNOSIS — Z515 Encounter for palliative care: Secondary | ICD-10-CM

## 2018-02-14 DIAGNOSIS — Z7189 Other specified counseling: Secondary | ICD-10-CM

## 2018-02-14 DIAGNOSIS — I058 Other rheumatic mitral valve diseases: Secondary | ICD-10-CM

## 2018-02-14 DIAGNOSIS — I63411 Cerebral infarction due to embolism of right middle cerebral artery: Secondary | ICD-10-CM

## 2018-02-14 DIAGNOSIS — F1920 Other psychoactive substance dependence, uncomplicated: Secondary | ICD-10-CM

## 2018-02-14 LAB — COMPREHENSIVE METABOLIC PANEL
ALK PHOS: 269 U/L — AB (ref 38–126)
ALT: 888 U/L — ABNORMAL HIGH (ref 17–63)
ANION GAP: 10 (ref 5–15)
AST: 454 U/L — ABNORMAL HIGH (ref 15–41)
Albumin: 2.2 g/dL — ABNORMAL LOW (ref 3.5–5.0)
BILIRUBIN TOTAL: 3.2 mg/dL — AB (ref 0.3–1.2)
BUN: 5 mg/dL — ABNORMAL LOW (ref 6–20)
CALCIUM: 9.1 mg/dL (ref 8.9–10.3)
CO2: 24 mmol/L (ref 22–32)
Chloride: 104 mmol/L (ref 101–111)
Creatinine, Ser: 0.64 mg/dL (ref 0.61–1.24)
GFR calc Af Amer: >60 mL/min (ref >60)
Glucose, Bld: 126 mg/dL — ABNORMAL HIGH (ref 65–99)
POTASSIUM: 3.8 mmol/L (ref 3.5–5.1)
Sodium: 138 mmol/L (ref 135–145)
TOTAL PROTEIN: 7.6 g/dL (ref 6.5–8.1)

## 2018-02-14 MED ORDER — LIDOCAINE 5 % EX PTCH
1.0000 | MEDICATED_PATCH | CUTANEOUS | Status: AC
  Administered 2018-02-14: 1 via TRANSDERMAL
  Filled 2018-02-14: qty 1

## 2018-02-14 MED ORDER — OXYCODONE HCL 5 MG PO TABS
10.0000 mg | ORAL_TABLET | ORAL | Status: DC | PRN
  Administered 2018-02-14 – 2018-02-15 (×3): 10 mg via ORAL
  Filled 2018-02-14 (×3): qty 2

## 2018-02-14 MED ORDER — TRAMADOL HCL 50 MG PO TABS: 50.0000 mg | ORAL_TABLET | Freq: Four times a day (QID) | ORAL | Status: DC | PRN

## 2018-02-14 MED ORDER — OXYCODONE HCL 5 MG PO TABS: 10.0000 mg | ORAL_TABLET | ORAL | Status: DC | PRN

## 2018-02-14 MED ORDER — SENNA 8.6 MG PO TABS: 1.0000 | ORAL_TABLET | Freq: Every day | ORAL | Status: DC

## 2018-02-14 MED ORDER — LIDOCAINE 5 % EX PTCH: 1.0000 | MEDICATED_PATCH | CUTANEOUS | Status: DC

## 2018-02-14 MED ORDER — OXYCODONE HCL 5 MG PO TABS
10.0000 mg | ORAL_TABLET | ORAL | Status: DC | PRN
  Administered 2018-02-14 (×2): 10 mg via ORAL
  Filled 2018-02-14 (×2): qty 2

## 2018-02-14 MED ORDER — BISACODYL 5 MG PO TBEC
5.0000 mg | DELAYED_RELEASE_TABLET | Freq: Once | ORAL | Status: AC
Start: 2018-02-14 — End: 2018-02-14
  Administered 2018-02-14: 5 mg via ORAL
  Filled 2018-02-14: qty 1

## 2018-02-14 MED ORDER — NICOTINE 21 MG/24HR TD PT24
21.0000 mg | MEDICATED_PATCH | Freq: Every day | TRANSDERMAL | Status: DC
  Administered 2018-02-14: 21 mg via TRANSDERMAL
  Filled 2018-02-14: qty 1

## 2018-02-14 NOTE — Consult Note (Signed)
Consultation Note Date: 02/14/2018   Patient Name: Marcus [name]  DOB: 05/06/1976  MRN: [id number]  Age / Sex: 42 y.o., male  PCP: Patient, No Pcp Per Referring Physician: Caren Griffins, MD  Reason for Consultation: Establishing goals of care, Non pain symptom management, Pain control and Psychosocial/spiritual support  HPI/Patient Profile: 42 y.o. 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 Not a candidate for surgical repair from vascular or cardiothoracic surgery. Now readmitted on 02/02/2018 with worsening SOB. He has had recurrent hospitalizations at [facility] 4/30-5/10 and at [facility] 5/16-6/11, 6/12-6/14, 6/15 to present. Prognosis remains grim.   Clinical Assessment and Goals of Care: I met today with Marcus [name] and no other visitors at bedside. Marcus [name] is pleasant and welcoming but visibly restless in pain and anxious. He also complains of constipation and poor appetite. He has had ongoing low back pain from previous snowboarding accident years ago "but this pain is different." Deep, shooting pain in low back and around abd that will on occasion radiate down his right leg currently 9/10. He feels most of his anxiety is from not being able to smoke (currently has cut back to 1/2 ppd) - nicotine patch added. Also says that he has no appetite the past few days (he has food tray at bedside untouched).   Marcus [name] is very open about his history and current circumstances. He shares that he wants to see his mother and sister again before he dies. He has been in contact with his mother via telephone (she is in [place] and has multiple myeloma herself) but has not spoken with his sister in years. He is saddened that he has never even met  his sister's children. He tells me that he believes his mother understands his poor prognosis and that none of his family thought that he would die before her. He is regretful of his past decisions and "what I have done to my body." He tells me that he has been sober for 3 months now and I encouraged him to celebrate this as a victory and the strength it takes to be able to be in control at the end of his life and not be controlled by the drugs. We discussed his goals and he shares that he fears that this (the pain and suffering) will only get worse and this scares him. We discussed that although we cannot make him better medically we can help to make him feel better and minimize his pain and suffering. We were able to talk about different avenues of hospice (including hospice facility which I believe would be his best option) and hospice goals of managing symptoms and quality of life at the end of life. He is interested in hospice.   Our conversation today was somewhat limited by his discomfort so we focused on symptom management. I recommend consideration of methadone (stopping Suboxone) for pain management with the benefit of  aiding in his sobriety as well. He says that he will think about this.  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 He enjoyed surfing, snowboarding, scuba diving, and motor cross. He grew up surfing his entire life in [place]. Has struggled with drug abuse for the past ~20 yrs but has remained sobriety with only tobacco abuse over the past 3 months. He grieves with regret of his decisions and the life that he could have had.    Primary Decision Maker PATIENT    SUMMARY OF RECOMMENDATIONS   - DNR - Good candidate for hospice services (hospice facility would be ideal) - Will need to continue [place] conversations as far as aggressiveness of care desired but he was already overwhelmed today but does seem to be trying to come to  acceptance that he is at EOL - I believe he would benefit from methadone for pain management  Code Status/Advance Care Planning:  DNR   Symptom Management:   Pain low back and into abd: Stop Suboxone. OxyIR 10 mg every 3 hours prn to begin. Lidoderm patch to low back. Consider addition of methadone as I believe he will need something long acting.   Constipation: Dulcolax 5 mg po x 1. Senokot daily beginning tomorrow.   Anxiety: Nicotine patch added. He believes this will help his anxiety. Spiritual care consult placed for emotional/spiritual distress at EOL.   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 Also with significant symptom burden impacting functional status. High risk for acute decompensation.   Discharge Planning: To Be Determined. Would benefit most from hospice facility if his aggressiveness of care desires align. His priority being comfort and minimizing suffering do align with hospice care.       Primary Diagnoses: Present on Admission: . Endocarditis of mitral valve . Severe mitral regurgitation . Polysubstance dependence including opioid type drug without complication, episodic abuse ([place]) . Severe episode of recurrent major depressive disorder, with psychotic features ([place]) . Septic embolism ([place]) . Nontraumatic cortical hemorrhage of right cerebral hemisphere ([place]) . Cerebral embolism with cerebral infarction . 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 Tobacco abuse . SOB (shortness of breath) . Cognitive deficits   I have reviewed the medical  record, interviewed the patient and family, and examined the patient. The following aspects are pertinent.  Past Medical History:  Diagnosis Date  . Hepatitis B 12/29/2017   at [facility]  . Hepatitis C   . 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 Streptococcal endocarditis 12/29/2017   at [facility]   Social History   Socioeconomic History  . Marital status: Single    Spouse name: Not on file  . Number of children: Not on file  . Years of education: Not on file  . Highest education level: Not on file  Occupational History  . Occupation: Unemployed  Social Needs  . Financial resource strain: Not on file  . Food insecurity:  Worry: Not on file    Inability: Not on file  . Transportation needs:    Medical: Not on file    Non-medical: Not on file  Tobacco Use  . Smoking status: Current Every Day Smoker    Packs/day: 0.50    Types: Cigarettes  . Smokeless tobacco: Never Used  Substance and Sexual Activity  . Alcohol use: No  . Drug use: Not Currently    Frequency: 7.0 times per week    Types: Methamphetamines, Heroin, IV    Comment: 02/10/2018 pt states "I have been clean for 3 months, I used for 20 years"  . Sexual activity: Not Currently  Lifestyle  . Physical activity:    Days per week: Not on file    Minutes per session: Not on file  . Stress: Not on file  Relationships  . Social connections:    Talks on phone: Not on file    Gets together: Not on file    Attends religious service: Not on file    Active member of club or organization: Not on file    Attends meetings of clubs or organizations: Not on file    Relationship status: Not on file  Other Topics Concern  . Not on file  Social History Narrative   Homeless   Family History  Problem Relation Age of Onset  . Multiple myeloma Mother    Scheduled Meds: . amoxicillin  1,000  mg Oral BID  . buprenorphine-naloxone  1 tablet Sublingual Daily  . buPROPion  150 mg Oral Daily  . docusate sodium  100 mg Oral BID  . sodium chloride flush  3 mL Intravenous Q12H   Continuous Infusions: . sodium chloride     PRN Meds:.sodium chloride, acetaminophen **OR** acetaminophen, Melatonin, ondansetron **OR** ondansetron (ZOFRAN) IV, oxyCODONE, sodium chloride flush, traZODone Allergies  Allergen Reactions  . Bee Venom Anaphylaxis  . Food Hives and Other (See Comments)    grapefruit  . Tylenol [Acetaminophen] Other (See Comments)    Pt states that he is not supposed to take this medication because of his Hep C.    Review of Systems  Constitutional: Positive for activity change, appetite change and fatigue.  Gastrointestinal: Positive for constipation.  Musculoskeletal: Positive for back pain.    Physical Exam  Constitutional: He is oriented to person, place, and time. He appears well-developed. He appears cachectic. He has a sickly appearance.  HENT:  Head: Normocephalic and atraumatic.  Cardiovascular: Normal rate.  Pulmonary/Chest: Effort normal. No accessory muscle usage. No tachypnea. No respiratory distress.  Mild distress at times but seems r/t anxiety and pain/discomfort more so that respiratory strain  Abdominal: Normal appearance.  Neurological: He is alert and oriented to person, place, and time.  Psychiatric: His mood appears anxious. His speech is not rapid and/or pressured. He is not agitated. He exhibits a depressed mood.    Vital Signs: BP 119/80 (BP Location: Left Arm)   Pulse 74   Temp 97.7 F (36.5 C) (Oral)   Resp 16   SpO2 100%  Pain Scale: 0-10   Pain Score: 9    SpO2: SpO2: 100 % O2 Device:SpO2: 100 % O2 Flow Rate: .   IO: Intake/output summary: No intake or output data in the 24 hours ending 02/14/18 1222  LBM: Last BM Date: 02/11/2018 Baseline Weight:   Most recent weight:       Palliative Assessment/Data: 40%     Time  Total: 70 min  Greater than 50%  of  this time was spent counseling and coordinating care related to the above assessment and plan.  Signed by: Vinie Sill, NP Palliative Medicine Team Pager # [phone] (M-F 8a-5p) Team Phone # [phone] (Nights/Weekends)

## 2018-02-14 NOTE — Plan of Care (Signed)

## 2018-02-14 NOTE — Progress Notes (Signed)
PROGRESS NOTE  Marcus [name] ZOX:[id number] DOB: 30-Aug-1976 DOA: Feb 18, 2018 PCP: Patient, No Pcp Per   LOS: 1 day   Brief Narrative / Interim history: This is a very complex 42 year old male with history of HCV, polysubstance abuse, homelessness, hep B, who has had several hospitalizations here and at [facility]  Records at [facility] in [place] reviewed in detail.  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  At that time the patient was found to have vegetation on the posterior leaflet of his mitral valve, and was also found to have a subtotal occlusion of the aorta at the level of inferior mesenteric artery as well as splenic and renal infarcts, treated with Coumadin and antibiotics.  Following that hospitalization he has had a prolonged hospital stay at [facility] from 5/15 until 6/11 2019.  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  Infectious disease was consulted and followed patient while hospitalized.  He was placed on penicillin G and completed a treatment course, followed by placement on suppressive Augmentin.  Course was complicated by intracerebral hemorrhage for which his Coumadin was stopped.  He underwent a TTE which showed a 1.4 cm mobile vegetation on the posterior leaflet of the mitral valve with severe regurgitation.  Vascular surgery was consulted for distal aortoiliac occlusion and felt like patient is not a surgical candidate at this cannot be repaired in the setting of infection.  Thoracic surgery was consulted due to his mitral valve vegetation despite several weeks of IV antibiotics and felt like he is not a candidate for surgery.  He was eventually discharged on 6/11 on suppressive  Augmentin for 6 months with plans for outpatient follow-up.  He was readmitted the following day on 6/12 due to shortness of breath without a clear-cut etiology and it was not felt that patient has pneumonia.  Infectious disease was consulted again and recommended to continue amoxicillin without any other changes to his antibiotic regimen.  He was eventually discharged on 6/14.  He was this time around again readmitted the next day on 6/15 with continued and progressive shortness of breath and inability to perform any activities or walk.  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  There was also noted occlusion of the bilateral common iliac arteries, and focal occlusion of the splenic artery. -As mentioned, vascular surgery deemed him not a surgical candidate, as with known infectious endocarditis is not a candidate for reconstruction because of prosthetic graft would be needed  Disposition -Patient with progressive disease, extensive comorbidities and it appears that he is declining.  He was discharged from this facility couple of times and just after less than 24 hours outside the hospital he had to come right back due to weakness and shortness of breath.  Palliative care has been consulted, appreciate input.  It looks like we may be reaching a comfort based approach -He is estranged from his family however he tells me he was able to reach his mother who currently lives in [place].  He is not sure if she will visit  Polysubstance abuse, opioid use disorder -continue suboxone, initiate palliative input  Elevated liver enzymes -On admission his ALT was 1000, AST 700, bilirubin was 3.0.  This is new.  Right upper quadrant ultrasound unremarkable. -Leukocytosis may be related to congestive hepatopathy versus ischemic injury whether he is blocking now his hepatic artery versus septic emboli.  Supportive treatment at this point, monitor LFTs  Depression -Continue Wellbutrin  Sacral wound, unstageable -Wound care consulted, recommended frequent turning, repositioning to the patient   DVT prophylaxis: SCDs Code Status: DNR Family Communication: no family available Disposition Plan: TBD  Consultants:   Palliative   Procedures:   None   Antimicrobials:  Amoxicillin - chronic    Subjective: -Complains of excruciating left lower back pain  Objective: Vitals:   03/06/18 1500 03-06-2018 1635 06-Mar-2018 2043 02/14/18 0613    BP: 119/83 121/71 121/78 119/80  Pulse: 94 92 90 74  Resp: (!) 26 (!) 26 (!) 24 16  Temp:  98.2 F (36.8 C) 98.4 F (36.9 C) 97.7 F (36.5 C)  TempSrc:  Oral Oral Oral  SpO2: 99% 100% 100% 100%   No intake or output data in the 24 hours ending 02/14/18 1309 There were no vitals filed for this visit.  Examination:  Constitutional: NAD, cachectic appearing male Eyes: lids and conjunctivae normal, no scleral icterus appreciated Neck: normal, supple Respiratory: clear to auscultation bilaterally, no wheezing, no crackles.  Cardiovascular: Regular rate and rhythm, 3/6 holosystolic murmur.  Trace lower extremity edema.   Abdomen: no tenderness. Bowel sounds positive.  Skin: no rashes Neurologic: CN 2-12 grossly intact. Strength 5/5 in all 4.  Psychiatric: Normal judgment and insight. Alert and oriented x 3. Normal mood.    Data Reviewed: I have independently reviewed following labs and imaging studies   CBC: Recent Labs  Lab 02/09/18 0437 02/09/18 2205 2018/03/06 1241  WBC 4.6 7.8 7.2  NEUTROABS  --  5.5  --   HGB 10.5* 11.8* 10.4*  HCT 34.6* 39.5 33.9*  MCV 90.6 91.6 91.4  PLT 289 395 292   Basic Metabolic Panel: Recent Labs  Lab 02/09/18 0437 02/09/18 2209 2018/03/06 1241 02/14/18 0901  NA 137 136 137 138  K 3.8 3.9 3.7 3.8  CL 103 101 105 104  CO2 25 25 23 24   GLUCOSE 96 140* 101* 126*  BUN 9 13 <5* 5*  CREATININE 0.62 0.89  0.59* 0.64  CALCIUM 9.1 9.7 8.8* 9.1   GFR: Estimated Creatinine Clearance: 110.2 mL/min (by C-G formula based on SCr of 0.64 mg/dL). Liver Function Tests: Recent Labs  Lab 02/25/2018 1241 02/14/18 0901  AST 713* 454*  ALT 1,031* 888*  ALKPHOS 268* 269*  BILITOT 3.0* 3.2*  PROT 7.1 7.6  ALBUMIN 2.2* 2.2*   No results for input(s): LIPASE, AMYLASE in the last 168 hours. No results for input(s): AMMONIA in the last 168 hours. Coagulation Profile: Recent Labs  Lab 02/10/18 0216 02/24/2018 1241  INR 1.47 1.28   Cardiac  Enzymes: No results for input(s): CKTOTAL, CKMB, CKMBINDEX, TROPONINI in the last 168 hours. BNP (last 3 results) No results for input(s): PROBNP in the last 8760 hours. HbA1C: No results for input(s): HGBA1C in the last 72 hours. CBG: No results for input(s): GLUCAP in the last 168 hours. Lipid Profile: No results for input(s): CHOL, HDL, LDLCALC, TRIG, CHOLHDL, LDLDIRECT in the last 72 hours. Thyroid Function Tests: No results for input(s): TSH, T4TOTAL, FREET4, T3FREE, THYROIDAB in the last 72 hours. Anemia Panel: No results for input(s): VITAMINB12, FOLATE, FERRITIN, TIBC, IRON, RETICCTPCT in the last 72 hours. Urine analysis:    Component Value Date/Time   COLORURINE AMBER (A) 02/09/2018 2259   APPEARANCEUR CLOUDY (A) 02/09/2018 2259   LABSPEC 1.028 02/09/2018 2259   PHURINE 5.0 02/09/2018 2259   GLUCOSEU NEGATIVE 02/09/2018 2259   HGBUR NEGATIVE 02/09/2018 2259   BILIRUBINUR SMALL (A) 02/09/2018 2259   KETONESUR 5 (A) 02/09/2018 2259   PROTEINUR 30 (A) 02/09/2018 2259   NITRITE NEGATIVE 02/09/2018 2259   LEUKOCYTESUR NEGATIVE 02/09/2018 2259   Sepsis Labs: Invalid input(s): PROCALCITONIN, LACTICIDVEN  Recent Results (from the past 240 hour(s))  Blood culture (routine x 2)     Status: None (Preliminary result)   Collection Time: 02/10/18  1:46 AM  Result Value Ref Range Status   Specimen Description BLOOD LEFT FOREARM  Final   Special Requests   Final    BOTTLES DRAWN AEROBIC AND ANAEROBIC Blood Culture adequate volume   Culture   Final    NO GROWTH 4 DAYS Performed at [facility], [address] [place], [place] [address]    Report Status PENDING  Incomplete  Blood culture (routine x 2)     Status: None (Preliminary result)   Collection Time: 02/10/18  2:16 AM  Result Value Ref Range Status   Specimen Description BLOOD RIGHT HAND  Final   Special Requests   Final    BOTTLES DRAWN AEROBIC AND ANAEROBIC Blood Culture adequate volume   Culture   Final     NO GROWTH 4 DAYS Performed at [facility], [address], [place], [place] [address]    Report Status PENDING  Incomplete      Radiology Studies: Dg Chest 2 View  Result Date: 02/26/2018 CLINICAL DATA:  Shortness of breath. EXAM: CHEST - 2 VIEW COMPARISON:  None FINDINGS: Normal heart size. Increase interstitial lung markings are identified bilaterally. Trace pleural effusions. No airspace opacities identified. Chronic interstitial coarsening throughout both lungs noted. IMPRESSION: Mild pulmonary edema and small pleural effusions. Electronically Signed   By: Signa Kell M.D.   On: 02/14/2018 13:26   US Abdomen Limited Ruq  Result Date: 02/14/2018 CLINICAL DATA:  Elevated LFTs EXAM: ULTRASOUND ABDOMEN LIMITED RIGHT UPPER QUADRANT COMPARISON:  CT 01/21/2018 FINDINGS: Gallbladder: Gallbladder is mildly distended. Small amount of layering sludge. No visible stones, wall thickening or sonographic Murphy sign.  Common bile duct: Diameter: Normal caliber, 3 mm Liver: No focal lesion identified. Within normal limits in parenchymal echogenicity. Portal vein is patent on color Doppler imaging with normal direction of blood flow towards the liver. IMPRESSION: Mild distention of the gallbladder with small amount of layering sludge. No cholelithiasis or evidence of acute cholecystitis. Electronically Signed   By: Charlett NoseKevin  Dover M.D.   On: 02/14/2018 08:52     Scheduled Meds: . amoxicillin  1,000 mg Oral BID  . bisacodyl  5 mg Oral Once  . buprenorphine-naloxone  1 tablet Sublingual Daily  . buPROPion  150 mg Oral Daily  . docusate sodium  100 mg Oral BID  . [START ON 03-28-2018] senna  1 tablet Oral Daily  . sodium chloride flush  3 mL Intravenous Q12H   Continuous Infusions: . sodium chloride      Marcus Pertostin Zlaty Alexa, MD, PhD [facility] Pager [phone]  If 7PM-7AM, please contact night-coverage [email or web address] Password [email or web address] 02/14/2018, 1:09 PM

## 2018-02-14 NOTE — Progress Notes (Signed)
Spiritual Care Consult Acknowledged. I noticed the consult on Sunday. And upon reading into the Pt.'s history felt compelled to visit. I was looking forward to hearing some of the Pt.'s story and whatever was coming up for [name] this afternoon. At time of visit. Pt. Presented with scattered thoughts and reported that he was sleepy. Marcus [name] shared that last couple of days he has not been able to rest and that he was happy that he felt sleepy now.   I look forward to a future conversation with Marcus Harmanana.  Given the EOL conversations and diagnosis the Pt. Has been recently had to be apart of I wonder how he situates himself next to the central messages of dying  I love you Please forgive me  I forgive you Thank You.  Marcus [name] expressed desire for a future visit and Chaplain will follow up.

## 2018-02-15 LAB — COMPREHENSIVE METABOLIC PANEL
ALT: 657 U/L — AB (ref 17–63)
AST: 243 U/L — ABNORMAL HIGH (ref 15–41)
Albumin: 2.4 g/dL — ABNORMAL LOW (ref 3.5–5.0)
Alkaline Phosphatase: 225 U/L — ABNORMAL HIGH (ref 38–126)
Anion gap: 9 (ref 5–15)
BUN: 12 mg/dL (ref 6–20)
CHLORIDE: 101 mmol/L (ref 101–111)
CO2: 25 mmol/L (ref 22–32)
CREATININE: 0.77 mg/dL (ref 0.61–1.24)
Calcium: 8.7 mg/dL — ABNORMAL LOW (ref 8.9–10.3)
Glucose, Bld: 125 mg/dL — ABNORMAL HIGH (ref 65–99)
Potassium: 3.5 mmol/L (ref 3.5–5.1)
Sodium: 135 mmol/L (ref 135–145)
Total Bilirubin: 2.5 mg/dL — ABNORMAL HIGH (ref 0.3–1.2)
Total Protein: 7.8 g/dL (ref 6.5–8.1)

## 2018-02-15 LAB — CULTURE, BLOOD (ROUTINE X 2)
CULTURE: NO GROWTH
Culture: NO GROWTH
SPECIAL REQUESTS: ADEQUATE
SPECIAL REQUESTS: ADEQUATE

## 2018-02-15 LAB — CBC
HCT: 36.9 % — ABNORMAL LOW (ref 39.0–52.0)
Hemoglobin: 11.4 g/dL — ABNORMAL LOW (ref 13.0–17.0)
MCH: 27.9 pg (ref 26.0–34.0)
MCHC: 30.9 g/dL (ref 30.0–36.0)
MCV: 90.2 fL (ref 78.0–100.0)
Platelets: 390 10*3/uL (ref 150–400)
RBC: 4.09 MIL/uL — ABNORMAL LOW (ref 4.22–5.81)
RDW: 19.7 % — ABNORMAL HIGH (ref 11.5–15.5)
WBC: 19.1 10*3/uL — ABNORMAL HIGH (ref 4.0–10.5)

## 2018-02-16 ENCOUNTER — Inpatient Hospital Stay: Payer: Self-pay | Admitting: Internal Medicine

## 2018-03-01 NOTE — Progress Notes (Signed)
Nutrition Brief Note  Chart reviewed. Pt now transitioning to comfort care.  No further nutrition interventions warranted at this time.  Please re-consult as needed.   Shaunee Mulkern A. Mehar Sagen, RD, LDN, CDE Pager: [phone] After hours Pager: [phone]  

## 2018-03-01 NOTE — Progress Notes (Signed)
   02/22/2018 0900  Clinical Encounter Type  Visited With Health care provider  Visit Type Critical Care;Death  Referral From Nurse  Consult/Referral To Chaplain   Responded to a code blue.  Code was canceled as the patient passed.  Physician indicated patient estranged from his family.  [place] in [place].  Will support staff as needed. Chaplain Agustin CreeNewton Junior Huezo

## 2018-03-01 NOTE — Discharge Summary (Signed)
Death Summary  Marcus [name] [id number] DOB: [birth year] DOA: 01/31/2018  PCP: Patient, No Pcp Per  Admit date: 02/27/2018 Date of Death: Nov 17, 2017 Time of Death: 09/10 am  History of present illness:  This is a very complex 42 year old male with history of HCV, polysubstance abuse, homelessness, hep B, who has had several hospitalizations here and at [facility].  Records at [facility] [place] reviewed in detail.  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  At that time the patient was found to have vegetation on the posterior leaflet of his mitral valve, and was also found to have a subtotal occlusion of the aorta at the level of inferior mesenteric artery as well as splenic and renal infarcts, treated with Coumadin and antibiotics.  Following that hospitalization he has had a prolonged hospital stay at [facility] from 5/15 until 6/11 2019.  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  Infectious disease was consulted and followed patient while hospitalized.  He was placed on penicillin G and completed a treatment course, followed by placement on suppressive Augmentin.  Course was complicated by intracerebral hemorrhage for which his Coumadin was stopped.  He underwent a TTE which showed a 1.4 cm mobile vegetation on the posterior leaflet of the mitral valve with severe regurgitation.  Vascular surgery was consulted for distal aortoiliac occlusion and felt like patient is not a surgical candidate at this cannot be repaired in the setting of infection.  Thoracic surgery was consulted due to his mitral valve vegetation despite several weeks of IV antibiotics and felt like he is not a candidate for surgery.   He was eventually discharged on 6/11 on suppressive Augmentin for 6 months with plans for outpatient follow-up.  He was readmitted the following day on 6/12 due to shortness of breath without a clear-cut etiology and it was not felt that patient has pneumonia.  Infectious disease was consulted again and recommended to continue amoxicillin without any other changes to his antibiotic regimen.  He was eventually discharged on 6/14.  He was this time around again readmitted the next day on 6/15 with continued and progressive shortness of breath and inability to perform any activities or walk.  Final Diagnoses:  Patient was admitted again to the hospital this time due to progressive shortness of breath.  Given significant medical problems which cannot be cured, palliative care was consulted, and discussed with patient regarding focusing care towards comfort, pain control, with potential placement to hospice for aggressive symptom management as patient is struggling with breathing as well as having significant pain.  While preparations were underway, patient unfortunately passed away on Nov 17, 2017 at 9:10 AM.  He was DNR.  Streptococcus pneumoniae MV endocarditis -Initially treated at [facility], patient left AMA, and hospitalized here for close to a month status post penicillin, currently on suppressive antibiotics with Augmentin -This is been complicated by septic emboli to the kidneys and spleen.  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  There was also noted occlusion of the bilateral common iliac arteries, and focal occlusion of the splenic artery. -As mentioned, vascular surgery deemed him not a surgical candidate, as with known infectious endocarditis is not a candidate for reconstruction because of prosthetic graft would be needed Polysubstance abuse, opioid use disorder Elevated liver enzymes -On admission his ALT was 1000, AST 700, bilirubin was 3.0.  This is new.  Right upper quadrant ultrasound unremarkable. -Leukocytosis may be related to congestive hepatopathy versus ischemic injury whether he is blocking now his hepatic artery versus septic emboli. Depression Sacral wound, unstageable   The results of significant diagnostics from this hospitalization (including imaging, microbiology, ancillary and laboratory) are listed below for reference.    Significant Diagnostic Studies: Dg Chest 2 View  Result Date: 02-18-18 CLINICAL DATA:  Shortness of breath. EXAM: CHEST - 2 VIEW COMPARISON:  None FINDINGS: Normal heart size. Increase interstitial lung markings are identified bilaterally. Trace pleural effusions. No airspace opacities identified. Chronic interstitial coarsening throughout both lungs noted. IMPRESSION: Mild pulmonary edema and small pleural effusions. Electronically Signed   By: Signa Kell M.D.   On: 02-18-18 13:26   US Abdomen Limited Ruq  Result Date: 02/14/2018 CLINICAL DATA:  Elevated LFTs EXAM: ULTRASOUND ABDOMEN LIMITED RIGHT UPPER QUADRANT COMPARISON:  CT 01/21/2018 FINDINGS: Gallbladder: Gallbladder is mildly distended. Small amount of layering sludge. No visible stones, wall thickening or sonographic Murphy sign. Common bile duct: Diameter: Normal caliber, 3 mm Liver: No focal lesion identified. Within normal limits in parenchymal echogenicity. Portal vein is patent on color Doppler imaging with normal  direction of blood flow towards the liver. IMPRESSION: Mild distention of the gallbladder with small amount of layering sludge. No cholelithiasis or evidence of acute cholecystitis. Electronically Signed   By: Charlett Nose M.D.   On: 02/14/2018 08:52   Microbiology: Recent Results (from the past 240 hour(s))  Blood culture (routine x 2)     Status: None   Collection Time: 02/10/18  1:46 AM  Result Value Ref Range Status   Specimen Description BLOOD LEFT FOREARM  Final   Special Requests   Final    BOTTLES DRAWN AEROBIC AND ANAEROBIC Blood Culture adequate volume   Culture   Final    NO GROWTH 5 DAYS Performed at [facility], [address], [place], [place] [address]    Report Status 01/30/2018 FINAL  Final  Blood culture (routine x 2)     Status: None   Collection Time: 02/10/18  2:16 AM  Result Value Ref Range Status   Specimen Description BLOOD RIGHT HAND  Final   Special Requests   Final    BOTTLES DRAWN AEROBIC AND ANAEROBIC Blood Culture adequate volume   Culture   Final    NO GROWTH 5 DAYS Performed at [facility], [address] [place], [place] [address]    Report Status 02/01/2018 FINAL  Final     Labs: Basic Metabolic Panel: Recent Labs  Lab 02/09/18 0437 02/09/18 2209 18-Feb-2018 1241 02/14/18 0901 02/10/2018 0345  NA 137 136 137 138 135  K 3.8 3.9 3.7 3.8 3.5  CL 103 101 105 104 101  CO2 25 25 23 24 25   GLUCOSE 96 140* 101* 126* 125*  BUN 9 13 <5* 5* 12  CREATININE 0.62 0.89  0.59* 0.64 0.77  CALCIUM 9.1 9.7 8.8* 9.1 8.7*   Liver Function Tests: Recent Labs  Lab 02/17/2018 1241 02/14/18 0901 03-10-18 0345  AST 713* 454* 243*  ALT 1,031* 888* 657*  ALKPHOS 268* 269* 225*  BILITOT 3.0* 3.2* 2.5*  PROT 7.1 7.6 7.8  ALBUMIN 2.2* 2.2* 2.4*   No results for input(s): LIPASE, AMYLASE in the last 168 hours. No results for input(s): AMMONIA in the last 168 hours. CBC: Recent Labs  Lab 02/09/18 0437 02/09/18 2205 02/01/2018 1241 03/10/18 0345   WBC 4.6 7.8 7.2 19.1*  NEUTROABS  --  5.5  --   --   HGB 10.5* 11.8* 10.4* 11.4*  HCT 34.6* 39.5 33.9* 36.9*  MCV 90.6 91.6 91.4 90.2  PLT 289 395 292 390   Cardiac Enzymes: No results for input(s): CKTOTAL, CKMB, CKMBINDEX, TROPONINI in the last 168 hours. D-Dimer No results for input(s): DDIMER in the last 72 hours. BNP: Invalid input(s): POCBNP CBG: No results for input(s): GLUCAP in the last 168 hours. Anemia work up No results for input(s): VITAMINB12, FOLATE, FERRITIN, TIBC, IRON, RETICCTPCT in the last 72 hours. Urinalysis    Component Value Date/Time   COLORURINE AMBER (A) 02/09/2018 2259   APPEARANCEUR CLOUDY (A) 02/09/2018 2259   LABSPEC 1.028 02/09/2018 2259   PHURINE 5.0 02/09/2018 2259   GLUCOSEU NEGATIVE 02/09/2018 2259   HGBUR NEGATIVE 02/09/2018 2259   BILIRUBINUR SMALL (A) 02/09/2018 2259   KETONESUR 5 (A) 02/09/2018 2259   PROTEINUR 30 (A) 02/09/2018 2259   NITRITE NEGATIVE 02/09/2018 2259   LEUKOCYTESUR NEGATIVE 02/09/2018 2259   Sepsis Labs Invalid input(s): PROCALCITONIN,  WBC,  LACTICIDVEN     SIGNED:  Pamella Pert, MD  [facility] 03/10/2018, 2:12 PM Pager   If 7PM-7AM, please contact night-coverage [email or web address] Password [email or web address]

## 2018-03-01 NOTE — Progress Notes (Addendum)
Palliative:  When I go to visit with Marcus [name] this morning I met Marcus [name] who shares that Marcus [name] has just suddenly died. I called and spoke with mother, Marcus [name] [phone], and shared Marcus [name]'s medical information and that he has unfortunately passed away. Marcus [name] understands but is in a bit of shock and says that this seems surreal. Unfortunately she is in [place] and has not seen him for a long time. She shares that she knew he was very ill but was still hopeful that he would get better. He had shared with her that he was very scared and terrified of hospice. We were able to acknowledge that he is no longer suffering and Marcus [name] says that he has been suffering for years. She wishes to speak with chaplain who met with him yesterday so I will try and reach Marcus [name] to call [facility] to speak. Marcus [name] will speak with her family about arrangements and will call us back once she has time to speak with her family. Emotional support provided.   No charge  Marcus Sill, NP Palliative Medicine Team Pager # [phone] (M-F 8a-5p) Team Phone # [phone] (Nights/Weekends)

## 2018-03-01 DEATH — deceased

## 2018-04-02 ENCOUNTER — Inpatient Hospital Stay: Payer: Self-pay | Admitting: Internal Medicine

## 2019-06-14 IMAGING — DX DG CHEST 2V
2 series · 2 of 2 positions shown · non-contrast
Comparison: None.

CLINICAL DATA: Weakness, hungry, pain.  History of hepatitis-C.

EXAM:
CHEST - 2 VIEW

[w chest pa]
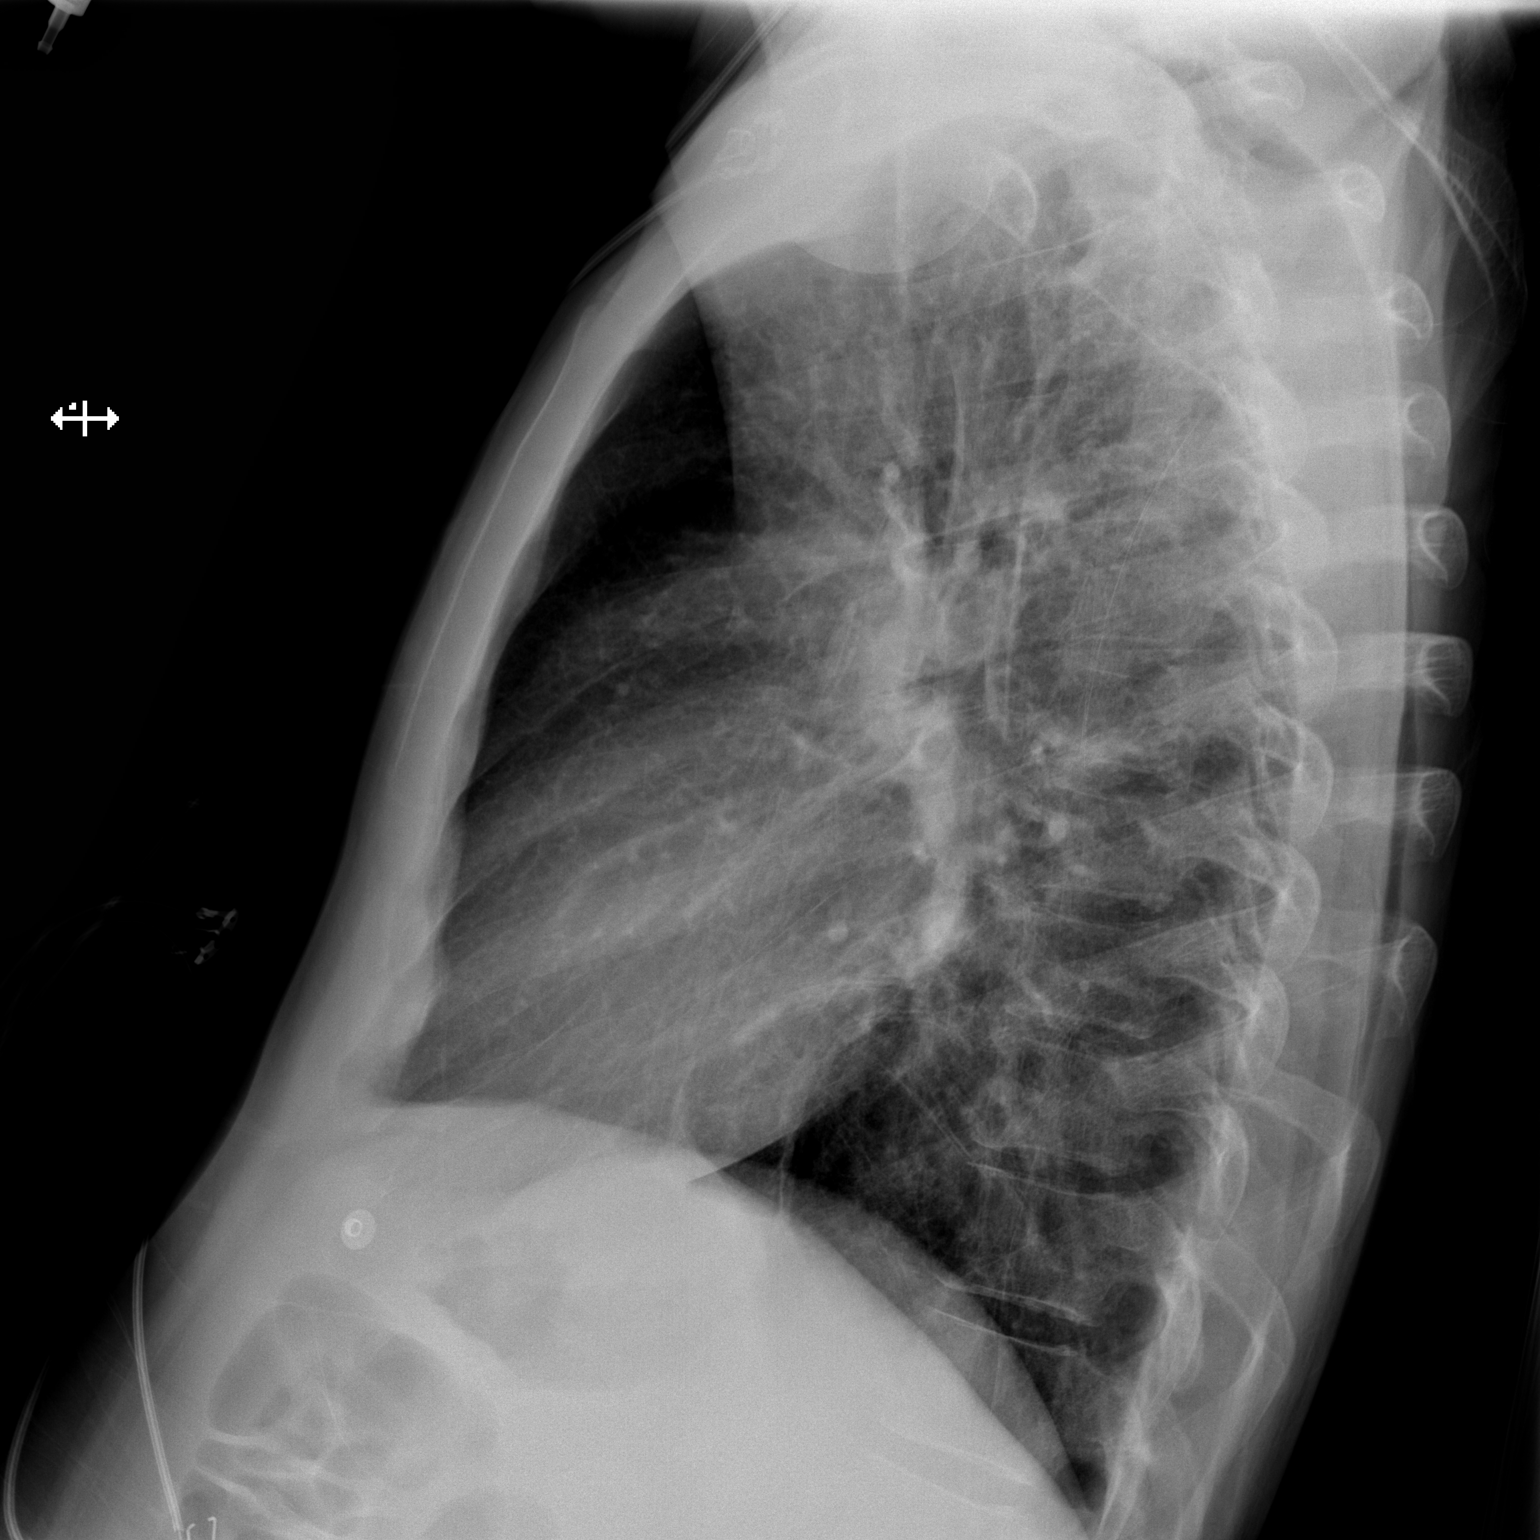

[x chest ap]
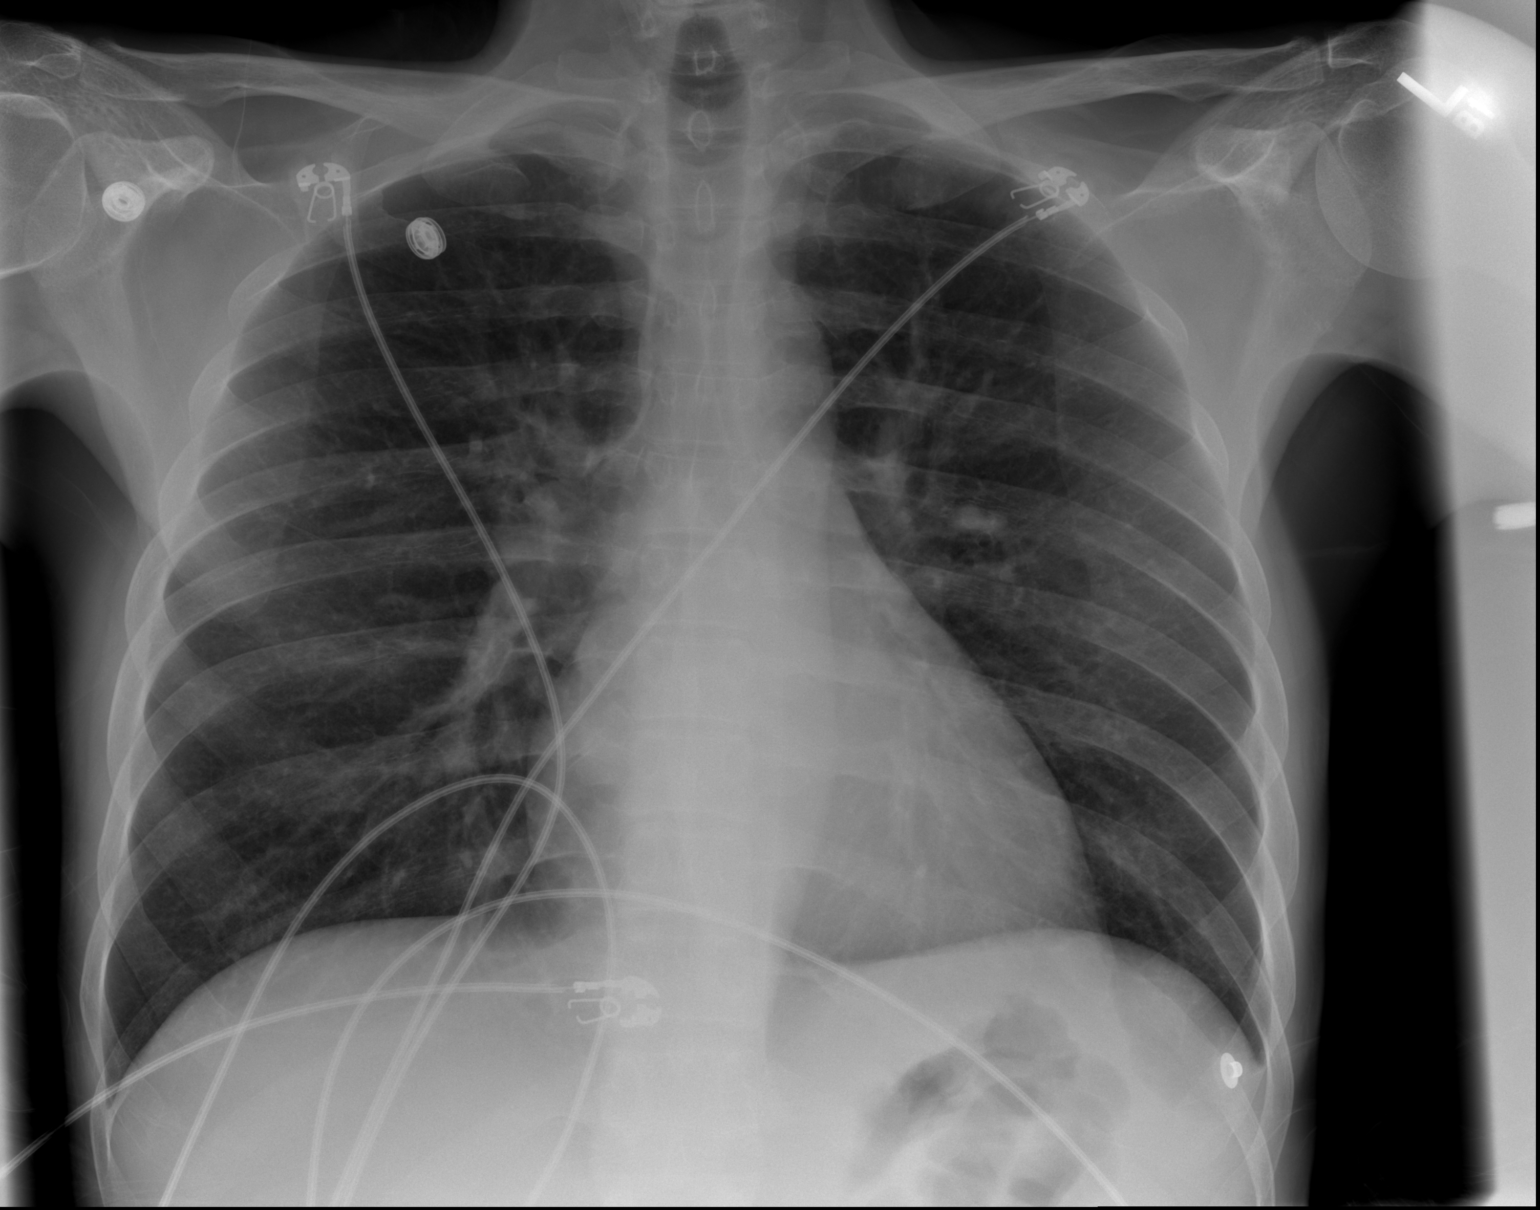

[2 of 2 positions shown; findings below may reference images not displayed]

FINDINGS: The heart size and mediastinal contours are within normal limits.
Both lungs are clear. The visualized skeletal structures are
unremarkable.
IMPRESSION: Negative.

## 2019-06-15 IMAGING — CT CT HEAD W/O CM
4 series · 15 of 47 positions shown, 17 images · non-contrast
Comparison: None.

ADDENDUM:
Study discussed by telephone with Dr. ELISBETH AYADI on 01/14/2018 at
2323 hours.

He advises a history of Endocarditis in this patient.
THEREFORE, the appearance is highly likely to reflect SEPTIC EMBOLI,
with acute embolic related hemorrhage in the right superior frontal
lobe.
CLINICAL DATA: 41-year-old male with altered level consciousness,
lightheadedness and dizzy onset about 1 hour ago.
EXAM:
CT HEAD WITHOUT CONTRAST
TECHNIQUE: Contiguous axial images were obtained from the base of the skull
through the vertex without intravenous contrast.

[Series 3: head wo · axial · 0.42mm/px · z∈[-86,+34]mm · 7 of 32 slices shown, 9 images]
[im 4/32  brain]
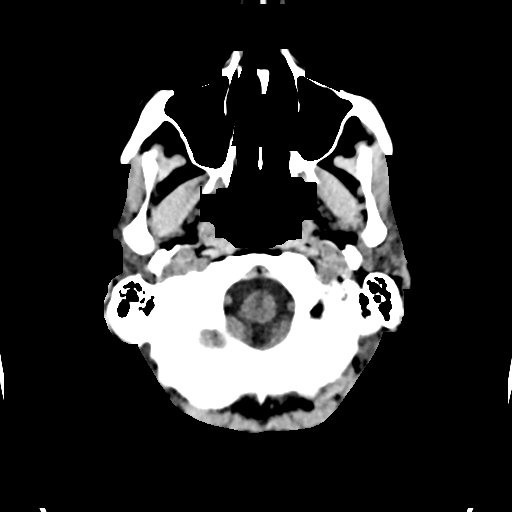
[im 4/32  bone]
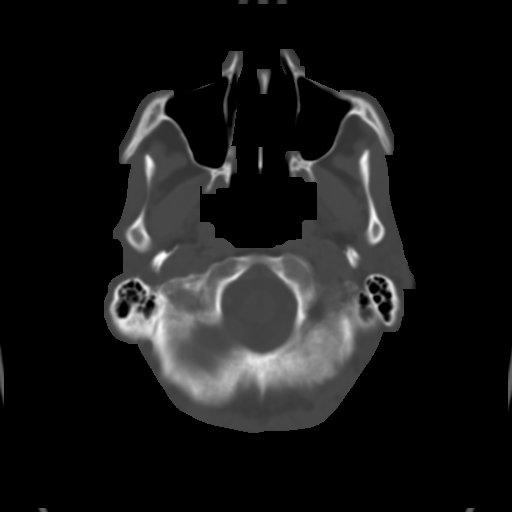
[im 8/32  brain]
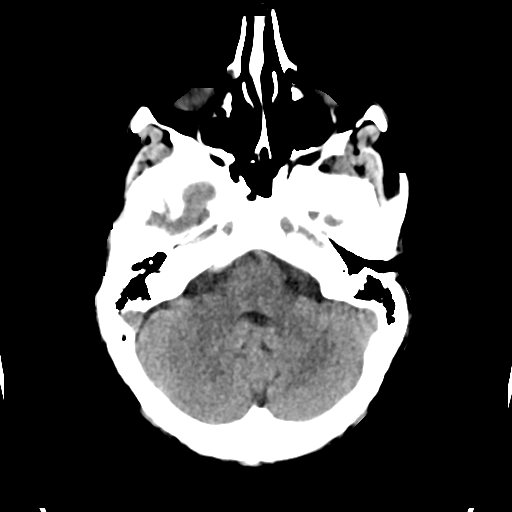
[im 12/32  brain]
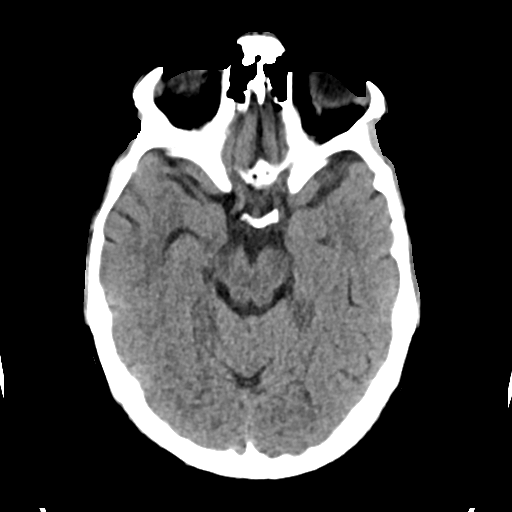
[im 16/32  brain]
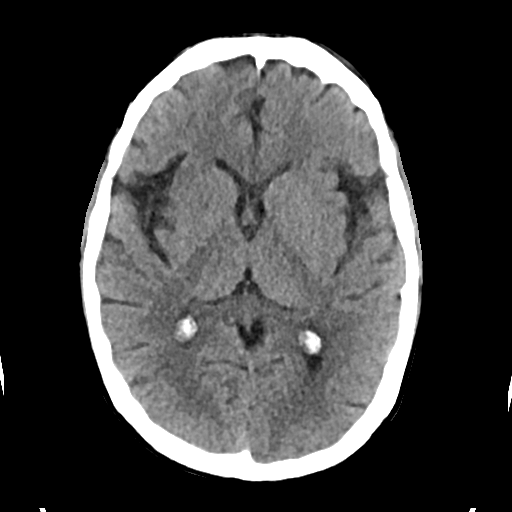
[im 20/32  brain]
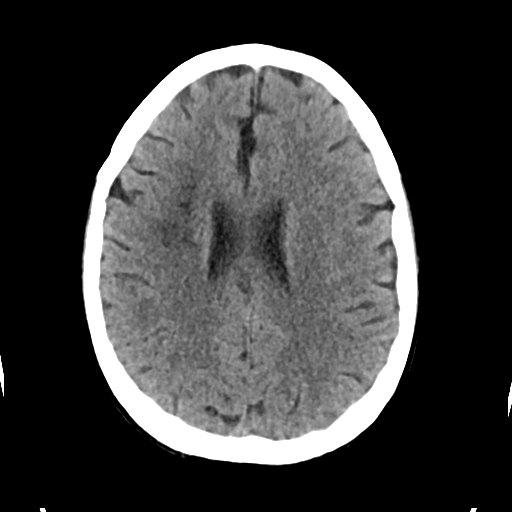
[im 20/32  bone]
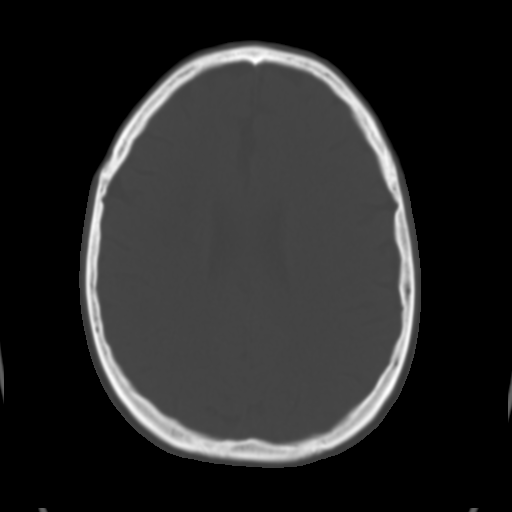
[im 24/32  brain]
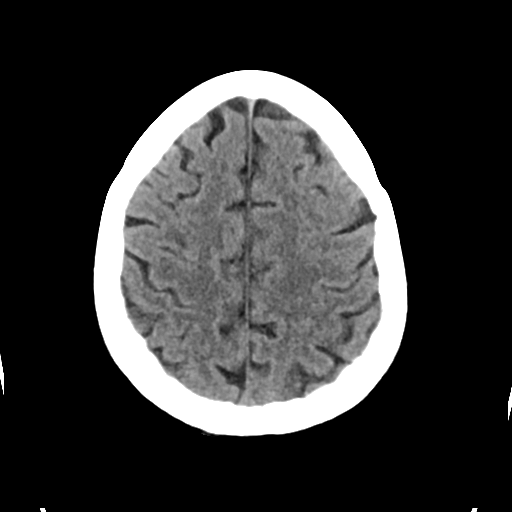
[im 28/32  brain]
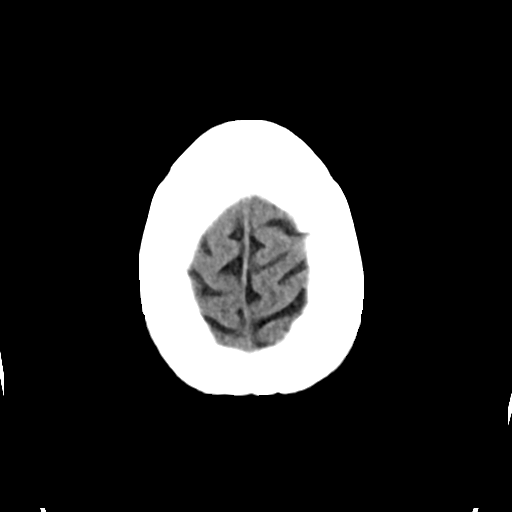

[Series 4: head bone · axial · 0.42mm/px · z∈[-86,-70]mm · 2 of 79 slices shown]
[im 8/79  bone]
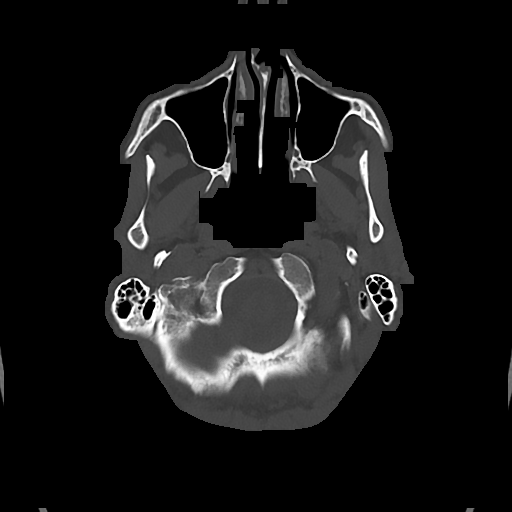
[im 16/79  bone]
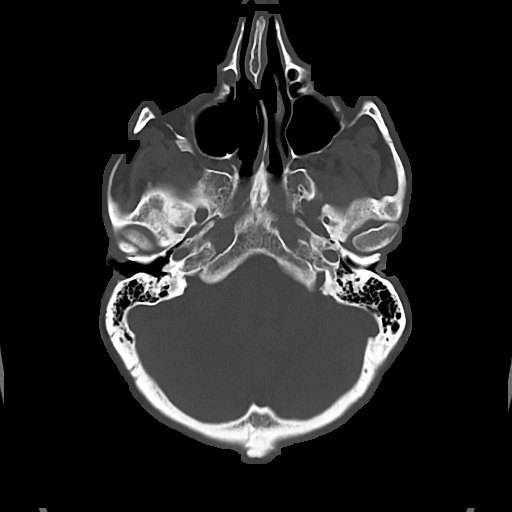

[Series 5: cor soft · coronal · 0.31mm/px · 3 of 74 slices shown]
[im 25/74  brain]
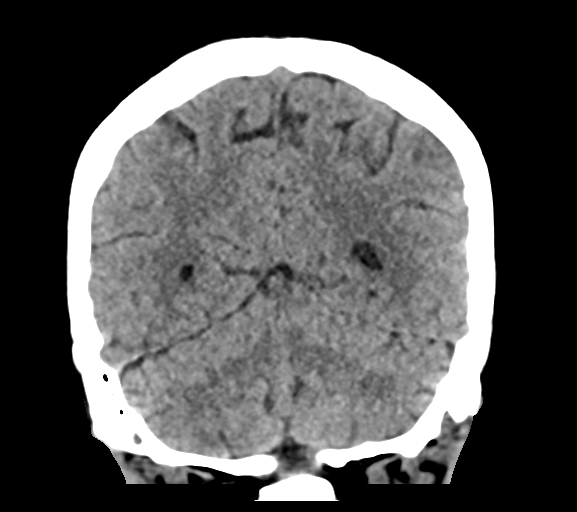
[im 33/74  brain]
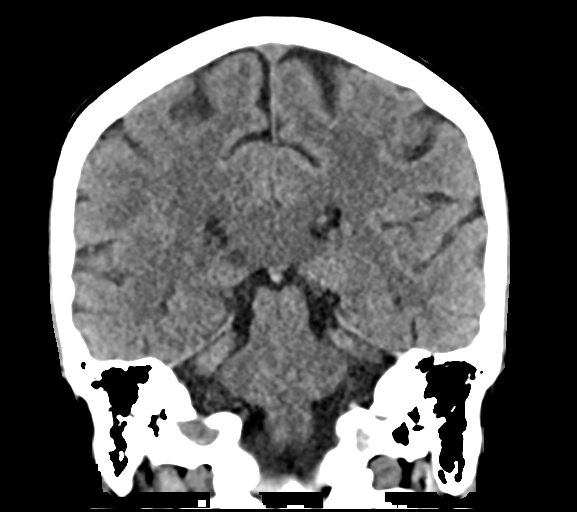
[im 41/74  brain]
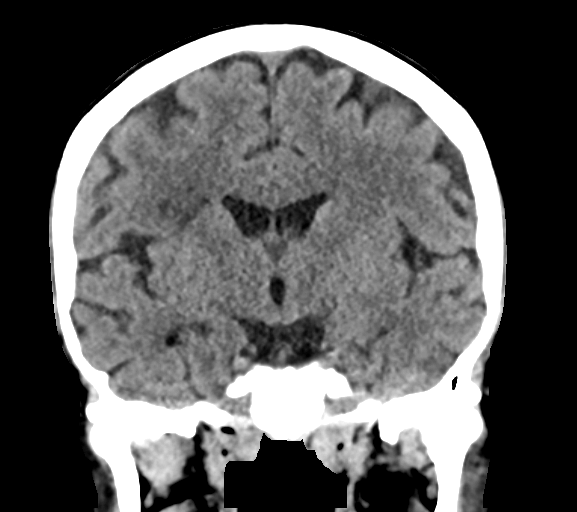

[Series 6: sag soft · sagittal · 0.31mm/px · 3 of 61 slices shown]
[im 21/61  brain]
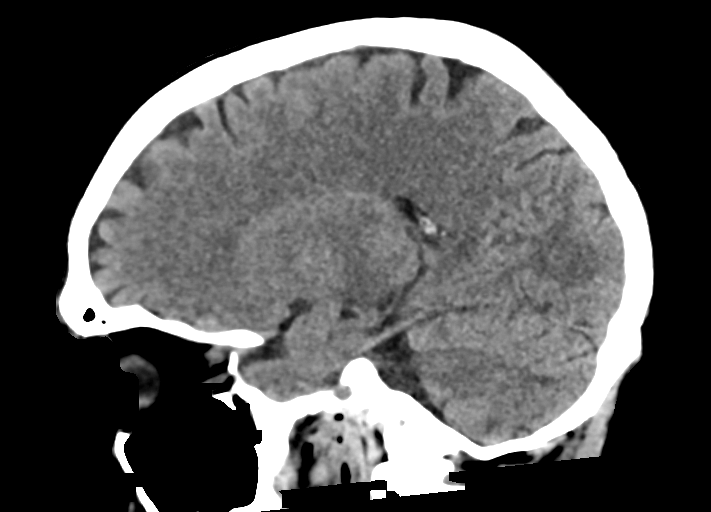
[im 31/61  brain]
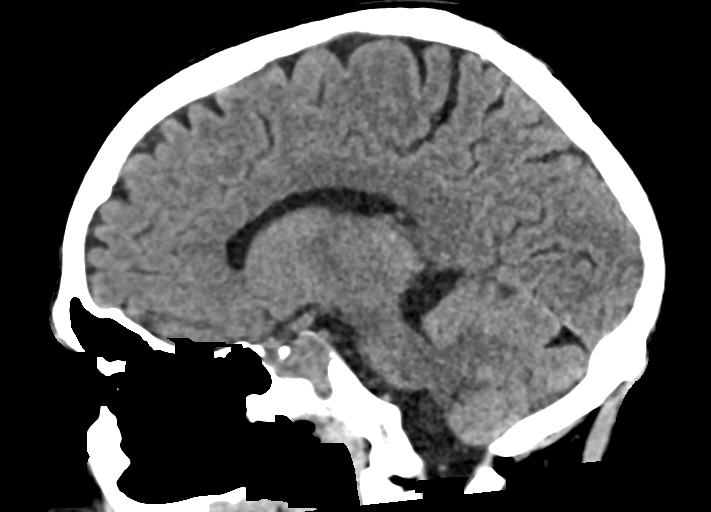
[im 41/61  brain]
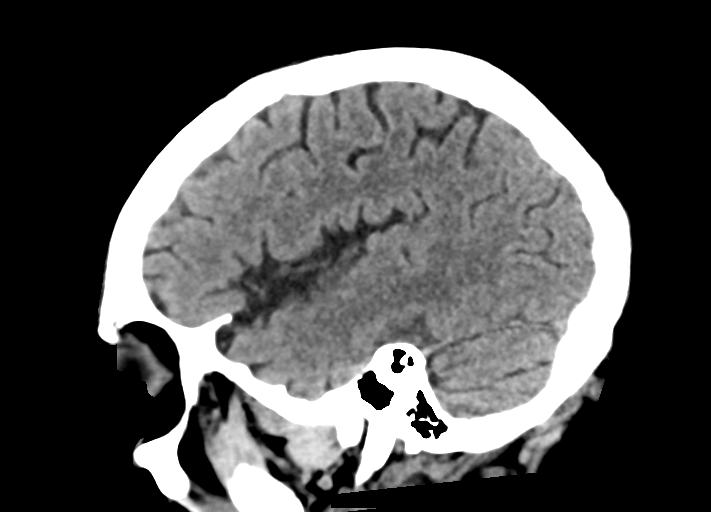

[15 of 47 positions shown; findings below may reference images not displayed]

FINDINGS: Brain: Cytotoxic edema at the right anterior insula and frontal
operculum. No associated hemorrhage or mass effect in those
segments.

Small amorphous 7 millimeter focus of hyperdensity at the right
superior frontal gyrus on series 3, image 25 and sagittal image 26.
On coronal images there is adjacent hypodensity (coronal image 34).
No regional mass effect.

No other cytotoxic edema identified. ASPECTS 7 (abnormal insula, M4,

More chronic appearing left corona radiata white matter hypodensity
on the left.

No intracranial mass effect or midline shift. No ventriculomegaly.
Normal basilar cisterns.

Vascular: Abnormal cylindrical hyperdensity at the right MCA
proximal M2 segment with Vessel expansion (series 3, image 14 and
series 6, image 21).

Skull: No acute osseous abnormality identified.

Sinuses/Orbits: Visualized paranasal sinuses and mastoids are well
pneumatized.

Other: Mildly Disconjugate gaze, otherwise negative orbits soft
tissues. No acute scalp soft tissue findings, scattered benign
appearing scalp dermal nodularity.
IMPRESSION: 1. Evidence of large vessel occlusion at the right MCA proximal M2.
2. Possible trace focus of acute hemorrhage in the right superior
frontal gyrus on series 3, image 25. No other acute intracranial
hemorrhage identified.
3. Right MCA territory cytotoxic edema, ASPECTS 7 (although note
suspicion of trace acute hemorrhage in #2).
4. Chronic appearing left MCA white matter disease.
